# Patient Record
Sex: Female | Born: 1950 | Race: White | Hispanic: No | Smoking: Never smoker
Health system: Southern US, Community
[De-identification: ages and names within clinical notes are randomized; demographics above are authoritative.]

## PROBLEM LIST (undated history)

## (undated) DIAGNOSIS — I10 Essential (primary) hypertension: Secondary | ICD-10-CM

## (undated) DIAGNOSIS — E119 Type 2 diabetes mellitus without complications: Secondary | ICD-10-CM

## (undated) DIAGNOSIS — K222 Esophageal obstruction: Secondary | ICD-10-CM

## (undated) DIAGNOSIS — K219 Gastro-esophageal reflux disease without esophagitis: Secondary | ICD-10-CM

## (undated) DIAGNOSIS — F419 Anxiety disorder, unspecified: Secondary | ICD-10-CM

## (undated) DIAGNOSIS — L719 Rosacea, unspecified: Secondary | ICD-10-CM

## (undated) DIAGNOSIS — K635 Polyp of colon: Secondary | ICD-10-CM

## (undated) DIAGNOSIS — M1990 Unspecified osteoarthritis, unspecified site: Secondary | ICD-10-CM

## (undated) DIAGNOSIS — M810 Age-related osteoporosis without current pathological fracture: Secondary | ICD-10-CM

## (undated) DIAGNOSIS — E785 Hyperlipidemia, unspecified: Secondary | ICD-10-CM

## (undated) DIAGNOSIS — Z8542 Personal history of malignant neoplasm of other parts of uterus: Secondary | ICD-10-CM

## (undated) DIAGNOSIS — R001 Bradycardia, unspecified: Secondary | ICD-10-CM

## (undated) DIAGNOSIS — Z950 Presence of cardiac pacemaker: Secondary | ICD-10-CM

## (undated) HISTORY — DX: Esophageal obstruction: K22.2

## (undated) HISTORY — PX: APPENDECTOMY: SHX54

## (undated) HISTORY — DX: Hyperlipidemia, unspecified: E78.5

## (undated) HISTORY — PX: LAPAROSCOPIC TOTAL HYSTERECTOMY: SUR800

## (undated) HISTORY — DX: Polyp of colon: K63.5

## (undated) HISTORY — DX: Age-related osteoporosis without current pathological fracture: M81.0

## (undated) HISTORY — DX: Gastro-esophageal reflux disease without esophagitis: K21.9

## (undated) HISTORY — DX: Rosacea, unspecified: L71.9

## (undated) HISTORY — DX: Personal history of malignant neoplasm of other parts of uterus: Z85.42

## (undated) HISTORY — DX: Anxiety disorder, unspecified: F41.9

## (undated) HISTORY — DX: Presence of cardiac pacemaker: Z95.0

## (undated) HISTORY — DX: Essential (primary) hypertension: I10

## (undated) HISTORY — DX: Type 2 diabetes mellitus without complications: E11.9

## (undated) HISTORY — DX: Bradycardia, unspecified: R00.1

## (undated) HISTORY — DX: Unspecified osteoarthritis, unspecified site: M19.90

## (undated) HISTORY — PX: TUBAL LIGATION: SHX77

## (undated) HISTORY — PX: LYMPH NODE DISSECTION: SHX5087

## (undated) HISTORY — PX: OTHER SURGICAL HISTORY: SHX169

## (undated) HISTORY — PX: CHOLECYSTECTOMY: SHX55

---

## 2000-02-29 ENCOUNTER — Other Ambulatory Visit: Admission: RE | Admit: 2000-02-29 | Discharge: 2000-02-29 | Payer: Self-pay | Admitting: Family Medicine

## 2001-04-23 ENCOUNTER — Other Ambulatory Visit: Admission: RE | Admit: 2001-04-23 | Discharge: 2001-04-23 | Payer: Self-pay | Admitting: Family Medicine

## 2002-09-01 ENCOUNTER — Other Ambulatory Visit: Admission: RE | Admit: 2002-09-01 | Discharge: 2002-09-01 | Payer: Self-pay | Admitting: Family Medicine

## 2004-08-29 ENCOUNTER — Inpatient Hospital Stay (HOSPITAL_COMMUNITY): Admission: AD | Admit: 2004-08-29 | Discharge: 2004-08-31 | Payer: Self-pay | Admitting: Cardiology

## 2004-08-30 ENCOUNTER — Encounter: Payer: Self-pay | Admitting: Cardiovascular Disease

## 2004-08-30 HISTORY — PX: TRANSTHORACIC ECHOCARDIOGRAM: SHX275

## 2005-01-03 ENCOUNTER — Ambulatory Visit: Payer: Self-pay | Admitting: Internal Medicine

## 2005-11-02 ENCOUNTER — Ambulatory Visit: Payer: Self-pay | Admitting: Internal Medicine

## 2006-05-09 ENCOUNTER — Ambulatory Visit: Payer: Self-pay | Admitting: Internal Medicine

## 2006-07-31 ENCOUNTER — Ambulatory Visit: Payer: Self-pay | Admitting: Internal Medicine

## 2006-11-06 ENCOUNTER — Ambulatory Visit: Payer: Self-pay | Admitting: Internal Medicine

## 2007-04-21 ENCOUNTER — Ambulatory Visit: Payer: Self-pay | Admitting: Internal Medicine

## 2007-07-23 ENCOUNTER — Ambulatory Visit: Payer: Self-pay | Admitting: Internal Medicine

## 2007-09-22 ENCOUNTER — Other Ambulatory Visit: Admission: RE | Admit: 2007-09-22 | Discharge: 2007-09-22 | Payer: Self-pay | Admitting: Obstetrics & Gynecology

## 2007-10-15 ENCOUNTER — Ambulatory Visit: Admission: RE | Admit: 2007-10-15 | Discharge: 2007-10-15 | Payer: Self-pay | Admitting: Gynecologic Oncology

## 2007-10-21 ENCOUNTER — Ambulatory Visit: Payer: Self-pay | Admitting: Internal Medicine

## 2007-11-11 ENCOUNTER — Ambulatory Visit (HOSPITAL_COMMUNITY): Admission: RE | Admit: 2007-11-11 | Discharge: 2007-11-12 | Payer: Self-pay | Admitting: Obstetrics & Gynecology

## 2007-11-11 ENCOUNTER — Encounter: Payer: Self-pay | Admitting: Gynecologic Oncology

## 2007-12-02 ENCOUNTER — Ambulatory Visit: Admission: RE | Admit: 2007-12-02 | Discharge: 2007-12-02 | Payer: Self-pay | Admitting: Gynecology

## 2008-01-21 ENCOUNTER — Ambulatory Visit: Payer: Self-pay | Admitting: Internal Medicine

## 2008-02-17 ENCOUNTER — Ambulatory Visit: Admission: RE | Admit: 2008-02-17 | Discharge: 2008-02-17 | Payer: Self-pay | Admitting: Gynecologic Oncology

## 2008-02-17 ENCOUNTER — Encounter: Payer: Self-pay | Admitting: Gynecologic Oncology

## 2008-02-17 ENCOUNTER — Other Ambulatory Visit: Admission: RE | Admit: 2008-02-17 | Discharge: 2008-02-17 | Payer: Self-pay | Admitting: Gynecologic Oncology

## 2008-04-21 ENCOUNTER — Ambulatory Visit: Payer: Self-pay | Admitting: Internal Medicine

## 2008-06-23 ENCOUNTER — Other Ambulatory Visit: Admission: RE | Admit: 2008-06-23 | Discharge: 2008-06-23 | Payer: Self-pay | Admitting: Obstetrics & Gynecology

## 2008-08-18 ENCOUNTER — Ambulatory Visit: Payer: Self-pay | Admitting: Internal Medicine

## 2008-10-19 ENCOUNTER — Other Ambulatory Visit: Admission: RE | Admit: 2008-10-19 | Discharge: 2008-10-19 | Payer: Self-pay | Admitting: Gynecologic Oncology

## 2008-10-19 ENCOUNTER — Encounter: Payer: Self-pay | Admitting: Gynecologic Oncology

## 2008-10-19 ENCOUNTER — Ambulatory Visit: Admission: RE | Admit: 2008-10-19 | Discharge: 2008-10-19 | Payer: Self-pay | Admitting: Gynecologic Oncology

## 2008-11-02 ENCOUNTER — Ambulatory Visit: Payer: Self-pay | Admitting: Internal Medicine

## 2009-02-02 ENCOUNTER — Ambulatory Visit: Payer: Self-pay | Admitting: Internal Medicine

## 2009-03-08 ENCOUNTER — Ambulatory Visit (HOSPITAL_COMMUNITY): Admission: RE | Admit: 2009-03-08 | Discharge: 2009-03-08 | Payer: Self-pay | Admitting: Gastroenterology

## 2009-04-01 ENCOUNTER — Encounter (INDEPENDENT_AMBULATORY_CARE_PROVIDER_SITE_OTHER): Payer: Self-pay

## 2009-05-04 ENCOUNTER — Ambulatory Visit: Payer: Self-pay | Admitting: Internal Medicine

## 2009-05-09 ENCOUNTER — Encounter: Payer: Self-pay | Admitting: Internal Medicine

## 2009-05-12 ENCOUNTER — Other Ambulatory Visit: Admission: RE | Admit: 2009-05-12 | Discharge: 2009-05-12 | Payer: Self-pay | Admitting: Obstetrics & Gynecology

## 2009-08-03 ENCOUNTER — Ambulatory Visit: Payer: Self-pay | Admitting: Internal Medicine

## 2009-08-03 ENCOUNTER — Encounter: Payer: Self-pay | Admitting: Internal Medicine

## 2009-08-23 ENCOUNTER — Encounter: Payer: Self-pay | Admitting: Internal Medicine

## 2009-10-27 ENCOUNTER — Other Ambulatory Visit: Admission: RE | Admit: 2009-10-27 | Discharge: 2009-10-27 | Payer: Self-pay | Admitting: Gynecologic Oncology

## 2009-10-27 ENCOUNTER — Encounter: Payer: Self-pay | Admitting: Gynecologic Oncology

## 2009-10-27 ENCOUNTER — Encounter: Payer: Self-pay | Admitting: Internal Medicine

## 2009-10-27 ENCOUNTER — Ambulatory Visit: Admission: RE | Admit: 2009-10-27 | Discharge: 2009-10-27 | Payer: Self-pay | Admitting: Gynecologic Oncology

## 2009-11-04 ENCOUNTER — Ambulatory Visit: Payer: Self-pay | Admitting: Internal Medicine

## 2009-11-04 DIAGNOSIS — I10 Essential (primary) hypertension: Secondary | ICD-10-CM | POA: Insufficient documentation

## 2009-11-04 DIAGNOSIS — Z950 Presence of cardiac pacemaker: Secondary | ICD-10-CM

## 2009-12-15 ENCOUNTER — Ambulatory Visit (HOSPITAL_COMMUNITY): Admission: RE | Admit: 2009-12-15 | Discharge: 2009-12-15 | Payer: Self-pay | Admitting: Family Medicine

## 2010-02-01 ENCOUNTER — Ambulatory Visit: Payer: Self-pay | Admitting: Internal Medicine

## 2010-02-10 ENCOUNTER — Encounter: Payer: Self-pay | Admitting: Internal Medicine

## 2010-05-03 ENCOUNTER — Ambulatory Visit: Payer: Self-pay | Admitting: Internal Medicine

## 2010-05-16 ENCOUNTER — Other Ambulatory Visit: Admission: RE | Admit: 2010-05-16 | Discharge: 2010-05-16 | Payer: Self-pay | Admitting: Obstetrics & Gynecology

## 2010-05-22 ENCOUNTER — Encounter: Payer: Self-pay | Admitting: Internal Medicine

## 2010-08-03 ENCOUNTER — Ambulatory Visit: Payer: Self-pay | Admitting: Internal Medicine

## 2010-08-30 ENCOUNTER — Encounter: Payer: Self-pay | Admitting: Internal Medicine

## 2010-10-26 ENCOUNTER — Encounter: Payer: Self-pay | Admitting: Internal Medicine

## 2010-10-26 ENCOUNTER — Ambulatory Visit
Admission: RE | Admit: 2010-10-26 | Discharge: 2010-10-26 | Payer: Self-pay | Source: Home / Self Care | Admitting: Gynecologic Oncology

## 2010-11-28 ENCOUNTER — Ambulatory Visit: Payer: Self-pay | Admitting: Internal Medicine

## 2011-01-16 NOTE — Cardiovascular Report (Signed)
Summary: Office Visit Remote   Office Visit Remote   Imported By: Roderic Ovens 06/01/2010 16:14:54  _____________________________________________________________________  External Attachment:    Type:   Image     Comment:   External Document

## 2011-01-16 NOTE — Letter (Signed)
Summary: Remote Device Check  [employer], Main Office  [address]   [place], [place] [address]   Phone: [phone]  Fax: [phone]     August 30, 2010 MRN: [id number]   [facility] [address] [place], [place]  [address]   Dear Ms. Morency,   Your remote transmission was recieved and reviewed by your physician.  All diagnostics were within normal limits for you.   __X____Your next office visit is scheduled for:    November 2011 with Dr Ladona Ridgel. Please call our office to schedule an appointment.    Sincerely,  Vella Kohler

## 2011-01-16 NOTE — Letter (Signed)
Summary: Remote Device Check  [employer], Main Office  [address]   [place], [place] [address]   Phone: [phone]  Fax: [phone]     May 22, 2010 MRN: [id number]   [facility] [address] [address] [place], [place]  [address]   Dear Ms. Adler,   Your remote transmission was recieved and reviewed by your physician.  All diagnostics were within normal limits for you.  __X___Your next transmission is scheduled for:    08-03-2010 .  Please transmit at any time this day.  If you have a wireless device your transmission will be sent automatically.   Sincerely,  Vella Kohler

## 2011-01-16 NOTE — Cardiovascular Report (Signed)
Summary: Office Visit Remote   Office Visit Remote   Imported By: Roderic Ovens 09/05/2010 14:06:11  _____________________________________________________________________  External Attachment:    Type:   Image     Comment:   External Document

## 2011-01-16 NOTE — Letter (Signed)
Summary: Remote Device Check  [employer], Main Office  [address]   [place], [place] [address]   Phone: [phone]  Fax: [phone]     February 10, 2010 MRN: [id number]   [facility] [place] [place], [place]  [address]   Dear Ms. Baysinger,   Your remote transmission was recieved and reviewed by your physician.  All diagnostics were within normal limits for you.  __X___Your next transmission is scheduled for:    May 03, 2010.  Please transmit at any time this day.  If you have a wireless device your transmission will be sent automatically.     Sincerely,  Proofreader

## 2011-01-16 NOTE — Consult Note (Signed)
Summary: [place] Consultation Report  [place] Consultation Report   Imported By: Earl Many 11/07/2010 15:39:29  _____________________________________________________________________  External Attachment:    Type:   Image     Comment:   External Document

## 2011-01-16 NOTE — Cardiovascular Report (Signed)
Summary: Office Visit Remote   Office Visit Remote   Imported By: Roderic Ovens 02/13/2010 11:10:58  _____________________________________________________________________  External Attachment:    Type:   Image     Comment:   External Document

## 2011-01-18 NOTE — Assessment & Plan Note (Signed)
Summary: pc2 medtronic  Medications Added DEXILANT 60 MG CPDR (DEXLANSOPRAZOLE) once daily BYSTOLIC 5 MG TABS (NEBIVOLOL HCL) Take one tablet by mouth once daily. CRESTOR 10 MG TABS (ROSUVASTATIN CALCIUM) Take one tablet by mouth daily.        Visit Type:  Follow-up Primary Provider:  Rudi Heap, MD   History of Present Illness: [name] returns today for followup.  She is a 60 yo woman with a h/o recurrent syncope and HTN who underwent PPM several yrs. ago. She has had no recurrent syncopal episodes since her PPM was placed though she does occaisionally get dizzy and lightheaded.  No c/p or sob. She does note some indigestion when she bends over.  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   Review of Systems  The patient denies chest pain, syncope, dyspnea on exertion, and peripheral edema.    Vital Signs:  Patient profile:   60 year old female Height:      64 inches Weight:      158 pounds BMI:     27.22 Pulse rate:   68 / minute BP sitting:   112 / 60  (right arm)  Vitals Entered By: Laurance Flatten CMA (November 28, 2010 3:15 PM)  Physical Exam  General:  Well developed, well nourished, in no acute distress.  HEENT: normal Neck: supple. No JVD. Carotids 2+ bilaterally no bruits Cor: RRR no rubs, gallops or murmur Lungs: CTA. Well healed PPM incision. Ab: soft, nontender. nondistended. No HSM. Good bowel sounds Ext: warm. no cyanosis, clubbing or edema Neuro: alert and oriented. Grossly nonfocal. affect pleasant    PPM Specifications Following MD:  Lewayne Bunting, MD     PPM Vendor:  Medtronic     PPM Model Number:  [id number]      PPM Serial Number:  [id number] PPM DOI:  08/30/2004     PPM Implanting MD:  Lewayne Bunting, MD  Lead 1    Location: RA     DOI: 08/30/2004     Model #: [id number]     Serial #: [id number]     Status: active Lead 2    Location: RV     DOI: 08/30/2004     Model #: [id number]     Serial #: [id number]     Status: active   Indications:  Syncope; Bradycardia with pauses    PPM Follow Up Remote Check?  No Battery Voltage:  2.76 V     Battery Est. Longevity:  4 years     Pacer Dependent:  No       PPM Device Measurements Atrium  Amplitude: 2.8 mV, Impedance: 408 ohms, Threshold: 0.5 V at 0.4 msec Right Ventricle  Amplitude: 11.20 mV, Impedance: 486 ohms, Threshold: 0.75 V at 0.4 msec  Episodes MS Episodes:  0     Percent Mode Switch:  0     Coumadin:  No Ventricular High Rate:  3     Atrial Pacing:  0.2%     Ventricular Pacing:  0.3%  Parameters Mode:  DDD     Lower Rate Limit:  50     Upper Rate Limit:  1340 Paced AV Delay:  200     Sensed AV Delay:  200 Next Remote Date:  03/01/2011     Next Cardiology Appt Due:  11/17/2011 Tech Comments:  No parameter changes.  Device function normal.   3VHR episodes 2-3 seconds, SVT.  [facility] transmissions every 3 months.  ROV 1 year with Dr. Ladona Ridgel.   C hecked by industry. Altha Harm, LPN  November 28, 2010 3:22 PM  MD Comments:  Agree with above.   Impression & Recommendations:  Problem # 1:  CARDIAC PACEMAKER IN SITU (ICD-V45.01) Her device is working normally. Will recheck in several months.  Problem # 2:  ESSENTIAL HYPERTENSION, BENIGN (ICD-401.1) Her pressure is well controlled.  continue meds as below and maintain a low sodium diet. Her updated medication list for this problem includes:    Bystolic 5 Mg Tabs (Nebivolol hcl) .Marland Kitchen... Take one tablet by mouth once daily.  Patient Instructions: 1)  Your physician wants you to follow-up in: 12 months with DrTaylor  You will receive a reminder letter in the mail two months in advance. If you don't receive  a letter, please call our office to schedule the follow-up appointment. Prescriptions: BYSTOLIC 5 MG TABS (NEBIVOLOL HCL) Take one tablet by mouth once daily.  #90 x 3   Entered by:   Laurance Flatten CMA   Authorized by:   Laren Boom, MD, [facility]   Signed by:   Laurance Flatten CMA on 11/28/2010   Method used:   Faxed to ...       [facility] (mail-order)             , [place]         Ph: [phone]       Fax: [phone]   RxID:   [id number]

## 2011-01-18 NOTE — Cardiovascular Report (Signed)
Summary: Office Visit   Office Visit   Imported By: Roderic Ovens 12/06/2010 09:28:32  _____________________________________________________________________  External Attachment:    Type:   Image     Comment:   External Document

## 2011-03-01 ENCOUNTER — Encounter: Payer: Self-pay | Admitting: Internal Medicine

## 2011-03-01 ENCOUNTER — Encounter (INDEPENDENT_AMBULATORY_CARE_PROVIDER_SITE_OTHER): Payer: BC Managed Care – PPO

## 2011-03-01 DIAGNOSIS — I498 Other specified cardiac arrhythmias: Secondary | ICD-10-CM

## 2011-03-18 ENCOUNTER — Encounter: Payer: Self-pay | Admitting: *Deleted

## 2011-05-01 NOTE — Op Note (Signed)
[name], [name]                 ACCOUNT NO.:  [id number]   MEDICAL RECORD NO.:  [id number]          PATIENT TYPE:  AMB   LOCATION:  ENDO                         [facility]ACILITY[facility]  [facility]   PHYSICIAN:  Petra Kuba, M.D.    DATE OF BIRTH:  03-Mar-1951   DATE OF PROCEDURE:  03/08/2009  DATE OF DISCHARGE:                               OPERATIVE REPORT   PROCEDURE:  Colonoscopy.   INDICATIONS:  The patient with a history of medium sized polyp with high-  grade dysplasia.  I want to confirm completely removed and no other  lesions.  Consent was signed after the risks, benefits, methods and  options thoroughly discussed multiple times in the past.   MEDICATIONS USED:  Fentanyl 75 mcg, Versed 6 mg.   PROCEDURE:  Rectal inspection as pertinent for external hemorrhoids,  small.  Digital exam was negative.  The video pediatric colonoscope was  inserted and with some difficulty due to a tortuous sigmoid.  With  abdominal pressure it was able to be advanced to the cecum.  No  abnormalities were seen on insertion.  The prep was adequate.  There was  some liquid stool that required washing and suctioning.  The cecum was  identified by the appendiceal orifice and the ileocecal valve.  The  scope was slowly withdrawn.  The cecum and ascending were normal.  In  the transverse, two possible post polypectomy scars were seen without  residual polyp.  One seemed to be a little more proximal and the other  was distal and both appeared normal without signs of residual polyps.  The scope was further withdrawn.  No left-sided abnormalities were seen  as we slowly withdrew back to the rectum.  Once back in the rectum  anorectal posterior and retroflexion confirmed some small hemorrhoids.  The scope was straightened and readvanced a short way up the left side  of the colon.  Air was suctioned and the scope removed.  The patient  tolerated the procedure well.  There was no obvious immediate  complications.   ENDOSCOPIC DIAGNOSES:  1. Internal and external small hemorrhoids.  2. Tortuous sigmoid.  3. Two different areas of scars from probable previous polypectomies      in the transverse without residual polyp.  4. Otherwise within normal limits to the cecum.   PLAN:  I have asked to see him back p.r.n.  Otherwise, repeat colon  screening in 3 years.           ______________________________  Petra Kuba, M.D.     MEM/MEDQ  D:  03/08/2009  T:  03/08/2009  Job:  [id number]   cc:   [name], M.D.  Fax: [phone]

## 2011-05-01 NOTE — Letter (Signed)
October 21, 2007    Cleda Mccreedy, MD  [address]  [place], [place]  [address]   RE:  MAGUIRE, KILLMER  MRN:  [id number]  /  DOB:  18-Feb-1951   Dear Dr. Duard Brady,   Today I saw [name] in my EP clinic for additional followup.  She has a history of bradycardia and a pacemaker implantation and note  from your consultation report from October that she has subsequently  been diagnosed with endometrial cancer.  She is apparently scheduled to  undergo a hysterectomy.  I have known Ms. Strassner for several years having  undergone pacemaker implantation back in 2005.  She has been very stable  and she continues to work a very vigorous [employer] job and has no  chest pain or shortness of breath and tolerates her fairly difficult and  strenuous work without problem.   While there are certainly no risk operations, I think her risk for  cardiovascular complications from your pending hysterectomy are quite  low.  She does have a pacemaker even though she is not pacemaker  dependent.  From my perspective, little if anything need to be done to  her pacemaker prior to the surgery.  You could certainly have the  anesthesiologist place a magnet on this device at the time of surgery  which would minimize any undersensing of the device.  However, because she is not pacemaker dependent this would probably not  be necessary.  At the present time I do not feel that she would benefit  that she would benefit by undergoing additional stress testing and feel  like she can proceed with surgery.  Please do not hesitate to contact me  for any questions regarding Ms. Moritz.    Sincerely,      Doylene Canning. Ladona Ridgel, MD  Electronically Signed    GWT/MedQ  DD: 10/21/2007  DT: 10/22/2007  Job #: [id number]

## 2011-05-01 NOTE — Consult Note (Signed)
[name], [name]                 ACCOUNT NO.:  [id number]   MEDICAL RECORD NO.:  [id number]          PATIENT TYPE:  OUT   LOCATION:  GYN                          FACILITY[facility]  [facility]   PHYSICIAN:  Paola A. Duard Brady, MD    DATE OF BIRTH:  Mar 04, 1951   DATE OF CONSULTATION:  10/15/2007  DATE OF DISCHARGE:                                 CONSULTATION   HISTORY OF PRESENT ILLNESS:  [name] is a 60 year old gravida 1, para 1  whose been menopausal for about 3-4 years.  She had an episode of  vaginal bleeding about a month ago that lasted for about 10 days with  small cramps and small clots.  It felt as if she was having her period,  and that she had anterior thigh pain and low back pain.  She was seen by  Dr. Despina Hidden.  At that time, biopsy was performed that revealed a grade 1  endometrioid adenocarcinoma.  Apparently, at that visit, the patient  also had ultrasounds performed.  I do not have the luxury of those  records as they were not provided for [place] pre-consultation.  She states  that since she since her biopsy, she has continued to have a small  amount of spotting, but has not had as many small clots or the pain that  she had prebiopsy.  She does complain of constipation.  It has been a  longstanding issue.  She will have bowel movements from every day to  every 3 days.  She has never taken any stool softener.  She states it is  not quite that bad.  She denies any narrowing or change in the caliber  her stools or bleeding.  She has never had a colonoscopy.  She does not  know if she has done any stool cards.   PAST MEDICAL HISTORY:  1. She had a pacer put in secondary to 10 second heart positive.  2. She has gastroesophageal reflux disease.   MEDICATIONS:  Nexium, Toprol, Zoloft.   ALLERGIES:  None.   PAST SURGICAL HISTORY:  1. Appendectomy via laparotomy for a rupture.  2. Tonsillectomy.  3. Tubal ligation.  4. NSVD times one.  5. Laparoscopic cholecystectomy.   SOCIAL HISTORY:   She denies tobacco or alcohol.  She is single.  She  works in the [employer].   FAMILY HISTORY:  Her sister had breast cancer in her 57s.   HEALTH MAINTENANCE:  Her mammogram was last 2 years ago.  She had a  recent Pap smear.  She does not know the results.  She has never had a  colonoscopy.   PHYSICAL EXAMINATION:  VITAL SIGNS:  Weight 150 pounds, height 5 feet 4  inches, blood pressure 110/80, well-nourished, well-developed female in  no acute distress.  NECK:  Supple with no lymphadenopathy, no thyromegaly.  LUNGS:  Clear to auscultation bilaterally.  CARDIOVASCULAR:  Regular rate and rhythm.  ABDOMEN:  Shows a well-healed vertical infraumbilical midline incision.  She also has cholecystectomy incisions and an umbilical incision from a  tubal ligation.  Abdomen is soft, nontender,  nondistended.  No palpable  mass or positive megaly.  There is no evidence of any incisional  hernias.  Groins are negative for adenopathy.  EXTREMITIES:  No edema.  PELVIC:  External genitalia is within normal limits.  The vagina is  somewhat atrophic.  The cervix is visualized with a physiologic  discharge.  No visible lesions.  Bimanual examination:  Corpus is  retroflexed.  Cervix is palpably normal.  The corpus of normal size,  shape and consistency.  There are no adnexal masses.   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  Discussion was had with the patient today  regarding need for surgery which she accepts.  We did discuss  laparoscopic surgery and appropriate staging.  She states that an  ultrasound performed at Dr. Forestine Chute office revealed some abnormality, but  she does not recall what it is at this time.  Based on her clinical  examination, I do think that she would be a laparoscopic candidate.  I  discussed with her about that being at the clinic.  They will review our  surgical schedule in triage her surgery so that it can be  done  laparoscopically.  Risks and benefits of surgery including bleeding,  infection, injury to surrounding organs and thromboembolic disease were  discussed with the patient she wishes to proceed.  We will contact her  with dates.  Of note, we will have to insure that she has preoperative  clearance from a cardiac standpoint preoperatively.      Paola A. Duard Brady, MD  Electronically Signed     PAG/MEDQ  D:  10/15/2007  T:  10/15/2007  Job:  [id number]   cc:   Telford Nab, R.N.  [address]  [place], [place] [address]   Doylene Canning. Ladona Ridgel, MD  [address]  [address]  [place]  [place] [address]   Dr. Terrilee Croak, M.D.  Fax: [phone]

## 2011-05-01 NOTE — Assessment & Plan Note (Signed)
[facility]                         ELECTROPHYSIOLOGY OFFICE NOTE   Stephanie, PALMISANO                        MRN:          [id number]  DATE:10/21/2007                            DOB:          [birth year]    [name] returns today for followup.  She is a very pleasant, middle-  aged woman with a history of symptomatic bradycardia and hypertension,  who is status post pacemaker implantation several years ago.  She  returns today for followup.  She has been recently diagnosed with post  __________  vaginal bleeding, and endometrial cancer, and is scheduled  to undergo hysterectomy in the next few weeks.  The patient has done  well, other than her problems with vaginal bleeding.  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  She has had no  syncope.   MEDICATIONS INCLUDE:  1. Zoloft 25 a day.  2. Nexium 40 a day.  3. Toprol 12.5 mg daily.   PHYSICAL EXAM:  Notable for her being a pleasant, middle-aged woman, in  no acute distress.  Her blood pressure today was 140/80, pulse was 84 and regular, the  respirations were 18, the weight was 153 pounds.  HEENT EXAM:  Normocephalic and atraumatic.  Pupils equal and round.  The  oropharynx is moist.  The sclerae are anicteric.  NECK:  Revealed no jugular venous distention.  There is no thyromegaly.  The trachea is midline.  Carotids are 2+ and symmetric.  LUNGS:  Clear bilaterally to auscultation, no wheezes, rales or rhonchi  were present.  There is no increased work of breathing.  CARDIOVASCULAR EXAM:  Revealed a regular rate and rhythm with normal S1  and S2.  There are no murmurs, rubs or gallops present.  EXTREMITIES:  Demonstrated no cyanosis, clubbing or edema.  The pulses  were 2+ and symmetric.   Interrogation of her pacemaker demonstrated a Medtronic Kappa 900 with P  and R-waves of 3 and 8,  respectively.  The impedance is 420 in the  atrium, 490 in the ventricle.  The threshold is 0.5 at 0.4 in both the  atrium and the ventricle.  The battery voltage was 2.78 volts.  There  were no mode-switching episodes.  She had four rate-drop responses.   IMPRESSION:  1. Symptomatic bradycardia.  2. Borderline hypertension.  3. Status post pacemaker insertion.  4. Recently diagnosed endometrial cancer.   DISCUSSION:  Overall, Ms. Aumiller is stable.  I think she is at low  cardiovascular risk for proceeding with hysterectomy.  I will plan to  see her back in the office in a year, sooner should she have additional  problems.     Doylene Canning. Ladona Ridgel, MD  Electronically Signed    GWT/MedQ  DD: 10/21/2007  DT: 10/22/2007  Job #: [id number]   cc:   Rejeana Brock A. Duard Brady, MD  Paulita Cradle, NP

## 2011-05-01 NOTE — Consult Note (Signed)
[name], [name]                 ACCOUNT NO.:  [id number]   MEDICAL RECORD NO.:  [id number]          PATIENT TYPE:  OUT   LOCATION:  GYN                          FACILITY:  [facility]   PHYSICIAN:  De Blanch, M.D.DATE OF BIRTH:  Feb 19, 1951   DATE OF CONSULTATION:  12/02/2007  DATE OF DISCHARGE:                                 CONSULTATION   CHIEF COMPLAINT:  Postoperative follow-up.   INTERVAL HISTORY:  Since hospital discharge, the patient has done well.  She had a bout of flu recently with low grade fever and queasiness to  her stomach.   From a surgical point of view, she reports all of her scars are healing  well and are not very painful.  She had a minimal amount of pink vaginal  discharge last week.  She has no other GI or GU symptoms.   FINAL PATHOLOGY:  Showed that she had a stage IB endometrial cancer.  All lymph nodes and washings and adnexa were negative.   PHYSICAL EXAMINATION:  VITAL SIGNS:  Weight 148 pounds, blood pressure  122/86.  GENERAL:  The patient is healthy white female in no acute distress.  ABDOMEN:  Soft, nontender.  All laparoscopy incisions are healing very  nicely.  PELVIC:  EG/BUS, vagina, bladder, urethra are normal.  Vaginal cuff is  healing well.  On bimanual exam, there is some postoperative induration.  No tenderness and no masses.   IMPRESSION:  Stage IB adenocarcinoma of the endometrium status post  total laparoscopic hysterectomy, bilateral salpingo-oophorectomy and  pelvic lymphadenectomy.  Her prognostic features are good, and no  additional therapy would be recommended.  The patient has been given the  okay to return to work on January 12.  She will return to see [place] in 3  months, and then we will begin alternating visits with Dr. Donia Pounds, M.D.  Electronically Signed     DC/MEDQ  D:  12/02/2007  T:  12/03/2007  Job:  [id number]   cc:   Telford Nab, R.N.  [address]  [place], [place]  [address]   Doylene Canning. Ladona Ridgel, MD  [address]  [address]  [place]  [place] [address]   Birdena Jubilee   Lazaro Arms, M.D.  Fax: [phone]

## 2011-05-01 NOTE — Consult Note (Signed)
NAMEHENREITTA, SPITTLER                 ACCOUNT NO.:  [id number]   MEDICAL RECORD NO.:  [id number]          PATIENT TYPE:  OUT   LOCATION:  GYN                          FACILITY:  [facility]   PHYSICIAN:  Paola A. Duard Brady, MD    DATE OF BIRTH:  1951/03/17   DATE OF CONSULTATION:  10/19/2008  DATE OF DISCHARGE:                                 CONSULTATION   [name] is a very pleasant 60 year old who underwent laparoscopic BSO  and appropriate staging in November 2008 for a 1B grade 1 endometrioid  adenocarcinoma.  She had less than 10% myometrial invasion, no  lymphovascular space involvement and negative lymph nodes.  I last saw  her in March of 2009 at which time her Pap smear was negative.  There  was a lesion noted that was biopsied that revealed mildly inflamed  granulation tissue.  No malignancy identified.  She states she was seen  by Dr. Despina Hidden in July of 2009 with a negative exam and negative Pap smear  and comes in today for followup.  She is overall doing quite well.  She  really denies any complaints.  She did undergo a colonoscopy as we have  been suggesting since 2008.  She had several polyps.  One was  premalignant.  She has a scheduled followup colonoscopy in March of  2010.  She is very thankful that we were so persistent in asking her to  have her colonoscopy done.  She is otherwise doing quite well.  She  denies any change in her bowel or bladder habits, any nausea, vomiting,  fevers, chills, chest pain, shortness of breath.  She denies any change  in her stool caliber, any vaginal bleeding.   MEDICATIONS:  Are the same.  Lovaza is the only addition added secondary  to high triglycerides.   PHYSICAL EXAMINATION:  Weight 155-1/2 pounds.  Well-nourished, well-  developed female in no acute distress.  NECK:  Supple.  There is no lymphadenopathy, no thyromegaly.  LUNGS:  Clear to auscultation bilaterally.  CARDIOVASCULAR EXAM:  Regular rate and rhythm.  ABDOMEN:  Shows  well-healed surgical incisions.  There is no evidence of  any incisional hernias.  Abdomen is soft, nontender, nondistended.  No  palpable masses or hepatosplenomegaly.  Groins are negative for  adenopathy.  EXTREMITIES:  There is no edema.  PELVIC:  External genitalia is within normal limits.  The vagina is  atrophic.  The vaginal cuff is visualized.  There are no visible  lesions.  ThinPrep Pap was submitted without difficulty.  Bimanual  examination reveals no masses or nodularity.  Rectal confirms.   ASSESSMENT:  60 year old with stage I B grade 1 endometrial carcinoma  who with no evidence of recurrent disease.   PLAN:  To follow up results from her Pap smear from today.  She will see  Dr. Despina Hidden in 6 months and return to see [place] in 1 year.  She was given  reminder cards.      Paola A. Duard Brady, MD  Electronically Signed     PAG/MEDQ  D:  10/19/2008  T:  10/19/2008  Job:  [id number]   cc:   Telford Nab, R.N.  [address]  [place], [place] [address]   Doylene Canning. Ladona Ridgel, MD  [address]  [address]  [place]  [place] [address]   [name], M.D.  Fax: [phone]   Lazaro Arms, M.D.  Fax: [phone]   Paulita Cradle

## 2011-05-01 NOTE — Assessment & Plan Note (Signed)
[facility]                         ELECTROPHYSIOLOGY OFFICE NOTE   [name], [name]                        MRN:          [id number]  DATE:11/02/2008                            DOB:          October 01, 1951    [name] returns today for followup.  She is a very pleasant middle-aged  woman with symptomatic bradycardia, status post pacemaker insertion.  She has a history of pauses of greater than 10 seconds.  She returns  today for followup.  The patient has been stable today.  She denies  chest pain or shortness of breath.  She has had no recurrent syncopal  episodes.   MEDICINES:  1. Zoloft 25 mg daily.  2. Nexium 40 a day.  3. Toprol 12.5 daily.  4. Lovaza 2 tablets daily.  5. Citracal daily.   PHYSICAL EXAMINATION:  GENERAL:  She is a pleasant middle-aged woman in  no distress.  VITAL SIGNS:  Blood pressure was 139/80, the pulse 67 and regular,  respirations were 18, and the weight was 152 pounds.  NECK:  No jugular venous distention.  LUNGS:  Clear bilaterally to auscultation.  No wheezes, rales, or  rhonchi are present.  CARDIOVASCULAR:  Regular rate rhythm.  Normal S1 and S2.  ABDOMEN:  Soft and nontender.  EXTREMITIES:  Demonstrate no edema.   Interrogation of pacemaker demonstrates Medtronic kappa 900.  The P-  waves are 4.  The R-waves are 80.  The impedance was 480 in the atrium  and 486 in the ventricle, the threshold is 0.25 at 0.4 in the A and 0.75  at 0.4 in the V.  She was pacing in the A and in the V, 0.2% of the  time.  Today, we reprogrammed her device to maximize battery longevity  by decreasing her outputs to 1.75 at 0.27 in the A and 2 at 0.4 in the  V.   IMPRESSION:  1. Symptomatic bradycardia.  2. Status post pacemaker.   DISCUSSION:  Overall, [name] is stable.  Her pacemaker is working  normally.  She has had no recurrent syncope.  Her blood pressure is well-  controlled.  I will see her back in the office in 1  year.     [name]. Ladona Ridgel, MD  Electronically Signed    GWT/MedQ  DD: 11/02/2008  DT: 11/03/2008  Job #: [id number]

## 2011-05-01 NOTE — Consult Note (Signed)
[name], [name]                 ACCOUNT NO.:  [id number]   MEDICAL RECORD NO.:  [id number]          PATIENT TYPE:  OUT   LOCATION:  GYN                          FACILITY:  [facility]   PHYSICIAN:  Paola A. Duard Brady, MD    DATE OF BIRTH:  1951/07/27   DATE OF CONSULTATION:  02/17/2008  DATE OF DISCHARGE:                                 CONSULTATION   [name] is a very pleasant, 60 year old who underwent laparoscopic  hysterectomy, BSO and appropriate staging November 2008 for what ended  up being on final pathology a IB1 endometrioid adenocarcinoma with 0/9  negative lymph nodes.  Tumor had less than 10% myometrial invasion with  no lymphovascular space involvement noted.  She was seen by Dr. Kemper DurieSharol Given December 16 for a postoperative check at which time there was  some normal postoperative induration but no tenderness or masses.  She  comes in today for follow-up.  She is overall doing quite well and  denies any complaints.   REVIEW OF SYSTEMS:  She has any chest pain, shortness of breath, nausea,  vomiting, fevers, chills, headaches, visual changes.  She denies any  significant change in her bowel or bladder habits, any abdominal  bloating, pain, or vaginal bleeding.   Medication list is reviewed and includes Nexium, Toprol and Zoloft.   HEALTH MAINTENANCE:  She has not yet had a mammogram. Her last one was  almost 3 years ago. She states she will start doing them at work. She  has never had a colonoscopy.   PHYSICAL EXAMINATION:  Weight 150 pounds, blood pressure 134/80, height  5 foot 4, well-nourished, well-developed female in no acute distress.  NECK:  Supple. There is no lymphadenopathy, no thyromegaly.  LUNGS:  Clear to auscultation bilaterally.  CARDIOVASCULAR:  Regular rate and rhythm.  ABDOMEN:  Shows well-healed laparoscopy skin incisions.  Abdomen is  soft, nontender, nondistended. There are no palpable masses or  hepatosplenomegaly. Groins are negative for  adenopathy.  EXTREMITIES:  There is no edema.  PELVIC:  External genitalia is within normal limits.  The vagina is  slightly atrophic.  The vaginal cuff is visualized.  There is a small  area of granulation tissue on the left side of the vaginal cuff.  After  obtaining the patient's verbal consent, the area was biopsied away with  a tissue biopsy forceps.  Silver nitrate was applied.  Bimanual  examination reveals no masses or nodularity.  Rectal confirms.   ASSESSMENT:  A 56-year with a stage I B grade 1 endometrial carcinoma  who has no clinical evidence of recurrent disease.  It does appear to me  that she has granulation tissue at the top of the vagina.   PLAN:  1. Will followup on the results of her Pap smear from today. Will also      send the small area of granulation tissue that was biopsied away to      pathology. Will notify the patient's of the results. Provided this      is all normal, she      return to  see Dr. Despina Hidden in 4 months and return to see [place] in 8      months.  2. She will discuss routine colon cancer screening with Dr. Christell Constant.      She sees Paulita Cradle at that office.  Her questions were      elicited and answered to her satisfaction. She will return as      above.      Paola A. Duard Brady, MD  Electronically Signed     PAG/MEDQ  D:  02/17/2008  T:  02/18/2008  Job:  [id number]   cc:   Telford Nab, R.N.  [address]  [place], [place] [address]   Doylene Canning. Ladona Ridgel, MD  [address]  [address]  [place]  [place] [address]   [name], M.D.  Fax: [phone]   Lazaro Arms, M.D.  Fax: [phone]   Paulita Cradle, NP

## 2011-05-01 NOTE — Op Note (Signed)
[name], [name]                 ACCOUNT NO.:  [id number]   MEDICAL RECORD NO.:  [id number]          PATIENT TYPE:  OIB   LOCATION:  0098                         FACILITY:  [facility]   PHYSICIAN:  Paola A. Duard Brady, MD    DATE OF BIRTH:  [birth year]   DATE OF PROCEDURE:  11/11/2007  DATE OF DISCHARGE:                               OPERATIVE REPORT   PREOPERATIVE DIAGNOSIS:  Grade 1 endometrial carcinoma.   POSTOPERATIVE DIAGNOSIS:  Grade 1 endometrial carcinoma.   PROCEDURE:  Total laparoscopic hysterectomy, BSO, bilateral pelvic and  right-sided periaortic lymph node dissection.   SURGEON:  Paola A. Duard Brady, MD and Roseanna Rainbow, MD.   ASSISTANT:  None.   ANESTHESIA:  General.   ESTIMATED BLOOD LOSS:  75 mL.   URINE OUTPUT:  425 mL.   IV FLUIDS:  2500 ounces.   SPECIMENS:  Washings, cervix, uterus, bilateral tubes and ovaries,  bilateral pelvic and right-sided periaortic lymph nodes.   DISPOSITION OF PATHOLOGY SPECIMEN:  To pathology.   OPERATIVE FINDINGS:  Small bowel densely adherent to the anterior  abdominal wall.  The right ovary with a small 2 cm simple cyst densely  adherent to the ovarian fossae, a 2-cm anterior myoma.  Adhesive disease  of the bladder to the surface of the uterus and abdominal wall.  Otherwise normal anatomy and no evidence of any extrauterine spread.   The patient was taken to the operating room, placed in supine position.  The arms were tucked to her side with appropriate precautions and to the  patient's comfort.  General anesthesia was induced.  Intubation was very  difficult secondary to the patient having a short chin and small mouth.  Anesthesia will send the patient a note regarding this, as well as she  will be asked to wear a Med Alert bracelet. After general anesthesia was  induced and intubation was successfully performed, the patient was  placed in the dorsal lithotomy position with all appropriate  precautions.  The abdomen  was prepped in the usual sterile fashion, the  perineum and vagina were prepped in the usual sterile fashion.  A Foley  catheter was inserted into the bladder under sterile conditions.  A  sterile speculum was placed into the vagina.  The cervix was identified,  it was grasped with a single-tooth tenaculum. The cervix was dilated  without difficulty. The ZUMI with the small KOH-ring was placed in the  usual fashion.  The patient was then draped. Time-out was performed to  confirm surgeons procedure, allergies and antibiotic status.   Local 0.25% plain Marcaine was injected into her prior transverse  infraumbilical incision.  The skin incision made with a knife and  carried down to the underlying fascia using curved Mayo's.  The fascia  was identified, grasped with Kocher clamps, tented and entered.  The  fascial edges were secured with a #0 Vicryl on a UR-6. The Hasson was  then placed into the abdomen.  The abdomen was then insufflated with CO2  gas.  At this point and all points throughout case, the patient's peak  pressures increased over 15 mmHg.  Under direct visualization, a 5-mm  trocar was placed 2 cm above and medial to the ASIS.  A 10-mm trocar was  placed in the suprapubic region, again under direct visualization.  Using careful dissection with sharp scissors, the small bowel adhesive  to the anterior abdominal wall was taken down without difficulty.  A  small fascial defect was created in order to not injure the small bowel.  The small bowel was then allowed to fall  into its normal anatomic  position.  A second 5-mm trocar was placed in the right lower quadrant  again under direct visualization.  Washings were obtained.   The small bowel was folded upon its mesentery.  The root of the aorta  and the small bowel mesentery were identified.  A peritoneal incision  was made overlying the right common iliac artery ascending towards  duodenum.  The retroperitoneal spaces were  identified.  The  retroperitoneal resection was performed to the psoas muscles and the  psoas tendon was identified.  The ureter was identified and mobilized  out of the surgical field.  Using pinpoint cautery and sharp dissection,  the nodal bundle encompassing the right periaortic lymph nodes was  removed.  It was then removed from the 10/12 port site.  The area was  noted to be hemostatic.   Our attention was then drawn to the pelvis.  The round ligament on the  patient's left side was then cauterized using monopolar cautery.  The  anterior and posterior leaves of the broad ligament were opened.  The  ureter was identified, a window was made between the IP and the ureter.  The IP was coagulated and transected. The posterior peritoneum was then  taken down to level of the KOH-ring and the uterine artery was  skeletonized posteriorly.  We then proceeded anteriorly.  The adhesive  disease of the bladder to the surface of the fibroid and the surface of  the uterus as well as the adhesive disease of the bladder to the  anterior abdominal wall were taken down with meticulous sharp  dissection.  The bladder flap was then created to a level below the KOH-  ring. Again skeletonization of the uterine vessels anteriorly was  performed.  The uterine vessels on the patient's left side were then  coagulated using Kleppinger's.  Our attention was then drawn to the  patient's right side in a similar fashion, the round ligament was  cauterized and transected. The anterior and posterior leaves of the  broad ligament were then opened.  The ureter was identified and a window  was made between the IP and the ureter.  The IP was coagulated x2 using  bipolar cautery and transected. The ovary and fallopian tube were  densely adherent to the ovarian fossa.  Sharp dissection was taken to  take this off the peritoneum to the level of the KOH-ring.  The ureter  was noted to be well lateral and inferior to the  area of dissection.  We  then completed the bladder flap anteriorly using sharp dissection.  The  uterine vessels were then skeletonized.  They were then transected.  The  pneumo balloon was then insufflated with 120 mL of air.  The colpotomy  was performed in the usual fashion.  The uterus, cervix, tubes and  ovaries were then delivered through the vagina.   The vaginal cuff was closed using four figure-of-eight sutures of #0  Vicryl in an Endostitch. Our attention  was then drawn onto the right  pelvic sidewall.  The paravesical space was then opened in the usual  fashion as was the pararectal space.  Similarly on the left side, the  paravesical space was then opened.  The nodal bundle extending over the  external iliac artery and vein was taken down using sharp dissection  from the level of the circumflex iliac to the bifurcation.  The  obturator nerve was then identified and the nodal bundle was removed  from the obturator space. There was one slightly enlarged node measuring  approximately a centimeter and half.  The nodal basin was noted to be  hemostatic. Our attention was then drawn to the patient's right pelvic  sidewall.  In a similar fashion, the nodal extending over the external  iliac artery and vein was taken down.  Bilaterally the general femoral  nerves were noticed and were spared.  The obturator nerve was then  identified and the nodal bundle superior to the obturator nerve was  removed. The specimen was placed in an EndoCatch bag and delivered  through the 10/12 port.  Copious irrigation of the pelvis proceeded.  The vaginal cuff was hemostatic as were all vascular pedicles. There was  no bleeding noted in the nodal basins.  We then reinspected the  periaortic site and again there was hemostasis with no active bleeding.  The small bowel was inspected and there is no bleeding.  All pedicles  were then inspected under low flow and they were noted to be hemostatic.  The  ports were removed under direct visualization. All CO2 gas was  removed from the patient's abdomen. The fascia of the infraumbilical  port was then resecured with a running suture of #0 Vicryl.  A subcu  suture of #0 Vicryl was placed in the suprapubic port.  The skin was  closed using 4-0 Vicryl.  Steri-Strips and Band-Aids were placed.   The patient was taken to the recovery room in stable condition.  All  instrument, needle and Ray-Tec counts were correct x2.      Paola A. Duard Brady, MD  Electronically Signed     PAG/MEDQ  D:  11/11/2007  T:  11/11/2007  Job:  [id number]   cc:   Telford Nab, R.N.  [address]  [place], [place] [address]   Doylene Canning. Ladona Ridgel, MD  [address]  [address]  [place]  [place] [address]   Paulita Cradle, NP   Lazaro Arms, M.D.  Fax: [phone]   Roseanna Rainbow, M.D.  Fax: [phone]

## 2011-05-04 NOTE — Consult Note (Signed)
[name], [name]                           ACCOUNT NO.:  [id number]   MEDICAL RECORD NO.:  [id number]                   PATIENT TYPE:  INP   LOCATION:  2910                                 FACILITY:  [facility]   PHYSICIAN:  Doylene Canning. Ladona Ridgel, M.D.               DATE OF BIRTH:  10/11/1951   DATE OF CONSULTATION:  DATE OF DISCHARGE:                                   CONSULTATION   INDICATION FOR CONSULTATION:  Evaluation of recurrent syncope with pauses of  over 10 seconds.   CHIEF COMPLAINT:  I passed out.   HISTORY OF PRESENT ILLNESS:  The patient is a 60 year old woman who has a  history of hypertension and gastroesophageal reflux disease.  She gives a  remote history of syncope occurring several years ago.  This was preceded by  some nausea.  The patient relates over the last several days that she had  initial episode of syncope  approximately two days ago.  She had this  witnessed by her significant other and she was found to be initially  unresponsive with some seizure-like activity.  The patient awakened and felt  nauseated.  The paramedics were called and she declined transfer to the  hospital.  Several hours later she had a recurrent episode of syncope.  This  time, she stated that she was in her usual state of health while lying down  and had a bit of nausea and got up to get some water.  She felt warm all  over and developed a frank syncopal episode, hitting her head with a small  laceration.  She was taken to the emergency department where she was  admitted to the hospital.  Yesterday morning while she was eating breakfast  and lying in bed, she again became fairly nauseated and hot and had an  episode of bradycardia including a 10 second pause.  The nurses who  witnessed this recorded seizure-like activity.  On return of her heart rate  was initially in the 30s, and then gradually came back to normal.  She is  transferred for additional evaluation.  The patient denies any  family  history of sudden cardiac death.  She does not usually have dizziness or  lightheadedness.  She denies chest pain.   PAST MEDICAL HISTORY:  Her past medical history is notable for longstanding  hypertension.  She has a history of appendicitis with gangrene status post  surgical removal and debridement.  She has a history of cholecystectomy.  She has a history of abdominal pain in the past, not associated with  syncope.   FAMILY HISTORY:  Family history is notable for a mother and father both  developing coronary disease in their 62s.  There is no history of heart  rhythm disturbances.   SOCIAL HISTORY:  The patient is presently employed at [employer].  She denies  tobacco or ethanol use.   REVIEW OF  SYSTEMS:  As noted in the HPI.  She denies vision or hearing  problems.  She denies cough, hemoptysis or night sweats.  She denies nausea,  vomiting, diarrhea or constipation except as previously noted.  She denies  arthritic complaints.  She denies any skin changes or neurological problems  except as previously noted.   PHYSICAL EXAMINATION:  VITAL SIGNS:  On physical examination, her blood  pressure was 130/84, pulse was 72 and regular, respirations were 16.  HEENT:  Exam is normocephalic and atraumatic.  Pupils equal and round.  Oropharynx  was moist.  Sclerae anicteric.  NECK:  Reveals no jugular venous distention.  There is no thyromegaly,  trachea was midline.  CARDIOVASCULAR:  Exam revealed a regular rate and rhythm with normal S1 and  S2.  There were no murmurs, rubs or gallops.  LUNGS:  Clear bilaterally to auscultation.  There were no wheezes, rales or  rhonchi.  ABDOMEN:  Exam was soft, nontender, nondistended.  There was no  organomegaly.  EXTREMITIES:  Demonstrated no cyanosis, clubbing or edema.  The pulses were  2+ and symmetric.  NEUROLOGICAL:  Alert and oriented times three with cranial nerves 2-12  grossly intact.  Strength was 5/5 and symmetric.   EKG  demonstrates normal sinus rhythm with normal axis and intervals.   IMPRESSION:  1.  Recurrent syncope with documented profound pauses of up to 10 seconds.  2.  Sinus bradycardia.  3.  Hypertension.  4.  Gastroesophageal reflux disease.  5.  Depression, on Zoloft.   DISCUSSION:  I discussed treatment options with the patient and her family.  One option would be to start beta blocker therapy alone versus beta blocker  therapy in conjunction with a permanent pacemaker.  Because of her very  profound cardiac inhibition, pacemaker insertion would be a consideration in  this patient in conjunction with beta blocker therapy.  Because she has had  these episodes while she is lying down, not only when she is standing, I  think there is a very strong component of cardioinhibitor syncope.  I have  instructed her that permanent pacemaker may not cure her of the problem, but  will hopefully help prevent additional syncopal episodes or at least make  them less severe.  The risk, benefits, goals and expectations of pacemaker  insertion have been discussed with the patient and she wishes to proceed.                                               Doylene Canning. Ladona Ridgel, M.D.    GWT/MEDQ  D:  08/30/2004  T:  08/30/2004  Job:  [id number]   cc:   Carolynn Comment, M.D.   [name], M.D.  [address] [place]  [place] [address]  Fax: [phone]

## 2011-05-04 NOTE — Discharge Summary (Signed)
NAMETAMMATHA, COBB                           ACCOUNT NO.:  [id number]   MEDICAL RECORD NO.:  [id number]                   PATIENT TYPE:  INP   LOCATION:  2910                                 FACILITY:  [facility]   PHYSICIAN:  Jesse Sans. Wall, M.D.                DATE OF BIRTH:  Feb 08, 1951   DATE OF ADMISSION:  08/29/2004  DATE OF DISCHARGE:  08/31/2004                                 DISCHARGE SUMMARY   ADDENDUM   She has also been sent home on Toprol XL 25 mg daily.      Maple Mirza, P.A.                    Thomas C. Wall, M.D.    GM/MEDQ  D:  08/31/2004  T:  08/31/2004  Job:  [id number]   cc:   Doylene Canning. Ladona Ridgel, M.D.   Jonelle Sidle, M.D. [facility]   [name], M.D.  [address] [place]  [place] [address]  Fax: [phone]

## 2011-05-04 NOTE — Op Note (Signed)
NAMESANJUANITA, CONDREY                           ACCOUNT NO.:  [id number]   MEDICAL RECORD NO.:  [id number]                   PATIENT TYPE:  INP   LOCATION:  2910                                 FACILITY:  [facility]   PHYSICIAN:  Doylene Canning. Ladona Ridgel, M.D.               DATE OF BIRTH:  1951-04-05   DATE OF PROCEDURE:  08/30/2004  DATE OF DISCHARGE:                                 OPERATIVE REPORT   PROCEDURE PERFORMED:  Insertion of a dual chamber pacemaker.   INDICATIONS FOR PROCEDURE:  Symptomatic bradycardia with pauses of up to 10  seconds.   I:  INTRODUCTION:  The patient is a 60 year old woman with a history of  recurrent syncope who was admitted to the hospital after having two syncopal  episodes.  During the hospital stay, the patient was noted to have a pause  of 10 seconds associated with convulsive syncope.  She is now referred for  permanent pacemaker insertion.   II:  PROCEDURE:  After informed consent was obtained, the patient was taken  to the diagnostic EP lab in a fasting state.  After the usual preparation  and draping, intravenous Fentanyl and Midazolam were given for sedation.  30  mL of lidocaine was infiltrated into the left infraclavicular region.  A 5  cm incision was carried out over this region and electrocautery utilized to  dissect down to the fascial plane.  The left subclavian vein was then  punctured x 2 and the Medtronic model 5076, 52 cm, active fixation pacing  lead, serial number [id number], was advanced into the right ventricle.  Next, the Medtronic model 5076, 45 cm, active fixation pacing lead, serial  number [id number], was advanced into the right atrium.  Mapping was carried  out in the right ventricle and at the final site near the RV apex, the R  waves measured 9 millivolts and the pacing impedance was 0.6 volts at 0.5  milliseconds with a pacing impedance of 911 ohms.  10 volt pacing in the  right ventricle did not stimulate the diaphragm.  With the  ventricular lead  in satisfactory position, attention was turned to the placement of the  atrial lead.  Mapping was carried out in the right atrium where the final  site P waves measured 2 millivolts and the atrial threshold 0.6 volts at 0.5  milliseconds.  The pacing impedance was again 548 ohms.  Again, 10 volt  pacing did not stimulate the diaphragm.  With both atrial and ventricular  leads in satisfactory position, they were secured to the subpectoralis  fascia with figure-of-eight silk suture.  In addition, the sewing sleeve was  secured with silk suture.  Electrocautery was utilized to make a  subcutaneous pocket.  The Medtronic Kappa 900 model number [id number] was  connected to the atrial and ventricular pacing leads and placed in the  subcutaneous pocket.  The generator was secured with a  silk suture.  Additional Kanamycin irrigation was utilized to irrigate the incision and  the incision was then closed with a layer of 2-0 Vicryl followed by a layer  of 3-0 Vicryl followed by a layer of 4-0 Vicryl.  Benzoin was painted on the  skin, Steri-Strips were applied, and a pressure dressing placed.  The  patient was returned to his room in satisfactory condition.   III:  COMPLICATIONS:  There were no immediate procedure complications.   IV:  RESULTS:  Successful implantation of a Medtronic dual chamber pacemaker  in a patient with symptomatic bradycardia and syncope.                                               Doylene Canning. Ladona Ridgel, M.D.    GWT/MEDQ  D:  08/30/2004  T:  08/30/2004  Job:  [id number]   cc:   Jonelle Sidle, M.D. [facility]   Dr. Christell Constant

## 2011-05-04 NOTE — Discharge Summary (Signed)
NAMEGURTHA, PICKER                           ACCOUNT NO.:  [id number]   MEDICAL RECORD NO.:  [id number]                   PATIENT TYPE:  INP   LOCATION:  2910                                 FACILITY:  [facility]   PHYSICIAN:  Doylene Canning. Ladona Ridgel, M.D.               DATE OF BIRTH:  02-Dec-1951   DATE OF ADMISSION:  08/29/2004  DATE OF DISCHARGE:  08/31/2004                                 DISCHARGE SUMMARY   DISCHARGE DIAGNOSES:  1.  Syncope, most likely neurally mediated with predominance if      cardioinhibition with pauses up to 10 seconds in length.  2.  Recent history of chest pain and dyspnea.   SECONDARY DIAGNOSES:  1.  Hypertension.  2.  Gastroesophageal reflux disease.  3.  Depression.   PROCEDURE:  August 30, 2004, implantation of DDD permanent pacemaker,  Medtronic Kappa [id number], serial number [id number], Dr. Doylene Canning. Ladona Ridgel.   DISCHARGE DISPOSITION:  Ms. Averitt is suitable for discharge, post-procedure  day #1, after implantation of Medtronic dual-chamber pacemaker.  Post-  implantation interrogation shows that all values are within normal limits,  no changes made.  Her incision is healing nicely.  Chest x-ray shows that  leads are appropriate in placement, no pneumothorax and leads are intact.  The patient has had some sinus rhythm with PVCs in the last 16 hours, but no  pauses.  The patient has not had nausea or vomiting and slept well  overnight.  The patient also underwent 2-D echocardiogram, August 30, 2004.  Ejection fraction was 55% to 65%.  Left ventricular function was  normal.  Trivial mitral regurgitation.   DISCHARGE MEDICATIONS:  Patient discharges on the following medications:  1.  Zestril 5 mg daily.  2.  Zoloft 25 mg daily.  3.  Prempro 2.5 mg daily.  4.  Nexium 20 mg daily.  5.  Zyrtec 10 mg daily.  6.  For incisional pain, the patient will take Tylenol 325 mg 1-2 tabs every      4-6 hours as needed.   ACTIVITY:  She is to return to work in 3-4  weeks and she has been given a  permission slip for this.  She will have the following activity for the left  upper extremity and has been described in detail to the patient:  She is  basically to keep her left arm by her side quietly for the next 4 days.  She  is asked not to drive for the next 10 days and not to engage in heavy  lifting or exertion for the next 6-8 weeks.   WOUND CARE:  She is to keep the incision dry for the next week, sponge-bathe  until Wednesday, September 06, 2004, then she can re-commence showering.   FOLLOWUP:  She has followup as recorded below:  1.  [facility], [facility], [address] [place],  9:15 in the morning on Monday, September 18, 2004.  She will have an      exercise stress study at [facility] on Tuesday, September 19, 2004,      at 7:30, nothing to eat after midnight Monday night.  2.  She will have a followup visit with Dr. Jonelle Sidle, [facility] office      of [facility], Thursday, September 21, 2004, at 1:45 in the      afternoon and she will see Dr. Ladona Ridgel, January 03, 2005 at 9:30 in the      morning.   BRIEF HISTORY:  Ms. Ortloff is a 60 year old female.  She has a history of  syncope 3 times since the evening of August 28, 2004; this was over a 24-  hour period.  Each time, the patient felt warm with the warmth localized  to the head, also an overall funny feeling, a drifting, dreamy feeling, then  her eyes rolled back, per witnesses, and she lost consciousness; this  usually would last only about 30 seconds.  The first time was 10 p.m. in the  evening prior to this admission.  She had all the symptoms mentioned above  and followed by nausea and vomiting on recovery of consciousness.  EMS was  called and they told the patient that she probably had a viral illness.  The  patient went back to bed, but woke up at 2 o'clock in the morning on the  morning of this admission.  She got up, went into the kitchen,  again her  head felt warm.  She started to have that drifty feeling, then she fell,  hurting her left elbow.  Once again on recovery, she had nausea and  vomiting; EMS at this time took her to [place].  At 8:55 in the morning at  [facility], lying in bed, she felt a warm, drifty, funny feeling,  lost consciousness; this time, the monitor showed a pause of 10 seconds;  once again, nausea and vomiting following cardiac arrhythmias.  The patient  also gives a history of epigastric-located pain through to the back for the  last month; with this, she has no diaphoresis, nausea or radiation to the  extremities or the neck, no dyspnea.  The pain is not exertional, not  positional, it comes and goes intermittently.  The patient did have a stress  test several years ago which she says was normal.  She had this done in  the setting of chest pain and dyspnea, but at that time, the symptoms  overall spontaneously resolved.  The patient will be transferred to [facility] for evaluation by electrophysiology with diagnosis of syncope  with documented pauses.   HOSPITAL COURSE:  Patient arrived at [facility] on the evening of  August 29, 2004.  She was placed on a monitor.  She had no further  episodes of syncope.  She was seen in consultation by Dr. Ladona Ridgel, who said  that this was most likely a neurally mediated syncope with predominance of  cardioinhibition.  Options including beta blocker therapy or permanent  pacemaker placement were discussed with the patient; she elected to have  permanent pacemaker placement; this was done August 30, 2004.  She had no  complications post-procedurally, no recurrence of syncope.  She is ready for  discharge with the extensive followup as dictated.      Maple Mirza, P.A.  Doylene Canning. Ladona Ridgel, M.D.    GM/MEDQ  D:  08/31/2004  T:  08/31/2004  Job:  [id number]  cc:   Jonelle Sidle, M.D. [facility]   [name],  M.D.  [address] [place]  [place] [address]  Fax: [phone]

## 2011-05-31 ENCOUNTER — Encounter: Payer: Self-pay | Admitting: *Deleted

## 2011-06-04 ENCOUNTER — Encounter: Payer: Self-pay | Admitting: *Deleted

## 2011-06-10 ENCOUNTER — Other Ambulatory Visit: Payer: Self-pay | Admitting: Internal Medicine

## 2011-06-11 ENCOUNTER — Ambulatory Visit (INDEPENDENT_AMBULATORY_CARE_PROVIDER_SITE_OTHER): Payer: BC Managed Care – PPO | Admitting: *Deleted

## 2011-06-11 DIAGNOSIS — R001 Bradycardia, unspecified: Secondary | ICD-10-CM

## 2011-06-11 DIAGNOSIS — Z950 Presence of cardiac pacemaker: Secondary | ICD-10-CM

## 2011-06-11 DIAGNOSIS — I498 Other specified cardiac arrhythmias: Secondary | ICD-10-CM

## 2011-06-11 DIAGNOSIS — R55 Syncope and collapse: Secondary | ICD-10-CM

## 2011-06-19 ENCOUNTER — Encounter: Payer: Self-pay | Admitting: *Deleted

## 2011-06-19 NOTE — Progress Notes (Signed)
Pacer remote check  

## 2011-09-13 ENCOUNTER — Ambulatory Visit (INDEPENDENT_AMBULATORY_CARE_PROVIDER_SITE_OTHER): Payer: BC Managed Care – PPO | Admitting: *Deleted

## 2011-09-13 DIAGNOSIS — R001 Bradycardia, unspecified: Secondary | ICD-10-CM

## 2011-09-13 DIAGNOSIS — I498 Other specified cardiac arrhythmias: Secondary | ICD-10-CM

## 2011-09-13 DIAGNOSIS — Z950 Presence of cardiac pacemaker: Secondary | ICD-10-CM

## 2011-09-13 DIAGNOSIS — R55 Syncope and collapse: Secondary | ICD-10-CM

## 2011-09-14 ENCOUNTER — Other Ambulatory Visit: Payer: Self-pay | Admitting: Internal Medicine

## 2011-09-14 ENCOUNTER — Encounter: Payer: Self-pay | Admitting: Internal Medicine

## 2011-09-17 LAB — REMOTE PACEMAKER DEVICE
AL IMPEDENCE PM: 404 Ohm
ATRIAL PACING PM: 0
BATTERY VOLTAGE: 2.75 V
RV LEAD IMPEDENCE PM: 556 Ohm

## 2011-09-19 ENCOUNTER — Encounter: Payer: Self-pay | Admitting: *Deleted

## 2011-09-20 NOTE — Progress Notes (Signed)
Pacer remote check  

## 2011-09-25 LAB — COMPREHENSIVE METABOLIC PANEL
ALT: 18
AST: 19
Albumin: 3.8
Alkaline Phosphatase: 74
CO2: 30
Calcium: 9.3
Chloride: 106
Glucose, Bld: 104 — ABNORMAL HIGH
Potassium: 3.4 — ABNORMAL LOW
Sodium: 143

## 2011-09-25 LAB — CBC
HCT: 34.5 — ABNORMAL LOW
HCT: 38.6
MCHC: 35.1
MCHC: 35.4
MCV: 90.5
MCV: 90.5
Platelets: 231
RBC: 4.27
WBC: 12.6 — ABNORMAL HIGH

## 2011-09-25 LAB — BASIC METABOLIC PANEL
BUN: 5 — ABNORMAL LOW
Calcium: 8.7
Creatinine, Ser: 0.64
GFR calc non Af Amer: >60
Potassium: 4
Sodium: 141

## 2011-09-25 LAB — TYPE AND SCREEN
ABO/RH(D): O POS
Antibody Screen: NEGATIVE

## 2011-09-25 LAB — ABO/RH: ABO/RH(D): O POS

## 2011-09-25 LAB — DIFFERENTIAL
Basophils Absolute: 0
Basophils Relative: 0
Monocytes Relative: 4
Neutro Abs: 4.8

## 2011-11-21 ENCOUNTER — Encounter: Payer: Self-pay | Admitting: Internal Medicine

## 2011-11-23 ENCOUNTER — Ambulatory Visit (INDEPENDENT_AMBULATORY_CARE_PROVIDER_SITE_OTHER): Payer: BC Managed Care – PPO | Admitting: Internal Medicine

## 2011-11-23 ENCOUNTER — Encounter: Payer: Self-pay | Admitting: Internal Medicine

## 2011-11-23 ENCOUNTER — Encounter (HOSPITAL_COMMUNITY): Payer: BC Managed Care – PPO

## 2011-11-23 VITALS — BP 126/78 | HR 73 | Ht 64.5 in | Wt 160.4 lb

## 2011-11-23 DIAGNOSIS — Z950 Presence of cardiac pacemaker: Secondary | ICD-10-CM

## 2011-11-23 DIAGNOSIS — I10 Essential (primary) hypertension: Secondary | ICD-10-CM

## 2011-11-23 DIAGNOSIS — I459 Conduction disorder, unspecified: Secondary | ICD-10-CM

## 2011-11-23 LAB — PACEMAKER DEVICE OBSERVATION
AL AMPLITUDE: 4 mv
AL IMPEDENCE PM: 394 Ohm
AL THRESHOLD: 0.5 V
ATRIAL PACING PM: 0
BATTERY VOLTAGE: 2.75 V
RV LEAD AMPLITUDE: 15.68 mv
RV LEAD IMPEDENCE PM: 544 Ohm
RV LEAD THRESHOLD: 0.75 V
VENTRICULAR PACING PM: 0

## 2011-11-23 NOTE — Progress Notes (Signed)
HPI [name] returns today for followup. She is a very pleasant 60 year old woman with a history of symptomatic bradycardia, hypertension, and dyslipidemia. She is status post permanent pacemaker insertion. She denies chest pain, shortness of breath, peripheral edema, or syncope. She notes no tenderness or pain in her pacemaker insertion site. She continues to work full-time. No Known Allergies   Current Outpatient Prescriptions  Medication Sig Dispense Refill  . calcium citrate-vitamin D (CITRACAL+D) 315-200 MG-UNIT per tablet Take 1 tablet by mouth daily.        Marland Kitchen dexlansoprazole (DEXILANT) 60 MG capsule Take 60 mg by mouth daily.        . nebivolol (BYSTOLIC) 5 MG tablet Take 5 mg by mouth daily.        Marland Kitchen omega-3 acid ethyl esters (LOVAZA) 1 G capsule Take 2 g by mouth daily.        . rosuvastatin (CRESTOR) 10 MG tablet Take 10 mg by mouth daily.        . sertraline (ZOLOFT) 25 MG tablet Take 25 mg by mouth daily.           Past Medical History  Diagnosis Date  . Symptomatic bradycardia   . History of uterine cancer     ROS:   All systems reviewed and negative except as noted in the HPI.   Past Surgical History  Procedure Date  . Laparoscopic total hysterectomy   . Lymph node dissection     bilateral pelvic and right-sided periaortic  . Status post pacemaker     metronic [phone]  . Transthoracic echocardiogram 08/30/04     No family history on file.   History   Social History  . Marital Status: Single    Spouse Name: N/A    Number of Children: N/A  . Years of Education: N/A   Occupational History  . employed     Scientist, product/process development   Social History Main Topics  . Smoking status: Never Smoker   . Smokeless tobacco: Not on file  . Alcohol Use: No  . Drug Use: Not on file  . Sexually Active: Not on file   Other Topics Concern  . Not on file   Social History Narrative  . No narrative on file     BP 126/78  Pulse 73  Ht 5' 4.5" (1.638 m)  Wt 72.757  kg (160 lb 6.4 oz)  BMI 27.11 kg/m2  Physical Exam:  Well appearing NAD HEENT: Unremarkable Neck:  No JVD, no thyromegally Lungs:  Clear with no wheezes, rales, or rhonchi. Well-healed pacemaker incision. HEART:  Regular rate rhythm, no murmurs, no rubs, no clicks Abd:  soft, positive bowel sounds, no organomegally, no rebound, no guarding Ext:  2 plus pulses, no edema, no cyanosis, no clubbing Skin:  No rashes no nodules Neuro:  CN II through XII intact, motor grossly intact  DEVICE  Normal device function.  See PaceArt for details.   Assess/Plan:

## 2011-11-23 NOTE — Patient Instructions (Signed)
Your physician wants you to follow-up in:  12 months.  You will receive a reminder letter in the mail two months in advance. If you don't receive a letter, please call our office to schedule the follow-up appointment.   

## 2011-11-23 NOTE — Assessment & Plan Note (Signed)
Her device is working normally. We'll plan to recheck in several months. 

## 2011-11-23 NOTE — Assessment & Plan Note (Signed)
Her blood pressure is well controlled. She will continue her current medications and maintain a low-sodium diet. 

## 2011-12-16 ENCOUNTER — Other Ambulatory Visit: Payer: Self-pay | Admitting: Internal Medicine

## 2011-12-17 ENCOUNTER — Other Ambulatory Visit: Payer: Self-pay

## 2011-12-17 MED ORDER — NEBIVOLOL HCL 5 MG PO TABS: 5.0000 mg | ORAL_TABLET | Freq: Every day | ORAL | Status: DC

## 2012-02-15 ENCOUNTER — Other Ambulatory Visit: Payer: Self-pay | Admitting: Internal Medicine

## 2012-02-15 ENCOUNTER — Other Ambulatory Visit: Payer: Self-pay

## 2012-02-15 MED ORDER — NEBIVOLOL HCL 5 MG PO TABS: 5.0000 mg | ORAL_TABLET | Freq: Every day | ORAL | Status: DC

## 2012-02-15 NOTE — Telephone Encounter (Signed)
Pt wants 90 day supply its cheaper

## 2012-02-28 ENCOUNTER — Encounter: Payer: Self-pay | Admitting: Internal Medicine

## 2012-02-28 ENCOUNTER — Ambulatory Visit (INDEPENDENT_AMBULATORY_CARE_PROVIDER_SITE_OTHER): Payer: BC Managed Care – PPO | Admitting: *Deleted

## 2012-02-28 DIAGNOSIS — I495 Sick sinus syndrome: Secondary | ICD-10-CM

## 2012-02-28 DIAGNOSIS — Z950 Presence of cardiac pacemaker: Secondary | ICD-10-CM

## 2012-02-29 LAB — REMOTE PACEMAKER DEVICE
ATRIAL PACING PM: 0
RV LEAD IMPEDENCE PM: 526 Ohm

## 2012-03-07 NOTE — Progress Notes (Signed)
Remote pacer check  

## 2012-03-17 ENCOUNTER — Encounter: Payer: Self-pay | Admitting: *Deleted

## 2012-05-29 ENCOUNTER — Ambulatory Visit (INDEPENDENT_AMBULATORY_CARE_PROVIDER_SITE_OTHER): Payer: BC Managed Care – PPO | Admitting: *Deleted

## 2012-05-29 ENCOUNTER — Encounter: Payer: Self-pay | Admitting: Internal Medicine

## 2012-05-29 DIAGNOSIS — Z950 Presence of cardiac pacemaker: Secondary | ICD-10-CM

## 2012-06-06 LAB — REMOTE PACEMAKER DEVICE
AL AMPLITUDE: 2.8 mv
AL IMPEDENCE PM: 420 Ohm
BATTERY VOLTAGE: 2.74 V
BRDY-0003RV: 130 {beats}/min
RV LEAD AMPLITUDE: 16 mv
RV LEAD IMPEDENCE PM: 554 Ohm
VENTRICULAR PACING PM: 0

## 2012-06-16 ENCOUNTER — Encounter: Payer: Self-pay | Admitting: *Deleted

## 2012-09-08 ENCOUNTER — Ambulatory Visit (INDEPENDENT_AMBULATORY_CARE_PROVIDER_SITE_OTHER): Payer: BC Managed Care – PPO | Admitting: *Deleted

## 2012-09-08 DIAGNOSIS — I498 Other specified cardiac arrhythmias: Secondary | ICD-10-CM

## 2012-09-08 DIAGNOSIS — R001 Bradycardia, unspecified: Secondary | ICD-10-CM

## 2012-09-15 ENCOUNTER — Encounter: Payer: Self-pay | Admitting: Internal Medicine

## 2012-09-15 LAB — REMOTE PACEMAKER DEVICE
AL AMPLITUDE: 2.8 mv
AL IMPEDENCE PM: 408 Ohm
BRDY-0002RV: 50 {beats}/min
RV LEAD IMPEDENCE PM: 508 Ohm
RV LEAD THRESHOLD: 0.875 V

## 2012-09-16 NOTE — Progress Notes (Signed)
Remote pacer check  

## 2012-09-24 ENCOUNTER — Encounter: Payer: Self-pay | Admitting: *Deleted

## 2012-11-20 ENCOUNTER — Encounter: Payer: Self-pay | Admitting: *Deleted

## 2012-11-25 ENCOUNTER — Encounter: Payer: Self-pay | Admitting: Internal Medicine

## 2012-11-25 ENCOUNTER — Ambulatory Visit (INDEPENDENT_AMBULATORY_CARE_PROVIDER_SITE_OTHER): Payer: BC Managed Care – PPO | Admitting: Internal Medicine

## 2012-11-25 VITALS — BP 127/79 | HR 75 | Resp 18 | Ht 64.0 in | Wt 160.0 lb

## 2012-11-25 DIAGNOSIS — I10 Essential (primary) hypertension: Secondary | ICD-10-CM

## 2012-11-25 DIAGNOSIS — Z950 Presence of cardiac pacemaker: Secondary | ICD-10-CM

## 2012-11-25 NOTE — Progress Notes (Signed)
HPI [name] returns today for followup. She is a pleasant 61 yo woman with a h/o symptomatic bradycardia, s/p PPM. She also has a h/o dyslipidemia. In the interim she has done well.  No Known Allergies   Current Outpatient Prescriptions  Medication Sig Dispense Refill  . calcium citrate-vitamin D (CITRACAL+D) 315-200 MG-UNIT per tablet Take 1 tablet by mouth daily.        Marland Kitchen esomeprazole (NEXIUM) 40 MG capsule Take 40 mg by mouth daily before breakfast.      . nebivolol (BYSTOLIC) 5 MG tablet Take 1 tablet (5 mg total) by mouth daily.  90 tablet  6  . omega-3 acid ethyl esters (LOVAZA) 1 G capsule Take 2 g by mouth daily.        . rosuvastatin (CRESTOR) 10 MG tablet Take 10 mg by mouth daily.        . sertraline (ZOLOFT) 25 MG tablet Take 25 mg by mouth daily.        Marland Kitchen dexlansoprazole (DEXILANT) 60 MG capsule Take 60 mg by mouth daily.           Past Medical History  Diagnosis Date  . Symptomatic bradycardia   . History of uterine cancer     ROS:   All systems reviewed and negative except as noted in the HPI.   Past Surgical History  Procedure Date  . Laparoscopic total hysterectomy   . Lymph node dissection     bilateral pelvic and right-sided periaortic  . Status post pacemaker     metronic [phone]  . Transthoracic echocardiogram 08/30/04     No family history on file.   History   Social History  . Marital Status: Single    Spouse Name: N/A    Number of Children: N/A  . Years of Education: N/A   Occupational History  . employed     Scientist, product/process development   Social History Main Topics  . Smoking status: Never Smoker   . Smokeless tobacco: Not on file  . Alcohol Use: No  . Drug Use: Not on file  . Sexually Active: Not on file   Other Topics Concern  . Not on file   Social History Narrative  . No narrative on file     BP 127/79  Pulse 75  Resp 18  Ht 5\' 4"  (1.626 m)  Wt 160 lb (72.576 kg)  BMI 27.46 kg/m2  SpO2 97%  Physical Exam:  Well  appearing middle-aged woman, NAD HEENT: Unremarkable Neck:  No JVD, no thyromegally Lungs:  Clear with no wheezes, rales, or rhonchi. Well-healed pacemaker incision HEART:  Regular rate rhythm, no murmurs, no rubs, no clicks Abd:  soft, positive bowel sounds, no organomegally, no rebound, no guarding Ext:  2 plus pulses, no edema, no cyanosis, no clubbing Skin:  No rashes no nodules Neuro:  CN II through XII intact, motor grossly intact  EKG normal sinus rhythm with left ventricular hypertrophy  DEVICE  Normal device function.  See PaceArt for details.   Assess/Plan:

## 2012-11-25 NOTE — Assessment & Plan Note (Signed)
Her Medtronic dual-chamber pacemaker is working normally today. Pacing and sensing in the atrium and the ventricle are normal. She has approximately 2 years of battery longevity.

## 2012-11-25 NOTE — Assessment & Plan Note (Signed)
Her blood pressure is well controlled. She is instructed to continue her current medical therapy and maintain a low-sodium diet.

## 2012-11-25 NOTE — Patient Instructions (Signed)
Your physician wants you to follow-up in: 12 months with Dr Court Joy will receive a reminder letter in the mail two months in advance. If you don't receive a letter, please call our office to schedule the follow-up appointment.    Remote monitoring is used to monitor your Pacemaker of ICD from home. This monitoring reduces the number of office visits required to check your device to one time per year. It allows [place] to keep an eye on the functioning of your device to ensure it is working properly. You are scheduled for a device check from home on 03/02/13. You may send your transmission at any time that day. If you have a wireless device, the transmission will be sent automatically. After your physician reviews your transmission, you will receive a postcard with your next transmission date.

## 2012-11-26 LAB — PACEMAKER DEVICE OBSERVATION
AL THRESHOLD: 0.5 V
BATTERY VOLTAGE: 2.73 V
RV LEAD AMPLITUDE: 15 mv

## 2013-02-15 ENCOUNTER — Other Ambulatory Visit: Payer: Self-pay | Admitting: Internal Medicine

## 2013-03-02 ENCOUNTER — Encounter: Payer: BC Managed Care – PPO | Admitting: *Deleted

## 2013-03-02 ENCOUNTER — Other Ambulatory Visit: Payer: Self-pay | Admitting: Family Medicine

## 2013-03-03 LAB — COMPLETE METABOLIC PANEL WITH GFR
ALT: 13 U/L (ref 0–35)
AST: 13 U/L (ref 0–37)
Albumin: 4.2 g/dL (ref 3.5–5.2)
Alkaline Phosphatase: 76 U/L (ref 39–117)
BUN: 13 mg/dL (ref 6–23)
CO2: 30 mEq/L (ref 19–32)
Calcium: 9.5 mg/dL (ref 8.4–10.5)
Chloride: 103 mEq/L (ref 96–112)
Creat: 0.64 mg/dL (ref 0.50–1.10)
GFR, Est African American: >89 mL/min
GFR, Est Non African American: >89 mL/min
Glucose, Bld: 109 mg/dL — ABNORMAL HIGH (ref 70–99)
Potassium: 3.9 mEq/L (ref 3.5–5.3)
Sodium: 140 mEq/L (ref 135–145)
Total Bilirubin: 0.4 mg/dL (ref 0.3–1.2)
Total Protein: 6.9 g/dL (ref 6.0–8.3)

## 2013-03-05 LAB — NMR LIPOPROFILE WITH LIPIDS
Cholesterol, Total: 242 mg/dL — ABNORMAL HIGH (ref <200)
HDL Particle Number: 30.4 umol/L — ABNORMAL LOW (ref >=30.5)
HDL Size: 8.7 nm — ABNORMAL LOW (ref >=9.2)
HDL-C: 43 mg/dL (ref >=40)
LDL (calc): 130 mg/dL — ABNORMAL HIGH (ref <100)
LDL Particle Number: 2480 nmol/L — ABNORMAL HIGH (ref <1000)
LDL Size: 20.5 nm — ABNORMAL LOW (ref >20.5)
LP-IR Score: 94 — ABNORMAL HIGH (ref <=45)
Large HDL-P: 1.3 umol/L — ABNORMAL LOW (ref >=4.8)
Large VLDL-P: 22.9 nmol/L — ABNORMAL HIGH (ref <=2.7)
Small LDL Particle Number: 1565 nmol/L — ABNORMAL HIGH (ref <=527)
Triglycerides: 343 mg/dL — ABNORMAL HIGH (ref <150)
VLDL Size: 63.1 nm — ABNORMAL HIGH (ref >=46.6)

## 2013-03-10 ENCOUNTER — Other Ambulatory Visit: Payer: Self-pay | Admitting: Internal Medicine

## 2013-03-10 ENCOUNTER — Ambulatory Visit (INDEPENDENT_AMBULATORY_CARE_PROVIDER_SITE_OTHER): Payer: BC Managed Care – PPO | Admitting: *Deleted

## 2013-03-10 DIAGNOSIS — Z950 Presence of cardiac pacemaker: Secondary | ICD-10-CM

## 2013-03-10 DIAGNOSIS — I498 Other specified cardiac arrhythmias: Secondary | ICD-10-CM

## 2013-03-11 ENCOUNTER — Encounter: Payer: Self-pay | Admitting: *Deleted

## 2013-03-18 LAB — REMOTE PACEMAKER DEVICE
ATRIAL PACING PM: 0
BRDY-0003RV: 130 {beats}/min
RV LEAD THRESHOLD: 0.75 V

## 2013-03-27 ENCOUNTER — Telehealth: Payer: Self-pay | Admitting: Family Medicine

## 2013-03-27 NOTE — Telephone Encounter (Signed)
Copy mailed.

## 2013-04-13 ENCOUNTER — Encounter: Payer: BC Managed Care – PPO | Admitting: *Deleted

## 2013-04-14 ENCOUNTER — Encounter: Payer: Self-pay | Admitting: *Deleted

## 2013-04-15 ENCOUNTER — Encounter: Payer: Self-pay | Admitting: Internal Medicine

## 2013-04-27 ENCOUNTER — Encounter: Payer: Self-pay | Admitting: *Deleted

## 2013-05-21 ENCOUNTER — Other Ambulatory Visit: Payer: Self-pay | Admitting: Gastroenterology

## 2013-06-15 ENCOUNTER — Encounter: Payer: Self-pay | Admitting: Internal Medicine

## 2013-06-15 ENCOUNTER — Ambulatory Visit (INDEPENDENT_AMBULATORY_CARE_PROVIDER_SITE_OTHER): Payer: BC Managed Care – PPO | Admitting: *Deleted

## 2013-06-15 DIAGNOSIS — Z950 Presence of cardiac pacemaker: Secondary | ICD-10-CM

## 2013-06-15 DIAGNOSIS — I498 Other specified cardiac arrhythmias: Secondary | ICD-10-CM

## 2013-08-07 ENCOUNTER — Encounter: Payer: Self-pay | Admitting: Family Medicine

## 2013-08-07 ENCOUNTER — Encounter: Payer: Self-pay | Admitting: *Deleted

## 2013-08-07 ENCOUNTER — Ambulatory Visit (INDEPENDENT_AMBULATORY_CARE_PROVIDER_SITE_OTHER): Payer: BC Managed Care – PPO | Admitting: Family Medicine

## 2013-08-07 VITALS — BP 142/80 | HR 80 | Temp 98.3°F | Wt 165.4 lb

## 2013-08-07 DIAGNOSIS — E785 Hyperlipidemia, unspecified: Secondary | ICD-10-CM

## 2013-08-07 DIAGNOSIS — K222 Esophageal obstruction: Secondary | ICD-10-CM | POA: Insufficient documentation

## 2013-08-07 DIAGNOSIS — M129 Arthropathy, unspecified: Secondary | ICD-10-CM

## 2013-08-07 DIAGNOSIS — I152 Hypertension secondary to endocrine disorders: Secondary | ICD-10-CM | POA: Insufficient documentation

## 2013-08-07 DIAGNOSIS — F419 Anxiety disorder, unspecified: Secondary | ICD-10-CM

## 2013-08-07 DIAGNOSIS — M8580 Other specified disorders of bone density and structure, unspecified site: Secondary | ICD-10-CM | POA: Insufficient documentation

## 2013-08-07 DIAGNOSIS — R5381 Other malaise: Secondary | ICD-10-CM

## 2013-08-07 DIAGNOSIS — D126 Benign neoplasm of colon, unspecified: Secondary | ICD-10-CM

## 2013-08-07 DIAGNOSIS — K635 Polyp of colon: Secondary | ICD-10-CM

## 2013-08-07 DIAGNOSIS — I10 Essential (primary) hypertension: Secondary | ICD-10-CM

## 2013-08-07 DIAGNOSIS — E1159 Type 2 diabetes mellitus with other circulatory complications: Secondary | ICD-10-CM | POA: Insufficient documentation

## 2013-08-07 DIAGNOSIS — M810 Age-related osteoporosis without current pathological fracture: Secondary | ICD-10-CM

## 2013-08-07 DIAGNOSIS — M1990 Unspecified osteoarthritis, unspecified site: Secondary | ICD-10-CM | POA: Insufficient documentation

## 2013-08-07 DIAGNOSIS — F411 Generalized anxiety disorder: Secondary | ICD-10-CM

## 2013-08-07 DIAGNOSIS — E1169 Type 2 diabetes mellitus with other specified complication: Secondary | ICD-10-CM | POA: Insufficient documentation

## 2013-08-07 DIAGNOSIS — Z8542 Personal history of malignant neoplasm of other parts of uterus: Secondary | ICD-10-CM | POA: Insufficient documentation

## 2013-08-07 DIAGNOSIS — K219 Gastro-esophageal reflux disease without esophagitis: Secondary | ICD-10-CM

## 2013-08-07 DIAGNOSIS — Z950 Presence of cardiac pacemaker: Secondary | ICD-10-CM | POA: Insufficient documentation

## 2013-08-07 LAB — POCT CBC
Granulocyte percent: 64.8 %G (ref 37–80)
HCT, POC: 40.5 % (ref 37.7–47.9)
Hemoglobin: 13.2 g/dL (ref 12.2–16.2)
Lymph, poc: 2.6 (ref 0.6–3.4)
MCH, POC: 29.7 pg (ref 27–31.2)
MCHC: 32.7 g/dL (ref 31.8–35.4)
MCV: 90.9 fL (ref 80–97)
MPV: 7.8 fL (ref 0–99.8)
POC Granulocyte: 5.4 (ref 2–6.9)
POC LYMPH PERCENT: 30.5 %L (ref 10–50)
Platelet Count, POC: 223 10*3/uL (ref 142–424)
RBC: 4.5 M/uL (ref 4.04–5.48)
RDW, POC: 13.1 %
WBC: 8.4 10*3/uL (ref 4.6–10.2)

## 2013-08-07 NOTE — Progress Notes (Signed)
Patient ID: [name], female   DOB: 12/04/1951, 62 y.o.   MRN: [id number] SUBJECTIVE: CC: Chief Complaint  Patient presents with  . Follow-up    3 month     HPI: Patient is here for follow up of hyperlipidemia:/HTN/osteoporosis denies Headache;denies Chest Pain;denies weakness;denies Shortness of Breath and orthopnea;denies Visual changes;denies palpitations;denies cough;denies pedal edema;denies symptoms of TIA or stroke;deniesClaudication symptoms. admits to Compliance with medications; Problems with medications.fenofibrate caused fatigue  Past Medical History  Diagnosis Date  . Symptomatic bradycardia   . GERD (gastroesophageal reflux disease)     Hiatal Hernia  . Arthritis     of the neck  . History of uterine cancer   . Hyperlipidemia   . Hypertension   . Osteoporosis   . Anxiety   . Colon polyps   . Esophageal stricture   . Pacemaker    Past Surgical History  Procedure Laterality Date  . Laparoscopic total hysterectomy    . Lymph node dissection      bilateral pelvic and right-sided periaortic  . Status post pacemaker      metronic [phone]  . Transthoracic echocardiogram  08/30/04  . Cholecystectomy    . Appendectomy    . Tubal ligation     History   Social History  . Marital Status: Single    Spouse Name: N/A    Number of Children: N/A  . Years of Education: N/A   Occupational History  . employed     Scientist, product/process development   Social History Main Topics  . Smoking status: Never Smoker   . Smokeless tobacco: Not on file  . Alcohol Use: No  . Drug Use: Not on file  . Sexual Activity: Not on file   Other Topics Concern  . Not on file   Social History Narrative  . No narrative on file   No family history on file. Current Outpatient Prescriptions on File Prior to Visit  Medication Sig Dispense Refill  . BYSTOLIC 5 MG tablet TAKE 1 TABLET (5 MG TOTAL) DAILY  90 tablet  5  . calcium citrate-vitamin D (CITRACAL+D) 315-200 MG-UNIT per tablet Take 1  tablet by mouth daily.        Marland Kitchen esomeprazole (NEXIUM) 40 MG capsule Take 40 mg by mouth daily before breakfast.      . omega-3 acid ethyl esters (LOVAZA) 1 G capsule Take 2 g by mouth daily.        . sertraline (ZOLOFT) 25 MG tablet Take 25 mg by mouth daily.         No current facility-administered medications on file prior to visit.   No Known Allergies  There is no immunization history on file for this patient. Prior to Admission medications   Medication Sig Start Date End Date Taking? Authorizing Provider  BYSTOLIC 5 MG tablet TAKE 1 TABLET (5 MG TOTAL) DAILY 02/15/13  Yes Marinus Maw, MD  calcium citrate-vitamin D (CITRACAL+D) 315-200 MG-UNIT per tablet Take 1 tablet by mouth daily.     Yes Historical Provider, MD  esomeprazole (NEXIUM) 40 MG capsule Take 40 mg by mouth daily before breakfast.   Yes Historical Provider, MD  omega-3 acid ethyl esters (LOVAZA) 1 G capsule Take 2 g by mouth daily.     Yes Historical Provider, MD  sertraline (ZOLOFT) 25 MG tablet Take 25 mg by mouth daily.     Yes Historical Provider, MD     ROS: As above in the HPI. All other systems are stable  or negative.  OBJECTIVE: APPEARANCE:  Patient in no acute distress.The patient appeared well nourished and normally developed. Acyanotic. Waist: VITAL SIGNS:BP 142/80  Pulse 80  Temp(Src) 98.3 F (36.8 C) (Oral)  Wt 165 lb 6.4 oz (75.025 kg)  BMI 28.38 kg/m2 WF  SKIN: warm and  Dry without overt rashes, tattoos and scars  HEAD and Neck: without JVD, Head and scalp: normal Eyes:No scleral icterus. Fundi normal, eye movements normal. Ears: Auricle normal, canal normal, Tympanic membranes normal, insufflation normal. Nose: normal Throat: normal Neck & thyroid: normal  CHEST & LUNGS: Chest wall: normal Lungs: Clear  CVS: Reveals the PMI to be normally located. Regular rhythm, First and Second Heart sounds are normal,  absence of murmurs, rubs or gallops. Peripheral vasculature: Radial  pulses: normal Dorsal pedis pulses: normal Posterior pulses: normal  ABDOMEN:  Appearance: normal Benign, no organomegaly, no masses, no Abdominal Aortic enlargement. No Guarding , no rebound. No Bruits. Bowel sounds: normal  RECTAL: N/A GU: N/A  EXTREMETIES: nonedematous.  MUSCULOSKELETAL:  Spine: normal Joints: intact  NEUROLOGIC: oriented to time,place and person; nonfocal. Strength is normal Sensory is normal Reflexes are normal Cranial Nerves are normal.  Results for orders placed in visit on 08/07/13  POCT CBC      Result Value Range   WBC 8.4  4.6 - 10.2 K/uL   Lymph, poc 2.6  0.6 - 3.4   POC LYMPH PERCENT 30.5  10 - 50 %L   POC Granulocyte 5.4  2 - 6.9   Granulocyte percent 64.8  37 - 80 %G   RBC 4.5  4.04 - 5.48 M/uL   Hemoglobin 13.2  12.2 - 16.2 g/dL   HCT, POC 16.1  09.6 - 47.9 %   MCV 90.9  80 - 97 fL   MCH, POC 29.7  27 - 31.2 pg   MCHC 32.7  31.8 - 35.4 g/dL   RDW, POC 04.5     Platelet Count, POC 223.0  142 - 424 K/uL   MPV 7.8  0 - 99.8 fL    ASSESSMENT: Hyperlipidemia - Plan: CMP14+EGFR, NMR, lipoprofile  Arthritis  Colon polyps  Esophageal stricture  GERD (gastroesophageal reflux disease)  History of uterine cancer  Hypertension - Plan: CMP14+EGFR  Osteoporosis  PPM-Medtronic  Anxiety  Other malaise and fatigue - Plan: POCT CBC, TSH, Folate, Vitamin B12, Vit D  25 hydroxy (rtn osteoporosis monitoring)   PLAN: Orders Placed This Encounter  Procedures  . CMP14+EGFR  . NMR, lipoprofile  . TSH  . Folate  . Vitamin B12  . Vit D  25 hydroxy (rtn osteoporosis monitoring)  . POCT CBC        Dr Woodroe Mode Recommendations  For nutrition information, I recommend books:  1).Eat to Live by Dr Monico Hoar. 2).Prevent and Reverse Heart Disease by Dr Suzzette Righter. 3) Dr Katherina Right Book:  Program to Reverse Diabetes  Exercise recommendations are:  If unable to walk, then the patient can exercise in a chair 3  times a day. By flapping arms like a bird gently and raising legs outwards to the front.  If ambulatory, the patient can go for walks for 30 minutes 3 times a week. Then increase the intensity and duration as tolerated.  Goal is to try to attain exercise frequency to 5 times a week.  If applicable: Best to perform resistance exercises (machines or weights) 2 days a week and cardio type exercises 3 days per week.  Medications Discontinued During This Encounter  Medication Reason  . dexlansoprazole (DEXILANT) 60 MG capsule Change in therapy  . rosuvastatin (CRESTOR) 10 MG tablet Change in therapy    Return in about 4 months (around 12/07/2013) for Recheck medical problems.  Kearstyn Avitia P. Modesto Charon, M.D.

## 2013-08-07 NOTE — Patient Instructions (Addendum)
      Dr Daymeon Fischman's Recommendations  For nutrition information, I recommend books:  1).Eat to Live by Dr Joel Fuhrman. 2).Prevent and Reverse Heart Disease by Dr Caldwell Esselstyn. 3) Dr Neal Barnard's Book:  Program to Reverse Diabetes  Exercise recommendations are:  If unable to walk, then the patient can exercise in a chair 3 times a day. By flapping arms like a bird gently and raising legs outwards to the front.  If ambulatory, the patient can go for walks for 30 minutes 3 times a week. Then increase the intensity and duration as tolerated.  Goal is to try to attain exercise frequency to 5 times a week.  If applicable: Best to perform resistance exercises (machines or weights) 2 days a week and cardio type exercises 3 days per week.  

## 2013-08-09 LAB — FOLATE: Folate: 17.8 ng/mL (ref >3.0)

## 2013-08-09 LAB — NMR, LIPOPROFILE
Cholesterol: 231 mg/dL — ABNORMAL HIGH (ref <200)
HDL Cholesterol by NMR: 46 mg/dL (ref >=40)
HDL Particle Number: 34.3 umol/L (ref >=30.5)
LDL Particle Number: 2228 nmol/L — ABNORMAL HIGH (ref <1000)
LDL Size: 20.4 nm — ABNORMAL LOW (ref >20.5)
LDLC SERPL CALC-MCNC: 130 mg/dL — ABNORMAL HIGH (ref <100)
LP-IR Score: 74 — ABNORMAL HIGH (ref <=45)
Small LDL Particle Number: 1373 nmol/L — ABNORMAL HIGH (ref <=527)
Triglycerides by NMR: 273 mg/dL — ABNORMAL HIGH (ref <150)

## 2013-08-09 LAB — CMP14+EGFR
ALT: 19 IU/L (ref 0–32)
AST: 15 IU/L (ref 0–40)
Albumin/Globulin Ratio: 1.7 (ref 1.1–2.5)
Albumin: 4.2 g/dL (ref 3.6–4.8)
Alkaline Phosphatase: 80 IU/L (ref 39–117)
BUN/Creatinine Ratio: 22 (ref 11–26)
BUN: 13 mg/dL (ref 8–27)
CO2: 23 mmol/L (ref 18–29)
Calcium: 9.3 mg/dL (ref 8.6–10.2)
Chloride: 102 mmol/L (ref 97–108)
Creatinine, Ser: 0.59 mg/dL (ref 0.57–1.00)
GFR calc Af Amer: 114 mL/min/{1.73_m2} (ref >59)
GFR calc non Af Amer: 99 mL/min/{1.73_m2} (ref >59)
Globulin, Total: 2.5 g/dL (ref 1.5–4.5)
Glucose: 119 mg/dL — ABNORMAL HIGH (ref 65–99)
Potassium: 3.7 mmol/L (ref 3.5–5.2)
Sodium: 142 mmol/L (ref 134–144)
Total Bilirubin: 0.3 mg/dL (ref 0.0–1.2)
Total Protein: 6.7 g/dL (ref 6.0–8.5)

## 2013-08-09 LAB — VITAMIN D 25 HYDROXY (VIT D DEFICIENCY, FRACTURES): Vit D, 25-Hydroxy: 27.2 ng/mL — ABNORMAL LOW (ref 30.0–100.0)

## 2013-08-09 LAB — TSH: TSH: 1.03 u[IU]/mL (ref 0.450–4.500)

## 2013-08-09 LAB — VITAMIN B12: Vitamin B-12: 289 pg/mL (ref 211–946)

## 2013-08-13 ENCOUNTER — Ambulatory Visit: Payer: Self-pay | Admitting: Family Medicine

## 2013-08-13 NOTE — Telephone Encounter (Signed)
This encounter was created in error - please disregard.

## 2013-09-21 ENCOUNTER — Encounter: Payer: BC Managed Care – PPO | Admitting: *Deleted

## 2013-09-28 ENCOUNTER — Encounter: Payer: Self-pay | Admitting: *Deleted

## 2013-10-01 ENCOUNTER — Ambulatory Visit (INDEPENDENT_AMBULATORY_CARE_PROVIDER_SITE_OTHER): Payer: BC Managed Care – PPO | Admitting: *Deleted

## 2013-10-01 DIAGNOSIS — Z950 Presence of cardiac pacemaker: Secondary | ICD-10-CM

## 2013-10-01 DIAGNOSIS — I498 Other specified cardiac arrhythmias: Secondary | ICD-10-CM

## 2013-10-06 ENCOUNTER — Other Ambulatory Visit: Payer: Self-pay | Admitting: Nurse Practitioner

## 2013-10-12 LAB — REMOTE PACEMAKER DEVICE
AL IMPEDENCE PM: 415 Ohm
ATRIAL PACING PM: 0
BATTERY VOLTAGE: 2.71 V
RV LEAD IMPEDENCE PM: 524 Ohm
VENTRICULAR PACING PM: 0

## 2013-10-15 ENCOUNTER — Encounter: Payer: Self-pay | Admitting: *Deleted

## 2013-10-20 ENCOUNTER — Telehealth: Payer: Self-pay | Admitting: Family Medicine

## 2013-10-20 ENCOUNTER — Ambulatory Visit (INDEPENDENT_AMBULATORY_CARE_PROVIDER_SITE_OTHER): Payer: BC Managed Care – PPO | Admitting: Nurse Practitioner

## 2013-10-20 ENCOUNTER — Encounter: Payer: Self-pay | Admitting: Nurse Practitioner

## 2013-10-20 VITALS — BP 132/76 | HR 81 | Temp 98.9°F | Ht 64.0 in | Wt 163.0 lb

## 2013-10-20 DIAGNOSIS — J329 Chronic sinusitis, unspecified: Secondary | ICD-10-CM

## 2013-10-20 MED ORDER — AZITHROMYCIN 250 MG PO TABS: ORAL_TABLET | ORAL | Status: DC

## 2013-10-20 NOTE — Progress Notes (Signed)
  Subjective:    Patient ID: [name], female    DOB: 04/14/1951, 62 y.o.   MRN: [id number]  HPI Patient in c/o sinus congestion, scratchy throat and ear pain- started end of  Last week- no better by today.    Review of Systems  Constitutional: Positive for fever (low grade intermittent).  HENT: Positive for congestion, ear pain, postnasal drip, rhinorrhea, sinus pressure and sore throat. Negative for ear discharge.   Respiratory: Positive for cough.        Objective:   Physical Exam  Constitutional: She appears well-developed and well-nourished.  HENT:  Right Ear: Hearing, tympanic membrane, external ear and ear canal normal.  Left Ear: Hearing, tympanic membrane, external ear and ear canal normal.  Nose: Mucosal edema and rhinorrhea present. Right sinus exhibits maxillary sinus tenderness. Right sinus exhibits no frontal sinus tenderness. Left sinus exhibits maxillary sinus tenderness. Left sinus exhibits no frontal sinus tenderness.  Mouth/Throat: Posterior oropharyngeal erythema (mild) present. No posterior oropharyngeal edema.  Cardiovascular: Normal rate, regular rhythm, normal heart sounds and intact distal pulses.   Pulmonary/Chest: Effort normal and breath sounds normal.    BP 132/76  Pulse 81  Temp(Src) 98.9 F (37.2 C) (Oral)  Ht 5\' 4"  (1.626 m)  Wt 163 lb (73.936 kg)  BMI 27.97 kg/m2       Assessment & Plan:   1. Sinusitis, chronic    Meds ordered this encounter  Medications  . azithromycin (ZITHROMAX) 250 MG tablet    Sig: As directed    Dispense:  6 tablet    Refill:  0    Order Specific Question:  Supervising Provider    Answer:  [name] [[id number]]   1. Take meds as prescribed 2. Use a cool mist humidifier especially during the winter months and when heat has  been humid. 3. Use saline nose sprays frequently 4. Saline irrigations of the nose can be very helpful if done frequently.  * 4X daily for 1 week*  * Use of a nettie pot can be  helpful with this. Follow directions with this* 5. Drink plenty of fluids 6. Keep thermostat turn down low 7.For any cough or congestion  Use plain Mucinex- regular strength or max strength is fine   * Children- consult with Pharmacist for dosing 8. For fever or aces or pains- take tylenol or ibuprofen appropriate for age and weight.  * for fevers greater than 101 orally you may alternate ibuprofen and tylenol every  3 hours.   Mary-Margaret Daphine Deutscher, FNP

## 2013-10-20 NOTE — Patient Instructions (Signed)

## 2013-10-20 NOTE — Telephone Encounter (Signed)
appt given for today 

## 2013-10-27 ENCOUNTER — Encounter: Payer: Self-pay | Admitting: Internal Medicine

## 2013-12-03 ENCOUNTER — Ambulatory Visit (INDEPENDENT_AMBULATORY_CARE_PROVIDER_SITE_OTHER): Payer: BC Managed Care – PPO | Admitting: Internal Medicine

## 2013-12-03 ENCOUNTER — Encounter: Payer: Self-pay | Admitting: Internal Medicine

## 2013-12-03 VITALS — BP 132/80 | HR 80 | Ht 64.0 in | Wt 167.0 lb

## 2013-12-03 DIAGNOSIS — Z950 Presence of cardiac pacemaker: Secondary | ICD-10-CM

## 2013-12-03 DIAGNOSIS — I498 Other specified cardiac arrhythmias: Secondary | ICD-10-CM

## 2013-12-03 DIAGNOSIS — I10 Essential (primary) hypertension: Secondary | ICD-10-CM

## 2013-12-03 LAB — MDC_IDC_ENUM_SESS_TYPE_INCLINIC
Battery Impedance: 3612 Ohm
Battery Remaining Longevity: 13 mo
Battery Voltage: 2.68 V
Brady Statistic AP VP Percent: 0 %
Brady Statistic AP VS Percent: 0 %
Brady Statistic AS VS Percent: 100 %
Date Time Interrogation Session: 20141218121246
Lead Channel Impedance Value: 399 Ohm
Lead Channel Pacing Threshold Amplitude: 0.5 V
Lead Channel Pacing Threshold Amplitude: 0.5 V
Lead Channel Sensing Intrinsic Amplitude: 2 mV
Lead Channel Sensing Intrinsic Amplitude: 5.6 mV
Lead Channel Setting Pacing Amplitude: 2.5 V
Lead Channel Setting Pacing Pulse Width: 0.4 ms

## 2013-12-03 NOTE — Assessment & Plan Note (Signed)
Her medtronic dual-chamber pacemaker is working normally. We'll plan to recheck in several months.

## 2013-12-03 NOTE — Patient Instructions (Signed)
Your physician wants you to follow-up in: 12 months with Dr Court Joy will receive a reminder letter in the mail two months in advance. If you don't receive a letter, please call our office to schedule the follow-up appointment.   Remote monitoring is used to monitor your Pacemaker or ICD from home. This monitoring reduces the number of office visits required to check your device to one time per year. It allows [place] to keep an eye on the functioning of your device to ensure it is working properly. You are scheduled for a device check from home on 03/08/14. You may send your transmission at any time that day. If you have a wireless device, the transmission will be sent automatically. After your physician reviews your transmission, you will receive a postcard with your next transmission date.

## 2013-12-03 NOTE — Progress Notes (Signed)
HPI [name] returns today for followup. She is a pleasant 62 yo woman with a h/o symptomatic bradycardia, s/p PPM. She also has a h/o dyslipidemia. In the interim she has done well. 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 No chest pain or sob. No Known Allergies   Current Outpatient Prescriptions  Medication Sig Dispense Refill  . BYSTOLIC 5 MG tablet TAKE 1 TABLET (5 MG TOTAL) DAILY  90 tablet  5  . calcium citrate-vitamin D (CITRACAL+D) 315-200 MG-UNIT per tablet Take 1 tablet by mouth daily.        Marland Kitchen esomeprazole (NEXIUM) 40 MG capsule Take 40 mg by mouth daily before breakfast.      . LOVAZA 1 G capsule TAKE (2) CAPSULES TWICE DAILY.  120 capsule  3  . sertraline (ZOLOFT) 25 MG tablet Take 25 mg by mouth daily.         No current facility-administered medications for this visit.     Past Medical History  Diagnosis Date  . Symptomatic bradycardia   . GERD (gastroesophageal reflux disease)     Hiatal Hernia  . Arthritis     of the neck  . History of uterine cancer   . Hyperlipidemia   . Hypertension   . Osteoporosis   . Anxiety   . Colon polyps   . Esophageal stricture   . Pacemaker     ROS:   All systems reviewed and negative except as noted in the HPI.   Past Surgical History  Procedure Laterality Date  . Laparoscopic total hysterectomy    . Lymph node dissection      bilateral pelvic and right-sided periaortic  . Status post pacemaker      metronic [phone]  . Transthoracic echocardiogram  08/30/04  . Cholecystectomy    . Appendectomy    . Tubal ligation       No family history on file.   History   Social History  . Marital Status: Single    Spouse Name: N/A    Number of Children: N/A  . Years of Education: N/A   Occupational History  . employed     Scientist, product/process development   Social History Main Topics  . Smoking status: Never Smoker   . Smokeless tobacco: Not on file  . Alcohol Use: No  . Drug Use: Not on file  .  Sexual Activity: Not on file   Other Topics Concern  . Not on file   Social History Narrative  . No narrative on file     BP 132/80  Pulse 80  Ht 5\' 4"  (1.626 m)  Wt 167 lb (75.751 kg)  BMI 28.65 kg/m2  Physical Exam:  Well appearing middle-aged woman, NAD HEENT: Unremarkable Neck:  No JVD, no thyromegally Lungs:  Clear with no wheezes, rales, or rhonchi. Well-healed pacemaker incision HEART:  Regular rate rhythm, no murmurs, no rubs, no clicks Abd:  soft, positive bowel sounds, no organomegally, no rebound, no guarding Ext:  2 plus pulses, no edema, no cyanosis, no clubbing Skin:  No rashes no nodules Neuro:  CN II through XII intact, motor grossly intact  DEVICE  Normal device function.  See PaceArt for details.   Assess/Plan:

## 2013-12-03 NOTE — Assessment & Plan Note (Signed)
Her blood pressure is currently well controlled. She is encouraged to maintain a low-sodium diet, and increase her physical activity.

## 2013-12-08 ENCOUNTER — Ambulatory Visit: Payer: BC Managed Care – PPO | Admitting: Family Medicine

## 2013-12-11 ENCOUNTER — Encounter: Payer: Self-pay | Admitting: Internal Medicine

## 2013-12-21 ENCOUNTER — Encounter: Payer: Self-pay | Admitting: Family Medicine

## 2013-12-21 ENCOUNTER — Ambulatory Visit (INDEPENDENT_AMBULATORY_CARE_PROVIDER_SITE_OTHER): Payer: BC Managed Care – PPO | Admitting: Family Medicine

## 2013-12-21 VITALS — BP 146/85 | HR 74 | Temp 97.6°F | Ht 64.0 in | Wt 164.0 lb

## 2013-12-21 DIAGNOSIS — K219 Gastro-esophageal reflux disease without esophagitis: Secondary | ICD-10-CM

## 2013-12-21 DIAGNOSIS — K635 Polyp of colon: Secondary | ICD-10-CM

## 2013-12-21 DIAGNOSIS — M810 Age-related osteoporosis without current pathological fracture: Secondary | ICD-10-CM

## 2013-12-21 DIAGNOSIS — Z8542 Personal history of malignant neoplasm of other parts of uterus: Secondary | ICD-10-CM

## 2013-12-21 DIAGNOSIS — I10 Essential (primary) hypertension: Secondary | ICD-10-CM

## 2013-12-21 DIAGNOSIS — E785 Hyperlipidemia, unspecified: Secondary | ICD-10-CM

## 2013-12-21 DIAGNOSIS — Z950 Presence of cardiac pacemaker: Secondary | ICD-10-CM

## 2013-12-21 DIAGNOSIS — D126 Benign neoplasm of colon, unspecified: Secondary | ICD-10-CM

## 2013-12-21 MED ORDER — OMEGA-3-ACID ETHYL ESTERS 1 G PO CAPS: ORAL_CAPSULE | ORAL | Status: DC

## 2013-12-21 MED ORDER — PANTOPRAZOLE SODIUM 40 MG PO TBEC: 40.0000 mg | DELAYED_RELEASE_TABLET | Freq: Every day | ORAL | Status: DC

## 2013-12-21 NOTE — Patient Instructions (Signed)
      Dr Pearce Littlefield's Recommendations  For nutrition information, I recommend books:  1).Eat to Live by Dr Joel Fuhrman. 2).Prevent and Reverse Heart Disease by Dr Caldwell Esselstyn. 3) Dr Neal Barnard's Book:  Program to Reverse Diabetes  Exercise recommendations are:  If unable to walk, then the patient can exercise in a chair 3 times a day. By flapping arms like a bird gently and raising legs outwards to the front.  If ambulatory, the patient can go for walks for 30 minutes 3 times a week. Then increase the intensity and duration as tolerated.  Goal is to try to attain exercise frequency to 5 times a week.  If applicable: Best to perform resistance exercises (machines or weights) 2 days a week and cardio type exercises 3 days per week.  

## 2013-12-21 NOTE — Progress Notes (Signed)
Patient ID: [name], female   DOB: Dec 25, 1950, 63 y.o.   MRN: [id number] SUBJECTIVE: CC: Chief Complaint  Patient presents with  . Follow-up    4 month ck up refills except bp med in which Dr Cristopher Peru fills per pt. wants something other than nexium having loose stools from nexium    HPI: Patient is here for follow up of hyperlipidemia/HTN/GERD: denies Headache;denies Chest Pain;denies weakness;denies Shortness of Breath and orthopnea;denies Visual changes;denies palpitations;denies cough;denies pedal edema;denies symptoms of TIA or stroke;deniesClaudication symptoms. admits to Compliance with medications; Problems with medications.myalgias with statins Trying to eat better  Past Medical History  Diagnosis Date  . Symptomatic bradycardia   . GERD (gastroesophageal reflux disease)     Hiatal Hernia  . Arthritis     of the neck  . History of uterine cancer   . Hyperlipidemia   . Hypertension   . Osteoporosis   . Anxiety   . Colon polyps   . Esophageal stricture   . Pacemaker    Past Surgical History  Procedure Laterality Date  . Laparoscopic total hysterectomy    . Lymph node dissection      bilateral pelvic and right-sided periaortic  . Status post pacemaker      metronic [phone]  . Transthoracic echocardiogram  08/30/04  . Cholecystectomy    . Appendectomy    . Tubal ligation     History   Social History  . Marital Status: Single    Spouse Name: N/A    Number of Children: N/A  . Years of Education: N/A   Occupational History  . employed     Engineer, manufacturing systems   Social History Main Topics  . Smoking status: Never Smoker   . Smokeless tobacco: Not on file  . Alcohol Use: No  . Drug Use: Not on file  . Sexual Activity: Not on file   Other Topics Concern  . Not on file   Social History Narrative  . No narrative on file   No family history on file. Current Outpatient Prescriptions on File Prior to Visit  Medication Sig Dispense Refill  .  BYSTOLIC 5 MG tablet TAKE 1 TABLET (5 MG TOTAL) DAILY  90 tablet  5  . calcium citrate-vitamin D (CITRACAL+D) 315-200 MG-UNIT per tablet Take 1 tablet by mouth daily.        . sertraline (ZOLOFT) 25 MG tablet Take 25 mg by mouth daily.         No current facility-administered medications on file prior to visit.   No Known Allergies  There is no immunization history on file for this patient. Prior to Admission medications   Medication Sig Start Date End Date Taking? Authorizing Provider  BYSTOLIC 5 MG tablet TAKE 1 TABLET (5 MG TOTAL) DAILY 02/15/13   Evans Lance, MD  calcium citrate-vitamin D (CITRACAL+D) 315-200 MG-UNIT per tablet Take 1 tablet by mouth daily.      Historical Provider, MD  esomeprazole (NEXIUM) 40 MG capsule Take 40 mg by mouth daily before breakfast.    Historical Provider, MD  LOVAZA 1 G capsule TAKE (2) CAPSULES TWICE DAILY. 10/06/13   Mary-Margaret Hassell Done, FNP  sertraline (ZOLOFT) 25 MG tablet Take 25 mg by mouth daily.      Historical Provider, MD     ROS: As above in the HPI. All other systems are stable or negative.  OBJECTIVE: APPEARANCE:  Patient in no acute distress.The patient appeared well nourished and normally developed. Acyanotic. Waist: VITAL  SIGNS:BP 146/85  Pulse 74  Temp(Src) 97.6 F (36.4 C) (Oral)  Ht 5' 4" (1.626 m)  Wt 164 lb (74.39 kg)  BMI 28.14 kg/m2  WF  SKIN: warm and  Dry without overt rashes, tattoos and scars  HEAD and Neck: without JVD, Head and scalp: normal Eyes:No scleral icterus. Fundi normal, eye movements normal. Ears: Auricle normal, canal normal, Tympanic membranes normal, insufflation normal. Nose: normal Throat: normal Neck & thyroid: normal  CHEST & LUNGS: Chest wall: normal Lungs: Clear  CVS: Reveals the PMI to be normally located. Regular rhythm, First and Second Heart sounds are normal,  absence of murmurs, rubs or gallops. Peripheral vasculature: Radial pulses: normal Dorsal pedis pulses:  normal Posterior pulses: normal  ABDOMEN:  Appearance: normal Benign, no organomegaly, no masses, no Abdominal Aortic enlargement. No Guarding , no rebound. No Bruits. Bowel sounds: normal  RECTAL: N/A GU: N/A  EXTREMETIES: nonedematous.  MUSCULOSKELETAL:  Spine: normal Joints: intact  NEUROLOGIC: oriented to time,place and person; nonfocal. Strength is normal Sensory is normal Reflexes are normal Cranial Nerves are normal.   ASSESSMENT: Essential hypertension, benign - Plan: CMP14+EGFR  History of uterine cancer  Hyperlipidemia - Plan: CMP14+EGFR, NMR, lipoprofile, omega-3 acid ethyl esters (LOVAZA) 1 G capsule  Pacemaker  PPM-Medtronic  Colon polyps  GERD (gastroesophageal reflux disease) - Plan: pantoprazole (PROTONIX) 40 MG tablet  Osteoporosis Intolerant and unwilling to try another statin.   PLAN:      Dr Paula Libra Recommendations  For nutrition information, I recommend books:  1).Eat to Live by Dr Excell Seltzer. 2).Prevent and Reverse Heart Disease by Dr Karl Luke. 3) Dr Janene Harvey Book:  Program to Reverse Diabetes  Exercise recommendations are:  If unable to walk, then the patient can exercise in a chair 3 times a day. By flapping arms like a bird gently and raising legs outwards to the front.  If ambulatory, the patient can go for walks for 30 minutes 3 times a week. Then increase the intensity and duration as tolerated.  Goal is to try to attain exercise frequency to 5 times a week.  If applicable: Best to perform resistance exercises (machines or weights) 2 days a week and cardio type exercises 3 days per week.  Orders Placed This Encounter  Procedures  . CMP14+EGFR  . NMR, lipoprofile   Meds ordered this encounter  Medications  . pantoprazole (PROTONIX) 40 MG tablet    Sig: Take 1 tablet (40 mg total) by mouth daily.    Dispense:  90 tablet    Refill:  3  . omega-3 acid ethyl esters (LOVAZA) 1 G capsule    Sig:  TAKE (2) CAPSULES TWICE DAILY.    Dispense:  360 capsule    Refill:  3   Medications Discontinued During This Encounter  Medication Reason  . esomeprazole (NEXIUM) 40 MG capsule Side effect (s)  . LOVAZA 1 G capsule Reorder   Return in about 4 months (around 04/20/2014) for Recheck medical problems. Consider  Livalo pending labs.  Berneice Zettlemoyer P. Jacelyn Grip, M.D.

## 2013-12-22 LAB — NMR, LIPOPROFILE
Cholesterol: 217 mg/dL — ABNORMAL HIGH (ref <200)
HDL Cholesterol by NMR: 46 mg/dL (ref >=40)
HDL Particle Number: 37.2 umol/L (ref >=30.5)
LDL Particle Number: 2059 nmol/L — ABNORMAL HIGH (ref <1000)
LDL Size: 20.1 nm — ABNORMAL LOW (ref >20.5)
LDLC SERPL CALC-MCNC: 126 mg/dL — ABNORMAL HIGH (ref <100)
LP-IR Score: 73 — ABNORMAL HIGH (ref <=45)
Small LDL Particle Number: 1253 nmol/L — ABNORMAL HIGH (ref <=527)
Triglycerides by NMR: 227 mg/dL — ABNORMAL HIGH (ref <150)

## 2013-12-22 LAB — CMP14+EGFR
ALT: 14 IU/L (ref 0–32)
AST: 11 IU/L (ref 0–40)
Albumin/Globulin Ratio: 1.6 (ref 1.1–2.5)
Albumin: 4.1 g/dL (ref 3.6–4.8)
Alkaline Phosphatase: 83 IU/L (ref 39–117)
BUN/Creatinine Ratio: 29 — ABNORMAL HIGH (ref 11–26)
BUN: 17 mg/dL (ref 8–27)
CO2: 21 mmol/L (ref 18–29)
Calcium: 9.8 mg/dL (ref 8.6–10.2)
Chloride: 102 mmol/L (ref 97–108)
Creatinine, Ser: 0.58 mg/dL (ref 0.57–1.00)
GFR calc Af Amer: 114 mL/min/{1.73_m2} (ref >59)
GFR calc non Af Amer: 99 mL/min/{1.73_m2} (ref >59)
Globulin, Total: 2.5 g/dL (ref 1.5–4.5)
Glucose: 129 mg/dL — ABNORMAL HIGH (ref 65–99)
Potassium: 3.9 mmol/L (ref 3.5–5.2)
Sodium: 141 mmol/L (ref 134–144)
Total Bilirubin: <0.2 mg/dL (ref 0.0–1.2)
Total Protein: 6.6 g/dL (ref 6.0–8.5)

## 2013-12-23 ENCOUNTER — Other Ambulatory Visit: Payer: Self-pay | Admitting: Family Medicine

## 2013-12-23 DIAGNOSIS — E785 Hyperlipidemia, unspecified: Secondary | ICD-10-CM

## 2013-12-23 MED ORDER — EZETIMIBE 10 MG PO TABS: 10.0000 mg | ORAL_TABLET | Freq: Every day | ORAL | Status: DC

## 2014-03-01 ENCOUNTER — Other Ambulatory Visit: Payer: Self-pay | Admitting: Family Medicine

## 2014-03-03 NOTE — Telephone Encounter (Signed)
Call patient : Prescription refilled & sent to pharmacy in [place]. 

## 2014-03-03 NOTE — Telephone Encounter (Signed)
Last ov 1/15. 

## 2014-03-05 ENCOUNTER — Other Ambulatory Visit: Payer: Self-pay | Admitting: Family Medicine

## 2014-03-08 ENCOUNTER — Ambulatory Visit (INDEPENDENT_AMBULATORY_CARE_PROVIDER_SITE_OTHER): Payer: BC Managed Care – PPO | Admitting: *Deleted

## 2014-03-08 DIAGNOSIS — R001 Bradycardia, unspecified: Secondary | ICD-10-CM

## 2014-03-08 DIAGNOSIS — I498 Other specified cardiac arrhythmias: Secondary | ICD-10-CM

## 2014-03-10 ENCOUNTER — Encounter: Payer: Self-pay | Admitting: Internal Medicine

## 2014-03-10 LAB — MDC_IDC_ENUM_SESS_TYPE_REMOTE
Battery Remaining Longevity: 2 mo
Brady Statistic AP VP Percent: 0.1 % — CL
Brady Statistic AP VS Percent: 0.1 % — CL
Lead Channel Impedance Value: 422 Ohm
Lead Channel Impedance Value: 514 Ohm
Lead Channel Setting Pacing Amplitude: 2.5 V
Lead Channel Setting Pacing Pulse Width: 0.4 ms
MDC IDC MSMT BATTERY IMPEDANCE: 5678 Ohm
MDC IDC MSMT BATTERY VOLTAGE: 2.62 V
MDC IDC SET LEADCHNL RA PACING AMPLITUDE: 2 V
MDC IDC SET LEADCHNL RV SENSING SENSITIVITY: 2.8 mV
MDC IDC STAT BRADY AS VP PERCENT: 0.1 % — AB
MDC IDC STAT BRADY AS VS PERCENT: 99.9 %

## 2014-03-17 ENCOUNTER — Encounter: Payer: Self-pay | Admitting: *Deleted

## 2014-03-26 ENCOUNTER — Telehealth: Payer: Self-pay | Admitting: Internal Medicine

## 2014-03-26 NOTE — Telephone Encounter (Signed)
LMOM---[id number]/KWM

## 2014-03-26 NOTE — Telephone Encounter (Signed)
New message    Patient calling has questions on remote transiting date on 4/23.

## 2014-03-26 NOTE — Telephone Encounter (Signed)
Spoke w/pt and answered questions about having monthly remote checks due to battery.

## 2014-03-29 ENCOUNTER — Other Ambulatory Visit: Payer: Self-pay

## 2014-03-29 MED ORDER — NEBIVOLOL HCL 5 MG PO TABS: ORAL_TABLET | ORAL | Status: DC

## 2014-04-08 ENCOUNTER — Ambulatory Visit (INDEPENDENT_AMBULATORY_CARE_PROVIDER_SITE_OTHER): Payer: BC Managed Care – PPO | Admitting: *Deleted

## 2014-04-08 ENCOUNTER — Encounter: Payer: Self-pay | Admitting: Internal Medicine

## 2014-04-08 DIAGNOSIS — I495 Sick sinus syndrome: Secondary | ICD-10-CM

## 2014-04-15 ENCOUNTER — Ambulatory Visit (INDEPENDENT_AMBULATORY_CARE_PROVIDER_SITE_OTHER): Payer: BC Managed Care – PPO | Admitting: Family Medicine

## 2014-04-15 ENCOUNTER — Encounter: Payer: Self-pay | Admitting: Family Medicine

## 2014-04-15 VITALS — BP 134/80 | HR 74 | Temp 97.9°F | Ht 64.0 in | Wt 168.0 lb

## 2014-04-15 DIAGNOSIS — I10 Essential (primary) hypertension: Secondary | ICD-10-CM

## 2014-04-15 DIAGNOSIS — K635 Polyp of colon: Secondary | ICD-10-CM

## 2014-04-15 DIAGNOSIS — Z8542 Personal history of malignant neoplasm of other parts of uterus: Secondary | ICD-10-CM

## 2014-04-15 DIAGNOSIS — M1990 Unspecified osteoarthritis, unspecified site: Secondary | ICD-10-CM

## 2014-04-15 DIAGNOSIS — K219 Gastro-esophageal reflux disease without esophagitis: Secondary | ICD-10-CM

## 2014-04-15 DIAGNOSIS — M129 Arthropathy, unspecified: Secondary | ICD-10-CM

## 2014-04-15 DIAGNOSIS — R002 Palpitations: Secondary | ICD-10-CM | POA: Insufficient documentation

## 2014-04-15 DIAGNOSIS — E785 Hyperlipidemia, unspecified: Secondary | ICD-10-CM

## 2014-04-15 DIAGNOSIS — K222 Esophageal obstruction: Secondary | ICD-10-CM

## 2014-04-15 DIAGNOSIS — M810 Age-related osteoporosis without current pathological fracture: Secondary | ICD-10-CM

## 2014-04-15 DIAGNOSIS — D126 Benign neoplasm of colon, unspecified: Secondary | ICD-10-CM

## 2014-04-15 DIAGNOSIS — Z950 Presence of cardiac pacemaker: Secondary | ICD-10-CM

## 2014-04-15 LAB — MDC_IDC_ENUM_SESS_TYPE_REMOTE
Battery Impedance: 6605 Ohm
Battery Remaining Longevity: -1 mo
Battery Voltage: 2.59 V
Brady Statistic AS VP Percent: 0 %
Brady Statistic AS VS Percent: 100 %
Date Time Interrogation Session: 20150423172351
Lead Channel Impedance Value: 574 Ohm
Lead Channel Setting Pacing Pulse Width: 0.4 ms
MDC IDC MSMT LEADCHNL RA IMPEDANCE VALUE: 424 Ohm
MDC IDC SET LEADCHNL RA PACING AMPLITUDE: 2 V
MDC IDC SET LEADCHNL RV PACING AMPLITUDE: 2.5 V
MDC IDC SET LEADCHNL RV SENSING SENSITIVITY: 4 mV
MDC IDC STAT BRADY AP VP PERCENT: 0 %
MDC IDC STAT BRADY AP VS PERCENT: 0 %

## 2014-04-15 MED ORDER — PANTOPRAZOLE SODIUM 40 MG PO TBEC: 40.0000 mg | DELAYED_RELEASE_TABLET | Freq: Every day | ORAL | Status: DC

## 2014-04-15 MED ORDER — EZETIMIBE 10 MG PO TABS: 10.0000 mg | ORAL_TABLET | Freq: Every day | ORAL | Status: DC

## 2014-04-15 MED ORDER — OMEGA-3-ACID ETHYL ESTERS 1 G PO CAPS: ORAL_CAPSULE | ORAL | Status: DC

## 2014-04-15 MED ORDER — SERTRALINE HCL 50 MG PO TABS: ORAL_TABLET | ORAL | Status: DC

## 2014-04-15 NOTE — Patient Instructions (Signed)
Will put on a 48 hour holter monitor. Lab work. Make a followup appointment with your cardiologist in 2 weeks.  DASH Diet The DASH diet stands for "Dietary Approaches to Stop Hypertension." It is a healthy eating plan that has been shown to reduce high blood pressure (hypertension) in as little as 14 days, while also possibly providing other significant health benefits. These other health benefits include reducing the risk of breast cancer after menopause and reducing the risk of type 2 diabetes, heart disease, colon cancer, and stroke. Health benefits also include weight loss and slowing kidney failure in patients with chronic kidney disease.  DIET GUIDELINES  Limit salt (sodium). Your diet should contain less than 1500 mg of sodium daily.  Limit refined or processed carbohydrates. Your diet should include mostly whole grains. Desserts and added sugars should be used sparingly.  Include small amounts of heart-healthy fats. These types of fats include nuts, oils, and tub margarine. Limit saturated and trans fats. These fats have been shown to be harmful in the body. CHOOSING FOODS  The following food groups are based on a 2000 calorie diet. See your Registered Dietitian for individual calorie needs. Grains and Grain Products (6 to 8 servings daily)  Eat More Often: Whole-wheat bread, brown rice, whole-grain or wheat pasta, quinoa, popcorn without added fat or salt (air popped).  Eat Less Often: White bread, white pasta, white rice, cornbread. Vegetables (4 to 5 servings daily)  Eat More Often: Fresh, frozen, and canned vegetables. Vegetables may be raw, steamed, roasted, or grilled with a minimal amount of fat.  Eat Less Often/Avoid: Creamed or fried vegetables. Vegetables in a cheese sauce. Fruit (4 to 5 servings daily)  Eat More Often: All fresh, canned (in natural juice), or frozen fruits. Dried fruits without added sugar. One hundred percent fruit juice ( cup [237 mL] daily).  Eat  Less Often: Dried fruits with added sugar. Canned fruit in light or heavy syrup. [employer], Fish, and Poultry (2 servings or less daily. One serving is 3 to 4 oz [85-114 g]).  Eat More Often: Ninety percent or leaner ground beef, tenderloin, sirloin. Round cuts of beef, chicken breast, [place] breast. All fish. Grill, bake, or broil your meat. Nothing should be fried.  Eat Less Often/Avoid: Fatty cuts of meat, [place], or chicken leg, thigh, or wing. Fried cuts of meat or fish. Dairy (2 to 3 servings)  Eat More Often: Low-fat or fat-free milk, low-fat plain or light yogurt, reduced-fat or part-skim cheese.  Eat Less Often/Avoid: Milk (whole, 2%).Whole milk yogurt. Full-fat cheeses. Nuts, Seeds, and Legumes (4 to 5 servings per week)  Eat More Often: All without added salt.  Eat Less Often/Avoid: Salted nuts and seeds, canned beans with added salt. Fats and Sweets (limited)  Eat More Often: Vegetable oils, tub margarines without trans fats, sugar-free gelatin. Mayonnaise and salad dressings.  Eat Less Often/Avoid: Coconut oils, palm oils, butter, stick margarine, cream, half and half, cookies, candy, pie. FOR MORE INFORMATION The Dash Diet Eating Plan: www.dashdiet.org Document Released: 11/22/2011 Document Revised: 02/25/2012 Document Reviewed: 11/22/2011 [facility] Patient Information 2014 [place], [place].        Dr Paula Libra Recommendations  For nutrition information, I recommend books:  1).Eat to Live by Dr Excell Seltzer. 2).Prevent and Reverse Heart Disease by Dr Karl Luke. 3) Dr Janene Harvey Book:  Program to Reverse Diabetes  Exercise recommendations are:  If unable to walk, then the patient can exercise in a chair 3 times a day. By  flapping arms like a bird gently and raising legs outwards to the front.  If ambulatory, the patient can go for walks for 30 minutes 3 times a week. Then increase the intensity and duration as tolerated.  Goal is to try to attain  exercise frequency to 5 times a week.  If applicable: Best to perform resistance exercises (machines or weights) 2 days a week and cardio type exercises 3 days per week.  Palpitations  A palpitation is the feeling that your heartbeat is irregular or is faster than normal. It may feel like your heart is fluttering or skipping a beat. Palpitations are usually not a serious problem. However, in some cases, you may need further medical evaluation. CAUSES  Palpitations can be caused by:  Smoking.  Caffeine or other stimulants, such as diet pills or energy drinks.  Alcohol.  Stress and anxiety.  Strenuous physical activity.  Fatigue.  Certain medicines.  Heart disease, especially if you have a history of arrhythmias. This includes atrial fibrillation, atrial flutter, or supraventricular tachycardia.  An improperly working pacemaker or defibrillator. DIAGNOSIS  To find the cause of your palpitations, your caregiver will take your history and perform a physical exam. Tests may also be done, including:  Electrocardiography (ECG). This test records the heart's electrical activity.  Cardiac monitoring. This allows your caregiver to monitor your heart rate and rhythm in real time.  Holter monitor. This is a portable device that records your heartbeat and can help diagnose heart arrhythmias. It allows your caregiver to track your heart activity for several days, if needed.  Stress tests by exercise or by giving medicine that makes the heart beat faster. TREATMENT  Treatment of palpitations depends on the cause of your symptoms and can vary greatly. Most cases of palpitations do not require any treatment other than time, relaxation, and monitoring your symptoms. Other causes, such as atrial fibrillation, atrial flutter, or supraventricular tachycardia, usually require further treatment. HOME CARE INSTRUCTIONS   Avoid:  Caffeinated coffee, tea, soft drinks, diet pills, and energy  drinks.  Chocolate.  Alcohol.  Stop smoking if you smoke.  Reduce your stress and anxiety. Things that can help you relax include:  A method that measures bodily functions so you can learn to control them (biofeedback).  Yoga.  Meditation.  Physical activity such as swimming, jogging, or walking.  Get plenty of rest and sleep. SEEK MEDICAL CARE IF:   You continue to have a fast or irregular heartbeat beyond 24 hours.  Your palpitations occur more often. SEEK IMMEDIATE MEDICAL CARE IF:  You develop chest pain or shortness of breath.  You have a severe headache.  You feel dizzy, or you faint. MAKE SURE YOU:  Understand these instructions.  Will watch your condition.  Will get help right away if you are not doing well or get worse. Document Released: 11/30/2000 Document Revised: 03/30/2013 Document Reviewed: 02/01/2012 [facility] Patient Information 2014 [place].

## 2014-04-15 NOTE — Progress Notes (Signed)
Patient ID: [name], female   DOB: 12-14-1951, 63 y.o.   MRN: [id number] SUBJECTIVE: CC: Chief Complaint  Patient presents with  . Hypertension    3 month recheck  . Hyperlipidemia    HPI: Patient is here for follow up of hyperlipidemia/Htn/Pacer : denies Headache;denies Chest Pain;denies weakness;denies Shortness of Breath and orthopnea;denies Visual changes;  palpitations: every now and then feels some flips in the heart beat.did not contact the cardiologist.  ;denies cough;denies pedal edema;denies symptoms of TIA or stroke;deniesClaudication symptoms. admits to Compliance with medications; denies Problems with medications. Otherwise has been fine.   Past Medical History  Diagnosis Date  . Symptomatic bradycardia   . GERD (gastroesophageal reflux disease)     Hiatal Hernia  . Arthritis     of the neck  . History of uterine cancer   . Hyperlipidemia   . Hypertension   . Osteoporosis   . Anxiety   . Colon polyps   . Esophageal stricture   . Pacemaker    Past Surgical History  Procedure Laterality Date  . Laparoscopic total hysterectomy    . Lymph node dissection      bilateral pelvic and right-sided periaortic  . Status post pacemaker      metronic [phone]  . Transthoracic echocardiogram  08/30/04  . Cholecystectomy    . Appendectomy    . Tubal ligation     History   Social History  . Marital Status: Single    Spouse Name: N/A    Number of Children: N/A  . Years of Education: N/A   Occupational History  . employed     Engineer, manufacturing systems   Social History Main Topics  . Smoking status: Never Smoker   . Smokeless tobacco: Not on file  . Alcohol Use: No  . Drug Use: Not on file  . Sexual Activity: Not on file   Other Topics Concern  . Not on file   Social History Narrative  . No narrative on file   No family history on file. Current Outpatient Prescriptions on File Prior to Visit  Medication Sig Dispense Refill  . calcium citrate-vitamin  D (CITRACAL+D) 315-200 MG-UNIT per tablet Take 1 tablet by mouth daily.        . nebivolol (BYSTOLIC) 5 MG tablet TAKE 1 TABLET (5 MG TOTAL) DAILY  90 tablet  1   No current facility-administered medications on file prior to visit.   No Known Allergies  There is no immunization history on file for this patient. Prior to Admission medications   Medication Sig Start Date End Date Taking? Authorizing Provider  calcium citrate-vitamin D (CITRACAL+D) 315-200 MG-UNIT per tablet Take 1 tablet by mouth daily.     Yes Historical Provider, MD  ezetimibe (ZETIA) 10 MG tablet Take 1 tablet (10 mg total) by mouth daily. 12/23/13  Yes Vernie Shanks, MD  nebivolol (BYSTOLIC) 5 MG tablet TAKE 1 TABLET (5 MG TOTAL) DAILY 03/29/14  Yes Evans Lance, MD  omega-3 acid ethyl esters (LOVAZA) 1 G capsule TAKE (2) CAPSULES TWICE DAILY. 12/21/13  Yes Vernie Shanks, MD  pantoprazole (PROTONIX) 40 MG tablet Take 1 tablet (40 mg total) by mouth daily. 12/21/13  Yes Vernie Shanks, MD  sertraline (ZOLOFT) 50 MG tablet TAKE (1/2) TABLET DAILY. 03/05/14  Yes Vernie Shanks, MD     ROS: As above in the HPI. All other systems are stable or negative.  OBJECTIVE: APPEARANCE:  Patient in no acute distress.The patient appeared  well nourished and normally developed. Acyanotic. Waist: VITAL SIGNS:BP 134/80  Pulse 74  Temp(Src) 97.9 F (36.6 C) (Oral)  Ht $R'5\' 4"'hr$  (1.626 m)  Wt 168 lb (76.204 kg)  BMI 28.82 kg/m2 WF looks well.  SKIN: warm and  Dry without overt rashes, tattoos and scars  HEAD and Neck: without JVD, Head and scalp: normal Eyes:No scleral icterus. Fundi normal, eye movements normal. Ears: Auricle normal, canal normal, Tympanic membranes normal, insufflation normal. Nose: normal Throat: normal Neck & thyroid: normal  CHEST & LUNGS: Chest wall: normal Lungs: Clear  CVS: Reveals the PMI to be normally located. Pacer intact. Regular rhythm,no ectopy clinically detected First and Second Heart sounds  are normal,  absence of murmurs, rubs or gallops. Peripheral vasculature: Radial pulses: normal Dorsal pedis pulses: normal Posterior pulses: normal  ABDOMEN:  Appearance: normal Benign, no organomegaly, no masses, no Abdominal Aortic enlargement. No Guarding , no rebound. No Bruits. Bowel sounds: normal  RECTAL: N/A GU: N/A  EXTREMETIES: nonedematous.  MUSCULOSKELETAL:  Spine: normal Joints: intact  NEUROLOGIC: oriented to time,place and person; nonfocal. Strength is normal Sensory is normal Reflexes are normal Cranial Nerves are normal.  . ASSESSMENT:  Hypertension - Plan: CMP14+EGFR  Hyperlipidemia - Plan: CMP14+EGFR, NMR, lipoprofile, omega-3 acid ethyl esters (LOVAZA) 1 G capsule  PPM-Medtronic  Pacemaker  History of uterine cancer  Essential hypertension, benign  Osteoporosis  GERD (gastroesophageal reflux disease) - Plan: pantoprazole (PROTONIX) 40 MG tablet  Esophageal stricture  Colon polyps  Arthritis  Palpitations - Plan: TSH, Holter monitor - 48 hour  HLD (hyperlipidemia) - Plan: ezetimibe (ZETIA) 10 MG tablet  PLAN: Will put on a 48 hour holter monitor. Lab work. Make a followup appointment with your cardiologist in 2 weeks.  Dash diet in the AVS Palpitations handout       Dr [name] Recommendations  For nutrition information, I recommend books:  1).Eat to Live by Dr [name]. 2).Prevent and Reverse Heart Disease by Dr [name]. 3) Dr [name] Book:  Program to Reverse Diabetes  Exercise recommendations are:  If unable to walk, then the patient can exercise in a chair 3 times a day. By flapping arms like a bird gently and raising legs outwards to the front.  If ambulatory, the patient can go for walks for 30 minutes 3 times a week. Then increase the intensity and duration as tolerated.  Goal is to try to attain exercise frequency to 5 times a week.  If applicable: Best to perform resistance  exercises (machines or weights) 2 days a week and cardio type exercises 3 days per week.  Orders Placed This Encounter  Procedures  . CMP14+EGFR  . TSH  . NMR, lipoprofile  . Holter monitor - 48 hour    Standing Status: Future     Number of Occurrences:      Standing Expiration Date: 04/16/2015   Meds ordered this encounter  Medications  . sertraline (ZOLOFT) 50 MG tablet    Sig: TAKE (1/2) TABLET DAILY.    Dispense:  90 tablet    Refill:  0  . ezetimibe (ZETIA) 10 MG tablet    Sig: Take 1 tablet (10 mg total) by mouth daily.    Dispense:  30 tablet    Refill:  5  . pantoprazole (PROTONIX) 40 MG tablet    Sig: Take 1 tablet (40 mg total) by mouth daily.    Dispense:  90 tablet    Refill:  3  . omega-3 acid  ethyl esters (LOVAZA) 1 G capsule    Sig: TAKE (2) CAPSULES TWICE DAILY.    Dispense:  360 capsule    Refill:  3   Medications Discontinued During This Encounter  Medication Reason  . sertraline (ZOLOFT) 25 MG tablet Duplicate  . sertraline (ZOLOFT) 50 MG tablet Reorder  . ezetimibe (ZETIA) 10 MG tablet Reorder  . pantoprazole (PROTONIX) 40 MG tablet Reorder  . omega-3 acid ethyl esters (LOVAZA) 1 G capsule Reorder   Return in about 3 months (around 07/15/2014) for Recheck medical problems.  Brody Bonneau P. Jacelyn Grip, M.D.

## 2014-04-16 LAB — CMP14+EGFR
ALT: 18 IU/L (ref 0–32)
AST: 15 IU/L (ref 0–40)
Albumin/Globulin Ratio: 1.7 (ref 1.1–2.5)
Albumin: 4.2 g/dL (ref 3.6–4.8)
Alkaline Phosphatase: 75 IU/L (ref 39–117)
BUN/Creatinine Ratio: 18 (ref 11–26)
BUN: 11 mg/dL (ref 8–27)
CO2: 25 mmol/L (ref 18–29)
Calcium: 9 mg/dL (ref 8.7–10.3)
Chloride: 100 mmol/L (ref 97–108)
Creatinine, Ser: 0.62 mg/dL (ref 0.57–1.00)
GFR calc Af Amer: 112 mL/min/{1.73_m2} (ref >59)
GFR calc non Af Amer: 97 mL/min/{1.73_m2} (ref >59)
Globulin, Total: 2.5 g/dL (ref 1.5–4.5)
Glucose: 128 mg/dL — ABNORMAL HIGH (ref 65–99)
Potassium: 4 mmol/L (ref 3.5–5.2)
Sodium: 142 mmol/L (ref 134–144)
Total Bilirubin: 0.4 mg/dL (ref 0.0–1.2)
Total Protein: 6.7 g/dL (ref 6.0–8.5)

## 2014-04-16 LAB — NMR, LIPOPROFILE
Cholesterol: 199 mg/dL (ref <200)
HDL Cholesterol by NMR: 45 mg/dL (ref >=40)
HDL Particle Number: 35.1 umol/L (ref >=30.5)
LDL Particle Number: 1713 nmol/L — ABNORMAL HIGH (ref <1000)
LDL Size: 20.5 nm (ref >20.5)
LDLC SERPL CALC-MCNC: 108 mg/dL — ABNORMAL HIGH (ref <100)
LP-IR Score: 83 — ABNORMAL HIGH (ref <=45)
Small LDL Particle Number: 1126 nmol/L — ABNORMAL HIGH (ref <=527)
Triglycerides by NMR: 229 mg/dL — ABNORMAL HIGH (ref <150)

## 2014-04-16 LAB — TSH: TSH: 1.03 u[IU]/mL (ref 0.450–4.500)

## 2014-04-19 ENCOUNTER — Ambulatory Visit (INDEPENDENT_AMBULATORY_CARE_PROVIDER_SITE_OTHER): Payer: BC Managed Care – PPO | Admitting: *Deleted

## 2014-04-19 DIAGNOSIS — R002 Palpitations: Secondary | ICD-10-CM

## 2014-04-19 NOTE — Addendum Note (Signed)
Addended by: Carlus Pavlov on: 04/19/2014 09:31 PM   Modules accepted: Level of Service

## 2014-04-19 NOTE — Progress Notes (Signed)
Patient came in today for 48 hour holter monitor. Patient aware we only have 24 hour and she will take off return holter monitor tomorrow and we will place another one on her. Patient aware and understands

## 2014-04-19 NOTE — Patient Instructions (Signed)
Holter Monitoring °A Holter monitor is a small device with electrodes (small sticky patches) that attach to your chest. It records the electrical activity of your heart and is worn continuously for 24-48 hours.  °A HOLTER MONITOR IS USED TO °· Detect heart problems such as: °· Heart arrhythmia. Is an abnormal or irregular heartbeat. With some heart arrhythmias, you may not feel or know that you have an irregular heart rhythm. °· Palpitations, such as feeling your heart racing or fluttering. It is possible to have heart palpitations and not have a heart arrhythmia. °· A heart rhythm that is too slow or too fast. °· If you have problems fainting, near fainting or feeling light-headed, a Holter monitor may be worn to see if your heart is the cause. °HOLTER MONITOR PREPARATION  °· Electrodes will be attached to the skin on your chest. °· If you have hair on your chest, small areas may have to be shaved. This is done to help the patches stick better and make the recording more accurate. °· The electrodes are attached by wires to the Holter monitor. The Holter monitor clips to your clothing. You will wear the monitor at all times, even while exercising and sleeping. °HOME CARE INSTRUCTIONS  °· Wear your monitor at all times. °· The wires and the monitor must stay dry. Do not get the monitor wet. °· Do not bathe, swim or use a hot tub with it on. °· You may do a "sponge" bath while you have the monitor on. °· Keep your skin clean, do not put body lotion or moisturizer on your chest. °· It's possible that your skin under the electrodes could become irritated. To keep this from happening, you may put the electrodes in slightly different places on your chest. °· Your caregiver will also ask you to keep a diary of your activities, such as walking or doing chores. Be sure to note what you are doing if you experience heart symptoms such as palpitations. This will help your caregiver determine what might be contributing to your  symptoms. The information stored in your monitor will be reviewed by your caregiver alongside your diary entries. °· Make sure the monitor is safely clipped to your clothing or in a location close to your body that your caregiver recommends. °· The monitor and electrodes are removed when the test is over. Return the monitor as directed. °· Be sure to follow up with your caregiver and discuss your Holter monitor results. °SEEK IMMEDIATE MEDICAL CARE IF: °· You faint or feel lightheaded. °· You have trouble breathing. °· You get pain in your chest, upper arm or jaw. °· You feel sick to your stomach and your skin is pale, cool, or damp. °· You think something is wrong with the way your heart is beating. °MAKE SURE YOU:  °· Understand these instructions. °· Will watch your condition. °· Will get help right away if you are not doing well or get worse. °Document Released: 08/31/2004 Document Revised: 02/25/2012 Document Reviewed: 01/13/2009 °[employer]® Patient Information ©2014 [employer]. ° °

## 2014-04-20 ENCOUNTER — Encounter: Payer: Self-pay | Admitting: Cardiology

## 2014-04-26 NOTE — Progress Notes (Signed)
48 hr holter monitor scanned in and sent to Dr. Crissie Sickles. Pt has an appointment on 05/04/2014 @ 4:15. Results discussed with patient.

## 2014-05-04 ENCOUNTER — Ambulatory Visit (INDEPENDENT_AMBULATORY_CARE_PROVIDER_SITE_OTHER): Payer: BC Managed Care – PPO | Admitting: Internal Medicine

## 2014-05-04 ENCOUNTER — Encounter: Payer: Self-pay | Admitting: Internal Medicine

## 2014-05-04 VITALS — BP 126/73 | HR 71 | Ht 64.0 in | Wt 166.0 lb

## 2014-05-04 DIAGNOSIS — I498 Other specified cardiac arrhythmias: Secondary | ICD-10-CM

## 2014-05-04 DIAGNOSIS — Z950 Presence of cardiac pacemaker: Secondary | ICD-10-CM

## 2014-05-04 DIAGNOSIS — R001 Bradycardia, unspecified: Secondary | ICD-10-CM

## 2014-05-04 DIAGNOSIS — I10 Essential (primary) hypertension: Secondary | ICD-10-CM

## 2014-05-04 NOTE — Assessment & Plan Note (Signed)
Her device is at [facility]. We will schedule generator replacement.

## 2014-05-04 NOTE — Assessment & Plan Note (Signed)
Her blood pressure is well controlled. She will continue her current meds.  

## 2014-05-04 NOTE — Progress Notes (Signed)
HPI [name] returns today for followup. She is a pleasant 62 yo woman with a h/o symptomatic bradycardia, s/p PPM. She also has a h/o dyslipidemia. In the interim she has done well. She has reached ERI and at times notes that her heart beats abnormally. No chest pain or sob. No Known Allergies   Current Outpatient Prescriptions  Medication Sig Dispense Refill  . calcium citrate-vitamin D (CITRACAL+D) 315-200 MG-UNIT per tablet Take 1 tablet by mouth daily.        . ezetimibe (ZETIA) 10 MG tablet Take 1 tablet (10 mg total) by mouth daily.  30 tablet  5  . nebivolol (BYSTOLIC) 5 MG tablet TAKE 1 TABLET (5 MG TOTAL) DAILY  90 tablet  1  . omega-3 acid ethyl esters (LOVAZA) 1 G capsule TAKE (2) CAPSULES TWICE DAILY.  360 capsule  3  . pantoprazole (PROTONIX) 40 MG tablet Take 1 tablet (40 mg total) by mouth daily.  90 tablet  3  . sertraline (ZOLOFT) 50 MG tablet TAKE (1/2) TABLET DAILY.  90 tablet  0   No current facility-administered medications for this visit.     Past Medical History  Diagnosis Date  . Symptomatic bradycardia   . GERD (gastroesophageal reflux disease)     Hiatal Hernia  . Arthritis     of the neck  . History of uterine cancer   . Hyperlipidemia   . Hypertension   . Osteoporosis   . Anxiety   . Colon polyps   . Esophageal stricture   . Pacemaker     ROS:   All systems reviewed and negative except as noted in the HPI.   Past Surgical History  Procedure Laterality Date  . Laparoscopic total hysterectomy    . Lymph node dissection      bilateral pelvic and right-sided periaortic  . Status post pacemaker      metronic [id number]  . Transthoracic echocardiogram  08/30/04  . Cholecystectomy    . Appendectomy    . Tubal ligation       No family history on file.   History   Social History  . Marital Status: Single    Spouse Name: N/A    Number of Children: N/A  . Years of Education: N/A   Occupational History  . employed     textile  worker   Social History Main Topics  . Smoking status: Never Smoker   . Smokeless tobacco: Not on file  . Alcohol Use: No  . Drug Use: Not on file  . Sexual Activity: Not on file   Other Topics Concern  . Not on file   Social History Narrative  . No narrative on file     BP 126/73  Pulse 71  Ht 5' 4" (1.626 m)  Wt 166 lb (75.297 kg)  BMI 28.48 kg/m2  Physical Exam:  Well appearing middle-aged woman, NAD HEENT: Unremarkable Neck:  No JVD, no thyromegally Lungs:  Clear with no wheezes, rales, or rhonchi. Well-healed pacemaker incision HEART:  Regular rate rhythm, no murmurs, no rubs, no clicks Abd:  soft, positive bowel sounds, no organomegally, no rebound, no guarding Ext:  2 plus pulses, no edema, no cyanosis, no clubbing Skin:  No rashes no nodules Neuro:  CN II through XII intact, motor grossly intact  DEVICE    See PaceArt for details. Device is at ERI and reverted to VVI mode at 65/min.  Assess/Plan:  

## 2014-05-04 NOTE — Patient Instructions (Signed)
Will call back to schedule battery replacement of pacemaker

## 2014-05-05 ENCOUNTER — Telehealth: Payer: Self-pay | Admitting: Internal Medicine

## 2014-05-05 DIAGNOSIS — Z45018 Encounter for adjustment and management of other part of cardiac pacemaker: Secondary | ICD-10-CM

## 2014-05-05 DIAGNOSIS — R001 Bradycardia, unspecified: Secondary | ICD-10-CM

## 2014-05-05 LAB — MDC_IDC_ENUM_SESS_TYPE_INCLINIC
Battery Impedance: 7484 Ohm
Battery Remaining Longevity: 1 mo
Lead Channel Impedance Value: 67 Ohm
Lead Channel Pacing Threshold Amplitude: 0.5 V
Lead Channel Sensing Intrinsic Amplitude: 8 mV
MDC IDC MSMT BATTERY VOLTAGE: 2.58 V
MDC IDC MSMT LEADCHNL RV IMPEDANCE VALUE: 565 Ohm
MDC IDC MSMT LEADCHNL RV PACING THRESHOLD PULSEWIDTH: 0.4 ms
MDC IDC SESS DTM: 20150519202648
MDC IDC SET LEADCHNL RV PACING AMPLITUDE: 2.5 V
MDC IDC SET LEADCHNL RV PACING PULSEWIDTH: 0.4 ms
MDC IDC SET LEADCHNL RV SENSING SENSITIVITY: 2.8 mV
MDC IDC STAT BRADY RV PERCENT PACED: 0 %

## 2014-05-05 NOTE — Telephone Encounter (Signed)
New problem   Pt stated she was told to call you back today. Please call pt.

## 2014-05-07 ENCOUNTER — Encounter (HOSPITAL_COMMUNITY): Payer: Self-pay | Admitting: Pharmacy Technician

## 2014-05-11 ENCOUNTER — Encounter: Payer: Self-pay | Admitting: *Deleted

## 2014-05-11 ENCOUNTER — Other Ambulatory Visit: Payer: Self-pay | Admitting: *Deleted

## 2014-05-11 DIAGNOSIS — Z45018 Encounter for adjustment and management of other part of cardiac pacemaker: Secondary | ICD-10-CM

## 2014-05-11 DIAGNOSIS — R001 Bradycardia, unspecified: Secondary | ICD-10-CM

## 2014-05-14 ENCOUNTER — Other Ambulatory Visit (INDEPENDENT_AMBULATORY_CARE_PROVIDER_SITE_OTHER): Payer: BC Managed Care – PPO

## 2014-05-14 DIAGNOSIS — I498 Other specified cardiac arrhythmias: Secondary | ICD-10-CM

## 2014-05-14 DIAGNOSIS — Z45018 Encounter for adjustment and management of other part of cardiac pacemaker: Secondary | ICD-10-CM

## 2014-05-14 DIAGNOSIS — R001 Bradycardia, unspecified: Secondary | ICD-10-CM

## 2014-05-14 LAB — BASIC METABOLIC PANEL
BUN: 9 mg/dL (ref 6–23)
CO2: 28 mEq/L (ref 19–32)
Calcium: 9.2 mg/dL (ref 8.4–10.5)
Chloride: 101 mEq/L (ref 96–112)
Creatinine, Ser: 0.6 mg/dL (ref 0.4–1.2)
GFR: 99.74 mL/min (ref >60.00)
Glucose, Bld: 157 mg/dL — ABNORMAL HIGH (ref 70–99)
POTASSIUM: 3.5 meq/L (ref 3.5–5.1)
SODIUM: 139 meq/L (ref 135–145)

## 2014-05-14 LAB — CBC WITH DIFFERENTIAL/PLATELET
Basophils Absolute: 0 10*3/uL (ref 0.0–0.1)
Basophils Relative: 0.3 % (ref 0.0–3.0)
Eosinophils Absolute: 0.2 10*3/uL (ref 0.0–0.7)
Eosinophils Relative: 2 % (ref 0.0–5.0)
HEMATOCRIT: 39 % (ref 36.0–46.0)
HEMOGLOBIN: 13.1 g/dL (ref 12.0–15.0)
LYMPHS ABS: 2.2 10*3/uL (ref 0.7–4.0)
Lymphocytes Relative: 28.6 % (ref 12.0–46.0)
MCHC: 33.7 g/dL (ref 30.0–36.0)
MCV: 91.2 fl (ref 78.0–100.0)
MONOS PCT: 4.5 % (ref 3.0–12.0)
Monocytes Absolute: 0.3 10*3/uL (ref 0.1–1.0)
NEUTROS ABS: 4.9 10*3/uL (ref 1.4–7.7)
Neutrophils Relative %: 64.6 % (ref 43.0–77.0)
Platelets: 204 10*3/uL (ref 150.0–400.0)
RBC: 4.27 Mil/uL (ref 3.87–5.11)
RDW: 13.4 % (ref 11.5–15.5)
WBC: 7.6 10*3/uL (ref 4.0–10.5)

## 2014-05-21 ENCOUNTER — Ambulatory Visit (HOSPITAL_COMMUNITY)
Admission: RE | Admit: 2014-05-21 | Discharge: 2014-05-21 | Disposition: A | Discharge status: Home / Self Care | Payer: BC Managed Care – PPO | Source: Ambulatory Visit | Attending: Internal Medicine | Admitting: Internal Medicine

## 2014-05-21 ENCOUNTER — Encounter (HOSPITAL_COMMUNITY)
Admission: RE | Disposition: A | Discharge status: Home / Self Care | Payer: Self-pay | Source: Ambulatory Visit | Attending: Internal Medicine

## 2014-05-21 DIAGNOSIS — I442 Atrioventricular block, complete: Secondary | ICD-10-CM

## 2014-05-21 DIAGNOSIS — M810 Age-related osteoporosis without current pathological fracture: Secondary | ICD-10-CM | POA: Insufficient documentation

## 2014-05-21 DIAGNOSIS — K449 Diaphragmatic hernia without obstruction or gangrene: Secondary | ICD-10-CM | POA: Insufficient documentation

## 2014-05-21 DIAGNOSIS — I10 Essential (primary) hypertension: Secondary | ICD-10-CM | POA: Insufficient documentation

## 2014-05-21 DIAGNOSIS — Z45018 Encounter for adjustment and management of other part of cardiac pacemaker: Secondary | ICD-10-CM | POA: Insufficient documentation

## 2014-05-21 DIAGNOSIS — K219 Gastro-esophageal reflux disease without esophagitis: Secondary | ICD-10-CM | POA: Insufficient documentation

## 2014-05-21 DIAGNOSIS — Z8542 Personal history of malignant neoplasm of other parts of uterus: Secondary | ICD-10-CM | POA: Insufficient documentation

## 2014-05-21 DIAGNOSIS — E785 Hyperlipidemia, unspecified: Secondary | ICD-10-CM | POA: Insufficient documentation

## 2014-05-21 DIAGNOSIS — F411 Generalized anxiety disorder: Secondary | ICD-10-CM | POA: Insufficient documentation

## 2014-05-21 DIAGNOSIS — I495 Sick sinus syndrome: Secondary | ICD-10-CM | POA: Insufficient documentation

## 2014-05-21 HISTORY — PX: PACEMAKER GENERATOR CHANGE: SHX5481

## 2014-05-21 LAB — SURGICAL PCR SCREEN
MRSA, PCR: NEGATIVE
Staphylococcus aureus: NEGATIVE

## 2014-05-21 SURGERY — PACEMAKER GENERATOR CHANGE
Anesthesia: LOCAL

## 2014-05-21 MED ORDER — CHLORHEXIDINE GLUCONATE 4 % EX LIQD
60.0000 mL | Freq: Once | CUTANEOUS | Status: DC
  Filled 2014-05-21: qty 60

## 2014-05-21 MED ORDER — SODIUM CHLORIDE 0.9 % IV SOLN
INTRAVENOUS | Status: DC
  Administered 2014-05-21: 08:00:00 via INTRAVENOUS

## 2014-05-21 MED ORDER — LIDOCAINE HCL (PF) 1 % IJ SOLN
INTRAMUSCULAR | Status: AC
  Filled 2014-05-21: qty 30

## 2014-05-21 MED ORDER — CEFAZOLIN SODIUM-DEXTROSE 2-3 GM-% IV SOLR
2.0000 g | INTRAVENOUS | Status: DC
  Filled 2014-05-21: qty 50

## 2014-05-21 MED ORDER — MIDAZOLAM HCL 5 MG/5ML IJ SOLN
INTRAMUSCULAR | Status: AC
  Filled 2014-05-21: qty 5

## 2014-05-21 MED ORDER — SODIUM CHLORIDE 0.9 % IR SOLN
80.0000 mg | Status: DC
  Filled 2014-05-21 (×2): qty 2

## 2014-05-21 MED ORDER — MUPIROCIN 2 % EX OINT
TOPICAL_OINTMENT | Freq: Two times a day (BID) | CUTANEOUS | Status: DC
  Administered 2014-05-21: 1 via NASAL
  Filled 2014-05-21: qty 22

## 2014-05-21 MED ORDER — MUPIROCIN 2 % EX OINT
TOPICAL_OINTMENT | CUTANEOUS | Status: AC
  Filled 2014-05-21: qty 22

## 2014-05-21 MED ORDER — FENTANYL CITRATE 0.05 MG/ML IJ SOLN
INTRAMUSCULAR | Status: AC
  Filled 2014-05-21: qty 2

## 2014-05-21 NOTE — Interval H&P Note (Signed)
History and Physical Interval Note:  05/21/2014 10:03 AM  [name]  has presented today for surgery, with the diagnosis of ERI, bradycardia  The various methods of treatment have been discussed with the patient and family. After consideration of risks, benefits and other options for treatment, the patient has consented to  Procedure(s): PACEMAKER GENERATOR CHANGE (N/A) as a surgical intervention .  The patient's history has been reviewed, patient examined, no change in status, stable for surgery.  I have reviewed the patient's chart and labs.  Questions were answered to the patient's satisfaction.     [name]

## 2014-05-21 NOTE — CV Procedure (Signed)
Electrophysiology procedure note  Procedure: Removal of a previous implanted dual-chamber pacemaker and insertion of a new dual-chamber pacemaker  Indication: Status post permanent pacemaker implantation in the past secondary to symptomatic sinus node dysfunction and intermittent complete heart block, with a current device at elective replacement  Description of the procedure: After informed consent was obtained, the patient was taken to the diagnostic electrophysiology laboratory in the fasting state. After the usual preparation and draping, intravenous Versed and fentanyl were used for sedation. 30 cc of lidocaine was infiltrated into the left infraclavicular region. A 5 cm incision was carried out, and electrocautery was utilized to dissect down to the fascial plane. The pacemaker pocket was entered with electrocautery, and the Medtronic dual-chamber pacemaker was removed with gentle traction. The atrial and ventricular leads were evaluated, and found to be working satisfactorily. The new Medtronic dual chamber pacemaker, serial number [id number] was connected to the old atrial and ventricular leads in place back in the subcutaneous pocket. Of note the pocket was revised to accommodate the larger dual-chamber pacemaker. The pocket was irrigated with antibiotic irrigation. Electrocautery was utilized to assure hemostasis. The incision was closed with 2 layers of Vicryl suture.  Complications: No immediate procedure consultations  Conclusion: Successful removal of a previous implanted Medtronic dual-chamber pacemaker which had reached elective replacement, and successful insertion of a new Medtronic dual chamber pacemaker in a patient with a history of symptomatic sinus node dysfunction, and intermittent complete heart block.  Cristopher Peru, M.D.

## 2014-05-21 NOTE — Discharge Instructions (Signed)
Pacemaker Battery Change A pacemaker battery usually lasts 4 to 12 years. Once or twice per year, you will be asked to visit your health care provider to have a full evaluation of your pacemaker. When a battery needs to be replaced, the entire pacemaker is replaced so that you can benefit from new circuitry and any new features that have been added to pacemakers. Most often, this procedure is very simple because the leads are already in place.  There are many things that affect how long a pacemaker battery will last, including:   The age of the pacemaker.   The number of leads (1, 2, or 3).   The pacemaker work load. If the pacemaker is helping the heart more often, the battery will not last as long as it would if the pacemaker did not need to help the heart.   Power (voltage) settings. LET [facility] CARE PROVIDER KNOW ABOUT:   Any allergies you have.   All medicines you are taking, including vitamins, herbs, eye drops, creams, and over-the-counter medicines.   Previous problems you or members of your family have had with the use of anesthetics.   Any blood disorders you have.   Previous surgeries you have had, especially since your last pacemaker placement.   Medical conditions you have.   Possible pregnancy, if applicable.  Symptoms of chest pain, trouble breathing, palpitations, lightheadedness, or feelings of an abnormal or irregular heartbeat. RISKS AND COMPLICATIONS  Generally, this is a safe procedure. However, as with any procedure, complications can occur. Possible complications include:   Bleeding.   Bruising of the skin around where the incision was made.   Pain at the incision site.   Pulling apart of the skin at the incision site.   Infection.   Allergic reaction to anesthetics or other medicines used during the procedure.  Diabetics may have a temporary increase in their blood sugar after any surgical procedure.  BEFORE THE PROCEDURE   Wash  all of the skin around the area of the chest where the pacemaker is located.   Ask your health care provider for help with any medicine adjustments before the pacemaker is replaced.   Unless advised otherwise, do not eat or drink after midnight on the night before the procedure. You may drink water to take your medicine as you normally would or as directed. PROCEDURE   After giving medicine to numb the skin, your health care provider will make a cut to reopen the pocket holding the pacemaker.   The old pacemaker will be disconnected from its leads.   The leads will be tested.   If needed, the leads will be replaced. If the leads are functioning properly, the new pacemaker may be connected to the existing leads.  A heart monitor and the pacemaker programmer will be used to make sure that the new pacemaker is working properly.  The incision site is then closed. A dressing is placed over the pacemaker site. The dressing is removed 24 to 48 hours afterwards. AFTER THE PROCEDURE   You will be taken to a recovery area after the new pacemaker implant is completed. Your vital signs such as blood pressure, heart rate, breathing, and oxygen levels will be monitored.  Your health care provider will tell you when you will need to next test your pacemaker or when to return to the office for follow-up for removal of stitches. Document Released: 03/13/2007 Document Revised: 08/05/2013 Document Reviewed: 06/17/2013 [facility] Patient Information 2014 [place].

## 2014-05-21 NOTE — H&P (View-Only) (Signed)
HPI [name] returns today for followup. She is a pleasant 63 yo woman with a h/o symptomatic bradycardia, s/p PPM. She also has a h/o dyslipidemia. In the interim she has done well. She has reached ERI and at times notes that her heart beats abnormally. No chest pain or sob. No Known Allergies   Current Outpatient Prescriptions  Medication Sig Dispense Refill  . calcium citrate-vitamin D (CITRACAL+D) 315-200 MG-UNIT per tablet Take 1 tablet by mouth daily.        Marland Kitchen ezetimibe (ZETIA) 10 MG tablet Take 1 tablet (10 mg total) by mouth daily.  30 tablet  5  . nebivolol (BYSTOLIC) 5 MG tablet TAKE 1 TABLET (5 MG TOTAL) DAILY  90 tablet  1  . omega-3 acid ethyl esters (LOVAZA) 1 G capsule TAKE (2) CAPSULES TWICE DAILY.  360 capsule  3  . pantoprazole (PROTONIX) 40 MG tablet Take 1 tablet (40 mg total) by mouth daily.  90 tablet  3  . sertraline (ZOLOFT) 50 MG tablet TAKE (1/2) TABLET DAILY.  90 tablet  0   No current facility-administered medications for this visit.     Past Medical History  Diagnosis Date  . Symptomatic bradycardia   . GERD (gastroesophageal reflux disease)     Hiatal Hernia  . Arthritis     of the neck  . History of uterine cancer   . Hyperlipidemia   . Hypertension   . Osteoporosis   . Anxiety   . Colon polyps   . Esophageal stricture   . Pacemaker     ROS:   All systems reviewed and negative except as noted in the HPI.   Past Surgical History  Procedure Laterality Date  . Laparoscopic total hysterectomy    . Lymph node dissection      bilateral pelvic and right-sided periaortic  . Status post pacemaker      metronic [phone]  . Transthoracic echocardiogram  08/30/04  . Cholecystectomy    . Appendectomy    . Tubal ligation       No family history on file.   History   Social History  . Marital Status: Single    Spouse Name: N/A    Number of Children: N/A  . Years of Education: N/A   Occupational History  . employed     Facilities manager   Social History Main Topics  . Smoking status: Never Smoker   . Smokeless tobacco: Not on file  . Alcohol Use: No  . Drug Use: Not on file  . Sexual Activity: Not on file   Other Topics Concern  . Not on file   Social History Narrative  . No narrative on file     BP 126/73  Pulse 71  Ht 5\' 4"  (1.626 m)  Wt 166 lb (75.297 kg)  BMI 28.48 kg/m2  Physical Exam:  Well appearing middle-aged woman, NAD HEENT: Unremarkable Neck:  No JVD, no thyromegally Lungs:  Clear with no wheezes, rales, or rhonchi. Well-healed pacemaker incision HEART:  Regular rate rhythm, no murmurs, no rubs, no clicks Abd:  soft, positive bowel sounds, no organomegally, no rebound, no guarding Ext:  2 plus pulses, no edema, no cyanosis, no clubbing Skin:  No rashes no nodules Neuro:  CN II through XII intact, motor grossly intact  DEVICE    See PaceArt for details. Device is at [facility] and reverted to VVI mode at 65/min.  Assess/Plan:

## 2014-05-31 ENCOUNTER — Ambulatory Visit (INDEPENDENT_AMBULATORY_CARE_PROVIDER_SITE_OTHER): Payer: BC Managed Care – PPO | Admitting: *Deleted

## 2014-05-31 ENCOUNTER — Encounter: Payer: Self-pay | Admitting: Internal Medicine

## 2014-05-31 DIAGNOSIS — I498 Other specified cardiac arrhythmias: Secondary | ICD-10-CM

## 2014-05-31 DIAGNOSIS — R001 Bradycardia, unspecified: Secondary | ICD-10-CM

## 2014-05-31 LAB — MDC_IDC_ENUM_SESS_TYPE_INCLINIC
Battery Voltage: 2.79 V
Brady Statistic AP VP Percent: 0 %
Brady Statistic AP VS Percent: 0 %
Brady Statistic AS VP Percent: 0 %
Brady Statistic AS VS Percent: 100 %
Date Time Interrogation Session: 20150615145856
Lead Channel Impedance Value: 442 Ohm
Lead Channel Pacing Threshold Amplitude: 0.5 V
Lead Channel Pacing Threshold Amplitude: 0.75 V
Lead Channel Pacing Threshold Pulse Width: 0.4 ms
Lead Channel Sensing Intrinsic Amplitude: 1.4 mV
Lead Channel Setting Pacing Amplitude: 2 V
Lead Channel Setting Pacing Amplitude: 2.5 V
MDC IDC MSMT BATTERY IMPEDANCE: 100 Ohm
MDC IDC MSMT BATTERY REMAINING LONGEVITY: 180 mo
MDC IDC MSMT LEADCHNL RV IMPEDANCE VALUE: 590 Ohm
MDC IDC MSMT LEADCHNL RV PACING THRESHOLD PULSEWIDTH: 0.4 ms
MDC IDC MSMT LEADCHNL RV SENSING INTR AMPL: 8 mV
MDC IDC SET LEADCHNL RV PACING PULSEWIDTH: 0.4 ms
MDC IDC SET LEADCHNL RV SENSING SENSITIVITY: 2.8 mV

## 2014-05-31 NOTE — Progress Notes (Signed)
Wound check appointment. Steri-strips removed. Wound without redness or edema. Incision edges approximated, wound well healed. Normal device function. Thresholds, sensing, and impedances consistent with implant measurements. Device programmed at appropriate safety margins. Histogram distribution appropriate for patient and level of activity. No mode switches episodes recorded. 1 high ventricular rate noted x 4 sec @ 70/163---AT. Patient educated about wound care, arm mobility, lifting restrictions. ROV in 3 months with GT.

## 2014-08-24 ENCOUNTER — Encounter: Payer: Self-pay | Admitting: Internal Medicine

## 2014-08-24 ENCOUNTER — Ambulatory Visit (INDEPENDENT_AMBULATORY_CARE_PROVIDER_SITE_OTHER): Payer: BC Managed Care – PPO | Admitting: Internal Medicine

## 2014-08-24 VITALS — BP 120/82 | HR 70 | Ht 64.0 in | Wt 167.4 lb

## 2014-08-24 DIAGNOSIS — I10 Essential (primary) hypertension: Secondary | ICD-10-CM

## 2014-08-24 DIAGNOSIS — Z950 Presence of cardiac pacemaker: Secondary | ICD-10-CM

## 2014-08-24 NOTE — Patient Instructions (Addendum)
Your physician wants you to follow-up in: 9 months with Dr Knox Saliva will receive a reminder letter in the mail two months in advance. If you don't receive a letter, please call our office to schedule the follow-up appointment.  Remote monitoring is used to monitor your Pacemaker or ICD from home. This monitoring reduces the number of office visits required to check your device to one time per year. It allows [place] to keep an eye on the functioning of your device to ensure it is working properly. You are scheduled for a device check from home on 11/25/14. You may send your transmission at any time that day. If you have a wireless device, the transmission will be sent automatically. After your physician reviews your transmission, you will receive a postcard with your next transmission date.

## 2014-08-24 NOTE — Assessment & Plan Note (Signed)
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 Will recheck in several months. 

## 2014-08-24 NOTE — Assessment & Plan Note (Signed)
Her blood pressure is well controlled. No change in meds. 

## 2014-08-24 NOTE — Progress Notes (Signed)
HPI [name] returns today for followup. She is a pleasant 63 yo woman with a h/o symptomatic bradycardia, s/p PPM. She also has a h/o dyslipidemia. In the interim she has undergone PM generator change out. She denies any trouble healing.  No chest pain or sob. No peripheral edema. She is retired and recently painted her kitchen. No Known Allergies   Current Outpatient Prescriptions  Medication Sig Dispense Refill  . calcium citrate-vitamin D (CITRACAL+D) 315-200 MG-UNIT per tablet Take 1 tablet by mouth daily.       Marland Kitchen ezetimibe (ZETIA) 10 MG tablet Take 10 mg by mouth daily.      . nebivolol (BYSTOLIC) 5 MG tablet Take 5 mg by mouth daily.       Marland Kitchen omega-3 acid ethyl esters (LOVAZA) 1 G capsule Take 2 g by mouth daily.       . pantoprazole (PROTONIX) 40 MG tablet Take 40 mg by mouth daily.      . sertraline (ZOLOFT) 50 MG tablet Take 25 mg by mouth daily.        No current facility-administered medications for this visit.     Past Medical History  Diagnosis Date  . Symptomatic bradycardia   . GERD (gastroesophageal reflux disease)     Hiatal Hernia  . Arthritis     of the neck  . History of uterine cancer   . Hyperlipidemia   . Hypertension   . Osteoporosis   . Anxiety   . Colon polyps   . Esophageal stricture   . Pacemaker     ROS:   All systems reviewed and negative except as noted in the HPI.   Past Surgical History  Procedure Laterality Date  . Laparoscopic total hysterectomy    . Lymph node dissection      bilateral pelvic and right-sided periaortic  . Status post pacemaker      metronic [phone]  . Transthoracic echocardiogram  08/30/04  . Cholecystectomy    . Appendectomy    . Tubal ligation       No family history on file.   History   Social History  . Marital Status: Single    Spouse Name: N/A    Number of Children: N/A  . Years of Education: N/A   Occupational History  . employed     Engineer, manufacturing systems   Social History Main Topics  .  Smoking status: Never Smoker   . Smokeless tobacco: Not on file  . Alcohol Use: No  . Drug Use: Not on file  . Sexual Activity: Not on file   Other Topics Concern  . Not on file   Social History Narrative  . No narrative on file     BP 120/82  Pulse 70  Ht 5\' 4"  (1.626 m)  Wt 167 lb 6.4 oz (75.932 kg)  BMI 28.72 kg/m2  Physical Exam:  Well appearing middle-aged woman, NAD HEENT: Unremarkable Neck:  No JVD, no thyromegally Lungs:  Clear with no wheezes, rales, or rhonchi. Well-healed pacemaker incision HEART:  Regular rate rhythm, no murmurs, no rubs, no clicks Abd:  soft, positive bowel sounds, no organomegally, no rebound, no guarding Ext:  2 plus pulses, no edema, no cyanosis, no clubbing Skin:  No rashes no nodules Neuro:  CN II through XII intact, motor grossly intact  DEVICE    See PaceArt for details. Normal DDD PM function.  Assess/Plan:

## 2014-08-25 LAB — MDC_IDC_ENUM_SESS_TYPE_INCLINIC
Battery Impedance: 100 Ohm
Battery Remaining Longevity: 164 mo
Battery Voltage: 2.79 V
Brady Statistic AP VP Percent: 0 %
Brady Statistic AP VS Percent: 1 %
Brady Statistic AS VP Percent: 0 %
Brady Statistic AS VS Percent: 99 %
Date Time Interrogation Session: 20150908161408
Lead Channel Impedance Value: 442 Ohm
Lead Channel Impedance Value: 519 Ohm
Lead Channel Pacing Threshold Amplitude: 0.5 V
Lead Channel Pacing Threshold Amplitude: 0.5 V
Lead Channel Pacing Threshold Pulse Width: 0.4 ms
Lead Channel Pacing Threshold Pulse Width: 0.4 ms
Lead Channel Sensing Intrinsic Amplitude: 11.2 mV
Lead Channel Sensing Intrinsic Amplitude: 2.8 mV
Lead Channel Setting Pacing Amplitude: 2 V
Lead Channel Setting Pacing Amplitude: 2.5 V
Lead Channel Setting Pacing Pulse Width: 0.4 ms
Lead Channel Setting Sensing Sensitivity: 4 mV

## 2014-09-27 ENCOUNTER — Other Ambulatory Visit: Payer: Self-pay

## 2014-09-27 MED ORDER — NEBIVOLOL HCL 5 MG PO TABS: 5.0000 mg | ORAL_TABLET | Freq: Every day | ORAL | Status: DC

## 2014-11-15 ENCOUNTER — Other Ambulatory Visit: Payer: Self-pay | Admitting: Family

## 2014-11-15 MED ORDER — EZETIMIBE 10 MG PO TABS: 10.0000 mg | ORAL_TABLET | Freq: Every day | ORAL | Status: DC

## 2014-11-15 NOTE — Telephone Encounter (Signed)
Lm, script sent in. NTBS

## 2014-11-18 ENCOUNTER — Telehealth: Payer: Self-pay | Admitting: Family Medicine

## 2014-11-18 MED ORDER — EZETIMIBE 10 MG PO TABS: 10.0000 mg | ORAL_TABLET | Freq: Every day | ORAL | Status: DC

## 2014-11-18 NOTE — Telephone Encounter (Signed)
Appointment given for 1/29 with Evelina Dun, FNP

## 2014-11-25 ENCOUNTER — Encounter (HOSPITAL_COMMUNITY): Payer: Self-pay | Admitting: Internal Medicine

## 2014-11-25 ENCOUNTER — Ambulatory Visit (INDEPENDENT_AMBULATORY_CARE_PROVIDER_SITE_OTHER): Payer: BC Managed Care – PPO | Admitting: *Deleted

## 2014-11-25 ENCOUNTER — Encounter: Payer: Self-pay | Admitting: Internal Medicine

## 2014-11-25 DIAGNOSIS — R001 Bradycardia, unspecified: Secondary | ICD-10-CM

## 2014-11-25 NOTE — Progress Notes (Signed)
Remote pacemaker transmission.   

## 2014-12-01 LAB — MDC_IDC_ENUM_SESS_TYPE_REMOTE
Battery Remaining Longevity: 164 mo
Brady Statistic AP VP Percent: 0 %
Brady Statistic AP VS Percent: 0 %
Brady Statistic AS VP Percent: 0 %
Date Time Interrogation Session: 20151210173502
Lead Channel Impedance Value: 425 Ohm
Lead Channel Pacing Threshold Amplitude: 0.375 V
Lead Channel Pacing Threshold Amplitude: 0.75 V
Lead Channel Pacing Threshold Pulse Width: 0.4 ms
Lead Channel Pacing Threshold Pulse Width: 0.4 ms
Lead Channel Sensing Intrinsic Amplitude: 2.8 mV
Lead Channel Sensing Intrinsic Amplitude: 22.4 mV
Lead Channel Setting Pacing Pulse Width: 0.4 ms
MDC IDC MSMT BATTERY IMPEDANCE: 100 Ohm
MDC IDC MSMT BATTERY VOLTAGE: 2.79 V
MDC IDC MSMT LEADCHNL RV IMPEDANCE VALUE: 573 Ohm
MDC IDC SET LEADCHNL RA PACING AMPLITUDE: 2 V
MDC IDC SET LEADCHNL RV PACING AMPLITUDE: 2.5 V
MDC IDC SET LEADCHNL RV SENSING SENSITIVITY: 4 mV
MDC IDC STAT BRADY AS VS PERCENT: 100 %

## 2014-12-03 ENCOUNTER — Encounter: Payer: Self-pay | Admitting: Cardiology

## 2014-12-25 ENCOUNTER — Ambulatory Visit (INDEPENDENT_AMBULATORY_CARE_PROVIDER_SITE_OTHER): Payer: BLUE CROSS/BLUE SHIELD | Admitting: General Practice

## 2014-12-25 VITALS — BP 137/89 | HR 89 | Temp 98.5°F | Ht 64.0 in | Wt 166.0 lb

## 2014-12-25 DIAGNOSIS — J0100 Acute maxillary sinusitis, unspecified: Secondary | ICD-10-CM

## 2014-12-25 MED ORDER — AZITHROMYCIN 250 MG PO TABS: ORAL_TABLET | ORAL | Status: DC

## 2014-12-25 NOTE — Progress Notes (Signed)
   Subjective:    Patient ID: [name], female    DOB: 20-Mar-1951, 64 y.o.   MRN: [id number]  Sinusitis This is a new problem. The current episode started in the past 7 days. The problem has been gradually worsening since onset. There has been no fever. Her pain is at a severity of 3/10. Associated symptoms include coughing, headaches and sinus pressure. Pertinent negatives include no chills, shortness of breath or sore throat. Treatments tried: advil  The treatment provided no relief.  Cough Associated symptoms include headaches. Pertinent negatives include no chest pain, chills, fever, sore throat or shortness of breath.  Headache  Associated symptoms include coughing and sinus pressure. Pertinent negatives include no abdominal pain, dizziness, fever, sore throat or weakness.       Review of Systems  Constitutional: Negative for fever and chills.  HENT: Positive for sinus pressure. Negative for sore throat.   Respiratory: Positive for cough. Negative for chest tightness and shortness of breath.   Cardiovascular: Negative for chest pain and palpitations.  Gastrointestinal: Negative for abdominal pain.  Neurological: Positive for headaches. Negative for dizziness and weakness.       Objective:   Physical Exam  Constitutional: She is oriented to person, place, and time. She appears well-developed and well-nourished.  HENT:  Head: Normocephalic and atraumatic.  Right Ear: External ear normal.  Left Ear: External ear normal.  Nose: Right sinus exhibits maxillary sinus tenderness. Left sinus exhibits maxillary sinus tenderness.  Cardiovascular: Normal rate, regular rhythm and normal heart sounds.   Pulmonary/Chest: Effort normal and breath sounds normal. No respiratory distress. She exhibits no tenderness.  Neurological: She is alert and oriented to person, place, and time.  Skin: Skin is warm and dry.  Psychiatric: She has a normal mood and affect.          Assessment &  Plan:  1. Acute maxillary sinusitis, recurrence not specified  -azithromycin 250mg , take as directed --push po fluids -Continue antibiotics even if feeling better -Proper handwashing -RTO if symptoms worsen or unresolved or seek emergency medical treatment -Patient verbalized understanding Erby Pian, FNP-C

## 2014-12-25 NOTE — Patient Instructions (Signed)
Sinusitis Sinusitis is redness, soreness, and inflammation of the paranasal sinuses. Paranasal sinuses are air pockets within the bones of your face (beneath the eyes, the middle of the forehead, or above the eyes). In healthy paranasal sinuses, mucus is able to drain out, and air is able to circulate through them by way of your nose. However, when your paranasal sinuses are inflamed, mucus and air can become trapped. This can allow bacteria and other germs to grow and cause infection. Sinusitis can develop quickly and last only a short time (acute) or continue over a long period (chronic). Sinusitis that lasts for more than 12 weeks is considered chronic.  CAUSES  Causes of sinusitis include:  Allergies.  Structural abnormalities, such as displacement of the cartilage that separates your nostrils (deviated septum), which can decrease the air flow through your nose and sinuses and affect sinus drainage.  Functional abnormalities, such as when the small hairs (cilia) that line your sinuses and help remove mucus do not work properly or are not present. SIGNS AND SYMPTOMS  Symptoms of acute and chronic sinusitis are the same. The primary symptoms are pain and pressure around the affected sinuses. Other symptoms include:  Upper toothache.  Earache.  Headache.  Bad breath.  Decreased sense of smell and taste.  A cough, which worsens when you are lying flat.  Fatigue.  Fever.  Thick drainage from your nose, which often is green and may contain pus (purulent).  Swelling and warmth over the affected sinuses. DIAGNOSIS  Your health care provider will perform a physical exam. During the exam, your health care provider may:  Look in your nose for signs of abnormal growths in your nostrils (nasal polyps).  Tap over the affected sinus to check for signs of infection.  View the inside of your sinuses (endoscopy) using an imaging device that has a light attached (endoscope). If your health  care provider suspects that you have chronic sinusitis, one or more of the following tests may be recommended:  Allergy tests.  Nasal culture. A sample of mucus is taken from your nose, sent to a lab, and screened for bacteria.  Nasal cytology. A sample of mucus is taken from your nose and examined by your health care provider to determine if your sinusitis is related to an allergy. TREATMENT  Most cases of acute sinusitis are related to a viral infection and will resolve on their own within 10 days. Sometimes medicines are prescribed to help relieve symptoms (pain medicine, decongestants, nasal steroid sprays, or saline sprays).  However, for sinusitis related to a bacterial infection, your health care provider will prescribe antibiotic medicines. These are medicines that will help kill the bacteria causing the infection.  Rarely, sinusitis is caused by a fungal infection. In theses cases, your health care provider will prescribe antifungal medicine. For some cases of chronic sinusitis, surgery is needed. Generally, these are cases in which sinusitis recurs more than 3 times per year, despite other treatments. HOME CARE INSTRUCTIONS   Drink plenty of water. Water helps thin the mucus so your sinuses can drain more easily.  Use a humidifier.  Inhale steam 3 to 4 times a day (for example, sit in the bathroom with the shower running).  Apply a warm, moist washcloth to your face 3 to 4 times a day, or as directed by your health care provider.  Use saline nasal sprays to help moisten and clean your sinuses.  Take medicines only as directed by your health care provider.    If you were prescribed either an antibiotic or antifungal medicine, finish it all even if you start to feel better. SEEK IMMEDIATE MEDICAL CARE IF:  You have increasing pain or severe headaches.  You have nausea, vomiting, or drowsiness.  You have swelling around your face.  You have vision problems.  You have a stiff  neck.  You have difficulty breathing. MAKE SURE YOU:   Understand these instructions.  Will watch your condition.  Will get help right away if you are not doing well or get worse. Document Released: 12/03/2005 Document Revised: 04/19/2014 Document Reviewed: 12/18/2011 [facility] Patient Information 2015 [place], [place]. This information is not intended to replace advice given to you by your health care provider. Make sure you discuss any questions you have with your health care provider.  Upper Respiratory Infection, Adult An upper respiratory infection (URI) is also sometimes known as the common cold. The upper respiratory tract includes the nose, sinuses, throat, trachea, and bronchi. Bronchi are the airways leading to the lungs. Most people improve within 1 week, but symptoms can last up to 2 weeks. A residual cough may last even longer.  CAUSES Many different viruses can infect the tissues lining the upper respiratory tract. The tissues become irritated and inflamed and often become very moist. Mucus production is also common. A cold is contagious. You can easily spread the virus to others by oral contact. This includes kissing, sharing a glass, coughing, or sneezing. Touching your mouth or nose and then touching a surface, which is then touched by another person, can also spread the virus. SYMPTOMS  Symptoms typically develop 1 to 3 days after you come in contact with a cold virus. Symptoms vary from person to person. They may include:  Runny nose.  Sneezing.  Nasal congestion.  Sinus irritation.  Sore throat.  Loss of voice (laryngitis).  Cough.  Fatigue.  Muscle aches.  Loss of appetite.  Headache.  Low-grade fever. DIAGNOSIS  You might diagnose your own cold based on familiar symptoms, since most people get a cold 2 to 3 times a year. Your caregiver can confirm this based on your exam. Most importantly, your caregiver can check that your symptoms are not due to  another disease such as strep throat, sinusitis, pneumonia, asthma, or epiglottitis. Blood tests, throat tests, and X-rays are not necessary to diagnose a common cold, but they may sometimes be helpful in excluding other more serious diseases. Your caregiver will decide if any further tests are required. RISKS AND COMPLICATIONS  You may be at risk for a more severe case of the common cold if you smoke cigarettes, have chronic heart disease (such as heart failure) or lung disease (such as asthma), or if you have a weakened immune system. The very young and very old are also at risk for more serious infections. Bacterial sinusitis, middle ear infections, and bacterial pneumonia can complicate the common cold. The common cold can worsen asthma and chronic obstructive pulmonary disease (COPD). Sometimes, these complications can require emergency medical care and may be life-threatening. PREVENTION  The best way to protect against getting a cold is to practice good hygiene. Avoid oral or hand contact with people with cold symptoms. Wash your hands often if contact occurs. There is no clear evidence that vitamin C, vitamin E, echinacea, or exercise reduces the chance of developing a cold. However, it is always recommended to get plenty of rest and practice good nutrition. TREATMENT  Treatment is directed at relieving symptoms. There is no  cure. Antibiotics are not effective, because the infection is caused by a virus, not by bacteria. Treatment may include:  Increased fluid intake. Sports drinks offer valuable electrolytes, sugars, and fluids.  Breathing heated mist or steam (vaporizer or shower).  Eating chicken soup or other clear broths, and maintaining good nutrition.  Getting plenty of rest.  Using gargles or lozenges for comfort.  Controlling fevers with ibuprofen or acetaminophen as directed by your caregiver.  Increasing usage of your inhaler if you have asthma. Zinc gel and zinc lozenges,  taken in the first 24 hours of the common cold, can shorten the duration and lessen the severity of symptoms. Pain medicines may help with fever, muscle aches, and throat pain. A variety of non-prescription medicines are available to treat congestion and runny nose. Your caregiver can make recommendations and may suggest nasal or lung inhalers for other symptoms.  HOME CARE INSTRUCTIONS   Only take over-the-counter or prescription medicines for pain, discomfort, or fever as directed by your caregiver.  Use a warm mist humidifier or inhale steam from a shower to increase air moisture. This may keep secretions moist and make it easier to breathe.  Drink enough water and fluids to keep your urine clear or pale yellow.  Rest as needed.  Return to work when your temperature has returned to normal or as your caregiver advises. You may need to stay home longer to avoid infecting others. You can also use a face mask and careful hand washing to prevent spread of the virus. SEEK MEDICAL CARE IF:   After the first few days, you feel you are getting worse rather than better.  You need your caregiver's advice about medicines to control symptoms.  You develop chills, worsening shortness of breath, or brown or red sputum. These may be signs of pneumonia.  You develop yellow or brown nasal discharge or pain in the face, especially when you bend forward. These may be signs of sinusitis.  You develop a fever, swollen neck glands, pain with swallowing, or white areas in the back of your throat. These may be signs of strep throat. SEEK IMMEDIATE MEDICAL CARE IF:   You have a fever.  You develop severe or persistent headache, ear pain, sinus pain, or chest pain.  You develop wheezing, a prolonged cough, cough up blood, or have a change in your usual mucus (if you have chronic lung disease).  You develop sore muscles or a stiff neck. Document Released: 05/29/2001 Document Revised: 02/25/2012 Document  Reviewed: 03/10/2014 [facility] Patient Information 2015 [place], [place]. This information is not intended to replace advice given to you by your health care provider. Make sure you discuss any questions you have with your health care provider.

## 2014-12-28 ENCOUNTER — Telehealth: Payer: Self-pay | Admitting: *Deleted

## 2014-12-28 NOTE — Telephone Encounter (Signed)
PA for Bystolic sent via CoverMyMeds

## 2015-01-03 ENCOUNTER — Telehealth: Payer: Self-pay | Admitting: Internal Medicine

## 2015-01-03 NOTE — Telephone Encounter (Signed)
New Msg        Pt is calling to get a fax from Dr. Lovena Le or Nurse in order to get BP meds filled by [facility]. Pt only has one pill left.     Please call pt.

## 2015-01-03 NOTE — Telephone Encounter (Signed)
I spoke with pt & she only had 1 of her Nebivolol 5mg  tablets left.  I offered her samples & have placed them at the desk today.  I will forward this to refill re:? precert for medication And to [facility].  Horton Chin RN

## 2015-01-05 NOTE — Telephone Encounter (Signed)
They will send a fax to [phone] with a form to fill out and be sent with clinicals

## 2015-01-13 NOTE — Telephone Encounter (Signed)
Sent with last office note

## 2015-01-14 ENCOUNTER — Ambulatory Visit (INDEPENDENT_AMBULATORY_CARE_PROVIDER_SITE_OTHER): Payer: BLUE CROSS/BLUE SHIELD | Admitting: Family

## 2015-01-14 ENCOUNTER — Encounter: Payer: Self-pay | Admitting: Family

## 2015-01-14 VITALS — BP 135/84 | HR 78 | Temp 97.1°F | Ht 64.0 in | Wt 165.0 lb

## 2015-01-14 DIAGNOSIS — K219 Gastro-esophageal reflux disease without esophagitis: Secondary | ICD-10-CM

## 2015-01-14 DIAGNOSIS — Z1321 Encounter for screening for nutritional disorder: Secondary | ICD-10-CM

## 2015-01-14 DIAGNOSIS — M1990 Unspecified osteoarthritis, unspecified site: Secondary | ICD-10-CM

## 2015-01-14 DIAGNOSIS — I10 Essential (primary) hypertension: Secondary | ICD-10-CM

## 2015-01-14 DIAGNOSIS — F419 Anxiety disorder, unspecified: Secondary | ICD-10-CM

## 2015-01-14 DIAGNOSIS — E785 Hyperlipidemia, unspecified: Secondary | ICD-10-CM

## 2015-01-14 DIAGNOSIS — M810 Age-related osteoporosis without current pathological fracture: Secondary | ICD-10-CM

## 2015-01-14 MED ORDER — PANTOPRAZOLE SODIUM 40 MG PO TBEC: 40.0000 mg | DELAYED_RELEASE_TABLET | Freq: Every day | ORAL | Status: DC

## 2015-01-14 MED ORDER — EZETIMIBE 10 MG PO TABS: 10.0000 mg | ORAL_TABLET | Freq: Every day | ORAL | Status: DC

## 2015-01-14 MED ORDER — OMEGA-3-ACID ETHYL ESTERS 1 G PO CAPS
2.0000 g | ORAL_CAPSULE | Freq: Every day | ORAL | Status: DC
Start: 2015-01-14 — End: 2016-08-23

## 2015-01-14 MED ORDER — SERTRALINE HCL 50 MG PO TABS: 25.0000 mg | ORAL_TABLET | Freq: Every day | ORAL | Status: DC

## 2015-01-14 NOTE — Patient Instructions (Addendum)
Check with insurance company about coverage of shingles vaccine (Zostavax) Health Maintenance Adopting a healthy lifestyle and getting preventive care can go a long way to promote health and wellness. Talk with your health care provider about what schedule of regular examinations is right for you. This is a good chance for you to check in with your provider about disease prevention and staying healthy. In between checkups, there are plenty of things you can do on your own. Experts have done a lot of research about which lifestyle changes and preventive measures are most likely to keep you healthy. Ask your health care provider for more information. WEIGHT AND DIET  Eat a healthy diet  Be sure to include plenty of vegetables, fruits, low-fat dairy products, and lean protein.  Do not eat a lot of foods high in solid fats, added sugars, or salt.  Get regular exercise. This is one of the most important things you can do for your health.  Most adults should exercise for at least 150 minutes each week. The exercise should increase your heart rate and make you sweat (moderate-intensity exercise).  Most adults should also do strengthening exercises at least twice a week. This is in addition to the moderate-intensity exercise.  Maintain a healthy weight  Body mass index (BMI) is a measurement that can be used to identify possible weight problems. It estimates body fat based on height and weight. Your health care provider can help determine your BMI and help you achieve or maintain a healthy weight.  For females 8 years of age and older:   A BMI below 18.5 is considered underweight.  A BMI of 18.5 to 24.9 is normal.  A BMI of 25 to 29.9 is considered overweight.  A BMI of 30 and above is considered obese.  Watch levels of cholesterol and blood lipids  You should start having your blood tested for lipids and cholesterol at 64 years of age, then have this test every 5 years.  You may need to  have your cholesterol levels checked more often if:  Your lipid or cholesterol levels are high.  You are older than 64 years of age.  You are at high risk for heart disease.  CANCER SCREENING   Lung Cancer  Lung cancer screening is recommended for adults 51-5 years old who are at high risk for lung cancer because of a history of smoking.  A yearly low-dose CT scan of the lungs is recommended for people who:  Currently smoke.  Have quit within the past 15 years.  Have at least a 30-pack-year history of smoking. A pack year is smoking an average of one pack of cigarettes a day for 1 year.  Yearly screening should continue until it has been 15 years since you quit.  Yearly screening should stop if you develop a health problem that would prevent you from having lung cancer treatment.  Breast Cancer  Practice breast self-awareness. This means understanding how your breasts normally appear and feel.  It also means doing regular breast self-exams. Let your health care provider know about any changes, no matter how small.  If you are in your 20s or 30s, you should have a clinical breast exam (CBE) by a health care provider every 1-3 years as part of a regular health exam.  If you are 29 or older, have a CBE every year. Also consider having a breast X-ray (mammogram) every year.  If you have a family history of breast cancer, talk to your  health care provider about genetic screening.  If you are at high risk for breast cancer, talk to your health care provider about having an MRI and a mammogram every year.  Breast cancer gene (BRCA) assessment is recommended for women who have family members with BRCA-related cancers. BRCA-related cancers include:  Breast.  Ovarian.  Tubal.  Peritoneal cancers.  Results of the assessment will determine the need for genetic counseling and BRCA1 and BRCA2 testing. Cervical Cancer Routine pelvic examinations to screen for cervical cancer  are no longer recommended for nonpregnant women who are considered low risk for cancer of the pelvic organs (ovaries, uterus, and vagina) and who do not have symptoms. A pelvic examination may be necessary if you have symptoms including those associated with pelvic infections. Ask your health care provider if a screening pelvic exam is right for you.   The Pap test is the screening test for cervical cancer for women who are considered at risk.  If you had a hysterectomy for a problem that was not cancer or a condition that could lead to cancer, then you no longer need Pap tests.  If you are older than 65 years, and you have had normal Pap tests for the past 10 years, you no longer need to have Pap tests.  If you have had past treatment for cervical cancer or a condition that could lead to cancer, you need Pap tests and screening for cancer for at least 20 years after your treatment.  If you no longer get a Pap test, assess your risk factors if they change (such as having a new sexual partner). This can affect whether you should start being screened again.  Some women have medical problems that increase their chance of getting cervical cancer. If this is the case for you, your health care provider may recommend more frequent screening and Pap tests.  The human papillomavirus (HPV) test is another test that may be used for cervical cancer screening. The HPV test looks for the virus that can cause cell changes in the cervix. The cells collected during the Pap test can be tested for HPV.  The HPV test can be used to screen women 30 years of age and older. Getting tested for HPV can extend the interval between normal Pap tests from three to five years.  An HPV test also should be used to screen women of any age who have unclear Pap test results.  After 64 years of age, women should have HPV testing as often as Pap tests.  Colorectal Cancer  This type of cancer can be detected and often  prevented.  Routine colorectal cancer screening usually begins at 64 years of age and continues through 64 years of age.  Your health care provider may recommend screening at an earlier age if you have risk factors for colon cancer.  Your health care provider may also recommend using home test kits to check for hidden blood in the stool.  A small camera at the end of a tube can be used to examine your colon directly (sigmoidoscopy or colonoscopy). This is done to check for the earliest forms of colorectal cancer.  Routine screening usually begins at age 78.  Direct examination of the colon should be repeated every 5-10 years through 64 years of age. However, you may need to be screened more often if early forms of precancerous polyps or small growths are found. Skin Cancer  Check your skin from head to toe regularly.  Tell your  health care provider about any new moles or changes in moles, especially if there is a change in a mole's shape or color.  Also tell your health care provider if you have a mole that is larger than the size of a pencil eraser.  Always use sunscreen. Apply sunscreen liberally and repeatedly throughout the day.  Protect yourself by wearing long sleeves, pants, a wide-brimmed hat, and sunglasses whenever you are outside. HEART DISEASE, DIABETES, AND HIGH BLOOD PRESSURE   Have your blood pressure checked at least every 1-2 years. High blood pressure causes heart disease and increases the risk of stroke.  If you are between 73 years and 92 years old, ask your health care provider if you should take aspirin to prevent strokes.  Have regular diabetes screenings. This involves taking a blood sample to check your fasting blood sugar level.  If you are at a normal weight and have a low risk for diabetes, have this test once every three years after 63 years of age.  If you are overweight and have a high risk for diabetes, consider being tested at a younger age or more  often. PREVENTING INFECTION  Hepatitis B  If you have a higher risk for hepatitis B, you should be screened for this virus. You are considered at high risk for hepatitis B if:  You were born in a country where hepatitis B is common. Ask your health care provider which countries are considered high risk.  Your parents were born in a high-risk country, and you have not been immunized against hepatitis B (hepatitis B vaccine).  You have HIV or AIDS.  You use needles to inject street drugs.  You live with someone who has hepatitis B.  You have had sex with someone who has hepatitis B.  You get hemodialysis treatment.  You take certain medicines for conditions, including cancer, organ transplantation, and autoimmune conditions. Hepatitis C  Blood testing is recommended for:  Everyone born from [birth year] through 1965.  Anyone with known risk factors for hepatitis C. Sexually transmitted infections (STIs)  You should be screened for sexually transmitted infections (STIs) including gonorrhea and chlamydia if:  You are sexually active and are younger than 64 years of age.  You are older than 64 years of age and your health care provider tells you that you are at risk for this type of infection.  Your sexual activity has changed since you were last screened and you are at an increased risk for chlamydia or gonorrhea. Ask your health care provider if you are at risk.  If you do not have HIV, but are at risk, it may be recommended that you take a prescription medicine daily to prevent HIV infection. This is called pre-exposure prophylaxis (PrEP). You are considered at risk if:  You are sexually active and do not regularly use condoms or know the HIV status of your partner(s).  You take drugs by injection.  You are sexually active with a partner who has HIV. Talk with your health care provider about whether you are at high risk of being infected with HIV. If you choose to begin PrEP, you  should first be tested for HIV. You should then be tested every 3 months for as long as you are taking PrEP.  PREGNANCY   If you are premenopausal and you may become pregnant, ask your health care provider about preconception counseling.  If you may become pregnant, take 400 to 800 micrograms (mcg) of folic acid every day.  If you want to prevent pregnancy, talk to your health care provider about birth control (contraception). OSTEOPOROSIS AND MENOPAUSE   Osteoporosis is a disease in which the bones lose minerals and strength with aging. This can result in serious bone fractures. Your risk for osteoporosis can be identified using a bone density scan.  If you are 7 years of age or older, or if you are at risk for osteoporosis and fractures, ask your health care provider if you should be screened.  Ask your health care provider whether you should take a calcium or vitamin D supplement to lower your risk for osteoporosis.  Menopause may have certain physical symptoms and risks.  Hormone replacement therapy may reduce some of these symptoms and risks. Talk to your health care provider about whether hormone replacement therapy is right for you.  HOME CARE INSTRUCTIONS   Schedule regular health, dental, and eye exams.  Stay current with your immunizations.   Do not use any tobacco products including cigarettes, chewing tobacco, or electronic cigarettes.  If you are pregnant, do not drink alcohol.  If you are breastfeeding, limit how much and how often you drink alcohol.  Limit alcohol intake to no more than 1 drink per day for nonpregnant women. One drink equals 12 ounces of beer, 5 ounces of wine, or 1 ounces of hard liquor.  Do not use street drugs.  Do not share needles.  Ask your health care provider for help if you need support or information about quitting drugs.  Tell your health care provider if you often feel depressed.  Tell your health care provider if you have ever  been abused or do not feel safe at home. Document Released: 06/18/2011 Document Revised: 04/19/2014 Document Reviewed: 11/04/2013 [facility] Patient Information 2015 [place], [place]. This information is not intended to replace advice given to you by your health care provider. Make sure you discuss any questions you have with your health care provider.

## 2015-01-14 NOTE — Telephone Encounter (Signed)
Spoke with pharmacy and let them know I have faxed the clinicals as asked and am still waiting for a response.  I have also given to Dr Lovena Le to address if the Bystolic does not get approved

## 2015-01-14 NOTE — Telephone Encounter (Signed)
Follow up     Calling to check on the status on a prior authorization for bystolic. Patient sees Dr Lovena Le.  Please call and give an update

## 2015-01-14 NOTE — Progress Notes (Signed)
Subjective:    Patient ID: [name], female    DOB: 1951-01-02, 64 y.o.   MRN: [id number]  Hypertension This is a chronic problem. The current episode started more than 1 year ago. The problem has been resolved since onset. The problem is controlled. Associated symptoms include anxiety and palpitations ("some times"). Pertinent negatives include no blurred vision, chest pain, headaches, peripheral edema or shortness of breath. Risk factors for coronary artery disease include dyslipidemia, post-menopausal state and family history. Past treatments include beta blockers. The current treatment provides significant improvement. 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 There is no history of kidney disease, CVA, heart failure or a thyroid problem. There is no history of sleep apnea.  Hyperlipidemia This is a chronic problem. The current episode started more than 1 year ago. The problem is controlled. Recent lipid tests were reviewed and are high. She has no history of diabetes or hypothyroidism. Pertinent negatives include no chest pain, leg pain, myalgias or shortness of breath. Current antihyperlipidemic treatment includes ezetimibe. The current treatment provides significant improvement of lipids. Risk factors for coronary artery disease include dyslipidemia, family history, hypertension, obesity and a sedentary lifestyle.  Anxiety Presents for follow-up visit. Symptoms include depressed mood, palpitations ("some times") and suicidal ideas. Patient reports no chest pain, dizziness, excessive worry, hyperventilation, nervous/anxious behavior, panic or shortness of breath. Symptoms occur rarely.   Her past medical history is significant for depression. Past treatments include SSRIs. Compliance with prior treatments has been good.  Gastrophageal Reflux She reports no chest pain, no coughing, no heartburn, no sore throat, no water brash or no wheezing. This is a chronic  problem. The current episode started more than 1 year ago. The problem occurs occasionally. The problem has been resolved. The symptoms are aggravated by certain foods. She has tried a PPI for the symptoms. The treatment provided moderate relief.      Review of Systems  Constitutional: Negative.   HENT: Negative.  Negative for sore throat.   Eyes: Negative.  Negative for blurred vision.  Respiratory: Negative.  Negative for cough, shortness of breath and wheezing.   Cardiovascular: Positive for palpitations ("some times"). Negative for chest pain.  Gastrointestinal: Negative.  Negative for heartburn.  Endocrine: Negative.   Genitourinary: Negative.   Musculoskeletal: Negative.  Negative for myalgias.  Neurological: Negative.  Negative for dizziness and headaches.  Hematological: Negative.   Psychiatric/Behavioral: Positive for suicidal ideas. The patient is not nervous/anxious.   All other systems reviewed and are negative.      Objective:   Physical Exam  Constitutional: She is oriented to person, place, and time. She appears well-developed and well-nourished. No distress.  HENT:  Head: Normocephalic and atraumatic.  Right Ear: External ear normal.  Left Ear: External ear normal.  Nose: Nose normal.  Mouth/Throat: Oropharynx is clear and moist.  Eyes: Pupils are equal, round, and reactive to light.  Neck: Normal range of motion. Neck supple. No thyromegaly present.  Cardiovascular: Normal rate, regular rhythm, normal heart sounds and intact distal pulses.   No murmur heard. Pulmonary/Chest: Effort normal and breath sounds normal. No respiratory distress. She has no wheezes.  Abdominal: Soft. Bowel sounds are normal. She exhibits no distension. There is no tenderness.  Musculoskeletal: Normal range of motion. She exhibits no edema or tenderness.  Neurological: She is alert and oriented to person, place, and time. She has normal reflexes. No cranial nerve deficit.  Skin: Skin is  warm and dry.  Psychiatric: She  has a normal mood and affect. Her behavior is normal. Judgment and thought content normal.  Vitals reviewed.   BP 135/84 mmHg  Pulse 78  Temp(Src) 97.1 F (36.2 C) (Oral)  Ht '5\' 4"'  (1.626 m)  Wt 165 lb (74.844 kg)  BMI 28.31 kg/m2       Assessment & Plan:  1. Essential hypertension - CMP14+EGFR; Future  2. Gastroesophageal reflux disease, esophagitis presence not specified - CMP14+EGFR; Future - pantoprazole (PROTONIX) 40 MG tablet; Take 1 tablet (40 mg total) by mouth daily.  Dispense: 90 tablet; Refill: 4  3. Osteoporosis - CMP14+EGFR; Future - Vit D  25 hydroxy (rtn osteoporosis monitoring); Future  4. Arthritis - CMP14+EGFR; Future  5. Hyperlipidemia - CMP14+EGFR; Future - Lipid panel; Future - ezetimibe (ZETIA) 10 MG tablet; Take 1 tablet (10 mg total) by mouth daily.  Dispense: 90 tablet; Refill: 3 - omega-3 acid ethyl esters (LOVAZA) 1 G capsule; Take 2 capsules (2 g total) by mouth daily.  Dispense: 180 capsule; Refill: 4  6. Anxiety - CMP14+EGFR; Future - sertraline (ZOLOFT) 50 MG tablet; Take 0.5 tablets (25 mg total) by mouth daily.  Dispense: 90 tablet; Refill: 4  7. Encounter for vitamin deficiency screening - Vit D  25 hydroxy (rtn osteoporosis monitoring); Future   Continue all meds Labs pending Health Maintenance reviewed Diet and exercise encouraged RTO 6 months   Evelina Dun, FNP

## 2015-01-19 NOTE — Telephone Encounter (Signed)
Dr Lovena Le says that if not approved may try Carvedilol 3.125mg  twice daily

## 2015-01-20 ENCOUNTER — Other Ambulatory Visit (INDEPENDENT_AMBULATORY_CARE_PROVIDER_SITE_OTHER): Payer: BLUE CROSS/BLUE SHIELD

## 2015-01-20 DIAGNOSIS — M1990 Unspecified osteoarthritis, unspecified site: Secondary | ICD-10-CM

## 2015-01-20 DIAGNOSIS — E785 Hyperlipidemia, unspecified: Secondary | ICD-10-CM

## 2015-01-20 DIAGNOSIS — K219 Gastro-esophageal reflux disease without esophagitis: Secondary | ICD-10-CM

## 2015-01-20 DIAGNOSIS — F419 Anxiety disorder, unspecified: Secondary | ICD-10-CM

## 2015-01-20 DIAGNOSIS — I10 Essential (primary) hypertension: Secondary | ICD-10-CM

## 2015-01-20 DIAGNOSIS — M810 Age-related osteoporosis without current pathological fracture: Secondary | ICD-10-CM

## 2015-01-20 DIAGNOSIS — Z1321 Encounter for screening for nutritional disorder: Secondary | ICD-10-CM

## 2015-01-20 NOTE — Progress Notes (Signed)
Lab only 

## 2015-01-21 ENCOUNTER — Other Ambulatory Visit: Payer: Self-pay | Admitting: Family

## 2015-01-21 DIAGNOSIS — E559 Vitamin D deficiency, unspecified: Secondary | ICD-10-CM

## 2015-01-21 LAB — CMP14+EGFR
ALT: 14 IU/L (ref 0–32)
AST: 13 IU/L (ref 0–40)
Albumin/Globulin Ratio: 1.6 (ref 1.1–2.5)
Albumin: 4.3 g/dL (ref 3.6–4.8)
Alkaline Phosphatase: 76 IU/L (ref 39–117)
BUN/Creatinine Ratio: 16 (ref 11–26)
BUN: 11 mg/dL (ref 8–27)
CALCIUM: 9.4 mg/dL (ref 8.7–10.3)
CO2: 25 mmol/L (ref 18–29)
Chloride: 101 mmol/L (ref 97–108)
Creatinine, Ser: 0.67 mg/dL (ref 0.57–1.00)
GFR calc Af Amer: 108 mL/min/{1.73_m2} (ref >59)
GFR, EST NON AFRICAN AMERICAN: 94 mL/min/{1.73_m2} (ref >59)
GLUCOSE: 115 mg/dL — AB (ref 65–99)
Globulin, Total: 2.7 g/dL (ref 1.5–4.5)
Potassium: 4.2 mmol/L (ref 3.5–5.2)
Sodium: 143 mmol/L (ref 134–144)
Total Bilirubin: 0.4 mg/dL (ref 0.0–1.2)
Total Protein: 7 g/dL (ref 6.0–8.5)

## 2015-01-21 LAB — LIPID PANEL
Chol/HDL Ratio: 4.3 ratio units (ref 0.0–4.4)
Cholesterol, Total: 209 mg/dL — ABNORMAL HIGH (ref 100–199)
HDL: 49 mg/dL (ref >39)
LDL Calculated: 118 mg/dL — ABNORMAL HIGH (ref 0–99)
Triglycerides: 211 mg/dL — ABNORMAL HIGH (ref 0–149)
VLDL CHOLESTEROL CAL: 42 mg/dL — AB (ref 5–40)

## 2015-01-21 LAB — VITAMIN D 25 HYDROXY (VIT D DEFICIENCY, FRACTURES): Vit D, 25-Hydroxy: 27.7 ng/mL — ABNORMAL LOW (ref 30.0–100.0)

## 2015-01-21 MED ORDER — VITAMIN D (ERGOCALCIFEROL) 1.25 MG (50000 UNIT) PO CAPS: 50000.0000 [IU] | ORAL_CAPSULE | ORAL | Status: DC

## 2015-01-31 ENCOUNTER — Telehealth: Payer: Self-pay

## 2015-01-31 ENCOUNTER — Other Ambulatory Visit: Payer: Self-pay

## 2015-01-31 MED ORDER — CARVEDILOL 3.125 MG PO TABS: 3.1250 mg | ORAL_TABLET | Freq: Two times a day (BID) | ORAL | Status: DC

## 2015-01-31 NOTE — Telephone Encounter (Signed)
Pt only has one Bystolic 5 mg tablet left from samples.  Pt made aware that Rx has been called in to [facility] for Coreg 3.125 mg and she is to take it twice a day.  Pt advised to start taking daily blood pressure since making a medication change.  Pt advised to call our office if blood pressure increases and she starts become symptomatic, headaches, light headed, dizziness.  Pt agrees with plan and no questions at this time.

## 2015-02-14 ENCOUNTER — Telehealth: Payer: Self-pay | Admitting: Internal Medicine

## 2015-02-14 NOTE — Telephone Encounter (Signed)
Left message for patient to call me back. 

## 2015-02-14 NOTE — Telephone Encounter (Signed)
New message     Pt c/o medication issue:  1. Name of Medication: carvedilol 3.125 mg  2. How are you currently taking this medication (dosage and times per day)? Patient saying twice a day   3. Are you having a reaction (difficulty breathing--STAT)? Headache, chest pain, body ache. Wake up during the night depression.    4. What is your medication issue? Changed back to bystolic .

## 2015-02-15 NOTE — Telephone Encounter (Signed)
F/U       Pt is returning call from yesterday.     Please call back.

## 2015-02-21 MED ORDER — NEBIVOLOL HCL 5 MG PO TABS: 5.0000 mg | ORAL_TABLET | Freq: Every day | ORAL | Status: DC

## 2015-02-21 NOTE — Telephone Encounter (Signed)
Spoke with patient and she is still not feeling well on the Carvedilol and wishes to go back to Bystolic 5mg    I have sent in the Rx for her and she will call if insurance needs PA

## 2015-02-28 ENCOUNTER — Ambulatory Visit (INDEPENDENT_AMBULATORY_CARE_PROVIDER_SITE_OTHER): Payer: BLUE CROSS/BLUE SHIELD | Admitting: *Deleted

## 2015-02-28 ENCOUNTER — Telehealth: Payer: Self-pay | Admitting: Internal Medicine

## 2015-02-28 ENCOUNTER — Telehealth: Payer: Self-pay | Admitting: Cardiology

## 2015-02-28 DIAGNOSIS — R001 Bradycardia, unspecified: Secondary | ICD-10-CM

## 2015-02-28 NOTE — Telephone Encounter (Signed)
Called to do the PA and was put on hold for 21:20 sec and did not talk to anyone  Will try again tomorrow

## 2015-02-28 NOTE — Telephone Encounter (Signed)
New message       Pt c/o medication issue:  1. Name of Medication: bystolic 2. How are you currently taking this medication (dosage and times per day)? 5mg  3. Are you having a reaction (difficulty breathing--STAT)? no  4. What is your medication issue?  Please call and do a prior approval on rx

## 2015-02-28 NOTE — Telephone Encounter (Signed)
LMOVM reminding pt to send remote transmission.   

## 2015-03-02 LAB — MDC_IDC_ENUM_SESS_TYPE_REMOTE
Battery Voltage: 2.79 V
Brady Statistic AP VS Percent: 0 %
Brady Statistic AS VS Percent: 100 %
Date Time Interrogation Session: 20160314203706
Lead Channel Impedance Value: 559 Ohm
Lead Channel Pacing Threshold Amplitude: 0.875 V
Lead Channel Pacing Threshold Pulse Width: 0.4 ms
Lead Channel Pacing Threshold Pulse Width: 0.4 ms
Lead Channel Sensing Intrinsic Amplitude: 8 mV
Lead Channel Setting Pacing Amplitude: 2.5 V
Lead Channel Setting Pacing Pulse Width: 0.4 ms
Lead Channel Setting Sensing Sensitivity: 5.6 mV
MDC IDC MSMT BATTERY IMPEDANCE: 100 Ohm
MDC IDC MSMT BATTERY REMAINING LONGEVITY: 163 mo
MDC IDC MSMT LEADCHNL RA IMPEDANCE VALUE: 436 Ohm
MDC IDC MSMT LEADCHNL RA PACING THRESHOLD AMPLITUDE: 0.375 V
MDC IDC MSMT LEADCHNL RA SENSING INTR AMPL: 1.4 mV
MDC IDC SET LEADCHNL RA PACING AMPLITUDE: 2 V
MDC IDC STAT BRADY AP VP PERCENT: 0 %
MDC IDC STAT BRADY AS VP PERCENT: 0 %

## 2015-03-02 NOTE — Progress Notes (Signed)
Remote pacemaker transmission.   

## 2015-03-07 ENCOUNTER — Telehealth: Payer: Self-pay | Admitting: *Deleted

## 2015-03-07 NOTE — Telephone Encounter (Signed)
Submitted ePA for Bystolic to [place]. ([id number])  - Pending decision at this time.

## 2015-03-10 ENCOUNTER — Encounter: Payer: Self-pay | Admitting: Cardiology

## 2015-03-18 ENCOUNTER — Encounter: Payer: Self-pay | Admitting: Internal Medicine

## 2015-03-28 NOTE — Telephone Encounter (Signed)
New message   Pharmacy calling    Prior authorization on Bystolic  5 mg .

## 2015-03-31 ENCOUNTER — Encounter: Payer: Self-pay | Admitting: Family

## 2015-03-31 ENCOUNTER — Ambulatory Visit (INDEPENDENT_AMBULATORY_CARE_PROVIDER_SITE_OTHER): Payer: BLUE CROSS/BLUE SHIELD | Admitting: Family

## 2015-03-31 VITALS — BP 148/85 | HR 81 | Temp 96.9°F | Ht 64.0 in | Wt 165.0 lb

## 2015-03-31 DIAGNOSIS — I10 Essential (primary) hypertension: Secondary | ICD-10-CM | POA: Diagnosis not present

## 2015-03-31 MED ORDER — METOPROLOL SUCCINATE ER 25 MG PO TB24: 25.0000 mg | ORAL_TABLET | Freq: Every day | ORAL | Status: DC

## 2015-03-31 NOTE — Progress Notes (Signed)
Subjective:    Patient ID: [name], female    DOB: 08/12/1951, 64 y.o.   MRN: [id number]  Hypertension This is a chronic problem. The current episode started more than 1 year ago. The problem has been waxing and waning since onset. The problem is uncontrolled. Associated symptoms include headaches. Pertinent negatives include no blurred vision, palpitations, peripheral edema or shortness of breath. Risk factors for coronary artery disease include dyslipidemia, post-menopausal state, family history and sedentary lifestyle. Past treatments include beta blockers. The current treatment provides mild improvement. There is no history of kidney disease, CAD/MI, CVA, heart failure or a thyroid problem. There is no history of sleep apnea.   Pt's was on  Nebibolol, but in January pt's insurance denied the RX. Pt states it was working well, but since then she has been placed on Coreg BID. Pt states she can only tolerate taking it once a day because it causes her weakness, fatigue, and drowsiness. Pt has a pacemaker.    Review of Systems  Constitutional: Negative.   Eyes: Negative.  Negative for blurred vision.  Respiratory: Negative.  Negative for shortness of breath.   Cardiovascular: Negative.  Negative for palpitations.  Gastrointestinal: Negative.   Endocrine: Negative.   Genitourinary: Negative.   Musculoskeletal: Negative.   Neurological: Positive for headaches.  Hematological: Negative.   Psychiatric/Behavioral: Negative.   All other systems reviewed and are negative.      Objective:   Physical Exam  Constitutional: She is oriented to person, place, and time. She appears well-developed and well-nourished. No distress.  HENT:  Head: Normocephalic and atraumatic.  Right Ear: External ear normal.  Left Ear: External ear normal.  Nose: Nose normal.  Mouth/Throat: Oropharynx is clear and moist.  Eyes: Pupils are equal, round, and reactive to light.  Neck: Normal range of motion.  Neck supple. No thyromegaly present.  Cardiovascular: Normal rate, regular rhythm, normal heart sounds and intact distal pulses.   No murmur heard. Pulmonary/Chest: Effort normal and breath sounds normal. No respiratory distress. She has no wheezes.  Abdominal: Soft. Bowel sounds are normal. She exhibits no distension. There is no tenderness.  Musculoskeletal: Normal range of motion. She exhibits no edema or tenderness.  Neurological: She is alert and oriented to person, place, and time. She has normal reflexes. No cranial nerve deficit.  Skin: Skin is warm and dry.  Psychiatric: She has a normal mood and affect. Her behavior is normal. Judgment and thought content normal.  Vitals reviewed.     BP 148/85 mmHg  Pulse 81  Temp(Src) 96.9 F (36.1 C) (Oral)  Ht 5' 4" (1.626 m)  Wt 165 lb (74.844 kg)  BMI 28.31 kg/m2     Assessment & Plan:  1. Essential hypertension --Pt's Coreg d/c today r/t side effects -Pt started on Metoprolo XL 25 mg today -Dash diet information given -Exercise encouraged - Stress Management  -Continue current meds -RTO in 2 weeks - metoprolol succinate (TOPROL-XL) 25 MG 24 hr tablet; Take 1 tablet (25 mg total) by mouth daily.  Dispense: 90 tablet; Refill: 3 - BMP8+EGFR  Evelina Dun, FNP

## 2015-03-31 NOTE — Addendum Note (Signed)
Addended by: Earlene Plater on: 03/31/2015 04:38 PM   Modules accepted: Miquel Dunn

## 2015-03-31 NOTE — Patient Instructions (Signed)
DASH Eating Plan DASH stands for "Dietary Approaches to Stop Hypertension." The DASH eating plan is a healthy eating plan that has been shown to reduce high blood pressure (hypertension). Additional health benefits may include reducing the risk of type 2 diabetes mellitus, heart disease, and stroke. The DASH eating plan may also help with weight loss. WHAT DO I NEED TO KNOW ABOUT THE DASH EATING PLAN? For the DASH eating plan, you will follow these general guidelines:  Choose foods with a percent daily value for sodium of less than 5% (as listed on the food label).  Use salt-free seasonings or herbs instead of table salt or sea salt.  Check with your health care provider or pharmacist before using salt substitutes.  Eat lower-sodium products, often labeled as "lower sodium" or "no salt added."  Eat fresh foods.  Eat more vegetables, fruits, and low-fat dairy products.  Choose whole grains. Look for the word "whole" as the first word in the ingredient list.  Choose fish and skinless chicken or turkey more often than red meat. Limit fish, poultry, and meat to 6 oz (170 g) each day.  Limit sweets, desserts, sugars, and sugary drinks.  Choose heart-healthy fats.  Limit cheese to 1 oz (28 g) per day.  Eat more home-cooked food and less restaurant, buffet, and fast food.  Limit fried foods.  Cook foods using methods other than frying.  Limit canned vegetables. If you do use them, rinse them well to decrease the sodium.  When eating at a restaurant, ask that your food be prepared with less salt, or no salt if possible. WHAT FOODS CAN I EAT? Seek help from a dietitian for individual calorie needs. Grains Whole grain or whole wheat bread. Brown rice. Whole grain or whole wheat pasta. Quinoa, bulgur, and whole grain cereals. Low-sodium cereals. Corn or whole wheat flour tortillas. Whole grain cornbread. Whole grain crackers. Low-sodium crackers. Vegetables Fresh or frozen vegetables  (raw, steamed, roasted, or grilled). Low-sodium or reduced-sodium tomato and vegetable juices. Low-sodium or reduced-sodium tomato sauce and paste. Low-sodium or reduced-sodium canned vegetables.  Fruits All fresh, canned (in natural juice), or frozen fruits. Meat and Other Protein Products Ground beef (85% or leaner), grass-fed beef, or beef trimmed of fat. Skinless chicken or turkey. Ground chicken or turkey. Pork trimmed of fat. All fish and seafood. Eggs. Dried beans, peas, or lentils. Unsalted nuts and seeds. Unsalted canned beans. Dairy Low-fat dairy products, such as skim or 1% milk, 2% or reduced-fat cheeses, low-fat ricotta or cottage cheese, or plain low-fat yogurt. Low-sodium or reduced-sodium cheeses. Fats and Oils Tub margarines without trans fats. Light or reduced-fat mayonnaise and salad dressings (reduced sodium). Avocado. Safflower, olive, or canola oils. Natural peanut or almond butter. Other Unsalted popcorn and pretzels. The items listed above may not be a complete list of recommended foods or beverages. Contact your dietitian for more options. WHAT FOODS ARE NOT RECOMMENDED? Grains White bread. White pasta. White rice. Refined cornbread. Bagels and croissants. Crackers that contain trans fat. Vegetables Creamed or fried vegetables. Vegetables in a cheese sauce. Regular canned vegetables. Regular canned tomato sauce and paste. Regular tomato and vegetable juices. Fruits Dried fruits. Canned fruit in light or heavy syrup. Fruit juice. Meat and Other Protein Products Fatty cuts of meat. Ribs, chicken wings, bacon, sausage, bologna, salami, chitterlings, fatback, hot dogs, bratwurst, and packaged luncheon meats. Salted nuts and seeds. Canned beans with salt. Dairy Whole or 2% milk, cream, half-and-half, and cream cheese. Whole-fat or sweetened yogurt. Full-fat   cheeses or blue cheese. Nondairy creamers and whipped toppings. Processed cheese, cheese spreads, or cheese  curds. Condiments Onion and garlic salt, seasoned salt, table salt, and sea salt. Canned and packaged gravies. Worcestershire sauce. Tartar sauce. Barbecue sauce. Teriyaki sauce. Soy sauce, including reduced sodium. Steak sauce. Fish sauce. Oyster sauce. Cocktail sauce. Horseradish. Ketchup and mustard. Meat flavorings and tenderizers. Bouillon cubes. Hot sauce. Tabasco sauce. Marinades. Taco seasonings. Relishes. Fats and Oils Butter, stick margarine, lard, shortening, ghee, and bacon fat. Coconut, palm kernel, or palm oils. Regular salad dressings. Other Pickles and olives. Salted popcorn and pretzels. The items listed above may not be a complete list of foods and beverages to avoid. Contact your dietitian for more information. WHERE CAN I FIND MORE INFORMATION? National Heart, Lung, and Blood Institute: www.nhlbi.nih.gov/health/health-topics/topics/dash/ Document Released: 11/22/2011 Document Revised: 04/19/2014 Document Reviewed: 10/07/2013 Exit[employer] Patient Information 2015 [employer]. This information is not intended to replace advice given to you by your health care provider. Make sure you discuss any questions you have with your health care provider. Hypertension Hypertension, commonly called high blood pressure, is when the force of blood pumping through your arteries is too strong. Your arteries are the blood vessels that carry blood from your heart throughout your body. A blood pressure reading consists of a higher number over a lower number, such as 110/72. The higher number (systolic) is the pressure inside your arteries when your heart pumps. The lower number (diastolic) is the pressure inside your arteries when your heart relaxes. Ideally you want your blood pressure below 120/80. Hypertension forces your heart to work harder to pump blood. Your arteries may become narrow or stiff. Having hypertension puts you at risk for heart disease, stroke, and other problems.  RISK  FACTORS Some risk factors for high blood pressure are controllable. Others are not.  Risk factors you cannot control include:   Race. You may be at higher risk if you are African American.  Age. Risk increases with age.  Gender. Men are at higher risk than women before age 45 years. After age 65, women are at higher risk than men. Risk factors you can control include:  Not getting enough exercise or physical activity.  Being overweight.  Getting too much fat, sugar, calories, or salt in your diet.  Drinking too much alcohol. SIGNS AND SYMPTOMS Hypertension does not usually cause signs or symptoms. Extremely high blood pressure (hypertensive crisis) may cause headache, anxiety, shortness of breath, and nosebleed. DIAGNOSIS  To check if you have hypertension, your health care provider will measure your blood pressure while you are seated, with your arm held at the level of your heart. It should be measured at least twice using the same arm. Certain conditions can cause a difference in blood pressure between your right and left arms. A blood pressure reading that is higher than normal on one occasion does not mean that you need treatment. If one blood pressure reading is high, ask your health care provider about having it checked again. TREATMENT  Treating high blood pressure includes making lifestyle changes and possibly taking medicine. Living a healthy lifestyle can help lower high blood pressure. You may need to change some of your habits. Lifestyle changes may include:  Following the DASH diet. This diet is high in fruits, vegetables, and whole grains. It is low in salt, red meat, and added sugars.  Getting at least 2 hours of brisk physical activity every week.  Losing weight if necessary.  Not smoking.  Limiting   alcoholic beverages.  Learning ways to reduce stress. If lifestyle changes are not enough to get your blood pressure under control, your health care provider may  prescribe medicine. You may need to take more than one. Work closely with your health care provider to understand the risks and benefits. HOME CARE INSTRUCTIONS  Have your blood pressure rechecked as directed by your health care provider.   Take medicines only as directed by your health care provider. Follow the directions carefully. Blood pressure medicines must be taken as prescribed. The medicine does not work as well when you skip doses. Skipping doses also puts you at risk for problems.   Do not smoke.   Monitor your blood pressure at home as directed by your health care provider. SEEK MEDICAL CARE IF:   You think you are having a reaction to medicines taken.  You have recurrent headaches or feel dizzy.  You have swelling in your ankles.  You have trouble with your vision. SEEK IMMEDIATE MEDICAL CARE IF:  You develop a severe headache or confusion.  You have unusual weakness, numbness, or feel faint.  You have severe chest or abdominal pain.  You vomit repeatedly.  You have trouble breathing. MAKE SURE YOU:   Understand these instructions.  Will watch your condition.  Will get help right away if you are not doing well or get worse. Document Released: 12/03/2005 Document Revised: 04/19/2014 Document Reviewed: 09/25/2013 ExitCare Patient Information 2015 [employer]. This information is not intended to replace advice given to you by your health care provider. Make sure you discuss any questions you have with your health care provider.  

## 2015-04-01 LAB — BMP8+EGFR
BUN / CREAT RATIO: 18 (ref 11–26)
BUN: 11 mg/dL (ref 8–27)
CO2: 27 mmol/L (ref 18–29)
Calcium: 9.4 mg/dL (ref 8.7–10.3)
Chloride: 100 mmol/L (ref 97–108)
Creatinine, Ser: 0.6 mg/dL (ref 0.57–1.00)
GFR, EST AFRICAN AMERICAN: 112 mL/min/{1.73_m2} (ref >59)
GFR, EST NON AFRICAN AMERICAN: 97 mL/min/{1.73_m2} (ref >59)
GLUCOSE: 153 mg/dL — AB (ref 65–99)
Potassium: 4 mmol/L (ref 3.5–5.2)
SODIUM: 142 mmol/L (ref 134–144)

## 2015-04-07 NOTE — Telephone Encounter (Signed)
See 03/07/15 phone note

## 2015-04-18 ENCOUNTER — Encounter: Payer: Self-pay | Admitting: Family

## 2015-04-18 ENCOUNTER — Ambulatory Visit (INDEPENDENT_AMBULATORY_CARE_PROVIDER_SITE_OTHER): Payer: BLUE CROSS/BLUE SHIELD | Admitting: Family

## 2015-04-18 VITALS — BP 140/85 | HR 87 | Temp 97.8°F | Ht 64.0 in | Wt 165.0 lb

## 2015-04-18 DIAGNOSIS — I10 Essential (primary) hypertension: Secondary | ICD-10-CM | POA: Diagnosis not present

## 2015-04-18 MED ORDER — AMLODIPINE BESYLATE 5 MG PO TABS: 5.0000 mg | ORAL_TABLET | Freq: Every day | ORAL | Status: DC

## 2015-04-18 NOTE — Progress Notes (Signed)
   Subjective:    Patient ID: [name], female    DOB: Nov 16, 1951, 64 y.o.   MRN: [id number]  Pt presents to the office today to recheck BP. Pt's BP is not at goal today. Pt states she has been checking her BP at home and they have been 139/93 and 140/91 recently. Pt states she feels like the metoprolol is making her feel "achy and moody".  Hypertension This is a chronic problem. The current episode started more than 1 year ago. The problem has been waxing and waning since onset. The problem is uncontrolled. Pertinent negatives include no anxiety, headaches, palpitations, peripheral edema or shortness of breath. Risk factors for coronary artery disease include dyslipidemia, post-menopausal state and family history. Past treatments include beta blockers. The current treatment provides mild improvement. There is no history of kidney disease, CAD/MI, CVA, heart failure or a thyroid problem. There is no history of sleep apnea.      Review of Systems  Constitutional: Negative.   HENT: Negative.   Eyes: Negative.   Respiratory: Negative.  Negative for shortness of breath.   Cardiovascular: Negative.  Negative for palpitations.  Gastrointestinal: Negative.   Endocrine: Negative.   Genitourinary: Negative.   Musculoskeletal: Negative.   Neurological: Negative.  Negative for headaches.  Hematological: Negative.   Psychiatric/Behavioral: Negative.   All other systems reviewed and are negative.      Objective:   Physical Exam  Constitutional: She is oriented to person, place, and time. She appears well-developed and well-nourished. No distress.  HENT:  Head: Normocephalic and atraumatic.  Eyes: Pupils are equal, round, and reactive to light.  Neck: Normal range of motion. Neck supple. No thyromegaly present.  Cardiovascular: Normal rate, regular rhythm, normal heart sounds and intact distal pulses.   No murmur heard. Pulmonary/Chest: Effort normal and breath sounds normal. No  respiratory distress. She has no wheezes.  Abdominal: Soft. Bowel sounds are normal. She exhibits no distension. There is no tenderness.  Musculoskeletal: Normal range of motion. She exhibits no edema or tenderness.  Neurological: She is alert and oriented to person, place, and time. She has normal reflexes. No cranial nerve deficit.  Skin: Skin is warm and dry.  Psychiatric: She has a normal mood and affect. Her behavior is normal. Judgment and thought content normal.  Vitals reviewed.     BP 140/85 mmHg  Pulse 87  Temp(Src) 97.8 F (36.6 C) (Oral)  Ht $R'5\' 4"'Gq$  (1.626 m)  Wt 165 lb (74.844 kg)  BMI 28.31 kg/m2     Assessment & Plan:  1. Essential hypertension --Pt's metoprolol d/c today -Pt started on Norvasc 5 mg today Daily blood pressure log given with instructions on how to fill out and told to bring to next visit -Dash diet information given -Exercise encouraged - Stress Management  -Continue current meds -RTO in 2 weeks - BMP8+EGFR - amLODipine (NORVASC) 5 MG tablet; Take 1 tablet (5 mg total) by mouth daily.  Dispense: 90 tablet; Refill: [place], FNP

## 2015-04-18 NOTE — Patient Instructions (Signed)
DASH Eating Plan DASH stands for "Dietary Approaches to Stop Hypertension." The DASH eating plan is a healthy eating plan that has been shown to reduce high blood pressure (hypertension). Additional health benefits may include reducing the risk of type 2 diabetes mellitus, heart disease, and stroke. The DASH eating plan may also help with weight loss. WHAT DO I NEED TO KNOW ABOUT THE DASH EATING PLAN? For the DASH eating plan, you will follow these general guidelines:  Choose foods with a percent daily value for sodium of less than 5% (as listed on the food label).  Use salt-free seasonings or herbs instead of table salt or sea salt.  Check with your health care provider or pharmacist before using salt substitutes.  Eat lower-sodium products, often labeled as "lower sodium" or "no salt added."  Eat fresh foods.  Eat more vegetables, fruits, and low-fat dairy products.  Choose whole grains. Look for the word "whole" as the first word in the ingredient list.  Choose fish and skinless chicken or turkey more often than red meat. Limit fish, poultry, and meat to 6 oz (170 g) each day.  Limit sweets, desserts, sugars, and sugary drinks.  Choose heart-healthy fats.  Limit cheese to 1 oz (28 g) per day.  Eat more home-cooked food and less restaurant, buffet, and fast food.  Limit fried foods.  Cook foods using methods other than frying.  Limit canned vegetables. If you do use them, rinse them well to decrease the sodium.  When eating at a restaurant, ask that your food be prepared with less salt, or no salt if possible. WHAT FOODS CAN I EAT? Seek help from a dietitian for individual calorie needs. Grains Whole grain or whole wheat bread. Brown rice. Whole grain or whole wheat pasta. Quinoa, bulgur, and whole grain cereals. Low-sodium cereals. Corn or whole wheat flour tortillas. Whole grain cornbread. Whole grain crackers. Low-sodium crackers. Vegetables Fresh or frozen vegetables  (raw, steamed, roasted, or grilled). Low-sodium or reduced-sodium tomato and vegetable juices. Low-sodium or reduced-sodium tomato sauce and paste. Low-sodium or reduced-sodium canned vegetables.  Fruits All fresh, canned (in natural juice), or frozen fruits. Meat and Other Protein Products Ground beef (85% or leaner), grass-fed beef, or beef trimmed of fat. Skinless chicken or turkey. Ground chicken or turkey. Pork trimmed of fat. All fish and seafood. Eggs. Dried beans, peas, or lentils. Unsalted nuts and seeds. Unsalted canned beans. Dairy Low-fat dairy products, such as skim or 1% milk, 2% or reduced-fat cheeses, low-fat ricotta or cottage cheese, or plain low-fat yogurt. Low-sodium or reduced-sodium cheeses. Fats and Oils Tub margarines without trans fats. Light or reduced-fat mayonnaise and salad dressings (reduced sodium). Avocado. Safflower, olive, or canola oils. Natural peanut or almond butter. Other Unsalted popcorn and pretzels. The items listed above may not be a complete list of recommended foods or beverages. Contact your dietitian for more options. WHAT FOODS ARE NOT RECOMMENDED? Grains White bread. White pasta. White rice. Refined cornbread. Bagels and croissants. Crackers that contain trans fat. Vegetables Creamed or fried vegetables. Vegetables in a cheese sauce. Regular canned vegetables. Regular canned tomato sauce and paste. Regular tomato and vegetable juices. Fruits Dried fruits. Canned fruit in light or heavy syrup. Fruit juice. Meat and Other Protein Products Fatty cuts of meat. Ribs, chicken wings, bacon, sausage, bologna, salami, chitterlings, fatback, hot dogs, bratwurst, and packaged luncheon meats. Salted nuts and seeds. Canned beans with salt. Dairy Whole or 2% milk, cream, half-and-half, and cream cheese. Whole-fat or sweetened yogurt. Full-fat   cheeses or blue cheese. Nondairy creamers and whipped toppings. Processed cheese, cheese spreads, or cheese  curds. Condiments Onion and garlic salt, seasoned salt, table salt, and sea salt. Canned and packaged gravies. Worcestershire sauce. Tartar sauce. Barbecue sauce. Teriyaki sauce. Soy sauce, including reduced sodium. Steak sauce. Fish sauce. Oyster sauce. Cocktail sauce. Horseradish. Ketchup and mustard. Meat flavorings and tenderizers. Bouillon cubes. Hot sauce. Tabasco sauce. Marinades. Taco seasonings. Relishes. Fats and Oils Butter, stick margarine, lard, shortening, ghee, and bacon fat. Coconut, palm kernel, or palm oils. Regular salad dressings. Other Pickles and olives. Salted popcorn and pretzels. The items listed above may not be a complete list of foods and beverages to avoid. Contact your dietitian for more information. WHERE CAN I FIND MORE INFORMATION? National Heart, Lung, and Blood Institute: www.nhlbi.nih.gov/health/health-topics/topics/dash/ Document Released: 11/22/2011 Document Revised: 04/19/2014 Document Reviewed: 10/07/2013 Exit[employer] Patient Information 2015 [employer]. This information is not intended to replace advice given to you by your health care provider. Make sure you discuss any questions you have with your health care provider. Hypertension Hypertension, commonly called high blood pressure, is when the force of blood pumping through your arteries is too strong. Your arteries are the blood vessels that carry blood from your heart throughout your body. A blood pressure reading consists of a higher number over a lower number, such as 110/72. The higher number (systolic) is the pressure inside your arteries when your heart pumps. The lower number (diastolic) is the pressure inside your arteries when your heart relaxes. Ideally you want your blood pressure below 120/80. Hypertension forces your heart to work harder to pump blood. Your arteries may become narrow or stiff. Having hypertension puts you at risk for heart disease, stroke, and other problems.  RISK  FACTORS Some risk factors for high blood pressure are controllable. Others are not.  Risk factors you cannot control include:   Race. You may be at higher risk if you are African American.  Age. Risk increases with age.  Gender. Men are at higher risk than women before age 45 years. After age 65, women are at higher risk than men. Risk factors you can control include:  Not getting enough exercise or physical activity.  Being overweight.  Getting too much fat, sugar, calories, or salt in your diet.  Drinking too much alcohol. SIGNS AND SYMPTOMS Hypertension does not usually cause signs or symptoms. Extremely high blood pressure (hypertensive crisis) may cause headache, anxiety, shortness of breath, and nosebleed. DIAGNOSIS  To check if you have hypertension, your health care provider will measure your blood pressure while you are seated, with your arm held at the level of your heart. It should be measured at least twice using the same arm. Certain conditions can cause a difference in blood pressure between your right and left arms. A blood pressure reading that is higher than normal on one occasion does not mean that you need treatment. If one blood pressure reading is high, ask your health care provider about having it checked again. TREATMENT  Treating high blood pressure includes making lifestyle changes and possibly taking medicine. Living a healthy lifestyle can help lower high blood pressure. You may need to change some of your habits. Lifestyle changes may include:  Following the DASH diet. This diet is high in fruits, vegetables, and whole grains. It is low in salt, red meat, and added sugars.  Getting at least 2 hours of brisk physical activity every week.  Losing weight if necessary.  Not smoking.  Limiting   alcoholic beverages.  Learning ways to reduce stress. If lifestyle changes are not enough to get your blood pressure under control, your health care provider may  prescribe medicine. You may need to take more than one. Work closely with your health care provider to understand the risks and benefits. HOME CARE INSTRUCTIONS  Have your blood pressure rechecked as directed by your health care provider.   Take medicines only as directed by your health care provider. Follow the directions carefully. Blood pressure medicines must be taken as prescribed. The medicine does not work as well when you skip doses. Skipping doses also puts you at risk for problems.   Do not smoke.   Monitor your blood pressure at home as directed by your health care provider. SEEK MEDICAL CARE IF:   You think you are having a reaction to medicines taken.  You have recurrent headaches or feel dizzy.  You have swelling in your ankles.  You have trouble with your vision. SEEK IMMEDIATE MEDICAL CARE IF:  You develop a severe headache or confusion.  You have unusual weakness, numbness, or feel faint.  You have severe chest or abdominal pain.  You vomit repeatedly.  You have trouble breathing. MAKE SURE YOU:   Understand these instructions.  Will watch your condition.  Will get help right away if you are not doing well or get worse. Document Released: 12/03/2005 Document Revised: 04/19/2014 Document Reviewed: 09/25/2013 ExitCare Patient Information 2015 [employer]. This information is not intended to replace advice given to you by your health care provider. Make sure you discuss any questions you have with your health care provider.  

## 2015-04-19 LAB — BMP8+EGFR
BUN/Creatinine Ratio: 17 (ref 11–26)
BUN: 11 mg/dL (ref 8–27)
CO2: 23 mmol/L (ref 18–29)
CREATININE: 0.66 mg/dL (ref 0.57–1.00)
Calcium: 9.3 mg/dL (ref 8.7–10.3)
Chloride: 100 mmol/L (ref 97–108)
GFR calc Af Amer: 109 mL/min/{1.73_m2} (ref >59)
GFR, EST NON AFRICAN AMERICAN: 94 mL/min/{1.73_m2} (ref >59)
GLUCOSE: 117 mg/dL — AB (ref 65–99)
POTASSIUM: 4 mmol/L (ref 3.5–5.2)
SODIUM: 142 mmol/L (ref 134–144)

## 2015-05-02 ENCOUNTER — Encounter (INDEPENDENT_AMBULATORY_CARE_PROVIDER_SITE_OTHER): Payer: Self-pay

## 2015-05-02 ENCOUNTER — Encounter: Payer: Self-pay | Admitting: Family

## 2015-05-02 ENCOUNTER — Ambulatory Visit (INDEPENDENT_AMBULATORY_CARE_PROVIDER_SITE_OTHER): Payer: BLUE CROSS/BLUE SHIELD | Admitting: Family

## 2015-05-02 ENCOUNTER — Ambulatory Visit (INDEPENDENT_AMBULATORY_CARE_PROVIDER_SITE_OTHER): Payer: BLUE CROSS/BLUE SHIELD

## 2015-05-02 VITALS — BP 132/83 | HR 81 | Temp 97.4°F | Ht 64.0 in | Wt 164.2 lb

## 2015-05-02 DIAGNOSIS — Z780 Asymptomatic menopausal state: Secondary | ICD-10-CM

## 2015-05-02 DIAGNOSIS — I10 Essential (primary) hypertension: Secondary | ICD-10-CM | POA: Diagnosis not present

## 2015-05-02 NOTE — Addendum Note (Signed)
Addended by: Earlene Plater on: 05/02/2015 04:25 PM   Modules accepted: Miquel Dunn

## 2015-05-02 NOTE — Patient Instructions (Signed)
Hypertension Hypertension, commonly called high blood pressure, is when the force of blood pumping through your arteries is too strong. Your arteries are the blood vessels that carry blood from your heart throughout your body. A blood pressure reading consists of a higher number over a lower number, such as 110/72. The higher number (systolic) is the pressure inside your arteries when your heart pumps. The lower number (diastolic) is the pressure inside your arteries when your heart relaxes. Ideally you want your blood pressure below 120/80. Hypertension forces your heart to work harder to pump blood. Your arteries may become narrow or stiff. Having hypertension puts you at risk for heart disease, stroke, and other problems.  RISK FACTORS Some risk factors for high blood pressure are controllable. Others are not.  Risk factors you cannot control include:   Race. You may be at higher risk if you are African American.  Age. Risk increases with age.  Gender. Men are at higher risk than women before age 45 years. After age 65, women are at higher risk than men. Risk factors you can control include:  Not getting enough exercise or physical activity.  Being overweight.  Getting too much fat, sugar, calories, or salt in your diet.  Drinking too much alcohol. SIGNS AND SYMPTOMS Hypertension does not usually cause signs or symptoms. Extremely high blood pressure (hypertensive crisis) may cause headache, anxiety, shortness of breath, and nosebleed. DIAGNOSIS  To check if you have hypertension, your health care provider will measure your blood pressure while you are seated, with your arm held at the level of your heart. It should be measured at least twice using the same arm. Certain conditions can cause a difference in blood pressure between your right and left arms. A blood pressure reading that is higher than normal on one occasion does not mean that you need treatment. If one blood pressure reading  is high, ask your health care provider about having it checked again. TREATMENT  Treating high blood pressure includes making lifestyle changes and possibly taking medicine. Living a healthy lifestyle can help lower high blood pressure. You may need to change some of your habits. Lifestyle changes may include:  Following the DASH diet. This diet is high in fruits, vegetables, and whole grains. It is low in salt, red meat, and added sugars.  Getting at least 2 hours of brisk physical activity every week.  Losing weight if necessary.  Not smoking.  Limiting alcoholic beverages.  Learning ways to reduce stress. If lifestyle changes are not enough to get your blood pressure under control, your health care provider may prescribe medicine. You may need to take more than one. Work closely with your health care provider to understand the risks and benefits. HOME CARE INSTRUCTIONS  Have your blood pressure rechecked as directed by your health care provider.   Take medicines only as directed by your health care provider. Follow the directions carefully. Blood pressure medicines must be taken as prescribed. The medicine does not work as well when you skip doses. Skipping doses also puts you at risk for problems.   Do not smoke.   Monitor your blood pressure at home as directed by your health care provider. SEEK MEDICAL CARE IF:   You think you are having a reaction to medicines taken.  You have recurrent headaches or feel dizzy.  You have swelling in your ankles.  You have trouble with your vision. SEEK IMMEDIATE MEDICAL CARE IF:  You develop a severe headache or confusion.    You have unusual weakness, numbness, or feel faint.  You have severe chest or abdominal pain.  You vomit repeatedly.  You have trouble breathing. MAKE SURE YOU:   Understand these instructions.  Will watch your condition.  Will get help right away if you are not doing well or get worse. Document  Released: 12/03/2005 Document Revised: 04/19/2014 Document Reviewed: 09/25/2013 Exit[employer] Patient Information 2015 [employer]. This information is not intended to replace advice given to you by your health care provider. Make sure you discuss any questions you have with your health care provider. DASH Eating Plan DASH stands for "Dietary Approaches to Stop Hypertension." The DASH eating plan is a healthy eating plan that has been shown to reduce high blood pressure (hypertension). Additional health benefits may include reducing the risk of type 2 diabetes mellitus, heart disease, and stroke. The DASH eating plan may also help with weight loss. WHAT DO I NEED TO KNOW ABOUT THE DASH EATING PLAN? For the DASH eating plan, you will follow these general guidelines:  Choose foods with a percent daily value for sodium of less than 5% (as listed on the food label).  Use salt-free seasonings or herbs instead of table salt or sea salt.  Check with your health care provider or pharmacist before using salt substitutes.  Eat lower-sodium products, often labeled as "lower sodium" or "no salt added."  Eat fresh foods.  Eat more vegetables, fruits, and low-fat dairy products.  Choose whole grains. Look for the word "whole" as the first word in the ingredient list.  Choose fish and skinless chicken or turkey more often than red meat. Limit fish, poultry, and meat to 6 oz (170 g) each day.  Limit sweets, desserts, sugars, and sugary drinks.  Choose heart-healthy fats.  Limit cheese to 1 oz (28 g) per day.  Eat more home-cooked food and less restaurant, buffet, and fast food.  Limit fried foods.  Cook foods using methods other than frying.  Limit canned vegetables. If you do use them, rinse them well to decrease the sodium.  When eating at a restaurant, ask that your food be prepared with less salt, or no salt if possible. WHAT FOODS CAN I EAT? Seek help from a dietitian for individual  calorie needs. Grains Whole grain or whole wheat bread. Brown rice. Whole grain or whole wheat pasta. Quinoa, bulgur, and whole grain cereals. Low-sodium cereals. Corn or whole wheat flour tortillas. Whole grain cornbread. Whole grain crackers. Low-sodium crackers. Vegetables Fresh or frozen vegetables (raw, steamed, roasted, or grilled). Low-sodium or reduced-sodium tomato and vegetable juices. Low-sodium or reduced-sodium tomato sauce and paste. Low-sodium or reduced-sodium canned vegetables.  Fruits All fresh, canned (in natural juice), or frozen fruits. Meat and Other Protein Products Ground beef (85% or leaner), grass-fed beef, or beef trimmed of fat. Skinless chicken or turkey. Ground chicken or turkey. Pork trimmed of fat. All fish and seafood. Eggs. Dried beans, peas, or lentils. Unsalted nuts and seeds. Unsalted canned beans. Dairy Low-fat dairy products, such as skim or 1% milk, 2% or reduced-fat cheeses, low-fat ricotta or cottage cheese, or plain low-fat yogurt. Low-sodium or reduced-sodium cheeses. Fats and Oils Tub margarines without trans fats. Light or reduced-fat mayonnaise and salad dressings (reduced sodium). Avocado. Safflower, olive, or canola oils. Natural peanut or almond butter. Other Unsalted popcorn and pretzels. The items listed above may not be a complete list of recommended foods or beverages. Contact your dietitian for more options. WHAT FOODS ARE NOT RECOMMENDED? Grains White bread.   White pasta. White rice. Refined cornbread. Bagels and croissants. Crackers that contain trans fat. Vegetables Creamed or fried vegetables. Vegetables in a cheese sauce. Regular canned vegetables. Regular canned tomato sauce and paste. Regular tomato and vegetable juices. Fruits Dried fruits. Canned fruit in light or heavy syrup. Fruit juice. Meat and Other Protein Products Fatty cuts of meat. Ribs, chicken wings, bacon, sausage, bologna, salami, chitterlings, fatback, hot dogs,  bratwurst, and packaged luncheon meats. Salted nuts and seeds. Canned beans with salt. Dairy Whole or 2% milk, cream, half-and-half, and cream cheese. Whole-fat or sweetened yogurt. Full-fat cheeses or blue cheese. Nondairy creamers and whipped toppings. Processed cheese, cheese spreads, or cheese curds. Condiments Onion and garlic salt, seasoned salt, table salt, and sea salt. Canned and packaged gravies. Worcestershire sauce. Tartar sauce. Barbecue sauce. Teriyaki sauce. Soy sauce, including reduced sodium. Steak sauce. Fish sauce. Oyster sauce. Cocktail sauce. Horseradish. Ketchup and mustard. Meat flavorings and tenderizers. Bouillon cubes. Hot sauce. Tabasco sauce. Marinades. Taco seasonings. Relishes. Fats and Oils Butter, stick margarine, lard, shortening, ghee, and bacon fat. Coconut, palm kernel, or palm oils. Regular salad dressings. Other Pickles and olives. Salted popcorn and pretzels. The items listed above may not be a complete list of foods and beverages to avoid. Contact your dietitian for more information. WHERE CAN I FIND MORE INFORMATION? National Heart, Lung, and Blood Institute: www.nhlbi.nih.gov/health/health-topics/topics/dash/ Document Released: 11/22/2011 Document Revised: 04/19/2014 Document Reviewed: 10/07/2013 Exit[employer] Patient Information 2015 [employer]. This information is not intended to replace advice given to you by your health care provider. Make sure you discuss any questions you have with your health care provider.  

## 2015-05-02 NOTE — Progress Notes (Signed)
   Subjective:    Patient ID: [name], female    DOB: 1951/08/19, 64 y.o.   MRN: [id number]  Pt presents to the office today to recheck HTN. Pt's BP is at goal today.  Hypertension This is a chronic problem. The current episode started more than 1 year ago. The problem has been resolved since onset. The problem is controlled. Pertinent negatives include no anxiety, headaches, palpitations, peripheral edema or shortness of breath. Risk factors for coronary artery disease include dyslipidemia, family history and sedentary lifestyle. Past treatments include calcium channel blockers. The current treatment provides moderate improvement. Hypertensive end-organ damage includes heart failure. There is no history of kidney disease, CAD/MI, CVA or a thyroid problem. There is no history of sleep apnea.      Review of Systems  Constitutional: Negative.   HENT: Negative.   Eyes: Negative.   Respiratory: Negative.  Negative for shortness of breath.   Cardiovascular: Negative.  Negative for palpitations.  Gastrointestinal: Negative.   Endocrine: Negative.   Genitourinary: Negative.   Musculoskeletal: Negative.   Neurological: Negative.  Negative for headaches.  Hematological: Negative.   Psychiatric/Behavioral: Negative.   All other systems reviewed and are negative.      Objective:   Physical Exam  Constitutional: She is oriented to person, place, and time. She appears well-developed and well-nourished. No distress.  HENT:  Head: Normocephalic and atraumatic.  Eyes: Pupils are equal, round, and reactive to light.  Neck: Normal range of motion. Neck supple. No thyromegaly present.  Cardiovascular: Normal rate, regular rhythm, normal heart sounds and intact distal pulses.   No murmur heard. Pulmonary/Chest: Effort normal and breath sounds normal. No respiratory distress. She has no wheezes.  Abdominal: Soft. Bowel sounds are normal. She exhibits no distension. There is no tenderness.    Musculoskeletal: Normal range of motion. She exhibits no edema or tenderness.  Neurological: She is alert and oriented to person, place, and time. She has normal reflexes. No cranial nerve deficit.  Skin: Skin is warm and dry.  Psychiatric: She has a normal mood and affect. Her behavior is normal. Judgment and thought content normal.  Vitals reviewed.    BP 132/83 mmHg  Pulse 81  Temp(Src) 97.4 F (36.3 C) (Oral)  Ht $R'5\' 4"'Rd$  (1.626 m)  Wt 164 lb 3.2 oz (74.481 kg)  BMI 28.17 kg/m2      Assessment & Plan:  1. Essential hypertension -Daily blood pressure log given with instructions on how to fill out and told to bring to next visit -Dash diet information given -Exercise encouraged - Stress Management  -Continue current meds -RTO in 6 months - BMP8+EGFR  2. Post-menopausal - DG Bone Density; Future  Evelina Dun, FNP

## 2015-05-03 LAB — BMP8+EGFR
BUN / CREAT RATIO: 27 — AB (ref 11–26)
BUN: 16 mg/dL (ref 8–27)
CO2: 24 mmol/L (ref 18–29)
CREATININE: 0.6 mg/dL (ref 0.57–1.00)
Calcium: 9.3 mg/dL (ref 8.7–10.3)
Chloride: 101 mmol/L (ref 97–108)
GFR calc Af Amer: 112 mL/min/{1.73_m2} (ref >59)
GFR calc non Af Amer: 97 mL/min/{1.73_m2} (ref >59)
Glucose: 133 mg/dL — ABNORMAL HIGH (ref 65–99)
Potassium: 3.9 mmol/L (ref 3.5–5.2)
Sodium: 143 mmol/L (ref 134–144)

## 2015-05-31 ENCOUNTER — Ambulatory Visit (INDEPENDENT_AMBULATORY_CARE_PROVIDER_SITE_OTHER): Payer: BLUE CROSS/BLUE SHIELD | Admitting: *Deleted

## 2015-05-31 ENCOUNTER — Telehealth: Payer: Self-pay | Admitting: Cardiology

## 2015-05-31 DIAGNOSIS — R001 Bradycardia, unspecified: Secondary | ICD-10-CM | POA: Diagnosis not present

## 2015-05-31 NOTE — Telephone Encounter (Signed)
LMOVM reminding pt to send remote transmission.   

## 2015-06-01 NOTE — Progress Notes (Signed)
Remote pacemaker transmission.   

## 2015-06-03 LAB — CUP PACEART REMOTE DEVICE CHECK
Battery Impedance: 109 Ohm
Battery Remaining Longevity: 159 mo
Battery Voltage: 2.79 V
Brady Statistic AP VP Percent: 0 %
Brady Statistic AP VS Percent: 0 %
Brady Statistic AS VP Percent: 0 %
Brady Statistic AS VS Percent: 100 %
Date Time Interrogation Session: 20160614210013
Lead Channel Impedance Value: 455 Ohm
Lead Channel Impedance Value: 536 Ohm
Lead Channel Pacing Threshold Amplitude: 0.375 V
Lead Channel Pacing Threshold Amplitude: 0.875 V
Lead Channel Pacing Threshold Pulse Width: 0.4 ms
Lead Channel Pacing Threshold Pulse Width: 0.4 ms
Lead Channel Sensing Intrinsic Amplitude: 1.4 mV
Lead Channel Sensing Intrinsic Amplitude: 8 mV
Lead Channel Setting Pacing Amplitude: 2 V
Lead Channel Setting Pacing Amplitude: 2.5 V
Lead Channel Setting Pacing Pulse Width: 0.4 ms
Lead Channel Setting Sensing Sensitivity: 5.6 mV

## 2015-06-08 ENCOUNTER — Encounter: Payer: Self-pay | Admitting: Cardiology

## 2015-06-13 ENCOUNTER — Encounter: Payer: Self-pay | Admitting: Internal Medicine

## 2015-06-22 ENCOUNTER — Encounter: Payer: Self-pay | Admitting: Cardiology

## 2015-07-15 ENCOUNTER — Ambulatory Visit: Payer: BLUE CROSS/BLUE SHIELD | Admitting: Family

## 2015-07-18 ENCOUNTER — Encounter: Payer: Self-pay | Admitting: *Deleted

## 2015-08-25 ENCOUNTER — Ambulatory Visit (INDEPENDENT_AMBULATORY_CARE_PROVIDER_SITE_OTHER): Payer: BLUE CROSS/BLUE SHIELD | Admitting: Family

## 2015-08-25 ENCOUNTER — Encounter: Payer: Self-pay | Admitting: Family

## 2015-08-25 VITALS — BP 132/83 | HR 101 | Temp 98.7°F | Ht 64.0 in | Wt 165.0 lb

## 2015-08-25 DIAGNOSIS — R300 Dysuria: Secondary | ICD-10-CM

## 2015-08-25 DIAGNOSIS — N3000 Acute cystitis without hematuria: Secondary | ICD-10-CM | POA: Diagnosis not present

## 2015-08-25 LAB — POCT URINALYSIS DIPSTICK
Bilirubin, UA: NEGATIVE
GLUCOSE UA: NEGATIVE
Ketones, UA: NEGATIVE
NITRITE UA: NEGATIVE
Spec Grav, UA: >=1.03
UROBILINOGEN UA: NEGATIVE
pH, UA: 6

## 2015-08-25 LAB — POCT UA - MICROSCOPIC ONLY
Casts, Ur, LPF, POC: NEGATIVE
Crystals, Ur, HPF, POC: NEGATIVE
MUCUS UA: NEGATIVE
Yeast, UA: NEGATIVE

## 2015-08-25 MED ORDER — SULFAMETHOXAZOLE-TRIMETHOPRIM 800-160 MG PO TABS: 1.0000 | ORAL_TABLET | Freq: Two times a day (BID) | ORAL | Status: DC

## 2015-08-25 NOTE — Progress Notes (Signed)
   Subjective:    Patient ID: TAMESHIA BONNEVILLE, female    DOB: [birth year], 64 y.o.   MRN: [id number]  Dysuria  This is a new problem. The current episode started in the past 7 days. The problem occurs every urination. The problem has been waxing and waning. The quality of the pain is described as aching. The pain is at a severity of 7/10. The pain is moderate. Associated symptoms include flank pain, frequency, hesitancy and urgency. Pertinent negatives include no chills, discharge, hematuria, nausea or vomiting. She has tried increased fluids for the symptoms. The treatment provided mild relief.      Review of Systems  Constitutional: Negative.  Negative for chills.  HENT: Negative.   Eyes: Negative.   Respiratory: Negative.  Negative for shortness of breath.   Cardiovascular: Negative.  Negative for palpitations.  Gastrointestinal: Negative.  Negative for nausea and vomiting.  Endocrine: Negative.   Genitourinary: Positive for dysuria, hesitancy, urgency, frequency and flank pain. Negative for hematuria.  Musculoskeletal: Negative.   Neurological: Negative.  Negative for headaches.  Hematological: Negative.   Psychiatric/Behavioral: Negative.   All other systems reviewed and are negative.      Objective:   Physical Exam  Constitutional: She is oriented to person, place, and time. She appears well-developed and well-nourished. No distress.  HENT:  Head: Normocephalic and atraumatic.  Right Ear: External ear normal.  Left Ear: External ear normal.  Nose: Nose normal.  Mouth/Throat: Oropharynx is clear and moist.  Eyes: Pupils are equal, round, and reactive to light.  Neck: Normal range of motion. Neck supple. No thyromegaly present.  Cardiovascular: Normal rate, regular rhythm, normal heart sounds and intact distal pulses.   No murmur heard. Pulmonary/Chest: Effort normal and breath sounds normal. No respiratory distress. She has no wheezes.  Abdominal: Soft. Bowel sounds are  normal. She exhibits no distension. There is no tenderness.  Musculoskeletal: Normal range of motion. She exhibits no edema or tenderness.  Negative for CVA tenderness   Neurological: She is alert and oriented to person, place, and time. She has normal reflexes. No cranial nerve deficit.  Skin: Skin is warm and dry.  Psychiatric: She has a normal mood and affect. Her behavior is normal. Judgment and thought content normal.  Vitals reviewed.   BP 132/83 mmHg  Pulse 101  Temp(Src) 98.7 F (37.1 C) (Oral)  Ht 5\' 4"  (1.626 m)  Wt 165 lb (74.844 kg)  BMI 28.31 kg/m2       Assessment & Plan:  1. Dysuria - POCT urinalysis dipstick - POCT UA - Microscopic Only - Urine culture  2. Acute cystitis without hematuria -Force fluids AZO over the counter X2 days RTO prn Culture pending - sulfamethoxazole-trimethoprim (BACTRIM DS,SEPTRA DS) 800-160 MG per tablet; Take 1 tablet by mouth 2 (two) times daily.  Dispense: 10 tablet; Refill: 0  Evelina Dun, FNP

## 2015-08-25 NOTE — Patient Instructions (Signed)

## 2015-08-27 LAB — URINE CULTURE

## 2015-09-06 ENCOUNTER — Telehealth: Payer: Self-pay

## 2015-09-06 ENCOUNTER — Ambulatory Visit (INDEPENDENT_AMBULATORY_CARE_PROVIDER_SITE_OTHER): Payer: BLUE CROSS/BLUE SHIELD | Admitting: Internal Medicine

## 2015-09-06 ENCOUNTER — Encounter: Payer: Self-pay | Admitting: Internal Medicine

## 2015-09-06 VITALS — BP 148/84 | HR 76 | Ht 64.0 in | Wt 166.4 lb

## 2015-09-06 DIAGNOSIS — Z950 Presence of cardiac pacemaker: Secondary | ICD-10-CM | POA: Diagnosis not present

## 2015-09-06 LAB — CUP PACEART INCLINIC DEVICE CHECK
Battery Remaining Longevity: 159 mo
Brady Statistic AS VP Percent: 0 %
Brady Statistic AS VS Percent: 100 %
Date Time Interrogation Session: 20160920122642
Lead Channel Pacing Threshold Amplitude: 0.75 V
Lead Channel Pacing Threshold Pulse Width: 0.4 ms
Lead Channel Pacing Threshold Pulse Width: 0.4 ms
Lead Channel Sensing Intrinsic Amplitude: 11.2 mV
Lead Channel Setting Pacing Amplitude: 2.5 V
Lead Channel Setting Pacing Pulse Width: 0.4 ms
MDC IDC MSMT BATTERY IMPEDANCE: 109 Ohm
MDC IDC MSMT BATTERY VOLTAGE: 2.79 V
MDC IDC MSMT LEADCHNL RA IMPEDANCE VALUE: 449 Ohm
MDC IDC MSMT LEADCHNL RA PACING THRESHOLD AMPLITUDE: 0.5 V
MDC IDC MSMT LEADCHNL RA SENSING INTR AMPL: 2.8 mV
MDC IDC MSMT LEADCHNL RV IMPEDANCE VALUE: 548 Ohm
MDC IDC SET LEADCHNL RA PACING AMPLITUDE: 2 V
MDC IDC SET LEADCHNL RV SENSING SENSITIVITY: 4 mV
MDC IDC STAT BRADY AP VP PERCENT: 0 %
MDC IDC STAT BRADY AP VS PERCENT: 0 %

## 2015-09-06 MED ORDER — NEBIVOLOL HCL 5 MG PO TABS: 5.0000 mg | ORAL_TABLET | Freq: Two times a day (BID) | ORAL | Status: DC

## 2015-09-06 NOTE — Progress Notes (Signed)
HPI [name] returns today for followup. She is a pleasant 64 yo woman with a h/o symptomatic bradycardia, s/p PPM. She also has a h/o dyslipidemia. In the interim she has undergone PM generator change out. She denies any trouble healing.  No chest pain or sob. No peripheral edema. She is retired and doing well.  No Known Allergies   Current Outpatient Prescriptions  Medication Sig Dispense Refill  . amLODipine (NORVASC) 5 MG tablet Take 1 tablet (5 mg total) by mouth daily. 90 tablet 3  . calcium citrate-vitamin D (CITRACAL+D) 315-200 MG-UNIT per tablet Take 1 tablet by mouth daily.     Marland Kitchen ezetimibe (ZETIA) 10 MG tablet Take 1 tablet (10 mg total) by mouth daily. 90 tablet 3  . omega-3 acid ethyl esters (LOVAZA) 1 G capsule Take 2 capsules (2 g total) by mouth daily. 180 capsule 4  . pantoprazole (PROTONIX) 40 MG tablet Take 1 tablet (40 mg total) by mouth daily. 90 tablet 4  . sertraline (ZOLOFT) 50 MG tablet Take 0.5 tablets (25 mg total) by mouth daily. 90 tablet 4  . Vitamin D, Ergocalciferol, (DRISDOL) 50000 UNITS CAPS capsule Take 1 capsule by mouth once a week.  2  . [DISCONTINUED] metoprolol succinate (TOPROL-XL) 25 MG 24 hr tablet Take 1 tablet (25 mg total) by mouth daily. 90 tablet 3   No current facility-administered medications for this visit.     Past Medical History  Diagnosis Date  . Symptomatic bradycardia   . GERD (gastroesophageal reflux disease)     Hiatal Hernia  . Arthritis     of the neck  . History of uterine cancer   . Hyperlipidemia   . Hypertension   . Osteoporosis   . Anxiety   . Colon polyps   . Esophageal stricture   . Pacemaker     ROS:   All systems reviewed and negative except as noted in the HPI.   Past Surgical History  Procedure Laterality Date  . Laparoscopic total hysterectomy    . Lymph node dissection      bilateral pelvic and right-sided periaortic  . Status post pacemaker      metronic [phone]  . Transthoracic  echocardiogram  08/30/04  . Cholecystectomy    . Appendectomy    . Tubal ligation    . Pacemaker generator change N/A 05/21/2014    Procedure: PACEMAKER GENERATOR CHANGE;  Surgeon: Evans Lance, MD;  Location: [facility] CATH LAB;  Service: Cardiovascular;  Laterality: N/A;     Family History  Problem Relation Age of Onset  . Heart disease Mother   . Heart attack Mother   . Heart disease Father   . Heart attack Father   . Cancer Sister     breast  . Alcohol abuse Brother      Social History   Social History  . Marital Status: Single    Spouse Name: N/A  . Number of Children: N/A  . Years of Education: N/A   Occupational History  . employed     Engineer, manufacturing systems   Social History Main Topics  . Smoking status: Never Smoker   . Smokeless tobacco: Never Used  . Alcohol Use: No  . Drug Use: No  . Sexual Activity: Not on file   Other Topics Concern  . Not on file   Social History Narrative     BP 148/84 mmHg  Pulse 76  Ht 5\' 4"  (1.626 m)  Wt 166 lb 6.4 oz (75.479 kg)  BMI 28.55 kg/m2  Physical Exam:  Well appearing middle-aged woman, NAD HEENT: Unremarkable Neck:  No JVD, no thyromegally Lungs:  Clear with no wheezes, rales, or rhonchi. Well-healed pacemaker incision HEART:  Regular rate rhythm, no murmurs, no rubs, no clicks Abd:  soft, positive bowel sounds, no organomegally, no rebound, no guarding Ext:  2 plus pulses, no edema, no cyanosis, no clubbing Skin:  No rashes no nodules Neuro:  CN II through XII intact, motor grossly intact  DEVICE    See PaceArt for details. Normal DDD PM function.  Assess/Plan:

## 2015-09-06 NOTE — Telephone Encounter (Signed)
Prior auth for Bystolic 5 mg bid obtained from [employer].

## 2015-09-06 NOTE — Patient Instructions (Signed)
  Medication Instructions:  Your physician has recommended you make the following change in your medication:  1) Start Bystolic 5 mg twice daily    Labwork: None ordered  Testing/Procedures: None ordered  Follow-Up: Your physician wants you to follow-up in:  12 months with Dillon Bjork, PA You will receive a reminder letter in the mail two months in advance. If you don't receive a letter, please call our office to schedule the follow-up appointment.  Remote monitoring is used to monitor your Pacemaker  from home. This monitoring reduces the number of office visits required to check your device to one time per year. It allows [place] to keep an eye on the functioning of your device to ensure it is working properly. You are scheduled for a device check from home on 12/06/15. You may send your transmission at any time that day. If you have a wireless device, the transmission will be sent automatically. After your physician reviews your transmission, you will receive a postcard with your next transmission date.    Any Other Special Instructions Will Be Listed Below (If Applicable).

## 2015-09-06 NOTE — Assessment & Plan Note (Signed)
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 Will recheck in several months. 

## 2015-09-06 NOTE — Assessment & Plan Note (Signed)
Her blood pressure remains elevated. She has been intolerant of metoprolol, toprol, coreg and norvasc does not work. She would like to switch back to [employer]. We will prescribe.

## 2015-09-09 NOTE — Telephone Encounter (Signed)
Opened in error

## 2015-11-16 ENCOUNTER — Ambulatory Visit: Payer: BLUE CROSS/BLUE SHIELD | Admitting: Family

## 2015-11-17 ENCOUNTER — Ambulatory Visit (INDEPENDENT_AMBULATORY_CARE_PROVIDER_SITE_OTHER): Payer: BLUE CROSS/BLUE SHIELD | Admitting: Family

## 2015-11-17 ENCOUNTER — Encounter: Payer: Self-pay | Admitting: Family

## 2015-11-17 VITALS — BP 127/81 | HR 75 | Temp 97.1°F | Ht 64.0 in | Wt 167.0 lb

## 2015-11-17 DIAGNOSIS — K219 Gastro-esophageal reflux disease without esophagitis: Secondary | ICD-10-CM

## 2015-11-17 DIAGNOSIS — Z1159 Encounter for screening for other viral diseases: Secondary | ICD-10-CM | POA: Diagnosis not present

## 2015-11-17 DIAGNOSIS — E785 Hyperlipidemia, unspecified: Secondary | ICD-10-CM

## 2015-11-17 DIAGNOSIS — F419 Anxiety disorder, unspecified: Secondary | ICD-10-CM

## 2015-11-17 DIAGNOSIS — M1990 Unspecified osteoarthritis, unspecified site: Secondary | ICD-10-CM | POA: Diagnosis not present

## 2015-11-17 DIAGNOSIS — E559 Vitamin D deficiency, unspecified: Secondary | ICD-10-CM

## 2015-11-17 DIAGNOSIS — M810 Age-related osteoporosis without current pathological fracture: Secondary | ICD-10-CM

## 2015-11-17 DIAGNOSIS — I10 Essential (primary) hypertension: Secondary | ICD-10-CM

## 2015-11-17 MED ORDER — EZETIMIBE 10 MG PO TABS: 10.0000 mg | ORAL_TABLET | Freq: Every day | ORAL | Status: DC

## 2015-11-17 MED ORDER — PANTOPRAZOLE SODIUM 40 MG PO TBEC: 40.0000 mg | DELAYED_RELEASE_TABLET | Freq: Every day | ORAL | Status: DC

## 2015-11-17 MED ORDER — SERTRALINE HCL 50 MG PO TABS: 25.0000 mg | ORAL_TABLET | Freq: Every day | ORAL | Status: DC

## 2015-11-17 NOTE — Patient Instructions (Signed)
Health Maintenance, Female Adopting a healthy lifestyle and getting preventive care can go a long way to promote health and wellness. Talk with your health care provider about what schedule of regular examinations is right for you. This is a good chance for you to check in with your provider about disease prevention and staying healthy. In between checkups, there are plenty of things you can do on your own. Experts have done a lot of research about which lifestyle changes and preventive measures are most likely to keep you healthy. Ask your health care provider for more information. WEIGHT AND DIET  Eat a healthy diet  Be sure to include plenty of vegetables, fruits, low-fat dairy products, and lean protein.  Do not eat a lot of foods high in solid fats, added sugars, or salt.  Get regular exercise. This is one of the most important things you can do for your health.  Most adults should exercise for at least 150 minutes each week. The exercise should increase your heart rate and make you sweat (moderate-intensity exercise).  Most adults should also do strengthening exercises at least twice a week. This is in addition to the moderate-intensity exercise.  Maintain a healthy weight  Body mass index (BMI) is a measurement that can be used to identify possible weight problems. It estimates body fat based on height and weight. Your health care provider can help determine your BMI and help you achieve or maintain a healthy weight.  For females 20 years of age and older:   A BMI below 18.5 is considered underweight.  A BMI of 18.5 to 24.9 is normal.  A BMI of 25 to 29.9 is considered overweight.  A BMI of 30 and above is considered obese.  Watch levels of cholesterol and blood lipids  You should start having your blood tested for lipids and cholesterol at 64 years of age, then have this test every 5 years.  You may need to have your cholesterol levels checked more often if:  Your lipid  or cholesterol levels are high.  You are older than 64 years of age.  You are at high risk for heart disease.  CANCER SCREENING   Lung Cancer  Lung cancer screening is recommended for adults 55-80 years old who are at high risk for lung cancer because of a history of smoking.  A yearly low-dose CT scan of the lungs is recommended for people who:  Currently smoke.  Have quit within the past 15 years.  Have at least a 30-pack-year history of smoking. A pack year is smoking an average of one pack of cigarettes a day for 1 year.  Yearly screening should continue until it has been 15 years since you quit.  Yearly screening should stop if you develop a health problem that would prevent you from having lung cancer treatment.  Breast Cancer  Practice breast self-awareness. This means understanding how your breasts normally appear and feel.  It also means doing regular breast self-exams. Let your health care provider know about any changes, no matter how small.  If you are in your 20s or 30s, you should have a clinical breast exam (CBE) by a health care provider every 1-3 years as part of a regular health exam.  If you are 40 or older, have a CBE every year. Also consider having a breast X-ray (mammogram) every year.  If you have a family history of breast cancer, talk to your health care provider about genetic screening.  If you   are at high risk for breast cancer, talk to your health care provider about having an MRI and a mammogram every year.  Breast cancer gene (BRCA) assessment is recommended for women who have family members with BRCA-related cancers. BRCA-related cancers include:  Breast.  Ovarian.  Tubal.  Peritoneal cancers.  Results of the assessment will determine the need for genetic counseling and BRCA1 and BRCA2 testing. Cervical Cancer Your health care provider may recommend that you be screened regularly for cancer of the pelvic organs (ovaries, uterus, and  vagina). This screening involves a pelvic examination, including checking for microscopic changes to the surface of your cervix (Pap test). You may be encouraged to have this screening done every 3 years, beginning at age 21.  For women ages 30-65, health care providers may recommend pelvic exams and Pap testing every 3 years, or they may recommend the Pap and pelvic exam, combined with testing for human papilloma virus (HPV), every 5 years. Some types of HPV increase your risk of cervical cancer. Testing for HPV may also be done on women of any age with unclear Pap test results.  Other health care providers may not recommend any screening for nonpregnant women who are considered low risk for pelvic cancer and who do not have symptoms. Ask your health care provider if a screening pelvic exam is right for you.  If you have had past treatment for cervical cancer or a condition that could lead to cancer, you need Pap tests and screening for cancer for at least 20 years after your treatment. If Pap tests have been discontinued, your risk factors (such as having a new sexual partner) need to be reassessed to determine if screening should resume. Some women have medical problems that increase the chance of getting cervical cancer. In these cases, your health care provider may recommend more frequent screening and Pap tests. Colorectal Cancer  This type of cancer can be detected and often prevented.  Routine colorectal cancer screening usually begins at 64 years of age and continues through 64 years of age.  Your health care provider may recommend screening at an earlier age if you have risk factors for colon cancer.  Your health care provider may also recommend using home test kits to check for hidden blood in the stool.  A small camera at the end of a tube can be used to examine your colon directly (sigmoidoscopy or colonoscopy). This is done to check for the earliest forms of colorectal  cancer.  Routine screening usually begins at age 50.  Direct examination of the colon should be repeated every 5-10 years through 64 years of age. However, you may need to be screened more often if early forms of precancerous polyps or small growths are found. Skin Cancer  Check your skin from head to toe regularly.  Tell your health care provider about any new moles or changes in moles, especially if there is a change in a mole's shape or color.  Also tell your health care provider if you have a mole that is larger than the size of a pencil eraser.  Always use sunscreen. Apply sunscreen liberally and repeatedly throughout the day.  Protect yourself by wearing long sleeves, pants, a wide-brimmed hat, and sunglasses whenever you are outside. HEART DISEASE, DIABETES, AND HIGH BLOOD PRESSURE   High blood pressure causes heart disease and increases the risk of stroke. High blood pressure is more likely to develop in:  People who have blood pressure in the high end   of the normal range (130-139/85-89 mm Hg).  People who are overweight or obese.  People who are African American.  If you are 38-23 years of age, have your blood pressure checked every 3-5 years. If you are 61 years of age or older, have your blood pressure checked every year. You should have your blood pressure measured twice--once when you are at a hospital or clinic, and once when you are not at a hospital or clinic. Record the average of the two measurements. To check your blood pressure when you are not at a hospital or clinic, you can use:  An automated blood pressure machine at a pharmacy.  A home blood pressure monitor.  If you are between 45 years and 39 years old, ask your health care provider if you should take aspirin to prevent strokes.  Have regular diabetes screenings. This involves taking a blood sample to check your fasting blood sugar level.  If you are at a normal weight and have a low risk for diabetes,  have this test once every three years after 64 years of age.  If you are overweight and have a high risk for diabetes, consider being tested at a younger age or more often. PREVENTING INFECTION  Hepatitis B  If you have a higher risk for hepatitis B, you should be screened for this virus. You are considered at high risk for hepatitis B if:  You were born in a country where hepatitis B is common. Ask your health care provider which countries are considered high risk.  Your parents were born in a high-risk country, and you have not been immunized against hepatitis B (hepatitis B vaccine).  You have HIV or AIDS.  You use needles to inject street drugs.  You live with someone who has hepatitis B.  You have had sex with someone who has hepatitis B.  You get hemodialysis treatment.  You take certain medicines for conditions, including cancer, organ transplantation, and autoimmune conditions. Hepatitis C  Blood testing is recommended for:  Everyone born from [birth year] through 1965.  Anyone with known risk factors for hepatitis C. Sexually transmitted infections (STIs)  You should be screened for sexually transmitted infections (STIs) including gonorrhea and chlamydia if:  You are sexually active and are younger than 64 years of age.  You are older than 64 years of age and your health care provider tells you that you are at risk for this type of infection.  Your sexual activity has changed since you were last screened and you are at an increased risk for chlamydia or gonorrhea. Ask your health care provider if you are at risk.  If you do not have HIV, but are at risk, it may be recommended that you take a prescription medicine daily to prevent HIV infection. This is called pre-exposure prophylaxis (PrEP). You are considered at risk if:  You are sexually active and do not regularly use condoms or know the HIV status of your partner(s).  You take drugs by injection.  You are sexually  active with a partner who has HIV. Talk with your health care provider about whether you are at high risk of being infected with HIV. If you choose to begin PrEP, you should first be tested for HIV. You should then be tested every 3 months for as long as you are taking PrEP.  PREGNANCY   If you are premenopausal and you may become pregnant, ask your health care provider about preconception counseling.  If you may  become pregnant, take 400 to 800 micrograms (mcg) of folic acid every day.  If you want to prevent pregnancy, talk to your health care provider about birth control (contraception). OSTEOPOROSIS AND MENOPAUSE   Osteoporosis is a disease in which the bones lose minerals and strength with aging. This can result in serious bone fractures. Your risk for osteoporosis can be identified using a bone density scan.  If you are 61 years of age or older, or if you are at risk for osteoporosis and fractures, ask your health care provider if you should be screened.  Ask your health care provider whether you should take a calcium or vitamin D supplement to lower your risk for osteoporosis.  Menopause may have certain physical symptoms and risks.  Hormone replacement therapy may reduce some of these symptoms and risks. Talk to your health care provider about whether hormone replacement therapy is right for you.  HOME CARE INSTRUCTIONS   Schedule regular health, dental, and eye exams.  Stay current with your immunizations.   Do not use any tobacco products including cigarettes, chewing tobacco, or electronic cigarettes.  If you are pregnant, do not drink alcohol.  If you are breastfeeding, limit how much and how often you drink alcohol.  Limit alcohol intake to no more than 1 drink per day for nonpregnant women. One drink equals 12 ounces of beer, 5 ounces of wine, or 1 ounces of hard liquor.  Do not use street drugs.  Do not share needles.  Ask your health care provider for help if  you need support or information about quitting drugs.  Tell your health care provider if you often feel depressed.  Tell your health care provider if you have ever been abused or do not feel safe at home.   This information is not intended to replace advice given to you by your health care provider. Make sure you discuss any questions you have with your health care provider.   Document Released: 06/18/2011 Document Revised: 12/24/2014 Document Reviewed: 11/04/2013 Elsevier Interactive Patient Education [employer].

## 2015-11-17 NOTE — Progress Notes (Signed)
Subjective:    Patient ID: [name], female    DOB: 1951/04/17, 64 y.o.   MRN: [id number]  Pt presents to the office today for chronic follow up.  Hypertension This is a chronic problem. The current episode started more than 1 year ago. The problem has been resolved since onset. The problem is controlled. Associated symptoms include anxiety. Pertinent negatives include no headaches, palpitations, peripheral edema or shortness of breath. Risk factors for coronary artery disease include dyslipidemia, post-menopausal state and family history. Past treatments include beta blockers. The current treatment provides significant improvement. 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 There is no history of kidney disease, CVA, heart failure or a thyroid problem. There is no history of sleep apnea.  Anxiety Presents for follow-up visit. Onset was more than 5 years ago. The problem has been waxing and waning. Symptoms include depressed mood and suicidal ideas. Patient reports no dizziness, excessive worry, hyperventilation, nervous/anxious behavior, palpitations, panic or shortness of breath. Symptoms occur rarely.   Her past medical history is significant for depression. Past treatments include SSRIs. Compliance with prior treatments has been good.  Hyperlipidemia This is a chronic problem. The current episode started more than 1 year ago. The problem is controlled. Recent lipid tests were reviewed and are high. She has no history of diabetes or hypothyroidism. Pertinent negatives include no leg pain, myalgias or shortness of breath. Current antihyperlipidemic treatment includes ezetimibe. The current treatment provides significant improvement of lipids. Risk factors for coronary artery disease include dyslipidemia, family history, hypertension, obesity and a sedentary lifestyle.  Gastroesophageal Reflux She reports no coughing, no heartburn, no sore throat, no water brash or no  wheezing. This is a chronic problem. The current episode started more than 1 year ago. The problem occurs occasionally. The problem has been resolved. The symptoms are aggravated by certain foods. She has tried a PPI for the symptoms. The treatment provided moderate relief.      Review of Systems  Constitutional: Negative.   HENT: Negative.  Negative for sore throat.   Eyes: Negative.   Respiratory: Negative.  Negative for cough, shortness of breath and wheezing.   Cardiovascular: Negative.  Negative for palpitations.  Gastrointestinal: Negative.  Negative for heartburn.  Endocrine: Negative.   Genitourinary: Negative.   Musculoskeletal: Negative.  Negative for myalgias.  Neurological: Negative.  Negative for dizziness and headaches.  Hematological: Negative.   Psychiatric/Behavioral: Positive for suicidal ideas. The patient is not nervous/anxious.   All other systems reviewed and are negative.      Objective:   Physical Exam  Constitutional: She is oriented to person, place, and time. She appears well-developed and well-nourished. No distress.  HENT:  Head: Normocephalic and atraumatic.  Right Ear: External ear normal.  Left Ear: External ear normal.  Nose: Nose normal.  Mouth/Throat: Oropharynx is clear and moist.  Eyes: Pupils are equal, round, and reactive to light.  Neck: Normal range of motion. Neck supple. No thyromegaly present.  Cardiovascular: Normal rate, regular rhythm, normal heart sounds and intact distal pulses.   No murmur heard. Pulmonary/Chest: Effort normal and breath sounds normal. No respiratory distress. She has no wheezes.  Abdominal: Soft. Bowel sounds are normal. She exhibits no distension. There is no tenderness.  Musculoskeletal: Normal range of motion. She exhibits no edema or tenderness.  Neurological: She is alert and oriented to person, place, and time. She has normal reflexes. No cranial nerve deficit.  Skin: Skin is warm and dry.  Psychiatric:  She has a normal mood and affect. Her behavior is normal. Judgment and thought content normal.  Vitals reviewed.     BP 127/81 mmHg  Pulse 75  Temp(Src) 97.1 F (36.2 C) (Oral)  Ht _0  (1.626 m)  Wt 167 lb (75.751 kg)  BMI 28.65 kg/m2     Assessment & Plan:  1. Essential hypertension - CMP14+EGFR  2. Gastroesophageal reflux disease, esophagitis presence not specified - CMP14+EGFR  3. Arthritis - CMP14+EGFR  4. Osteoporosis - CMP14+EGFR  5. Anxiety - CMP14+EGFR  6. Hyperlipidemia - CMP14+EGFR - Lipid panel  7. Vitamin D deficiency - CMP14+EGFR - VITAMIN D 25 Hydroxy (Vit-D Deficiency, Fractures)  8. Need for hepatitis C screening test - CMP14+EGFR - Hepatitis C antibody   Continue all meds Labs pending Health Maintenance reviewed- Mammogram scheduled for 12/20/15 Diet and exercise encouraged RTO 6 months  Evelina Dun, FNP

## 2015-11-18 LAB — LIPID PANEL
CHOL/HDL RATIO: 4.4 ratio (ref 0.0–4.4)
Cholesterol, Total: 205 mg/dL — ABNORMAL HIGH (ref 100–199)
HDL: 47 mg/dL (ref >39)
LDL CALC: 103 mg/dL — AB (ref 0–99)
Triglycerides: 276 mg/dL — ABNORMAL HIGH (ref 0–149)
VLDL CHOLESTEROL CAL: 55 mg/dL — AB (ref 5–40)

## 2015-11-18 LAB — CMP14+EGFR
A/G RATIO: 1.5 (ref 1.1–2.5)
ALT: 21 IU/L (ref 0–32)
AST: 19 IU/L (ref 0–40)
Albumin: 4.1 g/dL (ref 3.6–4.8)
Alkaline Phosphatase: 77 IU/L (ref 39–117)
BUN/Creatinine Ratio: 22 (ref 11–26)
BUN: 14 mg/dL (ref 8–27)
Bilirubin Total: 0.3 mg/dL (ref 0.0–1.2)
CALCIUM: 9.2 mg/dL (ref 8.7–10.3)
CO2: 25 mmol/L (ref 18–29)
Chloride: 100 mmol/L (ref 97–106)
Creatinine, Ser: 0.65 mg/dL (ref 0.57–1.00)
GFR, EST AFRICAN AMERICAN: 109 mL/min/{1.73_m2} (ref >59)
GFR, EST NON AFRICAN AMERICAN: 94 mL/min/{1.73_m2} (ref >59)
Globulin, Total: 2.7 g/dL (ref 1.5–4.5)
Glucose: 157 mg/dL — ABNORMAL HIGH (ref 65–99)
POTASSIUM: 4.2 mmol/L (ref 3.5–5.2)
Sodium: 141 mmol/L (ref 136–144)
TOTAL PROTEIN: 6.8 g/dL (ref 6.0–8.5)

## 2015-11-18 LAB — VITAMIN D 25 HYDROXY (VIT D DEFICIENCY, FRACTURES): Vit D, 25-Hydroxy: 39.8 ng/mL (ref 30.0–100.0)

## 2015-11-18 LAB — HEPATITIS C ANTIBODY

## 2015-12-06 ENCOUNTER — Telehealth: Payer: Self-pay | Admitting: Cardiology

## 2015-12-06 ENCOUNTER — Ambulatory Visit (INDEPENDENT_AMBULATORY_CARE_PROVIDER_SITE_OTHER): Payer: BLUE CROSS/BLUE SHIELD | Admitting: *Deleted

## 2015-12-06 DIAGNOSIS — Z950 Presence of cardiac pacemaker: Secondary | ICD-10-CM | POA: Diagnosis not present

## 2015-12-06 DIAGNOSIS — R001 Bradycardia, unspecified: Secondary | ICD-10-CM

## 2015-12-06 NOTE — Telephone Encounter (Signed)
LMOVM reminding pt to send remote transmission.   

## 2015-12-08 ENCOUNTER — Encounter: Payer: Self-pay | Admitting: Cardiology

## 2015-12-09 ENCOUNTER — Encounter: Payer: Self-pay | Admitting: Cardiology

## 2015-12-09 LAB — CUP PACEART REMOTE DEVICE CHECK
Battery Remaining Longevity: 161 mo
Brady Statistic AS VS Percent: 100 %
Date Time Interrogation Session: 20161222005804
Implantable Lead Implant Date: 20050914
Implantable Lead Model: 5076
Lead Channel Pacing Threshold Amplitude: 0.875 V
Lead Channel Pacing Threshold Pulse Width: 0.4 ms
Lead Channel Setting Pacing Amplitude: 2 V
Lead Channel Setting Pacing Amplitude: 2.5 V
Lead Channel Setting Pacing Pulse Width: 0.4 ms
Lead Channel Setting Sensing Sensitivity: 4 mV
MDC IDC LEAD IMPLANT DT: 20050914
MDC IDC LEAD LOCATION: 753859
MDC IDC LEAD LOCATION: 753860
MDC IDC MSMT BATTERY IMPEDANCE: 109 Ohm
MDC IDC MSMT BATTERY VOLTAGE: 2.79 V
MDC IDC MSMT LEADCHNL RA IMPEDANCE VALUE: 488 Ohm
MDC IDC MSMT LEADCHNL RA PACING THRESHOLD AMPLITUDE: 0.5 V
MDC IDC MSMT LEADCHNL RA PACING THRESHOLD PULSEWIDTH: 0.4 ms
MDC IDC MSMT LEADCHNL RA SENSING INTR AMPL: 1.4 mV
MDC IDC MSMT LEADCHNL RV IMPEDANCE VALUE: 571 Ohm
MDC IDC MSMT LEADCHNL RV SENSING INTR AMPL: 8 mV
MDC IDC STAT BRADY AP VP PERCENT: 0 %
MDC IDC STAT BRADY AP VS PERCENT: 0 %
MDC IDC STAT BRADY AS VP PERCENT: 0 %

## 2015-12-09 NOTE — Progress Notes (Signed)
Remote pacemaker transmission.   

## 2015-12-23 ENCOUNTER — Encounter: Payer: Self-pay | Admitting: Cardiology

## 2015-12-29 ENCOUNTER — Telehealth: Payer: Self-pay | Admitting: Internal Medicine

## 2015-12-29 ENCOUNTER — Ambulatory Visit (INDEPENDENT_AMBULATORY_CARE_PROVIDER_SITE_OTHER): Payer: BLUE CROSS/BLUE SHIELD | Admitting: Pediatrics

## 2015-12-29 VITALS — BP 128/78 | HR 80 | Temp 97.4°F | Ht 64.0 in | Wt 168.0 lb

## 2015-12-29 DIAGNOSIS — R55 Syncope and collapse: Secondary | ICD-10-CM

## 2015-12-29 LAB — POCT GLYCOSYLATED HEMOGLOBIN (HGB A1C): Hemoglobin A1C: 6.9

## 2015-12-29 NOTE — Telephone Encounter (Signed)
Follow Up  Pt called states that 3 fingers on her left hand went numb for about 5 minutes. She is concerned and req a call back to discuss if she should see PCP or heartcare. Please calll

## 2015-12-29 NOTE — Progress Notes (Signed)
Subjective:    Patient ID: [name], female    DOB: 1951-07-10, 65 y.o.   MRN: [id number]  CC: Dizziness; Fatigue; and Shortness of Breath   HPI: [name] is a 65 y.o. 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 She has thought that she has been dehydrated recently, not drinking much water but started drinking more fluids yesterday. No dysuria, no nausea. Has had some headaches.   Depression screen [facility] 2/9 11/17/2015 08/25/2015 05/02/2015 12/25/2014  Decreased Interest 0 0 0 0  Down, Depressed, Hopeless 0 0 0 0  PHQ - 2 Score 0 0 0 0     Relevant past medical, surgical, family and social history reviewed and updated as indicated. Interim medical history since our last visit reviewed. Allergies and medications reviewed and updated.    ROS: Per HPI unless specifically indicated above  History  Smoking status  . Never Smoker   Smokeless tobacco  . Never Used    Past Medical History Patient Active Problem List   Diagnosis Date Noted  . Vitamin D deficiency 01/21/2015  . Palpitations 04/15/2014  . GERD (gastroesophageal reflux disease)   . Arthritis   . History of uterine cancer   . Hyperlipidemia   . Hypertension   . Osteoporosis   . Anxiety   . Colon polyps   . Esophageal stricture   . Pacemaker   . 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 calcium citrate-vitamin D (CITRACAL+D) 315-200 MG-UNIT per tablet Take 1 tablet by mouth daily.     Marland Kitchen ezetimibe (ZETIA) 10 MG tablet Take 1 tablet (10 mg total) by mouth daily. 90 tablet 1  . nebivolol (BYSTOLIC) 5 MG tablet Take 1 tablet (5 mg total) by mouth 2 (two) times daily. 60 tablet 11  . omega-3 acid ethyl esters (LOVAZA) 1 G capsule Take 2 capsules (2 g total) by mouth daily. 180 capsule 4  . pantoprazole (PROTONIX) 40 MG tablet Take 1 tablet (40 mg total) by mouth daily. 90 tablet 4  . sertraline (ZOLOFT) 50 MG tablet Take 0.5 tablets (25 mg total) by mouth daily. 90 tablet 4  . Vitamin D, Ergocalciferol, (DRISDOL) 50000 UNITS CAPS capsule Take 1 capsule by mouth once a week.  2  . [DISCONTINUED] metoprolol succinate (TOPROL-XL) 25 MG 24 hr tablet Take 1 tablet (25 mg total) by mouth daily. 90 tablet 3   No current facility-administered medications for this visit.       Objective:    BP 128/78 mmHg  Pulse 80  Temp(Src) 97.4 F (36.3 C) (Oral)  Ht '5\' 4"'  (1.626 m)  Wt 168 lb (76.204 kg)  BMI 28.82 kg/m2  Wt Readings from Last 3 Encounters:  12/29/15 168 lb (76.204 kg)  11/17/15 167 lb (75.751 kg)  09/06/15 166 lb 6.4 oz (75.479  kg)     Gen: NAD, alert, cooperative with exam, NCAT EYES: EOMI, no scleral injection or icterus ENT:  TMs not able to visualize due to cerumen, OP without erythema LYMPH: no cervical LAD CV: NRRR, normal S1/S2, no murmur, distal pulses 2+ b/l Resp: CTABL, no wheezes, normal WOB Abd: +BS, soft, NTND. no guarding or organomegaly Ext: No edema, warm Neuro: Alert and oriented, strength equal b/l UE and LE, sensation intact throughout, CN III-XII intact coordination grossly normal MSK: normal muscle bulk     Assessment & Plan:    [name] was seen today for episode of pre-syncope yesterday. Pt with pacemaker that has been functioning normally q62mowith intermittent checks per pt, last  check in Nov 2016. PM placed for pauses. Pt on bystolic, HR 80 today. Vasovagal vs cardiac vs other cause of pre-syncope. No obvious infections today. Had labs drawn 6 weeks ago, glucose up to 157, no CBC at that time. Will repeat BMP, get CBC and HgA1c. RTC in 2 weeks, sooner if needed.  Diagnoses and all orders for this visit:  Vasovagal near syncope -     CBC -     BMP8+EGFR -     POCT glycosylated hemoglobin (Hb A1C)  Follow up plan: Return in about 2 weeks (around 01/12/2016).  CAssunta Found MD [facility] 12/29/2015, 5:37 PM

## 2015-12-29 NOTE — Telephone Encounter (Signed)
I spoke with patient and let her know to call her PCP in regards to her complaint.

## 2015-12-31 LAB — CBC
Hematocrit: 42.8 % (ref 34.0–46.6)
Hemoglobin: 14.2 g/dL (ref 11.1–15.9)
MCH: 30.3 pg (ref 26.6–33.0)
MCHC: 33.2 g/dL (ref 31.5–35.7)
MCV: 92 fL (ref 79–97)
PLATELETS: 232 10*3/uL (ref 150–379)
RBC: 4.68 x10E6/uL (ref 3.77–5.28)
RDW: 13.3 % (ref 12.3–15.4)
WBC: 7.8 10*3/uL (ref 3.4–10.8)

## 2015-12-31 LAB — BMP8+EGFR
BUN / CREAT RATIO: 21 (ref 11–26)
BUN: 14 mg/dL (ref 8–27)
CHLORIDE: 100 mmol/L (ref 96–106)
CO2: 22 mmol/L (ref 18–29)
Calcium: 10.3 mg/dL (ref 8.7–10.3)
Creatinine, Ser: 0.68 mg/dL (ref 0.57–1.00)
GFR calc Af Amer: 107 mL/min/{1.73_m2} (ref >59)
GFR calc non Af Amer: 93 mL/min/{1.73_m2} (ref >59)
GLUCOSE: 178 mg/dL — AB (ref 65–99)
Potassium: 4.1 mmol/L (ref 3.5–5.2)
SODIUM: 142 mmol/L (ref 134–144)

## 2016-01-05 ENCOUNTER — Other Ambulatory Visit: Payer: Self-pay | Admitting: *Deleted

## 2016-01-05 MED ORDER — METFORMIN HCL 500 MG PO TABS: 500.0000 mg | ORAL_TABLET | Freq: Two times a day (BID) | ORAL | Status: DC

## 2016-01-12 ENCOUNTER — Ambulatory Visit (INDEPENDENT_AMBULATORY_CARE_PROVIDER_SITE_OTHER): Payer: BLUE CROSS/BLUE SHIELD | Admitting: Pediatrics

## 2016-01-12 ENCOUNTER — Encounter: Payer: Self-pay | Admitting: Pediatrics

## 2016-01-12 VITALS — BP 125/79 | HR 74 | Temp 97.1°F | Ht 64.0 in | Wt 169.8 lb

## 2016-01-12 DIAGNOSIS — E785 Hyperlipidemia, unspecified: Secondary | ICD-10-CM | POA: Diagnosis not present

## 2016-01-12 DIAGNOSIS — E119 Type 2 diabetes mellitus without complications: Secondary | ICD-10-CM

## 2016-01-12 MED ORDER — PRAVASTATIN SODIUM 20 MG PO TABS: 20.0000 mg | ORAL_TABLET | Freq: Every day | ORAL | Status: DC

## 2016-01-12 NOTE — Progress Notes (Signed)
Subjective:    Patient ID: [name], female    DOB: 1951-09-18, 65 y.o.   MRN: [id number]  CC: Diabetes   HPI: [name] is a 65 y.o. female presenting for Diabetes  Taking metformin. No side effects.  HLD: crestor caused muscle cramps in the past. Isnt sure if has been on other cholesterol meds in the past  DM2: [name] at recent clinic visit. Back to discuss new diagnosis and treatment. Drinks 3-4 regular pepsis a day.   Depression screen [facility] 2/9 01/12/2016 11/17/2015 08/25/2015 05/02/2015 12/25/2014  Decreased Interest 0 0 0 0 0  Down, Depressed, Hopeless 0 0 0 0 0  PHQ - 2 Score 0 0 0 0 0     Relevant past medical, surgical, family and social history reviewed and updated as indicated. Interim medical history since our last visit reviewed. Allergies and medications reviewed and updated.    ROS: Per HPI unless specifically indicated above  History  Smoking status  . Never Smoker   Smokeless tobacco  . Never Used    Past Medical History Patient Active Problem List   Diagnosis Date Noted  . Type 2 diabetes mellitus without complication, without long-term current use of insulin ([place]) 01/12/2016  . Vitamin D deficiency 01/21/2015  . Palpitations 04/15/2014  . GERD (gastroesophageal reflux disease)   . Arthritis   . History of uterine cancer   . Hyperlipidemia   . Hypertension   . Osteoporosis   . Anxiety   . Colon polyps   . Esophageal stricture   . Pacemaker   . 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 calcium citrate-vitamin D (CITRACAL+D) 315-200 MG-UNIT per tablet Take 1 tablet by mouth daily.     Marland Kitchen ezetimibe (ZETIA) 10 MG tablet Take 1 tablet (10 mg total) by mouth daily. 90 tablet 1  . metFORMIN (GLUCOPHAGE) 500 MG tablet Take 1 tablet (500 mg total) by mouth 2 (two) times daily with a meal. (Patient taking differently: Take 250 mg by mouth 2 (two) times daily with a meal. ) 60 tablet 3  .  nebivolol (BYSTOLIC) 5 MG tablet Take 1 tablet (5 mg total) by mouth 2 (two) times daily. 60 tablet 11  . omega-3 acid ethyl esters (LOVAZA) 1 G capsule Take 2 capsules (2 g total) by mouth daily. 180 capsule 4  . pantoprazole (PROTONIX) 40 MG tablet Take 1 tablet (40 mg total) by mouth daily. 90 tablet 4  . sertraline (ZOLOFT) 50 MG tablet Take 0.5 tablets (25 mg total) by mouth daily. 90 tablet 4  . pravastatin (PRAVACHOL) 20 MG tablet Take 1 tablet (20 mg total) by mouth daily. 90 tablet 3  . [DISCONTINUED] metoprolol succinate (TOPROL-XL) 25 MG 24 hr tablet Take 1 tablet (25 mg total) by mouth daily. 90 tablet 3   No current facility-administered medications for this visit.       Objective:    BP 125/79 mmHg  Pulse 74  Temp(Src) 97.1 F (36.2 C) (Oral)  Ht 5\' 4"  (1.626 m)  Wt 169 lb 12.8 oz (77.021 kg)  BMI 29.13 kg/m2  Wt Readings from Last 3 Encounters:  01/12/16 169 lb 12.8 oz (77.021 kg)  12/29/15 168 lb (76.204 kg)  11/17/15 167 lb (75.751 kg)     Gen: NAD, alert, cooperative with exam, NCAT EYES: EOMI, no scleral injection or icterus ENT:  OP without erythema LYMPH: no cervical LAD CV: NRRR, normal S1/S2, no  murmur, distal pulses 2+ b/l Resp: CTABL, no wheezes, normal WOB Ext: No edema, warm Neuro: Alert and oriented, strength equal b/l UE and LE, coordination grossly normal MSK: normal muscle bulk Skin: normal skin b/l feet     Assessment & Plan:    [name] was seen today for new diagnosis of diabetes.  Diagnoses and all orders for this visit:  Type 2 diabetes mellitus without complication, without long-term current use of insulin ([place]) Discussed pathophysiology of disease, maintenance including eye exams, foot exams, regular appts to follow HgA1c, diet and exercise changes. Pt quesitons answered. HgA1c 6.9. -     Microalbumin/Creatinine Ratio, Urine  Hyperlipidemia Will start pravastatin low dose to see if tolerates, has had leg cramps in past with some  statins. -     pravastatin (PRAVACHOL) 20 MG tablet; Take 1 tablet (20 mg total) by mouth daily.   Follow up plan: Return for 3 months to see [name] for DM f/u, 6 mo to see [name].  Stephanie Found, MD [facility] 01/12/2016, 2:49 PM

## 2016-01-12 NOTE — Patient Instructions (Signed)
Goals: Decrease pepsi intake to zero over next couple of weeks Walk for 20-30 minutes 3-4 times a week Increase fruit and vegetable intake

## 2016-01-13 LAB — MICROALBUMIN / CREATININE URINE RATIO
CREATININE, UR: 94.2 mg/dL
MICROALB/CREAT RATIO: 33.1 mg/g creat — ABNORMAL HIGH (ref 0.0–30.0)
MICROALBUM., U, RANDOM: 31.2 ug/mL

## 2016-02-14 ENCOUNTER — Encounter: Payer: BLUE CROSS/BLUE SHIELD | Admitting: *Deleted

## 2016-02-14 LAB — HM MAMMOGRAPHY

## 2016-02-17 ENCOUNTER — Encounter: Payer: Self-pay | Admitting: *Deleted

## 2016-03-08 ENCOUNTER — Ambulatory Visit (INDEPENDENT_AMBULATORY_CARE_PROVIDER_SITE_OTHER): Payer: BLUE CROSS/BLUE SHIELD | Admitting: *Deleted

## 2016-03-08 ENCOUNTER — Telehealth: Payer: Self-pay | Admitting: Cardiology

## 2016-03-08 DIAGNOSIS — Z950 Presence of cardiac pacemaker: Secondary | ICD-10-CM

## 2016-03-08 DIAGNOSIS — R001 Bradycardia, unspecified: Secondary | ICD-10-CM | POA: Diagnosis not present

## 2016-03-08 NOTE — Progress Notes (Signed)
Remote pacemaker transmission.   

## 2016-03-08 NOTE — Telephone Encounter (Signed)
Spoke with pt and reminded pt of remote transmission that is due today. Pt verbalized understanding.   

## 2016-04-05 LAB — CUP PACEART REMOTE DEVICE CHECK
Battery Impedance: 109 Ohm
Battery Remaining Longevity: 160 mo
Battery Voltage: 2.79 V
Brady Statistic AS VS Percent: 100 %
Date Time Interrogation Session: 20170323132059
Implantable Lead Location: 753860
Implantable Lead Model: 5076
Lead Channel Impedance Value: 481 Ohm
Lead Channel Sensing Intrinsic Amplitude: 1.4 mV
Lead Channel Setting Pacing Amplitude: 2.5 V
Lead Channel Setting Pacing Pulse Width: 0.4 ms
MDC IDC LEAD IMPLANT DT: 20050914
MDC IDC LEAD IMPLANT DT: 20050914
MDC IDC LEAD LOCATION: 753859
MDC IDC MSMT LEADCHNL RA PACING THRESHOLD AMPLITUDE: 0.375 V
MDC IDC MSMT LEADCHNL RA PACING THRESHOLD PULSEWIDTH: 0.4 ms
MDC IDC MSMT LEADCHNL RV IMPEDANCE VALUE: 569 Ohm
MDC IDC MSMT LEADCHNL RV PACING THRESHOLD AMPLITUDE: 0.75 V
MDC IDC MSMT LEADCHNL RV PACING THRESHOLD PULSEWIDTH: 0.4 ms
MDC IDC MSMT LEADCHNL RV SENSING INTR AMPL: 8 mV
MDC IDC SET LEADCHNL RA PACING AMPLITUDE: 2 V
MDC IDC SET LEADCHNL RV SENSING SENSITIVITY: 5.6 mV
MDC IDC STAT BRADY AP VP PERCENT: 0 %
MDC IDC STAT BRADY AP VS PERCENT: 0 %
MDC IDC STAT BRADY AS VP PERCENT: 0 %

## 2016-04-06 ENCOUNTER — Encounter: Payer: Self-pay | Admitting: Cardiology

## 2016-04-11 ENCOUNTER — Encounter: Payer: Self-pay | Admitting: Pharmacist

## 2016-04-11 ENCOUNTER — Ambulatory Visit (INDEPENDENT_AMBULATORY_CARE_PROVIDER_SITE_OTHER): Payer: BLUE CROSS/BLUE SHIELD | Admitting: Pharmacist

## 2016-04-11 VITALS — BP 120/70 | HR 77 | Ht 64.0 in | Wt 165.0 lb

## 2016-04-11 DIAGNOSIS — E119 Type 2 diabetes mellitus without complications: Secondary | ICD-10-CM

## 2016-04-11 DIAGNOSIS — E785 Hyperlipidemia, unspecified: Secondary | ICD-10-CM

## 2016-04-11 LAB — BAYER DCA HB A1C WAIVED: HB A1C: 7.4 % — AB (ref <7.0)

## 2016-04-11 MED ORDER — EZETIMIBE 10 MG PO TABS: 10.0000 mg | ORAL_TABLET | Freq: Every day | ORAL | Status: DC

## 2016-04-11 MED ORDER — ONETOUCH DELICA LANCETS FINE MISC: Status: DC

## 2016-04-11 MED ORDER — PANTOPRAZOLE SODIUM 20 MG PO TBEC: 20.0000 mg | DELAYED_RELEASE_TABLET | Freq: Every day | ORAL | Status: DC

## 2016-04-11 MED ORDER — GLUCOSE BLOOD VI STRP: ORAL_STRIP | Status: DC

## 2016-04-11 NOTE — Progress Notes (Signed)
Subjective:    [name] is a 65 y.o. female who presents for an initial evaluation of Type 2 diabetes mellitus.  Current symptoms/problems include none   The patient was initially diagnosed with Type 2 diabetes mellitus based on the following criteria:  A1c of 6.9% (12/29/2015).  She was started on metformin 500mg  bid but felt that it was making her muscles hurt so she is not taking metformin 500mg  1/2 tablet qd.  Patient is suppose to be taking pravastatin but she stopped because she felt it was causing diarrhea.  Patient also mentions that she is concerned with possible effect of PPI on her memory and would like to stop taking it.   Known diabetic complications: none Cardiovascular risk factors: diabetes mellitus, dyslipidemia and sedentary lifestyle  Eye exam current (within one year): no Weight trend: stable Prior visit with dietician: no Current diet: she has not been limiting high sugar or CHO foods until January 2017. She was drinking several regular sodas per day but now only has about 1 per day. Current exercise: none  Current monitoring regimen: none Any episodes of hypoglycemia? no  Is She on ACE inhibitor or angiotensin II receptor blocker?  No   The following portions of the patient's history were reviewed and updated as appropriate: allergies, current medications, past family history, past medical history, past social history, past surgical history and problem list.      Objective:    BP 120/70 mmHg  Pulse 77  Ht 5\' 4"  (1.626 m)  Wt 165 lb (74.844 kg)  BMI 28.31 kg/m2   A1c = 7.5% today in office    Lab Review GLUCOSE (mg/dL)  Date Value  12/29/2015 178*  11/17/2015 157*  05/02/2015 133*   GLUCOSE, BLD  Date Value  05/14/2014 157 mg/dL*  03/02/2013 109 mg/dL*  11/12/2007 140*   CO2 (mmol/L)  Date Value  12/29/2015 22  11/17/2015 25  05/02/2015 24   BUN  Date Value  12/29/2015 14 mg/dL  11/17/2015 14 mg/dL  05/02/2015 16 mg/dL   05/14/2014 9 mg/dL  03/02/2013 13 mg/dL  11/12/2007 5*   CREAT (mg/dL)  Date Value  03/02/2013 0.64   CREATININE, SER (mg/dL)  Date Value  12/29/2015 0.68  11/17/2015 0.65  05/02/2015 0.60    Assessment:    Diabetes Mellitus type II, under inadequate control.    Plan:    1.  Rx changes: increase metformin 500mg  qd  Decreased protonix to 20mg  daily for 1 month, then qod for 1 month then stop.  Patient can use Pepcid Complete as needed for GERD  2.  Education: Reviewed 'ABCs' of diabetes management (respective goals in  3.  Discussed CHO limiting diet, serving size recommendations and the plate method of balancing meals.  She was taught how to read nutrition labels 4.  Discussed BG targets for HBG monitoring. Patient given glucometer in office and taught to use.  Rx for testing supplies sent to pharmacy.  5.  Encouraged her to increase physical activity as this is key to good BG control.  Cherre Robins, PharmD, CPP

## 2016-04-11 NOTE — Patient Instructions (Addendum)
Pantoprazole / Protonix Taper  Month 1:  Decrease to pantoprazole 74m once daily   Month 2: Change pantoprazole 257mto every other day.  May use Pepcid Complete on in between days  Month 3: discontinue pantoprazole. Can continue Pepcid Complete   Diabetes and Standards of Medical Care   Diabetes is complicated. You may find that your diabetes team includes a dietitian, nurse, diabetes educator, eye doctor, and more. To help everyone know what is going on and to help you get the care you deserve, the following schedule of care was developed to help keep you on track. Below are the tests, exams, vaccines, medicines, education, and plans you will need.  Blood Glucose Goals  Prior to meals = 80 - 130 Within 2 hours of the start of a meal = less than 180  HbA1c test (goal is less than 7.0% - your last value was 6.9 %) This test shows how well you have controlled your glucose over the past 2 to 3 months. It is used to see if your diabetes management plan needs to be adjusted.   It is performed at least 2 times a year if you are meeting treatment goals.  It is performed 4 times a year if therapy has changed or if you are not meeting treatment goals.  Blood pressure test  This test is performed at every routine medical visit. The goal is less than 140/90 mmHg for most people, but 130/80 mmHg in some cases. Ask your health care provider about your goal.  Dental exam  Follow up with the dentist regularly.  Eye exam  If you are diagnosed with type 1 diabetes as a child, get an exam upon reaching the age of 1082ears or older and have had diabetes for 3 to 5 years. Yearly eye exams are recommended after that initial eye exam.  If you are diagnosed with type 1 diabetes as an adult, get an exam within 5 years of diagnosis and then yearly.  If you are diagnosed with type 2 diabetes, get an exam as soon as possible after the diagnosis and then yearly.  Foot care exam  Visual foot exams are  performed at every routine medical visit. The exams check for cuts, injuries, or other problems with the feet.  A comprehensive foot exam should be done yearly. This includes visual inspection as well as assessing foot pulses and testing for loss of sensation.  Check your feet nightly for cuts, injuries, or other problems with your feet. Tell your health care provider if anything is not healing.  Kidney function test (urine microalbumin)  This test is performed once a year.  Type 1 diabetes: The first test is performed 5 years after diagnosis.  Type 2 diabetes: The first test is performed at the time of diagnosis.  A serum creatinine and estimated glomerular filtration rate (eGFR) test is done once a year to assess the level of chronic kidney disease (CKD), if present.  Lipid profile (cholesterol, HDL, LDL, triglycerides)  Performed every 5 years for most people.  The goal for LDL is less than 100 mg/dL. If you are at high risk, the goal is less than 70 mg/dL.  The goal for HDL is 40 mg/dL to 50 mg/dL for men and 50 mg/dL to 60 mg/dL for women. An HDL cholesterol of 60 mg/dL or higher gives some protection against heart disease.  The goal for triglycerides is less than 150 mg/dL.  Influenza vaccine, pneumococcal vaccine, and hepatitis B vaccine  The influenza vaccine is recommended yearly.  The pneumococcal vaccine is generally given once in a lifetime. However, there are some instances when another vaccination is recommended. Check with your health care provider.  The hepatitis B vaccine is also recommended for adults with diabetes.  Diabetes self-management education  Education is recommended at diagnosis and ongoing as needed.  Treatment plan  Your treatment plan is reviewed at every medical visit.  Document Released: 09/30/2009 Document Revised: 08/05/2013 Document Reviewed: 05/05/2013 [facility] Patient Information 2014 [place].

## 2016-04-12 LAB — CMP14+EGFR
A/G RATIO: 1.6 (ref 1.2–2.2)
ALBUMIN: 4.2 g/dL (ref 3.6–4.8)
ALK PHOS: 77 IU/L (ref 39–117)
ALT: 17 IU/L (ref 0–32)
AST: 15 IU/L (ref 0–40)
BILIRUBIN TOTAL: 0.4 mg/dL (ref 0.0–1.2)
BUN / CREAT RATIO: 22 (ref 12–28)
BUN: 14 mg/dL (ref 8–27)
CHLORIDE: 101 mmol/L (ref 96–106)
CO2: 24 mmol/L (ref 18–29)
Calcium: 9.5 mg/dL (ref 8.7–10.3)
Creatinine, Ser: 0.64 mg/dL (ref 0.57–1.00)
GFR calc non Af Amer: 95 mL/min/{1.73_m2} (ref >59)
GFR, EST AFRICAN AMERICAN: 109 mL/min/{1.73_m2} (ref >59)
Globulin, Total: 2.7 g/dL (ref 1.5–4.5)
Glucose: 162 mg/dL — ABNORMAL HIGH (ref 65–99)
POTASSIUM: 4.3 mmol/L (ref 3.5–5.2)
Sodium: 140 mmol/L (ref 134–144)
TOTAL PROTEIN: 6.9 g/dL (ref 6.0–8.5)

## 2016-04-12 LAB — LIPID PANEL
Chol/HDL Ratio: 3.9 ratio units (ref 0.0–4.4)
Cholesterol, Total: 191 mg/dL (ref 100–199)
HDL: 49 mg/dL (ref >39)
LDL Calculated: 88 mg/dL (ref 0–99)
Triglycerides: 271 mg/dL — ABNORMAL HIGH (ref 0–149)
VLDL Cholesterol Cal: 54 mg/dL — ABNORMAL HIGH (ref 5–40)

## 2016-04-12 LAB — LDL CHOLESTEROL, DIRECT: LDL DIRECT: 111 mg/dL — AB (ref 0–99)

## 2016-04-20 ENCOUNTER — Encounter: Payer: Self-pay | Admitting: Cardiology

## 2016-04-23 ENCOUNTER — Encounter: Payer: Self-pay | Admitting: *Deleted

## 2016-04-23 LAB — HM DIABETES EYE EXAM

## 2016-06-07 ENCOUNTER — Ambulatory Visit (INDEPENDENT_AMBULATORY_CARE_PROVIDER_SITE_OTHER): Payer: BLUE CROSS/BLUE SHIELD | Admitting: *Deleted

## 2016-06-07 ENCOUNTER — Telehealth: Payer: Self-pay | Admitting: Cardiology

## 2016-06-07 DIAGNOSIS — Z950 Presence of cardiac pacemaker: Secondary | ICD-10-CM | POA: Diagnosis not present

## 2016-06-07 DIAGNOSIS — R001 Bradycardia, unspecified: Secondary | ICD-10-CM

## 2016-06-07 NOTE — Progress Notes (Signed)
Remote pacemaker transmission.   

## 2016-06-07 NOTE — Telephone Encounter (Signed)
Spoke with pt and reminded pt of remote transmission that is due today. Pt verbalized understanding.   

## 2016-06-09 LAB — CUP PACEART REMOTE DEVICE CHECK
Battery Impedance: 132 Ohm
Battery Remaining Longevity: 154 mo
Brady Statistic AS VS Percent: 100 %
Date Time Interrogation Session: 20170622155038
Implantable Lead Implant Date: 20050914
Implantable Lead Location: 753859
Implantable Lead Location: 753860
Implantable Lead Model: 5076
Lead Channel Sensing Intrinsic Amplitude: 1.4 mV
Lead Channel Sensing Intrinsic Amplitude: 8 mV
Lead Channel Setting Pacing Amplitude: 2.5 V
Lead Channel Setting Pacing Pulse Width: 0.4 ms
MDC IDC LEAD IMPLANT DT: 20050914
MDC IDC MSMT BATTERY VOLTAGE: 2.78 V
MDC IDC MSMT LEADCHNL RA IMPEDANCE VALUE: 488 Ohm
MDC IDC MSMT LEADCHNL RA PACING THRESHOLD AMPLITUDE: 0.375 V
MDC IDC MSMT LEADCHNL RA PACING THRESHOLD PULSEWIDTH: 0.4 ms
MDC IDC MSMT LEADCHNL RV IMPEDANCE VALUE: 582 Ohm
MDC IDC MSMT LEADCHNL RV PACING THRESHOLD AMPLITUDE: 0.75 V
MDC IDC MSMT LEADCHNL RV PACING THRESHOLD PULSEWIDTH: 0.4 ms
MDC IDC SET LEADCHNL RA PACING AMPLITUDE: 2 V
MDC IDC SET LEADCHNL RV SENSING SENSITIVITY: 4 mV
MDC IDC STAT BRADY AP VP PERCENT: 0 %
MDC IDC STAT BRADY AP VS PERCENT: 0 %
MDC IDC STAT BRADY AS VP PERCENT: 0 %

## 2016-07-11 ENCOUNTER — Ambulatory Visit: Payer: BLUE CROSS/BLUE SHIELD | Admitting: Pediatrics

## 2016-07-18 ENCOUNTER — Ambulatory Visit (INDEPENDENT_AMBULATORY_CARE_PROVIDER_SITE_OTHER): Payer: BLUE CROSS/BLUE SHIELD | Admitting: Pediatrics

## 2016-07-18 ENCOUNTER — Encounter: Payer: Self-pay | Admitting: Pediatrics

## 2016-07-18 VITALS — BP 143/82 | HR 76 | Temp 97.4°F | Ht 64.0 in | Wt 171.6 lb

## 2016-07-18 DIAGNOSIS — R809 Proteinuria, unspecified: Secondary | ICD-10-CM

## 2016-07-18 DIAGNOSIS — E785 Hyperlipidemia, unspecified: Secondary | ICD-10-CM | POA: Diagnosis not present

## 2016-07-18 DIAGNOSIS — I10 Essential (primary) hypertension: Secondary | ICD-10-CM

## 2016-07-18 DIAGNOSIS — E119 Type 2 diabetes mellitus without complications: Secondary | ICD-10-CM

## 2016-07-18 LAB — BAYER DCA HB A1C WAIVED: HB A1C: 7.1 % — AB (ref <7.0)

## 2016-07-18 MED ORDER — LISINOPRIL 5 MG PO TABS: 5.0000 mg | ORAL_TABLET | Freq: Every day | ORAL | 3 refills | Status: DC

## 2016-07-18 MED ORDER — METFORMIN HCL ER 500 MG PO TB24: 1000.0000 mg | ORAL_TABLET | Freq: Every day | ORAL | 3 refills | Status: DC

## 2016-07-18 MED ORDER — ROSUVASTATIN CALCIUM 5 MG PO TABS: 5.0000 mg | ORAL_TABLET | Freq: Every day | ORAL | 3 refills | Status: DC

## 2016-07-18 NOTE — Patient Instructions (Addendum)
Start increasing metformin at night Start lisinopril 5mg  once a day in the morning for the protein in your urine and blood pressure Start rosuvastatin 5mg  for your cholesterol, take once a day at night  Let me know if you have any problems Check your blood pressures at home, let me know if regularly elevated >140 on top or >90 on bottom  Can take ibuprofen twice a week for headaches

## 2016-07-18 NOTE — Progress Notes (Signed)
    Subjective:    Patient ID: [name], female    DOB: 1951/04/03, 65 y.o.   MRN: [id number]  CC: Follow-up (6 month diabetes)   HPI: [name] is a 65 y.o. female presenting for Follow-up (6 month diabetes)  Has PM for pauses in heart beat, follows with cardiology  Eye exam: completed at [place], next to [facility]  DM2: taking 500mg  metformin qhs Avoiding sugar  HTN: occasional headaches, across forehead at times other places No chest pain  Otherwise feeling well, no fevers  Depression screen [facility] 2/9 07/18/2016 01/12/2016 11/17/2015 08/25/2015 05/02/2015  Decreased Interest 0 0 0 0 0  Down, Depressed, Hopeless 0 0 0 0 0  PHQ - 2 Score 0 0 0 0 0     Relevant past medical, surgical, family and social history reviewed and updated. Interim medical history since our last visit reviewed. Allergies and medications reviewed and updated.  History  Smoking Status  . Never Smoker  Smokeless Tobacco  . Never Used    ROS: Per HPI      Objective:    BP (!) 143/82 (BP Location: Left Arm, Patient Position: Sitting, Cuff Size: Normal)   Pulse 76   Temp 97.4 F (36.3 C) (Oral)   Ht 5\' 4"  (1.626 m)   Wt 171 lb 9.6 oz (77.8 kg)   BMI 29.46 kg/m   Wt Readings from Last 3 Encounters:  07/18/16 171 lb 9.6 oz (77.8 kg)  04/11/16 165 lb (74.8 kg)  01/12/16 169 lb 12.8 oz (77 kg)     Gen: NAD, alert, cooperative with exam, NCAT EYES: EOMI, no scleral injection or icterus ENT:   OP without erythema LYMPH: no cervical LAD CV: NRRR, normal S1/S2, no murmur, distal pulses 2+ b/l Resp: CTABL, no wheezes, normal WOB Ext: No edema, warm Neuro: Alert and oriented, strength equal b/l UE and LE, coordination grossly normal MSK: normal muscle bulk     Assessment & Plan:    [name] was seen today for follow-up multiple med problems  Diagnoses and all orders for this visit:  Type 2 diabetes mellitus without complication, without long-term current use of insulin  (HCC) HgA1c 7.1 Increase metformin if able Eye exam UTD, completed at [facility] in [place] DCA Hb A1c Waived -     metFORMIN (GLUCOPHAGE XR) 500 MG 24 hr tablet; Take 2 tablets (1,000 mg total) by mouth daily with breakfast. -     rosuvastatin (CRESTOR) 5 MG tablet; Take 1 tablet (5 mg total) by mouth daily. -     lisinopril (PRINIVIL,ZESTRIL) 5 MG tablet; Take 1 tablet (5 mg total) by mouth daily.  Essential hypertension Elevated May need increase in lisinopril -     lisinopril (PRINIVIL,ZESTRIL) 5 MG tablet; Take 1 tablet (5 mg total) by mouth daily.  Microalbuminuria -     lisinopril (PRINIVIL,ZESTRIL) 5 MG tablet; Take 1 tablet (5 mg total) by mouth daily.  Hyperlipidemia Says she tolerated crestor better than pravastatin, wants to try again -     rosuvastatin (CRESTOR) 5 MG tablet; Take 1 tablet (5 mg total) by mouth daily.   Follow up plan: Return in about 3 months (around 10/18/2016) for DM2 follow up.  Assunta Found, MD [facility] 07/18/2016, 2:41 PM

## 2016-08-23 ENCOUNTER — Other Ambulatory Visit: Payer: Self-pay | Admitting: Family

## 2016-08-23 DIAGNOSIS — E785 Hyperlipidemia, unspecified: Secondary | ICD-10-CM

## 2016-10-09 ENCOUNTER — Encounter: Payer: Self-pay | Admitting: Internal Medicine

## 2016-10-19 ENCOUNTER — Ambulatory Visit (INDEPENDENT_AMBULATORY_CARE_PROVIDER_SITE_OTHER): Payer: PPO | Admitting: Internal Medicine

## 2016-10-19 ENCOUNTER — Encounter: Payer: Self-pay | Admitting: Internal Medicine

## 2016-10-19 ENCOUNTER — Ambulatory Visit: Payer: BLUE CROSS/BLUE SHIELD | Admitting: Pediatrics

## 2016-10-19 VITALS — BP 154/88 | HR 72 | Ht 64.0 in | Wt 170.0 lb

## 2016-10-19 DIAGNOSIS — Z950 Presence of cardiac pacemaker: Secondary | ICD-10-CM

## 2016-10-19 NOTE — Patient Instructions (Addendum)
Medication Instructions:  Your physician recommends that you continue on your current medications as directed. Please refer to the Current Medication list given to you today.   Labwork: None Ordered   Testing/Procedures: None Ordered   Follow-Up: Your physician wants you to follow-up in: 1 year with Dr. Taylor You will receive a reminder letter in the mail two months in advance. If you don't receive a letter, please call our office to schedule the follow-up appointment.  Remote monitoring is used to monitor your Pacemaker from home. This monitoring reduces the number of office visits required to check your device to one time per year. It allows us to keep an eye on the functioning of your device to ensure it is working properly. You are scheduled for a device check from home on 01/21/17. You may send your transmission at any time that day. If you have a wireless device, the transmission will be sent automatically. After your physician reviews your transmission, you will receive a postcard with your next transmission date.    Any Other Special Instructions Will Be Listed Below (If Applicable).     If you need a refill on your cardiac medications before your next appointment, please call your pharmacy.   

## 2016-10-19 NOTE — Progress Notes (Signed)
HPI [name] returns today for followup. She is a pleasant 65 yo woman with a h/o symptomatic bradycardia, s/p PPM. She also has a h/o dyslipidemia. In the interim she has done well. She denies chest pain or sob. No peripheral edema. She is retired and doing well.  Allergies  Allergen Reactions  . Crestor [Rosuvastatin] Other (See Comments)    Myalgias   . Pravachol [Pravastatin Sodium] Other (See Comments)    Stomach pain and constipation      Current Outpatient Prescriptions  Medication Sig Dispense Refill  . calcium citrate-vitamin D (CITRACAL+D) 315-200 MG-UNIT per tablet Take 1 tablet by mouth daily.     Marland Kitchen glucose blood test strip Use to check BG up to qd 100 each 2  . metFORMIN (GLUCOPHAGE XR) 500 MG 24 hr tablet Take 2 tablets (1,000 mg total) by mouth daily with breakfast. 60 tablet 3  . nebivolol (BYSTOLIC) 5 MG tablet Take 1 tablet (5 mg total) by mouth 2 (two) times daily. 60 tablet 11  . omega-3 acid ethyl esters (LOVAZA) 1 g capsule TAKE (2) CAPSULES DAILY. 180 capsule 0  . ONETOUCH DELICA LANCETS FINE MISC Use to check BG up to qd 100 each 2  . pantoprazole (PROTONIX) 20 MG tablet Take 1 tablet (20 mg total) by mouth daily. 30 tablet 0  . sertraline (ZOLOFT) 50 MG tablet Take 0.5 tablets (25 mg total) by mouth daily. 90 tablet 4   No current facility-administered medications for this visit.      Past Medical History:  Diagnosis Date  . Anxiety   . Arthritis    of the neck  . Colon polyps   . Esophageal stricture   . GERD (gastroesophageal reflux disease)    Hiatal Hernia  . History of uterine cancer   . Hyperlipidemia   . Hypertension   . Osteoporosis   . Pacemaker   . Symptomatic bradycardia     ROS:   All systems reviewed and negative except as noted in the HPI.   Past Surgical History:  Procedure Laterality Date  . APPENDECTOMY    . CHOLECYSTECTOMY    . LAPAROSCOPIC TOTAL HYSTERECTOMY    . LYMPH NODE DISSECTION     bilateral pelvic and  right-sided periaortic  . PACEMAKER GENERATOR CHANGE N/A 05/21/2014   Procedure: PACEMAKER GENERATOR CHANGE;  Surgeon: Evans Lance, MD;  Location[facility] CATH LAB;  Service: Cardiovascular;  Laterality: N/A;  . status post pacemaker     metronic [id number]  . TRANSTHORACIC ECHOCARDIOGRAM  08/30/04  . TUBAL LIGATION       Family History  Problem Relation Age of Onset  . Heart disease Mother   . Heart attack Mother   . Heart disease Father   . Heart attack Father   . Cancer Sister     breast  . Alcohol abuse Brother      Social History   Social History  . Marital status: Divorced    Spouse name: N/A  . Number of children: N/A  . Years of education: N/A   Occupational History  . employed     Engineer, manufacturing systems   Social History Main Topics  . Smoking status: Never Smoker  . Smokeless tobacco: Never Used  . Alcohol use No  . Drug use: No  . Sexual activity: Not on file   Other Topics Concern  . Not on file   Social History Narrative  . No narrative on file     BP (!) 154/88  Pulse 72   Ht 5\' 4"  (1.626 m)   Wt 170 lb (77.1 kg)   BMI 29.18 kg/m   Physical Exam:  Well appearing middle-aged woman, NAD HEENT: Unremarkable Neck:  No JVD, no thyromegally Lungs:  Clear with no wheezes, rales, or rhonchi. Well-healed pacemaker incision HEART:  Regular rate rhythm, no murmurs, no rubs, no clicks Abd:  soft, positive bowel sounds, no organomegally, no rebound, no guarding Ext:  2 plus pulses, no edema, no cyanosis, no clubbing Skin:  No rashes no nodules Neuro:  CN II through XII intact, motor grossly intact  ECG - NSR with LAE  DEVICE    See PaceArt for details. Normal DDD PM function.  Assess/Plan:  1. HTN - her blood pressure is up a bit. I have asked her to increase her physical activity and reduce her salt intake. She states that her pressures are usually better. 2. Sinus node dysfunction - she is asymptomatic, s/p PPM insertion 3. 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 Will recheck in several months.  Cristopher Peru, M.D.

## 2016-10-22 NOTE — Addendum Note (Signed)
Addended by: Zebedee Iba on: 10/22/2016 02:11 PM   Modules accepted: Orders

## 2016-10-29 ENCOUNTER — Other Ambulatory Visit: Payer: Self-pay | Admitting: Internal Medicine

## 2016-10-29 ENCOUNTER — Ambulatory Visit (INDEPENDENT_AMBULATORY_CARE_PROVIDER_SITE_OTHER): Payer: PPO | Admitting: Pediatrics

## 2016-10-29 ENCOUNTER — Encounter: Payer: Self-pay | Admitting: Pediatrics

## 2016-10-29 VITALS — BP 141/90 | HR 68 | Temp 97.0°F | Ht 64.0 in | Wt 170.6 lb

## 2016-10-29 DIAGNOSIS — Z23 Encounter for immunization: Secondary | ICD-10-CM

## 2016-10-29 DIAGNOSIS — Z6829 Body mass index (BMI) 29.0-29.9, adult: Secondary | ICD-10-CM | POA: Diagnosis not present

## 2016-10-29 DIAGNOSIS — E119 Type 2 diabetes mellitus without complications: Secondary | ICD-10-CM | POA: Diagnosis not present

## 2016-10-29 DIAGNOSIS — N898 Other specified noninflammatory disorders of vagina: Secondary | ICD-10-CM

## 2016-10-29 DIAGNOSIS — I10 Essential (primary) hypertension: Secondary | ICD-10-CM | POA: Diagnosis not present

## 2016-10-29 DIAGNOSIS — E785 Hyperlipidemia, unspecified: Secondary | ICD-10-CM

## 2016-10-29 LAB — BAYER DCA HB A1C WAIVED: HB A1C: 6.8 % (ref <7.0)

## 2016-10-29 MED ORDER — LOSARTAN POTASSIUM 25 MG PO TABS: 25.0000 mg | ORAL_TABLET | Freq: Every day | ORAL | 3 refills | Status: DC

## 2016-10-29 MED ORDER — NEBIVOLOL HCL 5 MG PO TABS: 5.0000 mg | ORAL_TABLET | Freq: Two times a day (BID) | ORAL | 11 refills | Status: DC

## 2016-10-29 NOTE — Patient Instructions (Signed)
Vagisil or replens feminine moisturising products

## 2016-10-29 NOTE — Progress Notes (Signed)
  Subjective:   Patient ID: [name], female    DOB: 1951/03/17, 65 y.o.   MRN: [id number] CC: Follow-up (3 month) multiple med problems HPI: [name] is a 65 y.o. female presenting for Follow-up (3 month)  GER: taking prontonix daily Helps with symptoms Tried decreasing to half tab, after a month symptoms returned to went back to taking   DM2: BGLs in 100s, highest 187, none over 200  HLD: intolerant of statins Due for repeat lipid leel  H/o Bradycardia: has a Medical sales representative regularly with cardiology  Elevated BMI: Tried to cut back on pepsi, not working very well  Vaginal itching, tried OTC monostat  Was tried on lisinopril, got reflux   Relevant past medical, surgical, family and social history reviewed. Allergies and medications reviewed and updated. History  Smoking Status  . Never Smoker  Smokeless Tobacco  . Never Used   ROS: Per HPI   Objective:    BP (!) 141/90   Pulse 68   Temp 97 F (36.1 C) (Oral)   Ht 5\' 4"  (1.626 m)   Wt 170 lb 9.6 oz (77.4 kg)   BMI 29.28 kg/m   Wt Readings from Last 3 Encounters:  10/29/16 170 lb 9.6 oz (77.4 kg)  10/19/16 170 lb (77.1 kg)  07/18/16 171 lb 9.6 oz (77.8 kg)    Gen: NAD, alert, cooperative with exam, NCAT EYES: EOMI, no conjunctival injection, or no icterus ENT:  TMs pearly gray b/l, OP without erythema LYMPH: no cervical LAD CV: NRRR, normal S1/S2, no murmur, distal pulses 2+ b/l Resp: CTABL, no wheezes, normal WOB Abd: +BS, soft, NTND. no guarding or organomegaly Ext: No edema, warm Neuro: Alert and oriented, strength equal b/l UE and LE, coordination grossly normal MSK: normal muscle bulk  Assessment & Plan:  [place] was seen today for follow-up multiple med problems.  Diagnoses and all orders for this visit:  Essential hypertension Elevated today Lisinopril made reflux worse so stopped Pt does have microalbuminuria Trial losartan -     nebivolol (BYSTOLIC) 5 MG tablet; Take 1 tablet  (5 mg total) by mouth 2 (two) times daily. -     losartan (COZAAR) 25 MG tablet; Take 1 tablet (25 mg total) by mouth daily.  Type 2 diabetes mellitus without complication, without long-term current use of insulin (HCC) Repeat A1c -     Lipid panel -     Bayer DCA Hb A1c Waived  Hyperlipidemia, unspecified hyperlipidemia type Repeat lipid panel  BMI 29.0-29.9,adult Cut out pepsi Discussed lifestyle changes  Follow up plan: Return in about 3 months (around 01/29/2017) for DM2 follow up. Assunta Found, MD [place]

## 2016-10-30 LAB — CUP PACEART INCLINIC DEVICE CHECK
Battery Voltage: 2.78 V
Brady Statistic RA Percent Paced: 0.1 % — CL
Brady Statistic RV Percent Paced: 0.1 % — CL
Date Time Interrogation Session: 20171114080054
Implantable Lead Location: 753859
Implantable Lead Location: 753860
Implantable Lead Model: 5076
Implantable Pulse Generator Implant Date: 20150605
Lead Channel Impedance Value: 449 Ohm
Lead Channel Pacing Threshold Amplitude: 0.5 V
Lead Channel Sensing Intrinsic Amplitude: 4 mV
Lead Channel Setting Pacing Pulse Width: 0.4 ms
Lead Channel Setting Sensing Sensitivity: 4 mV
MDC IDC LEAD IMPLANT DT: 20050914
MDC IDC LEAD IMPLANT DT: 20050914
MDC IDC MSMT LEADCHNL RA PACING THRESHOLD PULSEWIDTH: 0.4 ms
MDC IDC MSMT LEADCHNL RV IMPEDANCE VALUE: 571 Ohm
MDC IDC MSMT LEADCHNL RV PACING THRESHOLD AMPLITUDE: 0.75 V
MDC IDC MSMT LEADCHNL RV PACING THRESHOLD PULSEWIDTH: 0.4 ms
MDC IDC MSMT LEADCHNL RV SENSING INTR AMPL: 11.2 mV
MDC IDC SET LEADCHNL RA PACING AMPLITUDE: 2 V
MDC IDC SET LEADCHNL RV PACING AMPLITUDE: 2.5 V

## 2016-10-30 LAB — LIPID PANEL
CHOL/HDL RATIO: 5 ratio — AB (ref 0.0–4.4)
Cholesterol, Total: 221 mg/dL — ABNORMAL HIGH (ref 100–199)
HDL: 44 mg/dL (ref >39)
LDL CALC: 109 mg/dL — AB (ref 0–99)
TRIGLYCERIDES: 339 mg/dL — AB (ref 0–149)
VLDL Cholesterol Cal: 68 mg/dL — ABNORMAL HIGH (ref 5–40)

## 2016-12-13 ENCOUNTER — Other Ambulatory Visit: Payer: Self-pay | Admitting: Family

## 2016-12-13 DIAGNOSIS — K219 Gastro-esophageal reflux disease without esophagitis: Secondary | ICD-10-CM

## 2017-01-21 ENCOUNTER — Telehealth: Payer: Self-pay | Admitting: Cardiology

## 2017-01-21 ENCOUNTER — Ambulatory Visit (INDEPENDENT_AMBULATORY_CARE_PROVIDER_SITE_OTHER): Payer: PPO | Admitting: *Deleted

## 2017-01-21 DIAGNOSIS — R001 Bradycardia, unspecified: Secondary | ICD-10-CM | POA: Diagnosis not present

## 2017-01-21 NOTE — Telephone Encounter (Signed)
Spoke with pt and reminded pt of remote transmission that is due today. Pt verbalized understanding.   

## 2017-01-22 LAB — CUP PACEART REMOTE DEVICE CHECK
Battery Impedance: 156 Ohm
Brady Statistic AP VP Percent: 0 %
Brady Statistic AP VS Percent: 0 %
Brady Statistic AS VP Percent: 0 %
Brady Statistic AS VS Percent: 100 %
Implantable Lead Implant Date: 20050914
Implantable Lead Model: 5076
Implantable Lead Model: 5076
Lead Channel Impedance Value: 555 Ohm
Lead Channel Pacing Threshold Pulse Width: 0.4 ms
Lead Channel Setting Pacing Amplitude: 2.5 V
Lead Channel Setting Sensing Sensitivity: 4 mV
MDC IDC LEAD IMPLANT DT: 20050914
MDC IDC LEAD LOCATION: 753859
MDC IDC LEAD LOCATION: 753860
MDC IDC MSMT BATTERY REMAINING LONGEVITY: 147 mo
MDC IDC MSMT BATTERY VOLTAGE: 2.78 V
MDC IDC MSMT LEADCHNL RA IMPEDANCE VALUE: 468 Ohm
MDC IDC MSMT LEADCHNL RA PACING THRESHOLD AMPLITUDE: 0.5 V
MDC IDC MSMT LEADCHNL RV PACING THRESHOLD AMPLITUDE: 0.75 V
MDC IDC MSMT LEADCHNL RV PACING THRESHOLD PULSEWIDTH: 0.4 ms
MDC IDC PG IMPLANT DT: 20150605
MDC IDC SESS DTM: 20180205190348
MDC IDC SET LEADCHNL RA PACING AMPLITUDE: 2 V
MDC IDC SET LEADCHNL RV PACING PULSEWIDTH: 0.4 ms

## 2017-01-22 NOTE — Progress Notes (Signed)
Remote pacemaker transmission.   

## 2017-01-23 ENCOUNTER — Encounter: Payer: Self-pay | Admitting: Cardiology

## 2017-01-30 ENCOUNTER — Encounter: Payer: Self-pay | Admitting: Pediatrics

## 2017-01-30 ENCOUNTER — Ambulatory Visit (INDEPENDENT_AMBULATORY_CARE_PROVIDER_SITE_OTHER): Payer: PPO | Admitting: Pediatrics

## 2017-01-30 VITALS — BP 120/76 | HR 70 | Temp 97.2°F | Ht 64.0 in | Wt 168.6 lb

## 2017-01-30 DIAGNOSIS — E785 Hyperlipidemia, unspecified: Secondary | ICD-10-CM | POA: Diagnosis not present

## 2017-01-30 DIAGNOSIS — I10 Essential (primary) hypertension: Secondary | ICD-10-CM

## 2017-01-30 DIAGNOSIS — E119 Type 2 diabetes mellitus without complications: Secondary | ICD-10-CM

## 2017-01-30 DIAGNOSIS — K219 Gastro-esophageal reflux disease without esophagitis: Secondary | ICD-10-CM | POA: Diagnosis not present

## 2017-01-30 LAB — BAYER DCA HB A1C WAIVED: HB A1C: 7.2 % — AB (ref <7.0)

## 2017-01-30 MED ORDER — LISINOPRIL 2.5 MG PO TABS: 2.5000 mg | ORAL_TABLET | Freq: Every day | ORAL | 3 refills | Status: DC

## 2017-01-30 MED ORDER — METFORMIN HCL ER 500 MG PO TB24: 1000.0000 mg | ORAL_TABLET | Freq: Every day | ORAL | 3 refills | Status: DC

## 2017-01-30 MED ORDER — PITAVASTATIN CALCIUM 2 MG PO TABS: 2.0000 mg | ORAL_TABLET | Freq: Every day | ORAL | 2 refills | Status: DC

## 2017-01-30 NOTE — Progress Notes (Signed)
  Subjective:   Patient ID: [name], female    DOB: [birth year], 66 y.o.   MRN: [id number] CC: Follow-up (3 month) DM2 HPI: [name] is a 66 y.o. female presenting for Follow-up (3 month)  DM2: on metformin BGL fasting are 140-150  Reflux: taking meds daily, no symptoms while on meds  Was tried on losartan for microalbuminuria, caused migraine HA, does not want to be restarted Has been on zestril in the past, no problems with that medication (brand name lisinopril)  HLD: Statin medications caused muscle aches  No focal weakness, does not regularly have headaches Continues to drink 2 or more pepsis a day, on good days has just one  Relevant past medical, surgical, family and social history reviewed. Allergies and medications reviewed and updated. History  Smoking Status  . Never Smoker  Smokeless Tobacco  . Never Used   ROS: Per HPI   Objective:    BP 120/76   Pulse 70   Temp 97.2 F (36.2 C) (Oral)   Ht 5' 4" (1.626 m)   Wt 168 lb 9.6 oz (76.5 kg)   BMI 28.94 kg/m   Wt Readings from Last 3 Encounters:  01/30/17 168 lb 9.6 oz (76.5 kg)  10/29/16 170 lb 9.6 oz (77.4 kg)  10/19/16 170 lb (77.1 kg)    Gen: NAD, alert, cooperative with exam, NCAT EYES: EOMI, no conjunctival injection, or no icterus ENT:  TMs pearly gray b/l, OP without erythema CV: NRRR, normal S1/S2, no murmur Resp: CTABL, no wheezes, normal WOB Ext: No edema, warm Neuro: Alert and oriented, face symmetric, coordination grossly normal MSK: normal muscle bulk  Assessment & Plan:  Yeily was seen today for follow-up multiple med problems.  Diagnoses and all orders for this visit:  Gastroesophageal reflux disease, esophagitis presence not specified Has symptoms without ppi, cont for now -     BMP8+EGFR  Hyperlipidemia, unspecified hyperlipidemia type Trial of livalo -     Pitavastatin Calcium (LIVALO) 2 MG TABS; Take 1 tablet (2 mg total) by mouth at bedtime.  Essential  hypertension Adequate contorl, cont med -     BMP8+EGFR -     lisinopril (ZESTRIL) 2.5 MG tablet; Take 1 tablet (2.5 mg total) by mouth daily.  Type 2 diabetes mellitus without complication, without long-term current use of insulin (HCC) A1c 7.2, drinking several reg pepsis a day rec stopping all sodas Foot exam nl -     Bayer DCA Hb A1c Waived -     Microalbumin / creatinine urine ratio -     BMP8+EGFR -     metFORMIN (GLUCOPHAGE XR) 500 MG 24 hr tablet; Take 2 tablets (1,000 mg total) by mouth daily with breakfast.   Follow up plan: Return in about 3 months (around 04/29/2017). Assunta Found, MD [place]

## 2017-01-31 LAB — BMP8+EGFR
BUN / CREAT RATIO: 22 (ref 12–28)
BUN: 14 mg/dL (ref 8–27)
CALCIUM: 9.3 mg/dL (ref 8.7–10.3)
CO2: 25 mmol/L (ref 18–29)
CREATININE: 0.64 mg/dL (ref 0.57–1.00)
Chloride: 98 mmol/L (ref 96–106)
GFR calc Af Amer: 108 mL/min/{1.73_m2} (ref >59)
GFR calc non Af Amer: 94 mL/min/{1.73_m2} (ref >59)
GLUCOSE: 154 mg/dL — AB (ref 65–99)
Potassium: 4.1 mmol/L (ref 3.5–5.2)
Sodium: 142 mmol/L (ref 134–144)

## 2017-01-31 LAB — MICROALBUMIN / CREATININE URINE RATIO
Creatinine, Urine: 241.6 mg/dL
Microalb/Creat Ratio: 36 mg/g creat — ABNORMAL HIGH (ref 0.0–30.0)
Microalbumin, Urine: 87 ug/mL

## 2017-02-06 ENCOUNTER — Encounter: Payer: Self-pay | Admitting: Cardiology

## 2017-02-18 ENCOUNTER — Other Ambulatory Visit: Payer: Self-pay | Admitting: Family

## 2017-02-18 DIAGNOSIS — F419 Anxiety disorder, unspecified: Secondary | ICD-10-CM

## 2017-03-09 ENCOUNTER — Other Ambulatory Visit: Payer: Self-pay | Admitting: Pediatrics

## 2017-03-09 DIAGNOSIS — K219 Gastro-esophageal reflux disease without esophagitis: Secondary | ICD-10-CM

## 2017-04-02 ENCOUNTER — Telehealth: Payer: Self-pay

## 2017-04-02 NOTE — Telephone Encounter (Signed)
Patient is requesting that their Metformin ER be changed back to regular Metformin.  They are reporting to the pharmacist that the ER makes them sleepy.

## 2017-04-04 ENCOUNTER — Telehealth: Payer: Self-pay | Admitting: *Deleted

## 2017-04-05 MED ORDER — METFORMIN HCL 500 MG PO TABS: 500.0000 mg | ORAL_TABLET | Freq: Two times a day (BID) | ORAL | 2 refills | Status: DC

## 2017-04-05 NOTE — Telephone Encounter (Signed)
Formin changed from ER to regular BID dosing- rx sent to pharmacy

## 2017-04-05 NOTE — Telephone Encounter (Signed)
Patient aware of change

## 2017-04-15 ENCOUNTER — Other Ambulatory Visit: Payer: Self-pay | Admitting: Pediatrics

## 2017-04-16 ENCOUNTER — Ambulatory Visit (INDEPENDENT_AMBULATORY_CARE_PROVIDER_SITE_OTHER): Payer: PPO | Admitting: Family

## 2017-04-16 ENCOUNTER — Encounter: Payer: Self-pay | Admitting: Family

## 2017-04-16 VITALS — BP 136/86 | HR 80 | Temp 97.2°F | Ht 64.0 in | Wt 167.6 lb

## 2017-04-16 DIAGNOSIS — J301 Allergic rhinitis due to pollen: Secondary | ICD-10-CM | POA: Diagnosis not present

## 2017-04-16 MED ORDER — FLUTICASONE PROPIONATE 50 MCG/ACT NA SUSP: 2.0000 | Freq: Every day | NASAL | 6 refills | Status: DC

## 2017-04-16 MED ORDER — CETIRIZINE HCL 10 MG PO TABS: 10.0000 mg | ORAL_TABLET | Freq: Every day | ORAL | 11 refills | Status: DC

## 2017-04-16 NOTE — Patient Instructions (Signed)
Allergic Rhinitis Allergic rhinitis is when the mucous membranes in the nose respond to allergens. Allergens are particles in the air that cause your body to have an allergic reaction. This causes you to release allergic antibodies. Through a chain of events, these eventually cause you to release histamine into the blood stream. Although meant to protect the body, it is this release of histamine that causes your discomfort, such as frequent sneezing, congestion, and an itchy, runny nose. What are the causes? Seasonal allergic rhinitis (hay fever) is caused by pollen allergens that may come from grasses, trees, and weeds. Year-round allergic rhinitis (perennial allergic rhinitis) is caused by allergens such as house dust mites, pet dander, and mold spores. What are the signs or symptoms?  Nasal stuffiness (congestion).  Itchy, runny nose with sneezing and tearing of the eyes. How is this diagnosed? Your health care provider can help you determine the allergen or allergens that trigger your symptoms. If you and your health care provider are unable to determine the allergen, skin or blood testing may be used. Your health care provider will diagnose your condition after taking your health history and performing a physical exam. Your health care provider may assess you for other related conditions, such as asthma, pink eye, or an ear infection. How is this treated? Allergic rhinitis does not have a cure, but it can be controlled by:  Medicines that block allergy symptoms. These may include allergy shots, nasal sprays, and oral antihistamines.  Avoiding the allergen. Hay fever may often be treated with antihistamines in pill or nasal spray forms. Antihistamines block the effects of histamine. There are over-the-counter medicines that may help with nasal congestion and swelling around the eyes. Check with your health care provider before taking or giving this medicine. If avoiding the allergen or the  medicine prescribed do not work, there are many new medicines your health care provider can prescribe. Stronger medicine may be used if initial measures are ineffective. Desensitizing injections can be used if medicine and avoidance does not work. Desensitization is when a patient is given ongoing shots until the body becomes less sensitive to the allergen. Make sure you follow up with your health care provider if problems continue. Follow these instructions at home: It is not possible to completely avoid allergens, but you can reduce your symptoms by taking steps to limit your exposure to them. It helps to know exactly what you are allergic to so that you can avoid your specific triggers. Contact a health care provider if:  You have a fever.  You develop a cough that does not stop easily (persistent).  You have shortness of breath.  You start wheezing.  Symptoms interfere with normal daily activities. This information is not intended to replace advice given to you by your health care provider. Make sure you discuss any questions you have with your health care provider. Document Released: 08/28/2001 Document Revised: 08/03/2016 Document Reviewed: 08/10/2013 Elsevier Interactive Patient Education  2017 Elsevier Inc.  

## 2017-04-16 NOTE — Progress Notes (Signed)
Subjective:    Patient ID: [name], female    DOB: Feb 15, 1951, 66 y.o.   MRN: [id number]  Headache   Associated symptoms include coughing, sinus pressure and a sore throat. Pertinent negatives include no ear pain.  Cough  Associated symptoms include headaches and a sore throat. Pertinent negatives include no chills or ear pain.  Sore Throat   Associated symptoms include congestion, coughing, headaches and a hoarse voice. Pertinent negatives include no ear pain.  Sinusitis  The current episode started 1 to 4 weeks ago. The problem has been gradually worsening since onset. The fever has been present for 1 to 2 days. Her pain is at a severity of 7/10. The pain is moderate. Associated symptoms include congestion, coughing, headaches, a hoarse voice, sinus pressure, sneezing and a sore throat. Pertinent negatives include no chills or ear pain. Past treatments include lying down and acetaminophen. The treatment provided mild relief.      Review of Systems  Constitutional: Negative for chills.  HENT: Positive for congestion, hoarse voice, sinus pressure, sneezing and sore throat. Negative for ear pain.   Respiratory: Positive for cough.   Neurological: Positive for headaches.  All other systems reviewed and are negative.      Objective:   Physical Exam  Constitutional: She is oriented to person, place, and time. She appears well-developed and well-nourished. No distress.  HENT:  Head: Normocephalic and atraumatic.  Right Ear: External ear normal.  Left Ear: External ear normal.  Nose: Mucosal edema and rhinorrhea present.  Mouth/Throat: Posterior oropharyngeal erythema present.  Eyes: Pupils are equal, round, and reactive to light.  Neck: Normal range of motion. Neck supple. No thyromegaly present.  Cardiovascular: Normal rate, regular rhythm, normal heart sounds and intact distal pulses.   No murmur heard. Pulmonary/Chest: Effort normal and breath sounds normal. No  respiratory distress. She has no wheezes.  Abdominal: Soft. Bowel sounds are normal. She exhibits no distension. There is no tenderness.  Musculoskeletal: Normal range of motion. She exhibits no edema or tenderness.  Neurological: She is alert and oriented to person, place, and time. She has normal reflexes. No cranial nerve deficit.  Skin: Skin is warm and dry.  Psychiatric: She has a normal mood and affect. Her behavior is normal. Judgment and thought content normal.  Vitals reviewed.     BP 136/86   Pulse 80   Temp 97.2 F (36.2 C) (Oral)   Ht 5\' 4"  (1.626 m)   Wt 167 lb 9.6 oz (76 kg)   BMI 28.77 kg/m      Assessment & Plan:  1. Seasonal allergic rhinitis due to pollen -- Take meds as prescribed - Use a cool mist humidifier  -Use saline nose sprays frequently -Saline irrigations of the nose can be very helpful if done frequently.  * 4X daily for 1 week*  * Use of a nettie pot can be helpful with this. Follow directions with this* -Force fluids -For any cough or congestion  Use plain Mucinex- regular strength or max strength is fine   * Children- consult with Pharmacist for dosing -For fever or aces or pains- take tylenol or ibuprofen appropriate for age and weight.  * for fevers greater than 101 orally you may alternate ibuprofen and tylenol every  3 hours. -Throat lozenges if help - fluticasone (FLONASE) 50 MCG/ACT nasal spray; Place 2 sprays into both nostrils daily.  Dispense: 16 g; Refill: 6 - cetirizine (ZYRTEC) 10 MG tablet; Take 1 tablet (10 mg  total) by mouth daily.  Dispense: 30 tablet; Refill: [place], FNP

## 2017-04-22 ENCOUNTER — Ambulatory Visit (INDEPENDENT_AMBULATORY_CARE_PROVIDER_SITE_OTHER): Payer: PPO | Admitting: *Deleted

## 2017-04-22 DIAGNOSIS — R001 Bradycardia, unspecified: Secondary | ICD-10-CM

## 2017-04-23 LAB — CUP PACEART REMOTE DEVICE CHECK
Battery Impedance: 156 Ohm
Battery Remaining Longevity: 147 mo
Battery Voltage: 2.78 V
Brady Statistic AP VS Percent: 0 %
Brady Statistic AS VP Percent: 0 %
Brady Statistic AS VS Percent: 100 %
Date Time Interrogation Session: 20180507120323
Implantable Lead Implant Date: 20050914
Implantable Lead Location: 753859
Implantable Lead Location: 753860
Implantable Lead Model: 5076
Lead Channel Impedance Value: 455 Ohm
Lead Channel Impedance Value: 544 Ohm
Lead Channel Pacing Threshold Pulse Width: 0.4 ms
Lead Channel Pacing Threshold Pulse Width: 0.4 ms
Lead Channel Sensing Intrinsic Amplitude: 1.4 mV
Lead Channel Sensing Intrinsic Amplitude: 8 mV
Lead Channel Setting Pacing Amplitude: 2 V
MDC IDC LEAD IMPLANT DT: 20050914
MDC IDC MSMT LEADCHNL RA PACING THRESHOLD AMPLITUDE: 0.375 V
MDC IDC MSMT LEADCHNL RV PACING THRESHOLD AMPLITUDE: 0.75 V
MDC IDC PG IMPLANT DT: 20150605
MDC IDC SET LEADCHNL RV PACING AMPLITUDE: 2.5 V
MDC IDC SET LEADCHNL RV PACING PULSEWIDTH: 0.4 ms
MDC IDC SET LEADCHNL RV SENSING SENSITIVITY: 4 mV
MDC IDC STAT BRADY AP VP PERCENT: 0 %

## 2017-04-23 NOTE — Progress Notes (Signed)
Remote pacemaker transmission.   

## 2017-04-24 DIAGNOSIS — H40033 Anatomical narrow angle, bilateral: Secondary | ICD-10-CM | POA: Diagnosis not present

## 2017-04-24 DIAGNOSIS — E119 Type 2 diabetes mellitus without complications: Secondary | ICD-10-CM | POA: Diagnosis not present

## 2017-04-24 LAB — HM DIABETES EYE EXAM

## 2017-04-26 ENCOUNTER — Encounter: Payer: Self-pay | Admitting: Cardiology

## 2017-05-02 ENCOUNTER — Encounter: Payer: Self-pay | Admitting: Pediatrics

## 2017-05-02 ENCOUNTER — Ambulatory Visit (INDEPENDENT_AMBULATORY_CARE_PROVIDER_SITE_OTHER): Payer: PPO | Admitting: Pediatrics

## 2017-05-02 VITALS — BP 131/74 | HR 70 | Temp 97.6°F | Ht 64.0 in | Wt 168.8 lb

## 2017-05-02 DIAGNOSIS — E119 Type 2 diabetes mellitus without complications: Secondary | ICD-10-CM

## 2017-05-02 LAB — BAYER DCA HB A1C WAIVED: HB A1C (BAYER DCA - WAIVED): 7.1 % — ABNORMAL HIGH (ref <7.0)

## 2017-05-02 MED ORDER — METFORMIN HCL 500 MG PO TABS: 1000.0000 mg | ORAL_TABLET | Freq: Two times a day (BID) | ORAL | 2 refills | Status: DC

## 2017-05-02 NOTE — Progress Notes (Signed)
  Subjective:   Patient ID: [name], female    DOB: 05-29-1951, 66 y.o.   MRN: [id number] CC: Follow-up (3 month)  HPI: [name] is a 66 y.o. female presenting for Follow-up (3 month)  Sinus pressure improved from last visit Taking antihistamine alone  Dm2:  Last checked was 191 at home Decreased to 1 soda a day Continues to follow sugars at home On metformin a lone  HTN: no CP, no HA, no lightheadedness  Relevant past medical, surgical, family and social history reviewed. Allergies and medications reviewed and updated. History  Smoking Status  . Never Smoker  Smokeless Tobacco  . Never Used   ROS: Per HPI   Objective:    BP 131/74   Pulse 70   Temp 97.6 F (36.4 C) (Oral)   Ht 5\' 4"  (1.626 m)   Wt 168 lb 12.8 oz (76.6 kg)   BMI 28.97 kg/m   Wt Readings from Last 3 Encounters:  05/02/17 168 lb 12.8 oz (76.6 kg)  04/16/17 167 lb 9.6 oz (76 kg)  01/30/17 168 lb 9.6 oz (76.5 kg)    Gen: NAD, alert, cooperative with exam, NCAT EYES: EOMI, no conjunctival injection, or no icterus ENT:  TMs pearly gray b/l, OP without erythema LYMPH: no cervical LAD CV: NRRR, normal S1/S2, no murmur, distal pulses 2+ b/l Resp: CTABL, no wheezes, normal WOB Abd: +BS, soft, NTND. Ext: No edema, warm  Assessment & Plan:  [name] was seen today for follow-up Dm2.  Diagnoses and all orders for this visit:  Type 2 diabetes mellitus without complication, without long-term current use of insulin (HCC) A1c 7.1, will increase metformin to 1000mg  BID Cont to work on decreasing reg soda intake -     Bayer DCA Hb A1c Waived -     metFORMIN (GLUCOPHAGE) 500 MG tablet; Take 2 tablets (1,000 mg total) by mouth 2 (two) times daily with a meal.   Follow up plan: Return in about 4 months (around 09/02/2017). Assunta Found, MD [place]

## 2017-05-10 ENCOUNTER — Encounter: Payer: Self-pay | Admitting: Cardiology

## 2017-05-11 ENCOUNTER — Other Ambulatory Visit: Payer: Self-pay | Admitting: Family

## 2017-05-11 DIAGNOSIS — F419 Anxiety disorder, unspecified: Secondary | ICD-10-CM

## 2017-07-22 ENCOUNTER — Ambulatory Visit (INDEPENDENT_AMBULATORY_CARE_PROVIDER_SITE_OTHER): Payer: PPO | Admitting: *Deleted

## 2017-07-22 DIAGNOSIS — R001 Bradycardia, unspecified: Secondary | ICD-10-CM | POA: Diagnosis not present

## 2017-07-23 NOTE — Progress Notes (Signed)
Remote pacemaker transmission.   

## 2017-07-24 ENCOUNTER — Encounter: Payer: Self-pay | Admitting: Cardiology

## 2017-08-02 ENCOUNTER — Ambulatory Visit (INDEPENDENT_AMBULATORY_CARE_PROVIDER_SITE_OTHER): Payer: PPO | Admitting: *Deleted

## 2017-08-02 ENCOUNTER — Ambulatory Visit (INDEPENDENT_AMBULATORY_CARE_PROVIDER_SITE_OTHER): Payer: PPO

## 2017-08-02 VITALS — BP 124/76 | HR 77 | Ht 63.5 in | Wt 167.0 lb

## 2017-08-02 DIAGNOSIS — Z780 Asymptomatic menopausal state: Secondary | ICD-10-CM

## 2017-08-02 DIAGNOSIS — Z23 Encounter for immunization: Secondary | ICD-10-CM | POA: Diagnosis not present

## 2017-08-02 DIAGNOSIS — Z0000 Encounter for general adult medical examination without abnormal findings: Secondary | ICD-10-CM

## 2017-08-02 NOTE — Progress Notes (Signed)
Subjective:   [name] is a 66 y.o. female who presents for an Initial Medicare Annual Wellness Visit. [name] lives at home alone. She has one adult daughter and 2 grandchildren. She does not have any pets. She attends church services on Sundays but isn't active in any particular church or community groups. She enjoys reading and watching television. She used to enjoy coloring but that eventually caused neck pain. She does do her own yardwork and housework but does not participate in any other physical activity or exercise.   Review of Systems    Reports that her health is about the same as last year.   Cardiac Risk Factors include: advanced age (>63men, >9 women);dyslipidemia;hypertension;diabetes mellitus;sedentary lifestyle    other systems negative today.  Objective:    Today's Vitals   08/02/17 1345  BP: 124/76  Pulse: 77  Weight: 167 lb (75.8 kg)  Height: 5' 3.5" (1.613 m)   Body mass index is 29.12 kg/m.   Current Medications (verified) Outpatient Encounter Prescriptions as of 08/02/2017  Medication Sig  . calcium citrate-vitamin D (CITRACAL+D) 315-200 MG-UNIT per tablet Take 1 tablet by mouth daily.   . fluticasone (FLONASE) 50 MCG/ACT nasal spray Place 2 sprays into both nostrils daily.  Marland Kitchen glucose blood test strip Use to check BG up to qd  . metFORMIN (GLUCOPHAGE) 500 MG tablet Take 2 tablets (1,000 mg total) by mouth 2 (two) times daily with a meal.  . nebivolol (BYSTOLIC) 5 MG tablet Take 1 tablet (5 mg total) by mouth 2 (two) times daily.  Glory Rosebush DELICA LANCETS FINE MISC Use to check BG up to qd  . pantoprazole (PROTONIX) 40 MG tablet Take 1 tablet (40 mg total) by mouth daily.  . sertraline (ZOLOFT) 50 MG tablet Take 1/2 tablet (25 mg total) by mouth daily.  . [DISCONTINUED] cetirizine (ZYRTEC) 10 MG tablet Take 1 tablet (10 mg total) by mouth daily.   No facility-administered encounter medications on file as of 08/02/2017.     Allergies  (verified) Crestor [rosuvastatin] and Pravachol [pravastatin sodium]   History: Past Medical History:  Diagnosis Date  . Anxiety   . Arthritis    of the neck  . Colon polyps   . Esophageal stricture   . GERD (gastroesophageal reflux disease)    Hiatal Hernia  . History of uterine cancer   . Hyperlipidemia   . Hypertension   . Osteoporosis   . Pacemaker   . Symptomatic bradycardia    Past Surgical History:  Procedure Laterality Date  . APPENDECTOMY    . CHOLECYSTECTOMY    . LAPAROSCOPIC TOTAL HYSTERECTOMY    . LYMPH NODE DISSECTION     bilateral pelvic and right-sided periaortic  . PACEMAKER GENERATOR CHANGE N/A 05/21/2014   Procedure: PACEMAKER GENERATOR CHANGE;  Surgeon: Evans Lance, MD;  Location: [facility] CATH LAB;  Service: Cardiovascular;  Laterality: N/A;  . status post pacemaker     metronic [id number]  . TRANSTHORACIC ECHOCARDIOGRAM  08/30/04  . TUBAL LIGATION     Family History  Problem Relation Age of Onset  . Heart disease Mother   . Heart attack Mother   . Heart disease Father   . Heart attack Father   . Cancer Sister        breast  . Alcohol abuse Brother   . COPD Sister   . Healthy Daughter    Social History   Occupational History  . employed     Engineer, manufacturing systems  Social History Main Topics  . Smoking status: Never Smoker  . Smokeless tobacco: Never Used  . Alcohol use No  . Drug use: No  . Sexual activity: No    Tobacco Counseling No tobacco use  Activities of Daily Living In your present state of health, do you have any difficulty performing the following activities: 08/02/2017  Hearing? Y  Comment Family members have mentioned that she may want to ge a hearing aid.  She doesn't think she is at that point yet.  Vision? N  Difficulty concentrating or making decisions? Y  Comment "somewhat forgetful at times"  Walking or climbing stairs? Y  Comment Gets a little short of breath  Dressing or bathing? N  Doing errands, shopping? N   Preparing Food and eating ? N  Using the Toilet? N  In the past six months, have you accidently leaked urine? Y  Comment urge and stress incontinence  Do you have problems with loss of bowel control? N  Managing your Medications? N  Comment Uses a pill box  Managing your Finances? N  Housekeeping or managing your Housekeeping? N  Some recent data might be hidden    Immunizations and Health Maintenance Immunization History  Administered Date(s) Administered  . Influenza,inj,Quad PF,36+ Mos 10/29/2016  . Pneumococcal Conjugate-13 08/02/2017   Health Maintenance Due  Topic Date Due  . DEXA SCAN  10/07/2016  . PNA vac Low Risk Adult (1 of 2 - PCV13) 10/07/2016  . MAMMOGRAM  02/13/2017  . INFLUENZA VACCINE  07/17/2017    Patient Care Team: Eustaquio Maize, MD as PCP - General (Pediatrics) Evans Lance, MD as Consulting Physician (Cardiology) Harlen Labs, MD as Referring Physician (Optometry)  No hospitalizations, ER visit, or surgeries this past year.    Assessment:   This is a routine wellness examination for [name].   Hearing/Vision screen No hearing or vision deficits noted during visit. Last eye exam was a few months ago.   Dietary issues and exercise activities discussed: Current Exercise Habits: The patient does not participate in regular exercise at present   Diet:  3 meals a day. Eats some home prepared meals and some purchased. Supper is typically between 5:00 and 7:00 pm. Does report getting hungry later at night. May have a snack then. Usually eats a meat and 2 vegetables for supper.   Goals    . Exercise 150 minutes per week (moderate activity)      Depression Screen PHQ 2/9 Scores 08/02/2017 05/02/2017 04/16/2017 01/30/2017 10/29/2016 07/18/2016 01/12/2016  PHQ - 2 Score 0 0 0 0 0 0 0    Fall Risk Fall Risk  08/02/2017 05/02/2017 04/16/2017 01/30/2017 10/29/2016  Falls in the past year? No No No No No    Cognitive Function: MMSE - Mini Mental State  Exam 08/02/2017  Orientation to time 5  Orientation to Place 5  Registration 3  Attention/ Calculation 5  Recall 3  Language- name 2 objects 2  Language- repeat 1  Language- follow 3 step command 3  Language- read & follow direction 1  Write a sentence 1  Copy design 1  Total score 30    normal exam    Screening Tests Health Maintenance  Topic Date Due  . DEXA SCAN  10/07/2016  . PNA vac Low Risk Adult (1 of 2 - PCV13) 10/07/2016  . MAMMOGRAM  02/13/2017  . INFLUENZA VACCINE  07/17/2017  . PAP SMEAR  07/30/2019 (Originally 11/21/2015)  . HEMOGLOBIN A1C  11/02/2017  . FOOT EXAM  01/30/2018  . URINE MICROALBUMIN  01/30/2018  . OPHTHALMOLOGY EXAM  04/24/2018  . COLONOSCOPY  05/21/2018  . TETANUS/TDAP  10/29/2021  . Hepatitis C Screening  Completed  . HIV Screening  Completed      Plan:   prevnar given today Dexa scan done today Mammo scheduled for later this month Discuss incontinence with Dr Evette Doffing at next visit. Urology referral may be necessary. Void every 2 hours to decrease chance for incontinence Discussed Shingrix. Check with insurance about the cost of this vaccine. Colonoscopy due next  Advance Directives given. Review with family and bring a signed/notarized copy back to our office  I have personally reviewed and noted the following in the patient's chart:   . Medical and social history . Use of alcohol, tobacco or illicit drugs  . Current medications and supplements . Functional ability and status . Nutritional status . Physical activity . Advanced directives . List of other physicians . Hospitalizations, surgeries, and ER visits in previous 12 months . Vitals . Screenings to include cognitive, depression, and falls . Referrals and appointments  In addition, I have reviewed and discussed with patient certain preventive protocols, quality metrics, and best practice recommendations. A written personalized care plan for preventive services as well as  general preventive health recommendations were provided to patient.     [name], RN   08/02/2017    I have reviewed and agree with the above AWV documentation.   Assunta Found, MD [facility] 08/02/2017, 2:22 PM

## 2017-08-02 NOTE — Patient Instructions (Signed)
[name] , Thank you for taking time to come for your Medicare Wellness Visit. I appreciate your ongoing commitment to your health goals. Please review the following plan we discussed and let me know if I can assist you in the future.   These are the goals we discussed: Goals    . Exercise 150 minutes per week (moderate activity)       This is a list of the screening recommended for you and due dates:  Health Maintenance  Topic Date Due  . DEXA scan (bone density measurement)  10/07/2016  . Pneumonia vaccines (1 of 2 - PCV13) 10/07/2016  . Mammogram  02/13/2017  . Flu Shot  07/17/2017  . Pap Smear  07/30/2019*  . Hemoglobin A1C  11/02/2017  . Complete foot exam   01/30/2018  . Urine Protein Check  01/30/2018  . Eye exam for diabetics  04/24/2018  . Colon Cancer Screening  05/21/2018  . Tetanus Vaccine  10/29/2021  .  Hepatitis C: One time screening is recommended by Center for Disease Control  (CDC) for  adults born from [birth year] through 1965.   Completed  . HIV Screening  Completed  *Topic was postponed. The date shown is not the original due date.   Pneumococcal Conjugate Vaccine suspension for injection What is this medicine? PNEUMOCOCCAL VACCINE (NEU mo KOK al vak SEEN) is a vaccine used to prevent pneumococcus bacterial infections. These bacteria can cause serious infections like pneumonia, meningitis, and blood infections. This vaccine will lower your chance of getting pneumonia. If you do get pneumonia, it can make your symptoms milder and your illness shorter. This vaccine will not treat an infection and will not cause infection. This vaccine is recommended for infants and young children, adults with certain medical conditions, and adults 76 years or older. This medicine may be used for other purposes; ask your health care provider or pharmacist if you have questions. COMMON BRAND NAME(S): Prevnar, Prevnar 13 What should I tell my health care provider before I take this  medicine? They need to know if you have any of these conditions: -bleeding problems -fever -immune system problems -an unusual or allergic reaction to pneumococcal vaccine, diphtheria toxoid, other vaccines, latex, other medicines, foods, dyes, or preservatives -pregnant or trying to get pregnant -breast-feeding How should I use this medicine? This vaccine is for injection into a muscle. It is given by a health care professional. A copy of Vaccine Information Statements will be given before each vaccination. Read this sheet carefully each time. The sheet may change frequently. Talk to your pediatrician regarding the use of this medicine in children. While this drug may be prescribed for children as young as 62 weeks old for selected conditions, precautions do apply. Overdosage: If you think you have taken too much of this medicine contact a poison control center or emergency room at once. NOTE: This medicine is only for you. Do not share this medicine with others. What if I miss a dose? It is important not to miss your dose. Call your doctor or health care professional if you are unable to keep an appointment. What may interact with this medicine? -medicines for cancer chemotherapy -medicines that suppress your immune function -steroid medicines like prednisone or cortisone This list may not describe all possible interactions. Give your health care provider a list of all the medicines, herbs, non-prescription drugs, or dietary supplements you use. Also tell them if you smoke, drink alcohol, or use illegal drugs. Some items may interact  with your medicine. What should I watch for while using this medicine? Mild fever and pain should go away in 3 days or less. Report any unusual symptoms to your doctor or health care professional. What side effects may I notice from receiving this medicine? Side effects that you should report to your doctor or health care professional as soon as  possible: -allergic reactions like skin rash, itching or hives, swelling of the face, lips, or tongue -breathing problems -confused -fast or irregular heartbeat -fever over 102 degrees F -seizures -unusual bleeding or bruising -unusual muscle weakness Side effects that usually do not require medical attention (report to your doctor or health care professional if they continue or are bothersome): -aches and pains -diarrhea -fever of 102 degrees F or less -headache -irritable -loss of appetite -pain, tender at site where injected -trouble sleeping This list may not describe all possible side effects. Call your doctor for medical advice about side effects. You may report side effects to FDA at 1-800-FDA-1088. Where should I keep my medicine? This does not apply. This vaccine is given in a clinic, pharmacy, doctor's office, or other health care setting and will not be stored at home. NOTE: This sheet is a summary. It may not cover all possible information. If you have questions about this medicine, talk to your doctor, pharmacist, or health care provider.  2018 Elsevier/Gold Standard (2014-09-09 10:27:27)

## 2017-08-09 LAB — CUP PACEART REMOTE DEVICE CHECK
Brady Statistic AP VS Percent: 0 %
Brady Statistic AS VS Percent: 100 %
Implantable Lead Location: 753859
Implantable Lead Model: 5076
Implantable Lead Model: 5076
Lead Channel Impedance Value: 511 Ohm
Lead Channel Pacing Threshold Pulse Width: 0.4 ms
Lead Channel Pacing Threshold Pulse Width: 0.4 ms
Lead Channel Setting Pacing Amplitude: 2.5 V
Lead Channel Setting Pacing Pulse Width: 0.4 ms
Lead Channel Setting Sensing Sensitivity: 4 mV
MDC IDC LEAD IMPLANT DT: 20050914
MDC IDC LEAD IMPLANT DT: 20050914
MDC IDC LEAD LOCATION: 753860
MDC IDC MSMT BATTERY IMPEDANCE: 179 Ohm
MDC IDC MSMT BATTERY REMAINING LONGEVITY: 141 mo
MDC IDC MSMT BATTERY VOLTAGE: 2.78 V
MDC IDC MSMT LEADCHNL RA IMPEDANCE VALUE: 454 Ohm
MDC IDC MSMT LEADCHNL RA PACING THRESHOLD AMPLITUDE: 0.375 V
MDC IDC MSMT LEADCHNL RV PACING THRESHOLD AMPLITUDE: 0.875 V
MDC IDC PG IMPLANT DT: 20150605
MDC IDC SESS DTM: 20180806121109
MDC IDC SET LEADCHNL RA PACING AMPLITUDE: 2 V
MDC IDC STAT BRADY AP VP PERCENT: 0 %
MDC IDC STAT BRADY AS VP PERCENT: 0 %

## 2017-08-13 ENCOUNTER — Other Ambulatory Visit: Payer: Self-pay | Admitting: Pediatrics

## 2017-08-13 DIAGNOSIS — F419 Anxiety disorder, unspecified: Secondary | ICD-10-CM

## 2017-08-14 DIAGNOSIS — Z1231 Encounter for screening mammogram for malignant neoplasm of breast: Secondary | ICD-10-CM | POA: Diagnosis not present

## 2017-08-14 NOTE — Telephone Encounter (Signed)
Next OV 09/02/17

## 2017-09-02 ENCOUNTER — Ambulatory Visit (INDEPENDENT_AMBULATORY_CARE_PROVIDER_SITE_OTHER): Payer: PPO | Admitting: Pediatrics

## 2017-09-02 ENCOUNTER — Encounter: Payer: Self-pay | Admitting: Pediatrics

## 2017-09-02 VITALS — BP 135/81 | HR 69 | Temp 97.5°F | Ht 63.5 in | Wt 165.8 lb

## 2017-09-02 DIAGNOSIS — E119 Type 2 diabetes mellitus without complications: Secondary | ICD-10-CM | POA: Diagnosis not present

## 2017-09-02 DIAGNOSIS — I10 Essential (primary) hypertension: Secondary | ICD-10-CM

## 2017-09-02 DIAGNOSIS — F419 Anxiety disorder, unspecified: Secondary | ICD-10-CM

## 2017-09-02 DIAGNOSIS — B079 Viral wart, unspecified: Secondary | ICD-10-CM

## 2017-09-02 DIAGNOSIS — K219 Gastro-esophageal reflux disease without esophagitis: Secondary | ICD-10-CM | POA: Diagnosis not present

## 2017-09-02 DIAGNOSIS — L570 Actinic keratosis: Secondary | ICD-10-CM | POA: Diagnosis not present

## 2017-09-02 LAB — BAYER DCA HB A1C WAIVED: HB A1C (BAYER DCA - WAIVED): 7.2 % — ABNORMAL HIGH (ref <7.0)

## 2017-09-02 MED ORDER — SERTRALINE HCL 50 MG PO TABS: ORAL_TABLET | ORAL | 1 refills | Status: DC

## 2017-09-02 MED ORDER — PANTOPRAZOLE SODIUM 40 MG PO TBEC: 40.0000 mg | DELAYED_RELEASE_TABLET | Freq: Every day | ORAL | 1 refills | Status: DC

## 2017-09-02 MED ORDER — ONETOUCH DELICA LANCETS FINE MISC: 2 refills | Status: DC

## 2017-09-02 MED ORDER — NEBIVOLOL HCL 5 MG PO TABS: 5.0000 mg | ORAL_TABLET | Freq: Two times a day (BID) | ORAL | 11 refills | Status: DC

## 2017-09-02 MED ORDER — GLUCOSE BLOOD VI STRP: ORAL_STRIP | 2 refills | Status: DC

## 2017-09-02 MED ORDER — METFORMIN HCL 500 MG PO TABS: 1000.0000 mg | ORAL_TABLET | Freq: Two times a day (BID) | ORAL | 2 refills | Status: DC

## 2017-09-02 NOTE — Progress Notes (Signed)
Subjective:   Patient ID: [name], female    DOB: 1951/02/15, 66 y.o.   MRN: [id number] CC: Follow-up (4 month) med problems  HPI: [name] is a 66 y.o. female presenting for Follow-up (4 month)  DM2: says BGLs after lunch in 150s Doesn't check first thing in the morning On lower days of sodas has one a day, other days 2-3 a day  No SOB, CP Doesn't check BP at home much  Has a pacemaker for symptomatic bradycardia  Has some areas on her skin she is concerned about Sometimes itch No bleeding  Anxiety: takes sertraline daily  Relevant past medical, surgical, family and social history reviewed. Allergies and medications reviewed and updated. History  Smoking Status  . Never Smoker  Smokeless Tobacco  . Never Used   ROS: Per HPI   Objective:    BP 135/81   Pulse 69   Temp (!) 97.5 F (36.4 C) (Oral)   Ht 5' 3.5" (1.613 m)   Wt 165 lb 12.8 oz (75.2 kg)   BMI 28.91 kg/m   Wt Readings from Last 3 Encounters:  09/02/17 165 lb 12.8 oz (75.2 kg)  08/02/17 167 lb (75.8 kg)  05/02/17 168 lb 12.8 oz (76.6 kg)    Gen: NAD, alert, cooperative with exam, NCAT EYES: EOMI, no conjunctival injection, or no icterus ENT:  TMs pearly gray b/l, OP without erythema LYMPH: no cervical LAD CV: NRRR, normal S1/S2, no murmur, distal pulses 2+ b/l Resp: CTABL, no wheezes, normal WOB Abd: +BS, soft, NTND. no guarding or organomegaly Ext: No edema, warm Neuro: Alert and oriented MSK: normal muscle bulk Two flesh colored apprx 1 mm rough topped papules consistent with warts on anterior L and R thigh Small rough areas on skin < 1 mm of forearms b/l, Lower legs b/l, 8 total  Assessment & Plan:  [name] was seen today for follow-up.  Diagnoses and all orders for this visit:  Type 2 diabetes mellitus without complication, without long-term current use of insulin (HCC) A1c 7.2 Decreasing soda intake Cont current meds -     Bayer DCA Hb A1c Waived -     metFORMIN  (GLUCOPHAGE) 500 MG tablet; Take 2 tablets (1,000 mg total) by mouth 2 (two) times daily with a meal. -     ONETOUCH DELICA LANCETS FINE MISC; Use to check BG up to qd -     glucose blood test strip; Use to check BG up to qd  Essential hypertension Stable, cont current meds -     nebivolol (BYSTOLIC) 5 MG tablet; Take 1 tablet (5 mg total) by mouth 2 (two) times daily. -     CMP14+EGFR  Gastroesophageal reflux disease, esophagitis presence not specified Stable, cont below, has symptoms when she stops it -     pantoprazole (PROTONIX) 40 MG tablet; Take 1 tablet (40 mg total) by mouth daily.  Anxiety Stable, cont below -     sertraline (ZOLOFT) 50 MG tablet; Take 1/2 tablet (25 mg total) by mouth daily.  Viral warts, unspecified type Discussed options, will proceed with cryo therapy, salicylic acid after blister heals, repeat 4 weeks if needed  Actinic keratoses Proceed with cryo therapy after discussion of options  PROCEDURE Discussed risks, benefits and alternatives, pt agreed to proceed. Liquid nitrogen used to freeze total of 10 benign lesions, 2 warts on leg, AKs on forearm  Follow up plan: Return in about 4 months (around 01/02/2018). Assunta Found, MD [place]

## 2017-09-03 LAB — CMP14+EGFR
A/G RATIO: 1.5 (ref 1.2–2.2)
ALT: 16 IU/L (ref 0–32)
AST: 15 IU/L (ref 0–40)
Albumin: 4.3 g/dL (ref 3.6–4.8)
Alkaline Phosphatase: 82 IU/L (ref 39–117)
BILIRUBIN TOTAL: 0.3 mg/dL (ref 0.0–1.2)
BUN/Creatinine Ratio: 18 (ref 12–28)
BUN: 13 mg/dL (ref 8–27)
CALCIUM: 10 mg/dL (ref 8.7–10.3)
CHLORIDE: 98 mmol/L (ref 96–106)
CO2: 23 mmol/L (ref 20–29)
Creatinine, Ser: 0.71 mg/dL (ref 0.57–1.00)
GFR calc non Af Amer: 90 mL/min/{1.73_m2} (ref >59)
GFR, EST AFRICAN AMERICAN: 103 mL/min/{1.73_m2} (ref >59)
GLUCOSE: 154 mg/dL — AB (ref 65–99)
Globulin, Total: 2.8 g/dL (ref 1.5–4.5)
POTASSIUM: 4.1 mmol/L (ref 3.5–5.2)
Sodium: 140 mmol/L (ref 134–144)
TOTAL PROTEIN: 7.1 g/dL (ref 6.0–8.5)

## 2017-09-17 ENCOUNTER — Other Ambulatory Visit: Payer: Self-pay | Admitting: *Deleted

## 2017-09-17 DIAGNOSIS — E119 Type 2 diabetes mellitus without complications: Secondary | ICD-10-CM

## 2017-09-17 MED ORDER — ONETOUCH ULTRA 2 W/DEVICE KIT: 1.0000 | PACK | Freq: Every day | 0 refills | Status: AC

## 2017-10-21 ENCOUNTER — Ambulatory Visit (INDEPENDENT_AMBULATORY_CARE_PROVIDER_SITE_OTHER): Payer: PPO | Admitting: *Deleted

## 2017-10-21 DIAGNOSIS — R001 Bradycardia, unspecified: Secondary | ICD-10-CM | POA: Diagnosis not present

## 2017-10-22 NOTE — Progress Notes (Signed)
Remote pacemaker transmission.   

## 2017-10-23 ENCOUNTER — Encounter: Payer: Self-pay | Admitting: Cardiology

## 2017-10-24 LAB — CUP PACEART REMOTE DEVICE CHECK
Battery Impedance: 179 Ohm
Battery Remaining Longevity: 140 mo
Brady Statistic AP VP Percent: 0 %
Brady Statistic AP VS Percent: 0 %
Brady Statistic AS VP Percent: 0 %
Brady Statistic AS VS Percent: 100 %
Implantable Lead Implant Date: 20050914
Implantable Lead Location: 753860
Implantable Lead Model: 5076
Implantable Lead Model: 5076
Lead Channel Impedance Value: 468 Ohm
Lead Channel Impedance Value: 520 Ohm
Lead Channel Pacing Threshold Amplitude: 0.375 V
Lead Channel Pacing Threshold Pulse Width: 0.4 ms
Lead Channel Setting Pacing Amplitude: 2.5 V
Lead Channel Setting Sensing Sensitivity: 5.6 mV
MDC IDC LEAD IMPLANT DT: 20050914
MDC IDC LEAD LOCATION: 753859
MDC IDC MSMT BATTERY VOLTAGE: 2.78 V
MDC IDC MSMT LEADCHNL RV PACING THRESHOLD AMPLITUDE: 0.75 V
MDC IDC MSMT LEADCHNL RV PACING THRESHOLD PULSEWIDTH: 0.4 ms
MDC IDC PG IMPLANT DT: 20150605
MDC IDC SESS DTM: 20181105225435
MDC IDC SET LEADCHNL RA PACING AMPLITUDE: 2 V
MDC IDC SET LEADCHNL RV PACING PULSEWIDTH: 0.4 ms

## 2017-11-04 DIAGNOSIS — M810 Age-related osteoporosis without current pathological fracture: Secondary | ICD-10-CM | POA: Insufficient documentation

## 2017-11-04 DIAGNOSIS — R001 Bradycardia, unspecified: Secondary | ICD-10-CM | POA: Insufficient documentation

## 2017-11-15 ENCOUNTER — Encounter: Payer: Self-pay | Admitting: Internal Medicine

## 2017-11-15 ENCOUNTER — Ambulatory Visit: Payer: PPO | Admitting: Internal Medicine

## 2017-11-15 VITALS — BP 132/72 | HR 77 | Ht 63.5 in | Wt 168.4 lb

## 2017-11-15 DIAGNOSIS — Z950 Presence of cardiac pacemaker: Secondary | ICD-10-CM

## 2017-11-15 DIAGNOSIS — I495 Sick sinus syndrome: Secondary | ICD-10-CM | POA: Diagnosis not present

## 2017-11-15 DIAGNOSIS — I10 Essential (primary) hypertension: Secondary | ICD-10-CM

## 2017-11-15 MED ORDER — NEBIVOLOL HCL 5 MG PO TABS: 5.0000 mg | ORAL_TABLET | Freq: Two times a day (BID) | ORAL | 11 refills | Status: DC

## 2017-11-15 NOTE — Patient Instructions (Signed)

## 2017-11-15 NOTE — Progress Notes (Signed)
HPI [name] returns today for ongoing evaluation and management her pacemaker. She is a very pleasant 66 year old woman with a history of hypertension and diabetes, who developed symptom bradycardia over 10 years ago. She underwent pacemaker generator change out 3 years ago. In the interim she has done well with no chest pain or shortness of breath. No syncope. She remains active. No peripheral edema. She notes that her diabetes is not quite as well-controlled as her physician would like. Allergies  Allergen Reactions  . Crestor [Rosuvastatin] Other (See Comments)    Myalgias   . Pravachol [Pravastatin Sodium] Other (See Comments)    Stomach pain and constipation      Current Outpatient Medications  Medication Sig Dispense Refill  . Blood Glucose Monitoring Suppl (ONE TOUCH ULTRA 2) w/Device KIT 1 Device by Does not apply route daily. 1 each 0  . calcium citrate-vitamin D (CITRACAL+D) 315-200 MG-UNIT per tablet Take 1 tablet by mouth daily.     . fluticasone (FLONASE) 50 MCG/ACT nasal spray Place 2 sprays into both nostrils daily. 16 g 6  . glucose blood test strip Use to check BG up to qd 100 each 2  . metFORMIN (GLUCOPHAGE) 500 MG tablet Take 2 tablets (1,000 mg total) by mouth 2 (two) times daily with a meal. 120 tablet 2  . nebivolol (BYSTOLIC) 5 MG tablet Take 1 tablet (5 mg total) by mouth 2 (two) times daily. 60 tablet 11  . ONETOUCH DELICA LANCETS FINE MISC Use to check BG up to qd 100 each 2  . pantoprazole (PROTONIX) 40 MG tablet Take 1 tablet (40 mg total) by mouth daily. 90 tablet 1  . sertraline (ZOLOFT) 50 MG tablet Take 1/2 tablet (25 mg total) by mouth daily. 45 tablet 1   No current facility-administered medications for this visit.      Past Medical History:  Diagnosis Date  . Anxiety   . Arthritis    of the neck  . Colon polyps   . Esophageal stricture   . GERD (gastroesophageal reflux disease)    Hiatal Hernia  . History of uterine cancer   .  Hyperlipidemia   . Hypertension   . Osteoporosis   . Pacemaker   . Symptomatic bradycardia     ROS:   All systems reviewed and negative except as noted in the HPI.   Past Surgical History:  Procedure Laterality Date  . APPENDECTOMY    . CHOLECYSTECTOMY    . LAPAROSCOPIC TOTAL HYSTERECTOMY    . LYMPH NODE DISSECTION     bilateral pelvic and right-sided periaortic  . PACEMAKER GENERATOR CHANGE N/A 05/21/2014   Procedure: PACEMAKER GENERATOR CHANGE;  Surgeon: Evans Lance, MD;  Location: [facility] CATH LAB;  Service: Cardiovascular;  Laterality: N/A;  . status post pacemaker     metronic [id number]  . TRANSTHORACIC ECHOCARDIOGRAM  08/30/04  . TUBAL LIGATION       Family History  Problem Relation Age of Onset  . Heart disease Mother   . Heart attack Mother   . Heart disease Father   . Heart attack Father   . Cancer Sister        breast  . Alcohol abuse Brother   . COPD Sister   . Healthy Daughter      Social History   Socioeconomic History  . Marital status: Divorced    Spouse name: Not on file  . Number of children: Not on file  . Years of education:  Not on file  . Highest education level: Not on file  Social Needs  . Financial resource strain: Not on file  . Food insecurity - worry: Not on file  . Food insecurity - inability: Not on file  . Transportation needs - medical: Not on file  . Transportation needs - non-medical: Not on file  Occupational History  . Occupation: employed    Comment: Engineer, manufacturing systems  Tobacco Use  . Smoking status: Never Smoker  . Smokeless tobacco: Never Used  Substance and Sexual Activity  . Alcohol use: No  . Drug use: No  . Sexual activity: No  Other Topics Concern  . Not on file  Social History Narrative  . Not on file     BP 132/72   Pulse 77   Ht 5' 3.5" (1.613 m)   Wt 168 lb 6.4 oz (76.4 kg)   SpO2 96%   BMI 29.36 kg/m   Physical Exam:  Well appearing 66 year old woman, NAD HEENT: Unremarkable Neck:  6 cm JVD,  no thyromegally Lymphatics:  No adenopathy Back:  No CVA tenderness Lungs:  Clear, with no wheezes, rales, or rhonchi HEART:  Regular rate rhythm, no murmurs, no rubs, no clicks Abd:  soft, positive bowel sounds, no organomegally, no rebound, no guarding Ext:  2 plus pulses, no edema, no cyanosis, no clubbing Skin:  No rashes no nodules Neuro:  CN II through XII intact, motor grossly intact  DEVICE  Normal device function.  See PaceArt for details.   Assess/Plan: 1. Sinus node dysfunction- she is doing well status post pacemaker insertion. No change in medications. 2. Pacemaker - her Medtronic dual-chamber pacemaker is working normally. She has over 10 years on the battery. 3. Hypertension - her blood pressure has been well-controlled. She will maintain a low-sodium diet, increase her physical activity, and continue her medical therapy.  Cristopher Peru, M.D.

## 2017-11-29 LAB — CUP PACEART INCLINIC DEVICE CHECK
Battery Remaining Longevity: 141 mo
Brady Statistic AP VP Percent: 0 %
Brady Statistic AS VP Percent: 0 %
Implantable Lead Implant Date: 20050914
Implantable Lead Model: 5076
Implantable Pulse Generator Implant Date: 20150605
Lead Channel Pacing Threshold Amplitude: 1 V
Lead Channel Pacing Threshold Pulse Width: 0.4 ms
Lead Channel Setting Pacing Amplitude: 2 V
Lead Channel Setting Pacing Amplitude: 2.5 V
Lead Channel Setting Pacing Pulse Width: 0.4 ms
Lead Channel Setting Sensing Sensitivity: 4 mV
MDC IDC LEAD IMPLANT DT: 20050914
MDC IDC LEAD LOCATION: 753859
MDC IDC LEAD LOCATION: 753860
MDC IDC MSMT BATTERY IMPEDANCE: 179 Ohm
MDC IDC MSMT BATTERY VOLTAGE: 2.78 V
MDC IDC MSMT LEADCHNL RA IMPEDANCE VALUE: 425 Ohm
MDC IDC MSMT LEADCHNL RA PACING THRESHOLD AMPLITUDE: 0.5 V
MDC IDC MSMT LEADCHNL RA PACING THRESHOLD PULSEWIDTH: 0.4 ms
MDC IDC MSMT LEADCHNL RA SENSING INTR AMPL: 4 mV
MDC IDC MSMT LEADCHNL RV IMPEDANCE VALUE: 558 Ohm
MDC IDC MSMT LEADCHNL RV SENSING INTR AMPL: 15.67 mV
MDC IDC SESS DTM: 20181130195424
MDC IDC STAT BRADY AP VS PERCENT: 0 %
MDC IDC STAT BRADY AS VS PERCENT: 100 %

## 2018-01-03 ENCOUNTER — Ambulatory Visit (INDEPENDENT_AMBULATORY_CARE_PROVIDER_SITE_OTHER): Payer: PPO | Admitting: Pediatrics

## 2018-01-03 ENCOUNTER — Encounter: Payer: Self-pay | Admitting: Pediatrics

## 2018-01-03 VITALS — BP 132/83 | HR 70 | Temp 96.7°F | Ht 63.5 in | Wt 167.0 lb

## 2018-01-03 DIAGNOSIS — Z23 Encounter for immunization: Secondary | ICD-10-CM | POA: Diagnosis not present

## 2018-01-03 DIAGNOSIS — Z6829 Body mass index (BMI) 29.0-29.9, adult: Secondary | ICD-10-CM

## 2018-01-03 DIAGNOSIS — E119 Type 2 diabetes mellitus without complications: Secondary | ICD-10-CM

## 2018-01-03 DIAGNOSIS — E785 Hyperlipidemia, unspecified: Secondary | ICD-10-CM | POA: Diagnosis not present

## 2018-01-03 LAB — BAYER DCA HB A1C WAIVED: HB A1C (BAYER DCA - WAIVED): 7.9 % — ABNORMAL HIGH (ref <7.0)

## 2018-01-03 MED ORDER — METFORMIN HCL 500 MG PO TABS: 500.0000 mg | ORAL_TABLET | Freq: Every day | ORAL | 1 refills | Status: DC

## 2018-01-03 MED ORDER — SITAGLIPTIN PHOSPHATE 100 MG PO TABS: 100.0000 mg | ORAL_TABLET | Freq: Every day | ORAL | 2 refills | Status: DC

## 2018-01-03 NOTE — Progress Notes (Signed)
  Subjective:   Patient ID: [name], female    DOB: Oct 20, 1951, 67 y.o.   MRN: [id number] CC: Follow-up (4 month) Multiple medical problems HPI: [name] is a 67 y.o. female presenting for Follow-up (4 month)  Appetite has been good   Diabetes: Still drinking Pepsi's most days, she says some days she does better than other days at decreasing amount of  Anxiety: She says over the years she has tried to decrease her sertraline every time she does her mood goes down, she feels more irritable so she restarted it.  Symptoms well controlled as long as she takes a daily  GERD: Taking pantoprazole as needed, symptoms stable.  Has not been able to wean off.  Hyperlipidemia: Not currently on medicine due to side effects of the when she has tried.  Relevant past medical, surgical, family and social history reviewed. Allergies and medications reviewed and updated. Social History   Tobacco Use  Smoking Status Never Smoker  Smokeless Tobacco Never Used   ROS: Per HPI   Objective:    BP 132/83   Pulse 70   Temp (!) 96.7 F (35.9 C) (Oral)   Ht 5' 3.5" (1.613 m)   Wt 167 lb (75.8 kg)   BMI 29.12 kg/m   Wt Readings from Last 3 Encounters:  01/03/18 167 lb (75.8 kg)  11/15/17 168 lb 6.4 oz (76.4 kg)  09/02/17 165 lb 12.8 oz (75.2 kg)    Gen: NAD, alert, cooperative with exam, NCAT EYES: EOMI, no conjunctival injection, or no icterus ENT:  TMs pearly gray b/l, OP without erythema LYMPH: no cervical LAD CV: NRRR, normal S1/S2 Resp: CTABL, no wheezes, normal WOB Abd: +BS, soft, NTND.  Ext: No edema, warm Neuro: Alert and oriented, strength equal b/l UE and LE, coordination grossly normal MSK: normal muscle bulk  Assessment & Plan:  Lydia was seen today for follow-up multiple medical problems.  Diagnoses and all orders for this visit:  Type 2 diabetes mellitus without complication, without long-term current use of insulin (HCC) A1c 7.9.  Patient only able to tolerate  metformin once a day.  Will add Januvia. Continue to decrease soda intake. -     Bayer DCA Hb A1c Waived -     metFORMIN (GLUCOPHAGE) 500 MG tablet; Take 1 tablet (500 mg total) by mouth daily with breakfast.  Hyperlipidemia, unspecified hyperlipidemia type Patient has tried multiple statin medications including Crestor, pravastatin, [place].  All have caused side effects and she does not want to try any more right now for primary prevention.  BMI 29.0-29.9,adult Discussed regular exercise, lifestyle changes.  Need for immunization against influenza -     Flu Vaccine QUAD 36+ mos IM  Follow up plan: 3 months.  Sooner if needed. Assunta Found, MD [place]

## 2018-01-20 ENCOUNTER — Telehealth: Payer: Self-pay | Admitting: Cardiology

## 2018-01-20 ENCOUNTER — Ambulatory Visit (INDEPENDENT_AMBULATORY_CARE_PROVIDER_SITE_OTHER): Payer: PPO | Admitting: *Deleted

## 2018-01-20 DIAGNOSIS — I495 Sick sinus syndrome: Secondary | ICD-10-CM

## 2018-01-20 NOTE — Telephone Encounter (Signed)
Spoke with pt and reminded pt of remote transmission that is due today. Pt verbalized understanding.   

## 2018-01-22 ENCOUNTER — Encounter: Payer: Self-pay | Admitting: Cardiology

## 2018-01-28 NOTE — Progress Notes (Signed)
Remote pacemaker transmission.   

## 2018-01-29 ENCOUNTER — Encounter: Payer: Self-pay | Admitting: Cardiology

## 2018-02-09 LAB — CUP PACEART REMOTE DEVICE CHECK
Battery Impedance: 203 Ohm
Brady Statistic AP VP Percent: 0 %
Brady Statistic AP VS Percent: 0 %
Brady Statistic AS VS Percent: 100 %
Implantable Lead Implant Date: 20050914
Implantable Lead Location: 753860
Implantable Lead Model: 5076
Implantable Lead Model: 5076
Lead Channel Impedance Value: 455 Ohm
Lead Channel Impedance Value: 566 Ohm
Lead Channel Pacing Threshold Amplitude: 0.375 V
Lead Channel Pacing Threshold Pulse Width: 0.4 ms
Lead Channel Setting Pacing Amplitude: 2.5 V
MDC IDC LEAD IMPLANT DT: 20050914
MDC IDC LEAD LOCATION: 753859
MDC IDC MSMT BATTERY REMAINING LONGEVITY: 135 mo
MDC IDC MSMT BATTERY VOLTAGE: 2.78 V
MDC IDC MSMT LEADCHNL RV PACING THRESHOLD AMPLITUDE: 0.75 V
MDC IDC MSMT LEADCHNL RV PACING THRESHOLD PULSEWIDTH: 0.4 ms
MDC IDC PG IMPLANT DT: 20150605
MDC IDC SESS DTM: 20190207163638
MDC IDC SET LEADCHNL RA PACING AMPLITUDE: 2 V
MDC IDC SET LEADCHNL RV PACING PULSEWIDTH: 0.4 ms
MDC IDC SET LEADCHNL RV SENSING SENSITIVITY: 5.6 mV
MDC IDC STAT BRADY AS VP PERCENT: 0 %

## 2018-04-03 ENCOUNTER — Encounter: Payer: Self-pay | Admitting: Pediatrics

## 2018-04-03 ENCOUNTER — Ambulatory Visit (INDEPENDENT_AMBULATORY_CARE_PROVIDER_SITE_OTHER): Payer: PPO | Admitting: Pediatrics

## 2018-04-03 VITALS — BP 120/73 | HR 71 | Temp 97.6°F | Ht 63.5 in | Wt 167.8 lb

## 2018-04-03 DIAGNOSIS — E119 Type 2 diabetes mellitus without complications: Secondary | ICD-10-CM

## 2018-04-03 DIAGNOSIS — K219 Gastro-esophageal reflux disease without esophagitis: Secondary | ICD-10-CM | POA: Diagnosis not present

## 2018-04-03 DIAGNOSIS — F419 Anxiety disorder, unspecified: Secondary | ICD-10-CM

## 2018-04-03 MED ORDER — PANTOPRAZOLE SODIUM 40 MG PO TBEC: 40.0000 mg | DELAYED_RELEASE_TABLET | Freq: Every day | ORAL | 1 refills | Status: DC

## 2018-04-03 MED ORDER — SERTRALINE HCL 50 MG PO TABS: ORAL_TABLET | ORAL | 1 refills | Status: DC

## 2018-04-03 MED ORDER — SITAGLIPTIN PHOSPHATE 100 MG PO TABS: 100.0000 mg | ORAL_TABLET | Freq: Every day | ORAL | 1 refills | Status: DC

## 2018-04-03 NOTE — Progress Notes (Signed)
  Subjective:   Patient ID: [name], female    DOB: 10/30/1951, 67 y.o.   MRN: [id number] CC: Follow-up (3 month) Multiple medical problems HPI: [name] is a 67 y.o. female presenting for Follow-up (3 month)  Diabetes: Started on Januvia last visit.  Tolerating well.  Decreasing sugary foods when able  GERD: Some symptoms at night off and on.  Worse depending on what she eats. Often eats a snack around 8 PM.  Lies down for bed midnight to 1 AM.  Relieved with Mylanta.  Anxiety: Gets tense when she is watching certain things on television.  Taking sertraline once a day.  Her mood is been fine.  Sometimes has a force herself to get up and get dressed to to start her day.  Relevant past medical, surgical, family and social history reviewed. Allergies and medications reviewed and updated. Social History   Tobacco Use  Smoking Status Never Smoker  Smokeless Tobacco Never Used   ROS: Per HPI   Objective:    BP 120/73   Pulse 71   Temp 97.6 F (36.4 C) (Oral)   Ht 5' 3.5" (1.613 m)   Wt 167 lb 12.8 oz (76.1 kg)   BMI 29.26 kg/m   Wt Readings from Last 3 Encounters:  04/03/18 167 lb 12.8 oz (76.1 kg)  01/03/18 167 lb (75.8 kg)  11/15/17 168 lb 6.4 oz (76.4 kg)    Gen: NAD, alert, cooperative with exam, NCAT EYES: EOMI, no conjunctival injection, or no icterus ENT:   OP without erythema LYMPH: no cervical LAD CV: NRRR, normal S1/S2, no murmur, distal pulses 2+ b/l Resp: CTABL, no wheezes, normal WOB Abd: +BS, soft, NTND. no guarding or organomegaly Ext: No edema, warm Neuro: Alert and oriented, strength equal b/l UE and LE, coordination grossly normal MSK: normal muscle bulk  Assessment & Plan:  [name] was seen today for follow-up.  Diagnoses and all orders for this visit:  Type 2 diabetes mellitus without complication, without long-term current use of insulin (HCC) A1c improved, now 7.1.  Continue to decrease sugary food and beverage intake. -     Bayer DCA  Hb A1c Waived -     sitaGLIPtin (JANUVIA) 100 MG tablet; Take 1 tablet (100 mg total) by mouth daily. -     Microalbumin / creatinine urine ratio  Gastroesophageal reflux disease, esophagitis presence not specified Continue below.  Discussed foods to avoid with GERD, reflux precautions.  -     pantoprazole (PROTONIX) 40 MG tablet; Take 1 tablet (40 mg total) by mouth daily.  Anxiety Stable, continue below.  Patient open to trying to get better rest at night.  If symptoms worsening consider increase. -     sertraline (ZOLOFT) 50 MG tablet; Take 1/2 tablet (25 mg total) by mouth daily.   Follow up plan: Return in about 3 months (around 07/03/2018). Assunta Found, MD [place]

## 2018-04-04 LAB — MICROALBUMIN / CREATININE URINE RATIO
Creatinine, Urine: 263.3 mg/dL
MICROALB/CREAT RATIO: 44 mg/g{creat} — AB (ref 0.0–30.0)
Microalbumin, Urine: 115.9 ug/mL

## 2018-04-14 LAB — BAYER DCA HB A1C WAIVED: HB A1C: 7.1 % — AB (ref <7.0)

## 2018-04-23 DIAGNOSIS — E119 Type 2 diabetes mellitus without complications: Secondary | ICD-10-CM | POA: Diagnosis not present

## 2018-04-23 DIAGNOSIS — H40033 Anatomical narrow angle, bilateral: Secondary | ICD-10-CM | POA: Diagnosis not present

## 2018-04-23 LAB — HM DIABETES EYE EXAM

## 2018-04-28 ENCOUNTER — Telehealth: Payer: Self-pay | Admitting: Cardiology

## 2018-04-28 ENCOUNTER — Ambulatory Visit (INDEPENDENT_AMBULATORY_CARE_PROVIDER_SITE_OTHER): Payer: PPO | Admitting: *Deleted

## 2018-04-28 DIAGNOSIS — I495 Sick sinus syndrome: Secondary | ICD-10-CM

## 2018-04-28 NOTE — Telephone Encounter (Signed)
Spoke with pt and reminded pt of remote transmission that is due today. Pt verbalized understanding.   

## 2018-04-29 LAB — CUP PACEART REMOTE DEVICE CHECK
Brady Statistic AP VS Percent: 0 %
Brady Statistic AS VS Percent: 100 %
Date Time Interrogation Session: 20190513191539
Implantable Lead Implant Date: 20050914
Implantable Lead Location: 753859
Implantable Lead Model: 5076
Lead Channel Impedance Value: 603 Ohm
Lead Channel Pacing Threshold Amplitude: 0.375 V
Lead Channel Pacing Threshold Pulse Width: 0.4 ms
Lead Channel Sensing Intrinsic Amplitude: 11.2 mV
Lead Channel Setting Pacing Pulse Width: 0.4 ms
Lead Channel Setting Sensing Sensitivity: 5.6 mV
MDC IDC LEAD IMPLANT DT: 20050914
MDC IDC LEAD LOCATION: 753860
MDC IDC MSMT BATTERY IMPEDANCE: 203 Ohm
MDC IDC MSMT BATTERY REMAINING LONGEVITY: 136 mo
MDC IDC MSMT BATTERY VOLTAGE: 2.78 V
MDC IDC MSMT LEADCHNL RA IMPEDANCE VALUE: 449 Ohm
MDC IDC MSMT LEADCHNL RA PACING THRESHOLD PULSEWIDTH: 0.4 ms
MDC IDC MSMT LEADCHNL RA SENSING INTR AMPL: 1.4 mV
MDC IDC MSMT LEADCHNL RV PACING THRESHOLD AMPLITUDE: 0.75 V
MDC IDC PG IMPLANT DT: 20150605
MDC IDC SET LEADCHNL RA PACING AMPLITUDE: 2 V
MDC IDC SET LEADCHNL RV PACING AMPLITUDE: 2.5 V
MDC IDC STAT BRADY AP VP PERCENT: 0 %
MDC IDC STAT BRADY AS VP PERCENT: 0 %

## 2018-04-29 NOTE — Progress Notes (Signed)
Remote pacemaker transmission.   

## 2018-04-30 ENCOUNTER — Encounter: Payer: Self-pay | Admitting: Cardiology

## 2018-07-03 ENCOUNTER — Encounter: Payer: Self-pay | Admitting: Pediatrics

## 2018-07-03 ENCOUNTER — Ambulatory Visit (INDEPENDENT_AMBULATORY_CARE_PROVIDER_SITE_OTHER): Payer: PPO | Admitting: Pediatrics

## 2018-07-03 VITALS — BP 125/81 | HR 73 | Temp 98.0°F | Ht 63.5 in | Wt 168.0 lb

## 2018-07-03 DIAGNOSIS — E785 Hyperlipidemia, unspecified: Secondary | ICD-10-CM

## 2018-07-03 DIAGNOSIS — F419 Anxiety disorder, unspecified: Secondary | ICD-10-CM | POA: Diagnosis not present

## 2018-07-03 DIAGNOSIS — E119 Type 2 diabetes mellitus without complications: Secondary | ICD-10-CM | POA: Diagnosis not present

## 2018-07-03 DIAGNOSIS — K219 Gastro-esophageal reflux disease without esophagitis: Secondary | ICD-10-CM

## 2018-07-03 LAB — BAYER DCA HB A1C WAIVED: HB A1C: 7.5 % — AB (ref <7.0)

## 2018-07-03 MED ORDER — METFORMIN HCL 500 MG PO TABS: 500.0000 mg | ORAL_TABLET | Freq: Every day | ORAL | 1 refills | Status: DC

## 2018-07-03 MED ORDER — SITAGLIPTIN PHOSPHATE 100 MG PO TABS: 100.0000 mg | ORAL_TABLET | Freq: Every day | ORAL | 1 refills | Status: DC

## 2018-07-03 MED ORDER — PANTOPRAZOLE SODIUM 40 MG PO TBEC: 40.0000 mg | DELAYED_RELEASE_TABLET | Freq: Every day | ORAL | 1 refills | Status: DC

## 2018-07-03 MED ORDER — SERTRALINE HCL 50 MG PO TABS: ORAL_TABLET | ORAL | 1 refills | Status: DC

## 2018-07-03 NOTE — Progress Notes (Signed)
  Subjective:   Patient ID: [name], female    DOB: 09-12-1951, 67 y.o.   MRN: [id number] CC: Medical Management of Chronic Issues  HPI: [name] is a 67 y.o. female   Diabetes: taking meds regularly, avoiding sugar, some soda intake regularly  Anxiety: taking sertraline, symptoms controlled  Relevant past medical, surgical, family and social history reviewed. Allergies and medications reviewed and updated. Social History   Tobacco Use  Smoking Status Never Smoker  Smokeless Tobacco Never Used   ROS: Per HPI   Objective:    BP 125/81   Pulse 73   Temp 98 F (36.7 C) (Oral)   Ht 5' 3.5" (1.613 m)   Wt 168 lb (76.2 kg)   BMI 29.29 kg/m   Wt Readings from Last 3 Encounters:  07/03/18 168 lb (76.2 kg)  04/03/18 167 lb 12.8 oz (76.1 kg)  01/03/18 167 lb (75.8 kg)    Gen: NAD, alert, cooperative with exam, NCAT EYES: EOMI, no conjunctival injection, or no icterus ENT:  TMs pearly gray b/l, OP without erythema LYMPH: no cervical LAD CV: NRRR, normal S1/S2, no murmur, distal pulses 2+ b/l Resp: CTABL, no wheezes, normal WOB Abd: +BS, soft, NTND. no guarding or organomegaly Ext: No edema, warm Neuro: Alert and oriented MSK: normal muscle bulk  Assessment & Plan:  [name] was seen today for medical management of chronic issues.  Diagnoses and all orders for this visit:  Type 2 diabetes mellitus without complication, without long-term current use of insulin (HCC) A1c 7.5, cont to encourage decreasing sugary drink/food intake, cont current meds -     Bayer DCA Hb A1c Waived -     sitaGLIPtin (JANUVIA) 100 MG tablet; Take 1 tablet (100 mg total) by mouth daily. -     metFORMIN (GLUCOPHAGE) 500 MG tablet; Take 1 tablet (500 mg total) by mouth daily with breakfast.  Anxiety Stable, cont below -     sertraline (ZOLOFT) 50 MG tablet; Take 1/2 tablet (25 mg total) by mouth daily.  Gastroesophageal reflux disease, esophagitis presence not specified Stable, cont  below -     pantoprazole (PROTONIX) 40 MG tablet; Take 1 tablet (40 mg total) by mouth daily.  Hyperlipidemia, unspecified hyperlipidemia type Intolerant of statins she has tried in the past.  Follow up plan: Return in about 3 months (around 10/03/2018). Assunta Found, MD [place]

## 2018-07-28 ENCOUNTER — Ambulatory Visit (INDEPENDENT_AMBULATORY_CARE_PROVIDER_SITE_OTHER): Payer: PPO | Admitting: *Deleted

## 2018-07-28 DIAGNOSIS — I495 Sick sinus syndrome: Secondary | ICD-10-CM

## 2018-07-29 ENCOUNTER — Encounter: Payer: Self-pay | Admitting: Cardiology

## 2018-07-29 NOTE — Progress Notes (Signed)
Remote pacemaker transmission.   

## 2018-08-04 ENCOUNTER — Encounter: Payer: Self-pay | Admitting: *Deleted

## 2018-08-04 ENCOUNTER — Ambulatory Visit (INDEPENDENT_AMBULATORY_CARE_PROVIDER_SITE_OTHER): Payer: PPO | Admitting: *Deleted

## 2018-08-04 VITALS — BP 132/68 | HR 72 | Ht 63.0 in | Wt 167.0 lb

## 2018-08-04 DIAGNOSIS — Z23 Encounter for immunization: Secondary | ICD-10-CM | POA: Diagnosis not present

## 2018-08-04 DIAGNOSIS — Z0000 Encounter for general adult medical examination without abnormal findings: Secondary | ICD-10-CM | POA: Diagnosis not present

## 2018-08-04 NOTE — Patient Instructions (Addendum)
[name] , Thank you for taking time to come for your Medicare Wellness Visit. I appreciate your ongoing commitment to your health goals. Please review the following plan we discussed and let me know if I can assist you in the future.   These are the goals we discussed: Goals    . DIET - DECREASE SODA OR JUICE INTAKE    . DIET - INCREASE WATER INTAKE    . Exercise 150 minutes per week (moderate activity)       This is a list of the screening recommended for you and due dates:  Health Maintenance  Topic Date Due  . Colon Cancer Screening  05/21/2018  . Flu Shot  07/17/2018  . Pneumonia vaccines (2 of 2 - PPSV23) 08/02/2018  . Hemoglobin A1C  01/03/2019  . Complete foot exam   04/04/2019  . Urine Protein Check  04/04/2019  . Eye exam for diabetics  04/24/2019  . Mammogram  08/15/2019  . Tetanus Vaccine  10/29/2021  . DEXA scan (bone density measurement)  Completed  .  Hepatitis C: One time screening is recommended by Center for Disease Control  (CDC) for  adults born from [birth year] through 1965.   Completed    https://jc3141.ChipPacks.co.uk Pneumococcal Polysaccharide Vaccine: What You Need to Know 1. Why get vaccinated? Vaccination can protect older adults (and some children and younger adults) from pneumococcal disease. Pneumococcal disease is caused by bacteria that can spread from person to person through close contact. It can cause ear infections, and it can also lead to more serious infections of the:  Lungs (pneumonia),  Blood (bacteremia), and  Covering of the brain and spinal cord (meningitis). Meningitis can cause deafness and brain damage, and it can be fatal.  Anyone can get pneumococcal disease, but children under 28 years of age, people with certain medical conditions, adults over 33 years of age, and cigarette smokers are at the highest risk. About 18,000 older adults die each year from pneumococcal disease in the [place]. Treatment of  pneumococcal infections with penicillin and other drugs used to be more effective. But some strains of the disease have become resistant to these drugs. This makes prevention of the disease, through vaccination, even more important. 2. Pneumococcal polysaccharide vaccine (PPSV23) Pneumococcal polysaccharide vaccine (PPSV23) protects against 23 types of pneumococcal bacteria. It will not prevent all pneumococcal disease. PPSV23 is recommended for:  All adults 27 years of age and older,  Anyone 2 through 67 years of age with certain long-term health problems,  Anyone 2 through 67 years of age with a weakened immune system,  Adults 72 through 67 years of age who smoke cigarettes or have asthma.  Most people need only one dose of PPSV. A second dose is recommended for certain high-risk groups. People 21 and older should get a dose even if they have gotten one or more doses of the vaccine before they turned 65. Your healthcare provider can give you more information about these recommendations. Most healthy adults develop protection within 2 to 3 weeks of getting the shot. 3. Some people should not get this vaccine  Anyone who has had a life-threatening allergic reaction to PPSV should not get another dose.  Anyone who has a severe allergy to any component of PPSV should not receive it. Tell your provider if you have any severe allergies.  Anyone who is moderately or severely ill when the shot is scheduled may be asked to wait until they recover before getting the vaccine.  Someone with a mild illness can usually be vaccinated.  Children less than 3 years of age should not receive this vaccine.  There is no evidence that PPSV is harmful to either a pregnant woman or to her fetus. However, as a precaution, women who need the vaccine should be vaccinated before becoming pregnant, if possible. 4. Risks of a vaccine reaction With any medicine, including vaccines, there is a chance of side effects.  These are usually mild and go away on their own, but serious reactions are also possible. About half of people who get PPSV have mild side effects, such as redness or pain where the shot is given, which go away within about two days. Less than 1 out of 100 people develop a fever, muscle aches, or more severe local reactions. Problems that could happen after any vaccine:  People sometimes faint after a medical procedure, including vaccination. Sitting or lying down for about 15 minutes can help prevent fainting, and injuries caused by a fall. Tell your doctor if you feel dizzy, or have vision changes or ringing in the ears.  Some people get severe pain in the shoulder and have difficulty moving the arm where a shot was given. This happens very rarely.  Any medication can cause a severe allergic reaction. Such reactions from a vaccine are very rare, estimated at about 1 in a million doses, and would happen within a few minutes to a few hours after the vaccination. As with any medicine, there is a very remote chance of a vaccine causing a serious injury or death. The safety of vaccines is always being monitored. For more information, visit: http://www.aguilar.org/ 5. What if there is a serious reaction? What should I look for? Look for anything that concerns you, such as signs of a severe allergic reaction, very high fever, or unusual behavior. Signs of a severe allergic reaction can include hives, swelling of the face and throat, difficulty breathing, a fast heartbeat, dizziness, and weakness. These would usually start a few minutes to a few hours after the vaccination. What should I do? If you think it is a severe allergic reaction or other emergency that can't wait, call 9-1-1 or get to the nearest hospital. Otherwise, call your doctor. Afterward, the reaction should be reported to the Vaccine Adverse Event Reporting System (VAERS). Your doctor might file this report, or you can do it yourself  through the VAERS web site at www.vaers.SamedayNews.es, or by calling 786-468-7719. VAERS does not give medical advice. 6. How can I learn more?  Ask your doctor. He or she can give you the vaccine package insert or suggest other sources of information.  Call your local or state health department.  Contact the Centers for Disease Control and Prevention (CDC): ? Call 204-100-1423 (1-800-CDC-INFO) or ? Visit CDC's website at http://hunter.com/ CDC Pneumococcal Polysaccharide Vaccine VIS (04/09/14) This information is not intended to replace advice given to you by your health care provider. Make sure you discuss any questions you have with your health care provider. Document Released: 09/30/2006 Document Revised: 08/23/2016 Document Reviewed: 08/23/2016 Elsevier Interactive Patient Education  2017 [employer].

## 2018-08-04 NOTE — Progress Notes (Signed)
Subjective:  Ms Tarrant lives at home alone but her boyfriend is there often. She has one adult daughter and 2 grandchildren. She does not have any pets. She attends church services on Sundays but isn't active in any particular church or community groups. She enjoys reading and watching television. She used to enjoy coloring but that eventually caused neck pain. She does do her own yardwork and housework but does not participate in any other physical activity or exercise.   Review of Systems    Patient reports that her overall health is unchanged compared to last year.  Cardiac Risk Factors include: advanced age (>46mn, >>34women);diabetes mellitus;dyslipidemia;hypertension;sedentary lifestyle. She has a pacemaker and had two episodes of syncope before she was diagnosed. No problems now.   Musculoskeletal: Still has some neck pain pain but no worse than last year. Worse when lifting items. Has changed pillows. No xrays or evaluations. Takes Advil or Celebrex           Current Medications (verified) Outpatient Encounter Medications as of 08/04/2018  Medication Sig  . Blood Glucose Monitoring Suppl (ONE TOUCH ULTRA 2) w/Device KIT 1 Device by Does not apply route daily.  . calcium citrate-vitamin D (CITRACAL+D) 315-200 MG-UNIT per tablet Take 1 tablet by mouth daily.   . fluticasone (FLONASE) 50 MCG/ACT nasal spray Place 2 sprays into both nostrils daily.  .Marland Kitchenglucose blood test strip Use to check BG up to qd  . metFORMIN (GLUCOPHAGE) 500 MG tablet Take 1 tablet (500 mg total) by mouth daily with breakfast.  . nebivolol (BYSTOLIC) 5 MG tablet Take 1 tablet (5 mg total) by mouth 2 (two) times daily.  .Glory RosebushDELICA LANCETS FINE MISC Use to check BG up to qd  . pantoprazole (PROTONIX) 40 MG tablet Take 1 tablet (40 mg total) by mouth daily.  . sertraline (ZOLOFT) 50 MG tablet Take 1/2 tablet (25 mg total) by mouth daily.  . sitaGLIPtin (JANUVIA) 100 MG tablet Take 1 tablet (100 mg total)  by mouth daily.  . [DISCONTINUED] metoprolol succinate (TOPROL-XL) 25 MG 24 hr tablet Take 1 tablet (25 mg total) by mouth daily.   No facility-administered encounter medications on file as of 08/04/2018.     Allergies (verified) Crestor [rosuvastatin] and Pravachol [pravastatin sodium]   History: Past Medical History:  Diagnosis Date  . Anxiety   . Arthritis    of the neck  . Colon polyps   . Esophageal stricture   . GERD (gastroesophageal reflux disease)    Hiatal Hernia  . History of uterine cancer   . Hyperlipidemia   . Hypertension   . Osteoporosis   . Pacemaker   . Symptomatic bradycardia    Past Surgical History:  Procedure Laterality Date  . APPENDECTOMY    . CHOLECYSTECTOMY    . LAPAROSCOPIC TOTAL HYSTERECTOMY    . LYMPH NODE DISSECTION     bilateral pelvic and right-sided periaortic  . PACEMAKER GENERATOR CHANGE N/A 05/21/2014   Procedure: PACEMAKER GENERATOR CHANGE;  Surgeon: GEvans Lance MD;  [facility];  Service: Cardiovascular;  Laterality: N/A;  . status post pacemaker     metronic [id number]  . TRANSTHORACIC ECHOCARDIOGRAM  08/30/04  . TUBAL LIGATION     Family History  Problem Relation Age of Onset  . Heart disease Mother   . Heart attack Mother   . Heart disease Father   . Heart attack Father   . Cancer Sister        breast  .  Alcohol abuse Brother   . COPD Sister   . Healthy Daughter    Social History   Socioeconomic History  . Marital status: Divorced    Spouse name: Not on file  . Number of children: 1  . Years of education: GED  . Highest education level: GED or equivalent  Occupational History  . Occupation: retired    Comment: Engineer, manufacturing systems  Social Needs  . Financial resource strain: Not hard at all  . Food insecurity:    Worry: Never true    Inability: Never true  . Transportation needs:    Medical: No    Non-medical: No  Tobacco Use  . Smoking status: Never Smoker  . Smokeless tobacco: Never Used    Substance and Sexual Activity  . Alcohol use: No    Comment: may have a social drink rarely  . Drug use: No  . Sexual activity: Not Currently  Lifestyle  . Physical activity:    Days per week: 3 days    Minutes per session: 20 min  . Stress: Not at all  Relationships  . Social connections:    Talks on phone: More than three times a week    Gets together: More than three times a week    Attends religious service: More than 4 times per year    Active member of club or organization: Yes    Attends meetings of clubs or organizations: More than 4 times per year    Relationship status: Living with partner  Other Topics Concern  . Not on file  Social History Narrative  . Not on file    Tobacco Use No.  Clinical Intake:     Pain : 0-10 Pain Score: 4  Pain Type: Chronic pain Pain Location: Neck Pain Descriptors / Indicators: Aching Pain Onset: More than a month ago Pain Frequency: Intermittent Pain Relieving Factors: advil or celebrex Effect of Pain on Daily Activities: minimal  Pain Relieving Factors: advil or celebrex  Nutritional Status: BMI 25 -29 Overweight Diabetes: Yes  How often do you need to have someone help you when you read instructions, pamphlets, or other written materials from your doctor or pharmacy?: 2 - Rarely What is the last grade level you completed in school?: GED  Interpreter Needed?: No  Information entered by :: [name], RN   Activities of Daily Living In your present state of health, do you have any difficulty performing the following activities: 08/04/2018  Hearing? N  Comment has some hearing loss  Vision? N  Comment has yearly eye exams  Difficulty concentrating or making decisions? Y  Comment has some trouble with short term memory. Not really any worse.  Walking or climbing stairs? Y  Comment deconditioned  Dressing or bathing? N  Doing errands, shopping? N  Preparing Food and eating ? N  Using the Toilet? N  In the past  six months, have you accidently leaked urine? N  Do you have problems with loss of bowel control? N  Managing your Medications? N  Managing your Finances? N  Housekeeping or managing your Housekeeping? N  Some recent data might be hidden      Diet 3 meals. Mostly at home but does eat out some.  2 to 3 canned Pepsi a day Minimal water intake  Exercise Current Exercise Habits: Home exercise routine, Type of exercise: walking, Time (Minutes): 20, Frequency (Times/Week): 3, Weekly Exercise (Minutes/Week): 60, Intensity: Mild, Exercise limited by: None identified   Depression Screen PHQ  2/9 Scores 08/04/2018 07/03/2018 04/03/2018 01/03/2018 09/02/2017 08/02/2017 05/02/2017  PHQ - 2 Score 1 0 0 0 0 0 0     Fall Risk Fall Risk  08/04/2018 07/03/2018 04/03/2018 01/03/2018 09/02/2017  Falls in the past year? _0     Safety Is the patient's home free of loose throw rugs in walkways, pet beds, electrical cords, etc?   yes      Grab bars in the bathroom? no      Walkin shower? no      Shower Seat? no      Handrails on the stairs?   yes      Adequate lighting?   yes  Patient Care Team: Eustaquio Maize, MD as PCP - General (Pediatrics) Evans Lance, MD as Consulting Physician (Cardiology) Harlen Labs, MD as Referring Physician [facility])  Hospitalizations, surgeries, and ER visits in previous 12 months No hospitalizations, ER visits, or surgeries this past year.   Objective:    Today's Vitals   08/04/18 1128 08/04/18 1131  BP: 132/68   Pulse: 72   Weight: 167 lb (75.8 kg)   Height: _1  (1.6 m)   PainSc:  4    Body mass index is 29.58 kg/m.  Advanced Directives 08/04/2018 08/02/2017 05/21/2014  Does Patient Have a Medical Advance Directive? Yes Yes Patient does not have advance directive;Patient would like information  Type of Advance Directive Living will;Healthcare Power of [place];Living will -  Does patient want to make changes to  medical advance directive? No - Patient declined Yes (MAU/Ambulatory/Procedural Areas - Information given) -  Copy of [place] in Chart? No - copy requested No - copy requested -  Would patient like information on creating a medical advance directive? No - Patient declined - Advance directive packet given    Hearing/Vision  No hearing or vision deficits noted during visit.  Cognitive Function: MMSE - Mini Mental State Exam 08/02/2017  Orientation to time 5  Orientation to Place 5  Registration 3  Attention/ Calculation 5  Recall 3  Language- name 2 objects 2  Language- repeat 1  Language- follow 3 step command 3  Language- read & follow direction 1  Write a sentence 1  Copy design 1  Total score 30       Normal Cognitive Function Screening: Yes    Immunizations and Health Maintenance Immunization History  Administered Date(s) Administered  . Influenza,inj,Quad PF,6+ Mos 10/29/2016, 01/03/2018  . Pneumococcal Conjugate-13 08/02/2017  . Pneumococcal Polysaccharide-23 08/04/2018   Health Maintenance Due  Topic Date Due  . COLONOSCOPY  05/21/2018  . INFLUENZA VACCINE  07/17/2018   Health Maintenance  Topic Date Due  . COLONOSCOPY  05/21/2018  . INFLUENZA VACCINE  07/17/2018  . HEMOGLOBIN A1C  01/03/2019  . FOOT EXAM  04/04/2019  . URINE MICROALBUMIN  04/04/2019  . OPHTHALMOLOGY EXAM  04/24/2019  . MAMMOGRAM  08/15/2019  . TETANUS/TDAP  10/29/2021  . DEXA SCAN  Completed  . Hepatitis C Screening  Completed  . PNA vac Low Risk Adult  Completed        Assessment:   This is a routine wellness examination for [place].    Plan:    Goals    . DIET - DECREASE SODA OR JUICE INTAKE    . DIET - INCREASE WATER INTAKE    . Exercise 150 minutes per week (moderate activity)        Health Maintenance  Recommendations: Pneumococcal vaccine  Colorectal cancer screening-scheduled for 09/21/2018   Additional Screening Recommendations: Lung: Low  Dose CT Chest recommended if Age 10-80 years, 30 pack-year currently smoking OR have quit w/in 15years. Patient does not qualify. Hepatitis C Screening recommended: no  Today's Orders Orders Placed This Encounter  Procedures  . Pneumococcal polysaccharide vaccine 23-valent greater than or equal to 2yo subcutaneous/IM  -Given  Keep f/u with Eustaquio Maize, MD and any other specialty appointments you may have Continue current medications Move carefully to avoid falls. Use assistive devices like a cane or walker if needed. Recommend Tai Chi at the recreation dept. Aim for at least 150 minutes of moderate activity a week. This can be done with chair exercises if necessary. Read or work on puzzles daily Stay connected with friends and family  I have personally reviewed and noted the following in the patient's chart:   . Medical and social history . Use of alcohol, tobacco or illicit drugs  . Current medications and supplements . Functional ability and status . Nutritional status . Physical activity . Advanced directives . List of other physicians . Hospitalizations, surgeries, and ER visits in previous 12 months . Vitals . Screenings to include cognitive, depression, and falls . Referrals and appointments  In addition, I have reviewed and discussed with patient certain preventive protocols, quality metrics, and best practice recommendations. A written personalized care plan for preventive services as well as general preventive health recommendations were provided to patient.     [name], RN   08/04/2018

## 2018-08-21 LAB — CUP PACEART REMOTE DEVICE CHECK
Battery Remaining Longevity: 135 mo
Brady Statistic AP VP Percent: 0 %
Brady Statistic AP VS Percent: 0 %
Brady Statistic AS VP Percent: 0 %
Date Time Interrogation Session: 20190812164204
Implantable Lead Implant Date: 20050914
Implantable Lead Location: 753859
Implantable Pulse Generator Implant Date: 20150605
Lead Channel Impedance Value: 461 Ohm
Lead Channel Impedance Value: 554 Ohm
Lead Channel Pacing Threshold Amplitude: 0.75 V
Lead Channel Pacing Threshold Pulse Width: 0.4 ms
Lead Channel Setting Pacing Amplitude: 2 V
Lead Channel Setting Pacing Amplitude: 2.5 V
Lead Channel Setting Sensing Sensitivity: 5.6 mV
MDC IDC LEAD IMPLANT DT: 20050914
MDC IDC LEAD LOCATION: 753860
MDC IDC MSMT BATTERY IMPEDANCE: 204 Ohm
MDC IDC MSMT BATTERY VOLTAGE: 2.78 V
MDC IDC MSMT LEADCHNL RA PACING THRESHOLD AMPLITUDE: 0.375 V
MDC IDC MSMT LEADCHNL RA PACING THRESHOLD PULSEWIDTH: 0.4 ms
MDC IDC SET LEADCHNL RV PACING PULSEWIDTH: 0.4 ms
MDC IDC STAT BRADY AS VS PERCENT: 100 %

## 2018-09-22 DIAGNOSIS — Z86010 Personal history of colonic polyps: Secondary | ICD-10-CM | POA: Diagnosis not present

## 2018-09-22 DIAGNOSIS — K635 Polyp of colon: Secondary | ICD-10-CM | POA: Diagnosis not present

## 2018-09-22 DIAGNOSIS — D123 Benign neoplasm of transverse colon: Secondary | ICD-10-CM | POA: Diagnosis not present

## 2018-09-24 DIAGNOSIS — D123 Benign neoplasm of transverse colon: Secondary | ICD-10-CM | POA: Diagnosis not present

## 2018-09-24 DIAGNOSIS — K635 Polyp of colon: Secondary | ICD-10-CM | POA: Diagnosis not present

## 2018-10-08 ENCOUNTER — Ambulatory Visit (INDEPENDENT_AMBULATORY_CARE_PROVIDER_SITE_OTHER): Payer: PPO | Admitting: Pediatrics

## 2018-10-08 ENCOUNTER — Encounter: Payer: Self-pay | Admitting: Pediatrics

## 2018-10-08 VITALS — Ht 63.5 in | Wt 167.0 lb

## 2018-10-08 DIAGNOSIS — E785 Hyperlipidemia, unspecified: Secondary | ICD-10-CM | POA: Diagnosis not present

## 2018-10-08 DIAGNOSIS — F419 Anxiety disorder, unspecified: Secondary | ICD-10-CM

## 2018-10-08 DIAGNOSIS — Z23 Encounter for immunization: Secondary | ICD-10-CM | POA: Diagnosis not present

## 2018-10-08 DIAGNOSIS — I10 Essential (primary) hypertension: Secondary | ICD-10-CM

## 2018-10-08 DIAGNOSIS — E119 Type 2 diabetes mellitus without complications: Secondary | ICD-10-CM | POA: Diagnosis not present

## 2018-10-08 LAB — BAYER DCA HB A1C WAIVED: HB A1C (BAYER DCA - WAIVED): 7.3 % — ABNORMAL HIGH (ref <7.0)

## 2018-10-08 MED ORDER — SERTRALINE HCL 50 MG PO TABS: 50.0000 mg | ORAL_TABLET | Freq: Every day | ORAL | 1 refills | Status: DC

## 2018-10-08 NOTE — Progress Notes (Signed)
  Subjective:   Patient ID: JANAUTICA NETZLEY, female    DOB: October 12, 1951, 67 y.o.   MRN: [id number] CC: Follow-up (3 month) Multiple medical problems HPI: [name] is a 67 y.o. female   DM2: Ran out of Januvia about 2 weeks ago.  Price increase at the pharmacy to over $100 this month..  Previously was $45.  Cutting back on sodas, about 1 Pepsi a day.  Taking metformin regularly.  Hyperlipidemia: Has been intolerant of statins.  Anxiety: Taking sertraline 25 mg.  Has had some ongoing symptoms.  Hypertension: Taking medicine regularly.  No lightheadedness or dizziness, no chest pain or shortness of breath with exertion.  Relevant past medical, surgical, family and social history reviewed. Allergies and medications reviewed and updated. Social History   Tobacco Use  Smoking Status Never Smoker  Smokeless Tobacco Never Used   ROS: Per HPI   Objective:    Ht 5' 3.5" (1.613 m)   Wt 167 lb (75.8 kg)   BMI 29.12 kg/m   Wt Readings from Last 3 Encounters:  10/08/18 167 lb (75.8 kg)  08/04/18 167 lb (75.8 kg)  07/03/18 168 lb (76.2 kg)    Gen: NAD, alert, cooperative with exam, NCAT EYES: EOMI, no conjunctival injection, or no icterus ENT:  TMs pearly gray b/l, OP without erythema LYMPH: no cervical LAD CV: NRRR, normal S1/S2, no murmur, distal pulses 2+ b/l Resp: CTABL, no wheezes, normal WOB Abd: +BS, soft, NTND. no guarding or organomegaly Ext: No edema, warm Neuro: Alert and oriented, strength equal b/l UE and LE, coordination grossly normal MSK: normal muscle bulk  Assessment & Plan:  Dennis was seen today for follow-up multiple medical problems.  Diagnoses and all orders for this visit:  Type 2 diabetes mellitus without complication, without long-term current use of insulin (HCC) Average blood sugar 7.3.  Continue current medicines.  Has had trouble getting Januvia.  Will give samples today if we have them, can change to alternate medicine if preferred by  formulary. -     Bayer DCA Hb A1c Waived  Hyperlipidemia, unspecified hyperlipidemia type LDL most recently 109.  Has been intolerant of statins.  Continue lifestyle modifications to help control levels.  Essential hypertension Stable, continue current medicines -     BMP8+EGFR  Anxiety Some ongoing symptoms, will increase to 50 mg. -     sertraline (ZOLOFT) 50 MG tablet; Take 1 tablet (50 mg total) by mouth daily.  Need for immunization against influenza -     Flu Vaccine QUAD 36+ mos IM   Follow up plan: 3 months.  Sooner if needed. Assunta Found, MD [place]

## 2018-10-09 LAB — BMP8+EGFR
BUN/Creatinine Ratio: 21 (ref 12–28)
BUN: 16 mg/dL (ref 8–27)
CO2: 26 mmol/L (ref 20–29)
CREATININE: 0.76 mg/dL (ref 0.57–1.00)
Calcium: 9.9 mg/dL (ref 8.7–10.3)
Chloride: 97 mmol/L (ref 96–106)
GFR calc Af Amer: 94 mL/min/{1.73_m2} (ref >59)
GFR calc non Af Amer: 81 mL/min/{1.73_m2} (ref >59)
GLUCOSE: 165 mg/dL — AB (ref 65–99)
Potassium: 4.1 mmol/L (ref 3.5–5.2)
Sodium: 139 mmol/L (ref 134–144)

## 2018-10-27 ENCOUNTER — Ambulatory Visit (INDEPENDENT_AMBULATORY_CARE_PROVIDER_SITE_OTHER): Payer: PPO | Admitting: *Deleted

## 2018-10-27 DIAGNOSIS — R001 Bradycardia, unspecified: Secondary | ICD-10-CM

## 2018-10-27 DIAGNOSIS — I495 Sick sinus syndrome: Secondary | ICD-10-CM

## 2018-10-28 NOTE — Progress Notes (Signed)
Remote pacemaker transmission.   

## 2018-11-06 ENCOUNTER — Telehealth: Payer: Self-pay | Admitting: Pediatrics

## 2018-11-06 NOTE — Telephone Encounter (Signed)
Samples upfront, pt aware 

## 2018-11-06 NOTE — Telephone Encounter (Signed)
Do we have any samples of Januvia 100 mg?  Can we give her some?

## 2018-11-17 ENCOUNTER — Ambulatory Visit: Payer: PPO | Admitting: Internal Medicine

## 2018-11-17 ENCOUNTER — Encounter: Payer: Self-pay | Admitting: Internal Medicine

## 2018-11-17 VITALS — BP 128/84 | HR 71 | Ht 63.5 in | Wt 166.4 lb

## 2018-11-17 DIAGNOSIS — I10 Essential (primary) hypertension: Secondary | ICD-10-CM | POA: Diagnosis not present

## 2018-11-17 DIAGNOSIS — I495 Sick sinus syndrome: Secondary | ICD-10-CM

## 2018-11-17 DIAGNOSIS — R001 Bradycardia, unspecified: Secondary | ICD-10-CM | POA: Diagnosis not present

## 2018-11-17 DIAGNOSIS — Z950 Presence of cardiac pacemaker: Secondary | ICD-10-CM | POA: Diagnosis not present

## 2018-11-17 LAB — CUP PACEART INCLINIC DEVICE CHECK
Date Time Interrogation Session: 20191202112825
Implantable Lead Implant Date: 20050914
Implantable Lead Location: 753859
MDC IDC LEAD IMPLANT DT: 20050914
MDC IDC LEAD LOCATION: 753860
MDC IDC PG IMPLANT DT: 20150605

## 2018-11-17 NOTE — Patient Instructions (Signed)

## 2018-11-17 NOTE — Progress Notes (Signed)
HPI [name] returns today for ongoing evaluation and management her pacemaker. She is a very pleasant 67 year old woman with a history of hypertension and diabetes, who developed symptom bradycardia over 11 years ago. She underwent pacemaker generator change out 4 years ago. In the interim she has done well with no chest pain or shortness of breath. No syncope. She remains active. No peripheral edema. She notes that her diabetes is not quite as well-controlled as her physician would like. Her hgbA1C is 7.1 Allergies  Allergen Reactions  . Crestor [Rosuvastatin] Other (See Comments)    Myalgias   . Pravachol [Pravastatin Sodium] Other (See Comments)    Stomach pain and constipation      Current Outpatient Medications  Medication Sig Dispense Refill  . Blood Glucose Monitoring Suppl (ONE TOUCH ULTRA 2) w/Device KIT 1 Device by Does not apply route daily. 1 each 0  . calcium citrate-vitamin D (CITRACAL+D) 315-200 MG-UNIT per tablet Take 1 tablet by mouth daily.     . fluticasone (FLONASE) 50 MCG/ACT nasal spray Place 2 sprays into both nostrils daily. 16 g 6  . glucose blood test strip Use to check BG up to qd 100 each 2  . metFORMIN (GLUCOPHAGE) 500 MG tablet Take 1 tablet (500 mg total) by mouth daily with breakfast. 90 tablet 1  . nebivolol (BYSTOLIC) 5 MG tablet Take 1 tablet (5 mg total) by mouth 2 (two) times daily. 60 tablet 11  . ONETOUCH DELICA LANCETS FINE MISC Use to check BG up to qd 100 each 2  . pantoprazole (PROTONIX) 40 MG tablet Take 1 tablet (40 mg total) by mouth daily. 90 tablet 1  . sertraline (ZOLOFT) 50 MG tablet Take 1 tablet (50 mg total) by mouth daily. 90 tablet 1  . sitaGLIPtin (JANUVIA) 100 MG tablet Take 1 tablet (100 mg total) by mouth daily. 90 tablet 1   No current facility-administered medications for this visit.      Past Medical History:  Diagnosis Date  . Anxiety   . Arthritis    of the neck  . Colon polyps   . Esophageal stricture     . GERD (gastroesophageal reflux disease)    Hiatal Hernia  . History of uterine cancer   . Hyperlipidemia   . Hypertension   . Osteoporosis   . Pacemaker   . Symptomatic bradycardia     ROS:   All systems reviewed and negative except as noted in the HPI.   Past Surgical History:  Procedure Laterality Date  . APPENDECTOMY    . CHOLECYSTECTOMY    . LAPAROSCOPIC TOTAL HYSTERECTOMY    . LYMPH NODE DISSECTION     bilateral pelvic and right-sided periaortic  . PACEMAKER GENERATOR CHANGE N/A 05/21/2014   Procedure: PACEMAKER GENERATOR CHANGE;  Surgeon: Evans Lance, MD;  Location: [facility] CATH LAB;  Service: Cardiovascular;  Laterality: N/A;  . status post pacemaker     metronic [id number]  . TRANSTHORACIC ECHOCARDIOGRAM  08/30/04  . TUBAL LIGATION       Family History  Problem Relation Age of Onset  . Heart disease Mother   . Heart attack Mother   . Heart disease Father   . Heart attack Father   . Cancer Sister        breast  . Alcohol abuse Brother   . COPD Sister   . Healthy Daughter      Social History   Socioeconomic History  . Marital status: Divorced  Spouse name: Not on file  . Number of children: 1  . Years of education: GED  . Highest education level: GED or equivalent  Occupational History  . Occupation: retired    Comment: Engineer, manufacturing systems  Social Needs  . Financial resource strain: Not hard at all  . Food insecurity:    Worry: Never true    Inability: Never true  . Transportation needs:    Medical: No    Non-medical: No  Tobacco Use  . Smoking status: Never Smoker  . Smokeless tobacco: Never Used  Substance and Sexual Activity  . Alcohol use: No    Comment: may have a social drink rarely  . Drug use: No  . Sexual activity: Not Currently  Lifestyle  . Physical activity:    Days per week: 3 days    Minutes per session: 20 min  . Stress: Not at all  Relationships  . Social connections:    Talks on phone: More than three times a week     Gets together: More than three times a week    Attends religious service: More than 4 times per year    Active member of club or organization: Yes    Attends meetings of clubs or organizations: More than 4 times per year    Relationship status: Living with partner  . Intimate partner violence:    Fear of current or ex partner: No    Emotionally abused: No    Physically abused: No    Forced sexual activity: No  Other Topics Concern  . Not on file  Social History Narrative  . Not on file     BP 128/84   Pulse 71   Ht 5' 3.5" (1.613 m)   Wt 166 lb 6.4 oz (75.5 kg)   BMI 29.01 kg/m   Physical Exam:  Well appearing NAD HEENT: Unremarkable Neck:  No JVD, no thyromegally Lymphatics:  No adenopathy Back:  No CVA tenderness Lungs:  Clear with no wheezes HEART:  Regular rate rhythm, no murmurs, no rubs, no clicks Abd:  soft, positive bowel sounds, no organomegally, no rebound, no guarding Ext:  2 plus pulses, no edema, no cyanosis, no clubbing Skin:  No rashes no nodules Neuro:  CN II through XII intact, motor grossly intact  EKG - nsr with LVH  DEVICE  Normal device function.  See PaceArt for details.   Assess/Plan: 1. Sinus node dysfunction - she is asymptomatic, s/p PPM insertion. 2. 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 We will recheck in several months. 3. HTN - her bp is well controlled.  4. Obesity - her weight is up a bit and I have encouraged her to try and lose some weight.  Mikle Bosworth.D.

## 2018-11-27 ENCOUNTER — Other Ambulatory Visit: Payer: Self-pay | Admitting: Internal Medicine

## 2018-11-27 DIAGNOSIS — I10 Essential (primary) hypertension: Secondary | ICD-10-CM

## 2018-12-13 ENCOUNTER — Other Ambulatory Visit: Payer: Self-pay | Admitting: Pediatrics

## 2018-12-13 DIAGNOSIS — E119 Type 2 diabetes mellitus without complications: Secondary | ICD-10-CM

## 2018-12-22 ENCOUNTER — Other Ambulatory Visit: Payer: Self-pay | Admitting: Pediatrics

## 2018-12-22 DIAGNOSIS — E119 Type 2 diabetes mellitus without complications: Secondary | ICD-10-CM

## 2018-12-27 LAB — CUP PACEART REMOTE DEVICE CHECK
Battery Impedance: 227 Ohm
Battery Voltage: 2.78 V
Brady Statistic AP VS Percent: 0 %
Brady Statistic AS VP Percent: 0 %
Brady Statistic AS VS Percent: 100 %
Date Time Interrogation Session: 20191111164508
Implantable Lead Location: 753859
Implantable Lead Model: 5076
Implantable Lead Model: 5076
Lead Channel Impedance Value: 531 Ohm
Lead Channel Pacing Threshold Amplitude: 0.375 V
Lead Channel Pacing Threshold Pulse Width: 0.4 ms
Lead Channel Pacing Threshold Pulse Width: 0.4 ms
Lead Channel Setting Pacing Amplitude: 2 V
Lead Channel Setting Pacing Amplitude: 2.5 V
Lead Channel Setting Pacing Pulse Width: 0.4 ms
MDC IDC LEAD IMPLANT DT: 20050914
MDC IDC LEAD IMPLANT DT: 20050914
MDC IDC LEAD LOCATION: 753860
MDC IDC MSMT BATTERY REMAINING LONGEVITY: 131 mo
MDC IDC MSMT LEADCHNL RA IMPEDANCE VALUE: 474 Ohm
MDC IDC MSMT LEADCHNL RV PACING THRESHOLD AMPLITUDE: 0.875 V
MDC IDC PG IMPLANT DT: 20150605
MDC IDC SET LEADCHNL RV SENSING SENSITIVITY: 5.6 mV
MDC IDC STAT BRADY AP VP PERCENT: 0 %

## 2018-12-31 ENCOUNTER — Encounter: Payer: Self-pay | Admitting: Family Medicine

## 2018-12-31 ENCOUNTER — Ambulatory Visit (INDEPENDENT_AMBULATORY_CARE_PROVIDER_SITE_OTHER): Payer: PPO | Admitting: Family Medicine

## 2018-12-31 VITALS — BP 134/79 | HR 76 | Temp 97.1°F | Ht 63.5 in | Wt 165.8 lb

## 2018-12-31 DIAGNOSIS — S70362A Insect bite (nonvenomous), left thigh, initial encounter: Secondary | ICD-10-CM | POA: Diagnosis not present

## 2018-12-31 DIAGNOSIS — W57XXXA Bitten or stung by nonvenomous insect and other nonvenomous arthropods, initial encounter: Secondary | ICD-10-CM | POA: Diagnosis not present

## 2018-12-31 MED ORDER — DOXYCYCLINE HYCLATE 100 MG PO TABS: 100.0000 mg | ORAL_TABLET | Freq: Two times a day (BID) | ORAL | 0 refills | Status: DC

## 2018-12-31 NOTE — Progress Notes (Signed)
BP 134/79   Pulse 76   Temp (!) 97.1 F (36.2 C) (Oral)   Ht 5' 3.5" (1.613 m)   Wt 165 lb 12.8 oz (75.2 kg)   BMI 28.91 kg/m    Subjective:    Patient ID: [name], female    DOB: 11/20/1951, 68 y.o.   MRN: [id number]  HPI: [name] is a 68 y.o. female presenting on 12/31/2018 for Tick Removal (left thigh x 1 week. Patient states there is a red circle not around it)   HPI Tick bite left thigh Patient is coming in for a tick bite on her left thigh that she first noticed a week ago.  She thinks the tick was in her for at least a few days because she was outside raking leaves a few days before.  She says she initially noticed when she pulled out that it was sore and bruised but then that went away and then she started developing a small red circle right around it and that is what brings her in today.  She denies any joint aches or fatigue or myalgias.  She denies any rashes anywhere else.  Relevant past medical, surgical, family and social history reviewed and updated as indicated. Interim medical history since our last visit reviewed. Allergies and medications reviewed and updated.  Review of Systems  Constitutional: Negative for chills and fever.  Eyes: Negative for visual disturbance.  Respiratory: Negative for chest tightness and shortness of breath.   Cardiovascular: Negative for chest pain and leg swelling.  Musculoskeletal: Negative for arthralgias, back pain and gait problem.  Skin: Positive for rash. Negative for color change.  Neurological: Negative for light-headedness and headaches.  Psychiatric/Behavioral: Negative for agitation and behavioral problems.  All other systems reviewed and are negative.   Per HPI unless specifically indicated above   Allergies as of 12/31/2018      Reactions   Crestor [rosuvastatin] Other (See Comments)   Myalgias   Pravachol [pravastatin Sodium] Other (See Comments)   Stomach pain and constipation      Medication List         Accurate as of December 31, 2018  1:09 PM. Always use your most recent med list.        BYSTOLIC 5 MG tablet Generic drug:  nebivolol TAKE  (1)  TABLET TWICE A DAY.   calcium citrate-vitamin D 315-200 MG-UNIT tablet Commonly known as:  CITRACAL+D Take 1 tablet by mouth daily.   doxycycline 100 MG tablet Commonly known as:  VIBRA-TABS Take 1 tablet (100 mg total) by mouth 2 (two) times daily. 1 po bid   fluticasone 50 MCG/ACT nasal spray Commonly known as:  FLONASE Place 2 sprays into both nostrils daily.   glucose blood test strip Use to check BG up to qd   metFORMIN 500 MG tablet Commonly known as:  GLUCOPHAGE Take 1 tablet (500 mg total) by mouth daily with breakfast.   ONE TOUCH ULTRA 2 w/Device Kit 1 Device by Does not apply route daily.   ONETOUCH DELICA LANCETS FINE Misc Use to check BG up to qd   pantoprazole 40 MG tablet Commonly known as:  PROTONIX Take 1 tablet (40 mg total) by mouth daily.   sertraline 50 MG tablet Commonly known as:  ZOLOFT Take 1 tablet (50 mg total) by mouth daily.   sitaGLIPtin 100 MG tablet Commonly known as:  JANUVIA Take 1 tablet (100 mg total) by mouth daily.  Objective:    BP 134/79   Pulse 76   Temp (!) 97.1 F (36.2 C) (Oral)   Ht 5' 3.5" (1.613 m)   Wt 165 lb 12.8 oz (75.2 kg)   BMI 28.91 kg/m   Wt Readings from Last 3 Encounters:  12/31/18 165 lb 12.8 oz (75.2 kg)  11/17/18 166 lb 6.4 oz (75.5 kg)  10/08/18 167 lb (75.8 kg)    Physical Exam Vitals signs and nursing note reviewed.  Constitutional:      General: She is not in acute distress.    Appearance: She is well-developed. She is not diaphoretic.  Eyes:     Conjunctiva/sclera: Conjunctivae normal.  Musculoskeletal: Normal range of motion.        General: No tenderness.  Skin:    General: Skin is warm and dry.     Findings: Rash (Small pencil eraser sized area that is slightly hypopigmented with a small ring of erythema around it.   Slightly tender to palpation, no induration or fluctuation) present.  Neurological:     Mental Status: She is alert and oriented to person, place, and time.     Coordination: Coordination normal.  Psychiatric:        Behavior: Behavior normal.         Assessment & Plan:   Problem List Items Addressed This Visit    None    Visit Diagnoses    Tick bite, initial encounter    -  Primary   Relevant Medications   doxycycline (VIBRA-TABS) 100 MG tablet      Will treat with doxycycline for possible cellulitis versus early Lyme disease, return if worsens or does not improve. Follow up plan: Return if symptoms worsen or fail to improve.  Counseling provided for all of the vaccine components No orders of the defined types were placed in this encounter.   Caryl Pina, MD [facility] 12/31/2018, 1:09 PM

## 2019-01-09 ENCOUNTER — Ambulatory Visit: Payer: PPO | Admitting: Pediatrics

## 2019-01-13 ENCOUNTER — Ambulatory Visit (INDEPENDENT_AMBULATORY_CARE_PROVIDER_SITE_OTHER): Payer: PPO | Admitting: Family Medicine

## 2019-01-13 VITALS — BP 133/77 | HR 76 | Temp 97.9°F | Ht 63.5 in | Wt 167.0 lb

## 2019-01-13 DIAGNOSIS — K219 Gastro-esophageal reflux disease without esophagitis: Secondary | ICD-10-CM | POA: Diagnosis not present

## 2019-01-13 DIAGNOSIS — F419 Anxiety disorder, unspecified: Secondary | ICD-10-CM

## 2019-01-13 DIAGNOSIS — E119 Type 2 diabetes mellitus without complications: Secondary | ICD-10-CM

## 2019-01-13 DIAGNOSIS — E1159 Type 2 diabetes mellitus with other circulatory complications: Secondary | ICD-10-CM | POA: Diagnosis not present

## 2019-01-13 DIAGNOSIS — E1169 Type 2 diabetes mellitus with other specified complication: Secondary | ICD-10-CM | POA: Diagnosis not present

## 2019-01-13 DIAGNOSIS — E785 Hyperlipidemia, unspecified: Secondary | ICD-10-CM

## 2019-01-13 DIAGNOSIS — I10 Essential (primary) hypertension: Secondary | ICD-10-CM | POA: Diagnosis not present

## 2019-01-13 LAB — BAYER DCA HB A1C WAIVED: HB A1C (BAYER DCA - WAIVED): 7.3 % — ABNORMAL HIGH (ref <7.0)

## 2019-01-13 MED ORDER — SITAGLIPTIN PHOSPHATE 100 MG PO TABS: 100.0000 mg | ORAL_TABLET | Freq: Every day | ORAL | 1 refills | Status: DC

## 2019-01-13 MED ORDER — METFORMIN HCL 500 MG PO TABS: 750.0000 mg | ORAL_TABLET | Freq: Every day | ORAL | 1 refills | Status: DC

## 2019-01-13 MED ORDER — PANTOPRAZOLE SODIUM 40 MG PO TBEC: 40.0000 mg | DELAYED_RELEASE_TABLET | Freq: Every day | ORAL | 1 refills | Status: DC

## 2019-01-13 MED ORDER — SERTRALINE HCL 50 MG PO TABS: 50.0000 mg | ORAL_TABLET | Freq: Every day | ORAL | 1 refills | Status: DC

## 2019-01-13 NOTE — Progress Notes (Signed)
Subjective: CC: DM PCP: Janora Norlander, DO [name] is a 68 y.o. female presenting to clinic today for:  1. Type 2 Diabetes w/ HTN and HLD:  Patient reports that she does not monitor sugars regularly but tries to monitor at least once per week.  Blood sugars tend to be around 200.  She is taking medication(s): Januvia 100 mg daily and metformin 500 mg daily.  She tolerates these doses without difficulty but states that when metformin has been adjusted in the past that she has not been able to tolerate it secondary to GI side effects and "feeling funny in her head".  She does report consumption of Pepsi's every day 1 to 2 cans/day.  Last eye exam: UTD, 04/2018 w/ Dr Marin Comment Last foot exam: 03/2018 Last A1c:  Lab Results  Component Value Date   HGBA1C 7.3 (H) 10/08/2018  Nephropathy screen indicated?: 03/2018 Last flu, zoster and/or pneumovax: UTD  ROS: denies fever, chills, dizziness, LOC, polyuria, polydipsia, unintended weight loss/gain, foot ulcerations, numbness or tingling in extremities or chest pain.  2. GAD Longstanding history of anxiety disorder.  She reports good control with Zoloft 50 mg daily.  No SI, HI.  3.  Acid reflux Patient reports longstanding history of GERD that seems to be fairly well controlled with Protonix 40 mg daily.  She does have occasional bleeding hemorrhoids and is followed by gastroenterology with this.  She reports being up-to-date on colonoscopy, which will need to be rechecked in 5 years.  ROS: Per HPI  Allergies  Allergen Reactions  . Crestor [Rosuvastatin] Other (See Comments)    Myalgias   . Pravachol [Pravastatin Sodium] Other (See Comments)    Stomach pain and constipation    Past Medical History:  Diagnosis Date  . Anxiety   . Arthritis    of the neck  . Colon polyps   . Esophageal stricture   . GERD (gastroesophageal reflux disease)    Hiatal Hernia  . History of uterine cancer   . Hyperlipidemia   . Hypertension   .  Osteoporosis   . Pacemaker   . Symptomatic bradycardia     Current Outpatient Medications:  .  Blood Glucose Monitoring Suppl (ONE TOUCH ULTRA 2) w/Device KIT, 1 Device by Does not apply route daily., Disp: 1 each, Rfl: 0 .  BYSTOLIC 5 MG tablet, TAKE  (1)  TABLET TWICE A DAY., Disp: 60 tablet, Rfl: 11 .  calcium citrate-vitamin D (CITRACAL+D) 315-200 MG-UNIT per tablet, Take 1 tablet by mouth daily. , Disp: , Rfl:  .  doxycycline (VIBRA-TABS) 100 MG tablet, Take 1 tablet (100 mg total) by mouth 2 (two) times daily. 1 po bid, Disp: 28 tablet, Rfl: 0 .  fluticasone (FLONASE) 50 MCG/ACT nasal spray, Place 2 sprays into both nostrils daily., Disp: 16 g, Rfl: 6 .  glucose blood test strip, Use to check BG up to qd, Disp: 100 each, Rfl: 2 .  metFORMIN (GLUCOPHAGE) 500 MG tablet, Take 1 tablet (500 mg total) by mouth daily with breakfast., Disp: 90 tablet, Rfl: 0 .  ONETOUCH DELICA LANCETS FINE MISC, Use to check BG up to qd, Disp: 100 each, Rfl: 2 .  pantoprazole (PROTONIX) 40 MG tablet, Take 1 tablet (40 mg total) by mouth daily., Disp: 90 tablet, Rfl: 1 .  sertraline (ZOLOFT) 50 MG tablet, Take 1 tablet (50 mg total) by mouth daily., Disp: 90 tablet, Rfl: 1 .  sitaGLIPtin (JANUVIA) 100 MG tablet, Take 1 tablet (100 mg  total) by mouth daily., Disp: 90 tablet, Rfl: 1 Social History   Socioeconomic History  . Marital status: Divorced    Spouse name: Not on file  . Number of children: 1  . Years of education: GED  . Highest education level: GED or equivalent  Occupational History  . Occupation: retired    Comment: Engineer, manufacturing systems  Social Needs  . Financial resource strain: Not hard at all  . Food insecurity:    Worry: Never true    Inability: Never true  . Transportation needs:    Medical: No    Non-medical: No  Tobacco Use  . Smoking status: Never Smoker  . Smokeless tobacco: Never Used  Substance and Sexual Activity  . Alcohol use: No    Comment: may have a social drink rarely  .  Drug use: No  . Sexual activity: Not Currently  Lifestyle  . Physical activity:    Days per week: 3 days    Minutes per session: 20 min  . Stress: Not at all  Relationships  . Social connections:    Talks on phone: More than three times a week    Gets together: More than three times a week    Attends religious service: More than 4 times per year    Active member of club or organization: Yes    Attends meetings of clubs or organizations: More than 4 times per year    Relationship status: Living with partner  . Intimate partner violence:    Fear of current or ex partner: No    Emotionally abused: No    Physically abused: No    Forced sexual activity: No  Other Topics Concern  . Not on file  Social History Narrative  . Not on file   Family History  Problem Relation Age of Onset  . Heart disease Mother   . Heart attack Mother   . Heart disease Father   . Heart attack Father   . Cancer Sister        breast  . Alcohol abuse Brother   . COPD Sister   . Healthy Daughter     Objective: Office vital signs reviewed. BP 133/77   Pulse 76   Temp 97.9 F (36.6 C) (Oral)   Ht 5' 3.5" (1.613 m)   Wt 167 lb (75.8 kg)   BMI 29.12 kg/m   Physical Examination:  General: Awake, alert, well nourished, No acute distress HEENT: Normal    Eyes: Extraocular membranes intact, sclera white Cardio: regular rate and rhythm, S1S2 heard, no murmurs appreciated Pulm: clear to auscultation bilaterally, no wheezes, rhonchi or rales; normal work of breathing on room air Extremities: warm, well perfused, No edema, cyanosis or clubbing; +2 pulses bilaterally MSK: normal gait and station Psych: Mood stable, speech normal, affect appropriate, pleasant interactive Depression screen [facility] 2/9 01/13/2019 10/08/2018 08/04/2018  Decreased Interest 0 1 1  Down, Depressed, Hopeless 0 0 0  PHQ - 2 Score 0 1 1  Altered sleeping 0 - -  Tired, decreased energy 0 - -  Change in appetite 0 - -  Feeling bad  or failure about yourself  0 - -  Trouble concentrating 0 - -  Moving slowly or fidgety/restless 0 - -  Suicidal thoughts 0 - -  PHQ-9 Score 0 - -  Difficult doing work/chores Not difficult at all - -   GAD 7 : Generalized Anxiety Score 01/13/2019  Nervous, Anxious, on Edge 0  Control/stop worrying 0  Worry too much - different things 0  Trouble relaxing 0  Restless 0  Easily annoyed or irritable 1  Afraid - awful might happen 0  Total GAD 7 Score 1  Anxiety Difficulty Not difficult at all     Assessment/ Plan: 68 y.o. female   1. Type 2 diabetes mellitus without complication, without long-term current use of insulin (HCC) Uncontrolled.  A1c today was 7.3.  This is stable from previous A1c.  We had a discussion about alternatives to Pepsi versus reduction of Pepsi.  We will try increasing the metformin to 750 mg daily.  Continue Januvia at 100 mg daily.  We discussed that if she is unable to tolerate this medication increase that we will need to add an additional medication.  She voiced good understanding and will work on lifestyle modifications as well.  Plan for recheck in 3 months.  Will be due for urine micro, eye exam and foot exam at that visit. - Bayer DCA Hb A1c Waived - CMP14+EGFR  2. Hypertension associated with diabetes ([place]) Under good control with current regimen.  No changes made.  Refill sent not currently on any medications for this.- CMP14+EGFR - TSH  3. Hyperlipidemia associated with type 2 diabetes mellitus ([place])  She is overdue for fasting lipid panel.  Plan to check this at our next visit.  Will likely need to start statin, particularly given uncontrolled diabetes. - TSH  4. Anxiety - TSH  5. Gastroesophageal reflux disease without esophagitis Refill of PPI sent.   Orders Placed This Encounter  Procedures  . Bayer DCA Hb A1c Waived  . CMP14+EGFR  . TSH   Meds ordered this encounter  Medications  . metFORMIN (GLUCOPHAGE) 500 MG tablet    Sig:  Take 1.5 tablets (750 mg total) by mouth daily with breakfast.    Dispense:  135 tablet    Refill:  1  . pantoprazole (PROTONIX) 40 MG tablet    Sig: Take 1 tablet (40 mg total) by mouth daily.    Dispense:  90 tablet    Refill:  1  . sertraline (ZOLOFT) 50 MG tablet    Sig: Take 1 tablet (50 mg total) by mouth daily.    Dispense:  90 tablet    Refill:  1  . sitaGLIPtin (JANUVIA) 100 MG tablet    Sig: Take 1 tablet (100 mg total) by mouth daily.    Dispense:  90 tablet    Refill:  [place], [place] [phone]

## 2019-01-13 NOTE — Patient Instructions (Signed)
Your a1c was 7.3 today.  Your goal is less than 7.  I recommend that you increase the Metformin to 1.5 tablets daily.  This can be taken as a single dose or separated into morning and afternoon.  Continue Januvia.  We will recheck in 3 months.  If it is still elevated or you can not tolerate increased dose of Metformin, we will add an additional medication.  We discussed decreasing your Pepsi intake.  Try diet pepsi for a while and see if this helps curb your pepsi need.  Alternatively, you could drink fizzy water (which would not affect your sugar).   Diabetes Mellitus and Nutrition, Adult When you have diabetes (diabetes mellitus), it is very important to have healthy eating habits because your blood sugar (glucose) levels are greatly affected by what you eat and drink. Eating healthy foods in the appropriate amounts, at about the same times every day, can help you:  Control your blood glucose.  Lower your risk of heart disease.  Improve your blood pressure.  Reach or maintain a healthy weight. Every person with diabetes is different, and each person has different needs for a meal plan. Your health care provider may recommend that you work with a diet and nutrition specialist (dietitian) to make a meal plan that is best for you. Your meal plan may vary depending on factors such as:  The calories you need.  The medicines you take.  Your weight.  Your blood glucose, blood pressure, and cholesterol levels.  Your activity level.  Other health conditions you have, such as heart or kidney disease. How do carbohydrates affect me? Carbohydrates, also called carbs, affect your blood glucose level more than any other type of food. Eating carbs naturally raises the amount of glucose in your blood. Carb counting is a method for keeping track of how many carbs you eat. Counting carbs is important to keep your blood glucose at a healthy level, especially if you use insulin or take certain oral  diabetes medicines. It is important to know how many carbs you can safely have in each meal. This is different for every person. Your dietitian can help you calculate how many carbs you should have at each meal and for each snack. Foods that contain carbs include:  Bread, cereal, rice, pasta, and crackers.  Potatoes and corn.  Peas, beans, and lentils.  Milk and yogurt.  Fruit and juice.  Desserts, such as cakes, cookies, ice cream, and candy. How does alcohol affect me? Alcohol can cause a sudden decrease in blood glucose (hypoglycemia), especially if you use insulin or take certain oral diabetes medicines. Hypoglycemia can be a life-threatening condition. Symptoms of hypoglycemia (sleepiness, dizziness, and confusion) are similar to symptoms of having too much alcohol. If your health care provider says that alcohol is safe for you, follow these guidelines:  Limit alcohol intake to no more than 1 drink per day for nonpregnant women and 2 drinks per day for men. One drink equals 12 oz of beer, 5 oz of wine, or 1 oz of hard liquor.  Do not drink on an empty stomach.  Keep yourself hydrated with water, diet soda, or unsweetened iced tea.  Keep in mind that regular soda, juice, and other mixers may contain a lot of sugar and must be counted as carbs. What are tips for following this plan?  Reading food labels  Start by checking the serving size on the "Nutrition Facts" label of packaged foods and drinks. The amount of  calories, carbs, fats, and other nutrients listed on the label is based on one serving of the item. Many items contain more than one serving per package.  Check the total grams (g) of carbs in one serving. You can calculate the number of servings of carbs in one serving by dividing the total carbs by 15. For example, if a food has 30 g of total carbs, it would be equal to 2 servings of carbs.  Check the number of grams (g) of saturated and trans fats in one serving.  Choose foods that have low or no amount of these fats.  Check the number of milligrams (mg) of salt (sodium) in one serving. Most people should limit total sodium intake to less than 2,300 mg per day.  Always check the nutrition information of foods labeled as "low-fat" or "nonfat". These foods may be higher in added sugar or refined carbs and should be avoided.  Talk to your dietitian to identify your daily goals for nutrients listed on the label. Shopping  Avoid buying canned, premade, or processed foods. These foods tend to be high in fat, sodium, and added sugar.  Shop around the outside edge of the grocery store. This includes fresh fruits and vegetables, bulk grains, fresh meats, and fresh dairy. Cooking  Use low-heat cooking methods, such as baking, instead of high-heat cooking methods like deep frying.  Cook using healthy oils, such as olive, canola, or sunflower oil.  Avoid cooking with butter, cream, or high-fat meats. Meal planning  Eat meals and snacks regularly, preferably at the same times every day. Avoid going long periods of time without eating.  Eat foods high in fiber, such as fresh fruits, vegetables, beans, and whole grains. Talk to your dietitian about how many servings of carbs you can eat at each meal.  Eat 4-6 ounces (oz) of lean protein each day, such as lean meat, chicken, fish, eggs, or tofu. One oz of lean protein is equal to: ? 1 oz of meat, chicken, or fish. ? 1 egg. ?  cup of tofu.  Eat some foods each day that contain healthy fats, such as avocado, nuts, seeds, and fish. Lifestyle  Check your blood glucose regularly.  Exercise regularly as told by your health care provider. This may include: ? 150 minutes of moderate-intensity or vigorous-intensity exercise each week. This could be brisk walking, biking, or water aerobics. ? Stretching and doing strength exercises, such as yoga or weightlifting, at least 2 times a week.  Take medicines as told  by your health care provider.  Do not use any products that contain nicotine or tobacco, such as cigarettes and e-cigarettes. If you need help quitting, ask your health care provider.  Work with a Social worker or diabetes educator to identify strategies to manage stress and any emotional and social challenges. Questions to ask a health care provider  Do I need to meet with a diabetes educator?  Do I need to meet with a dietitian?  What number can I call if I have questions?  When are the best times to check my blood glucose? Where to find more information:  American Diabetes Association: diabetes.org  Academy of Nutrition and Dietetics: www.eatright.[employer] of Diabetes and Digestive and Kidney Diseases (NIH): [employer] Summary  A healthy meal plan will help you control your blood glucose and maintain a healthy lifestyle.  Working with a diet and nutrition specialist (dietitian) can help you make a meal plan that is best for you.  Keep in mind that carbohydrates (carbs) and alcohol have immediate effects on your blood glucose levels. It is important to count carbs and to use alcohol carefully. This information is not intended to replace advice given to you by your health care provider. Make sure you discuss any questions you have with your health care provider. Document Released: 08/30/2005 Document Revised: 07/03/2017 Document Reviewed: 01/07/2017 Elsevier Interactive Patient Education  2019 [employer].

## 2019-01-14 LAB — CMP14+EGFR
A/G RATIO: 1.6 (ref 1.2–2.2)
ALBUMIN: 4.1 g/dL (ref 3.8–4.8)
ALT: 19 IU/L (ref 0–32)
AST: 18 IU/L (ref 0–40)
Alkaline Phosphatase: 86 IU/L (ref 39–117)
BUN/Creatinine Ratio: 16 (ref 12–28)
BUN: 12 mg/dL (ref 8–27)
Bilirubin Total: <0.2 mg/dL (ref 0.0–1.2)
CO2: 25 mmol/L (ref 20–29)
Calcium: 9.2 mg/dL (ref 8.7–10.3)
Chloride: 101 mmol/L (ref 96–106)
Creatinine, Ser: 0.75 mg/dL (ref 0.57–1.00)
GFR calc Af Amer: 95 mL/min/{1.73_m2} (ref >59)
GFR calc non Af Amer: 83 mL/min/{1.73_m2} (ref >59)
GLOBULIN, TOTAL: 2.6 g/dL (ref 1.5–4.5)
GLUCOSE: 162 mg/dL — AB (ref 65–99)
POTASSIUM: 4.3 mmol/L (ref 3.5–5.2)
SODIUM: 142 mmol/L (ref 134–144)
Total Protein: 6.7 g/dL (ref 6.0–8.5)

## 2019-01-14 LAB — TSH: TSH: 0.881 u[IU]/mL (ref 0.450–4.500)

## 2019-02-13 ENCOUNTER — Encounter: Payer: Self-pay | Admitting: Family Medicine

## 2019-02-13 ENCOUNTER — Other Ambulatory Visit: Payer: Self-pay | Admitting: Family Medicine

## 2019-02-13 ENCOUNTER — Ambulatory Visit (HOSPITAL_COMMUNITY)
Admission: RE | Admit: 2019-02-13 | Discharge: 2019-02-13 | Disposition: A | Discharge status: Home / Self Care | Payer: PPO | Source: Ambulatory Visit | Attending: Family Medicine | Admitting: Family Medicine

## 2019-02-13 ENCOUNTER — Other Ambulatory Visit: Payer: Self-pay

## 2019-02-13 ENCOUNTER — Telehealth: Payer: Self-pay | Admitting: Family Medicine

## 2019-02-13 ENCOUNTER — Encounter: Payer: Self-pay | Admitting: Cardiology

## 2019-02-13 ENCOUNTER — Ambulatory Visit (INDEPENDENT_AMBULATORY_CARE_PROVIDER_SITE_OTHER): Payer: PPO | Admitting: Family Medicine

## 2019-02-13 ENCOUNTER — Encounter (HOSPITAL_COMMUNITY): Payer: Self-pay | Admitting: Emergency Medicine

## 2019-02-13 ENCOUNTER — Inpatient Hospital Stay (HOSPITAL_COMMUNITY)
Admission: EM | Admit: 2019-02-13 | Discharge: 2019-02-15 | DRG: 066 | Disposition: A | Discharge status: Home / Self Care | Payer: PPO | Source: Ambulatory Visit | Attending: Internal Medicine | Admitting: Internal Medicine

## 2019-02-13 ENCOUNTER — Inpatient Hospital Stay (HOSPITAL_COMMUNITY): Payer: PPO

## 2019-02-13 ENCOUNTER — Encounter (HOSPITAL_COMMUNITY): Payer: Self-pay

## 2019-02-13 VITALS — BP 122/78 | HR 67 | Temp 96.8°F | Ht 63.5 in | Wt 165.8 lb

## 2019-02-13 DIAGNOSIS — F419 Anxiety disorder, unspecified: Secondary | ICD-10-CM | POA: Diagnosis present

## 2019-02-13 DIAGNOSIS — I639 Cerebral infarction, unspecified: Principal | ICD-10-CM | POA: Diagnosis present

## 2019-02-13 DIAGNOSIS — Z803 Family history of malignant neoplasm of breast: Secondary | ICD-10-CM

## 2019-02-13 DIAGNOSIS — E785 Hyperlipidemia, unspecified: Secondary | ICD-10-CM | POA: Diagnosis present

## 2019-02-13 DIAGNOSIS — H53461 Homonymous bilateral field defects, right side: Secondary | ICD-10-CM | POA: Diagnosis not present

## 2019-02-13 DIAGNOSIS — H5461 Unqualified visual loss, right eye, normal vision left eye: Secondary | ICD-10-CM | POA: Diagnosis not present

## 2019-02-13 DIAGNOSIS — Z950 Presence of cardiac pacemaker: Secondary | ICD-10-CM | POA: Diagnosis not present

## 2019-02-13 DIAGNOSIS — M810 Age-related osteoporosis without current pathological fracture: Secondary | ICD-10-CM | POA: Diagnosis present

## 2019-02-13 DIAGNOSIS — Z8542 Personal history of malignant neoplasm of other parts of uterus: Secondary | ICD-10-CM

## 2019-02-13 DIAGNOSIS — R42 Dizziness and giddiness: Secondary | ICD-10-CM | POA: Diagnosis not present

## 2019-02-13 DIAGNOSIS — I10 Essential (primary) hypertension: Secondary | ICD-10-CM

## 2019-02-13 DIAGNOSIS — R41842 Visuospatial deficit: Secondary | ICD-10-CM | POA: Diagnosis present

## 2019-02-13 DIAGNOSIS — I152 Hypertension secondary to endocrine disorders: Secondary | ICD-10-CM | POA: Diagnosis not present

## 2019-02-13 DIAGNOSIS — R29701 NIHSS score 1: Secondary | ICD-10-CM | POA: Diagnosis present

## 2019-02-13 DIAGNOSIS — Z86010 Personal history of colonic polyps: Secondary | ICD-10-CM

## 2019-02-13 DIAGNOSIS — Z9049 Acquired absence of other specified parts of digestive tract: Secondary | ICD-10-CM | POA: Diagnosis not present

## 2019-02-13 DIAGNOSIS — Z811 Family history of alcohol abuse and dependence: Secondary | ICD-10-CM

## 2019-02-13 DIAGNOSIS — H547 Unspecified visual loss: Secondary | ICD-10-CM | POA: Diagnosis not present

## 2019-02-13 DIAGNOSIS — E1169 Type 2 diabetes mellitus with other specified complication: Secondary | ICD-10-CM | POA: Diagnosis not present

## 2019-02-13 DIAGNOSIS — Z8249 Family history of ischemic heart disease and other diseases of the circulatory system: Secondary | ICD-10-CM | POA: Diagnosis not present

## 2019-02-13 DIAGNOSIS — I63432 Cerebral infarction due to embolism of left posterior cerebral artery: Secondary | ICD-10-CM

## 2019-02-13 DIAGNOSIS — E041 Nontoxic single thyroid nodule: Secondary | ICD-10-CM | POA: Diagnosis not present

## 2019-02-13 DIAGNOSIS — R29818 Other symptoms and signs involving the nervous system: Secondary | ICD-10-CM | POA: Insufficient documentation

## 2019-02-13 DIAGNOSIS — R29810 Facial weakness: Secondary | ICD-10-CM | POA: Insufficient documentation

## 2019-02-13 DIAGNOSIS — Z825 Family history of asthma and other chronic lower respiratory diseases: Secondary | ICD-10-CM

## 2019-02-13 DIAGNOSIS — Z79899 Other long term (current) drug therapy: Secondary | ICD-10-CM

## 2019-02-13 DIAGNOSIS — R000 Tachycardia, unspecified: Secondary | ICD-10-CM | POA: Diagnosis present

## 2019-02-13 DIAGNOSIS — Z7984 Long term (current) use of oral hypoglycemic drugs: Secondary | ICD-10-CM | POA: Diagnosis not present

## 2019-02-13 DIAGNOSIS — E1159 Type 2 diabetes mellitus with other circulatory complications: Secondary | ICD-10-CM | POA: Diagnosis present

## 2019-02-13 DIAGNOSIS — K219 Gastro-esophageal reflux disease without esophagitis: Secondary | ICD-10-CM | POA: Diagnosis present

## 2019-02-13 DIAGNOSIS — E119 Type 2 diabetes mellitus without complications: Secondary | ICD-10-CM | POA: Diagnosis not present

## 2019-02-13 DIAGNOSIS — Z888 Allergy status to other drugs, medicaments and biological substances status: Secondary | ICD-10-CM

## 2019-02-13 DIAGNOSIS — Z90710 Acquired absence of both cervix and uterus: Secondary | ICD-10-CM | POA: Diagnosis not present

## 2019-02-13 DIAGNOSIS — I6623 Occlusion and stenosis of bilateral posterior cerebral arteries: Secondary | ICD-10-CM | POA: Diagnosis not present

## 2019-02-13 HISTORY — DX: Cerebral infarction, unspecified: I63.9

## 2019-02-13 LAB — CBC WITH DIFFERENTIAL/PLATELET
Abs Immature Granulocytes: 0.03 10*3/uL (ref 0.00–0.07)
Basophils Absolute: 0 10*3/uL (ref 0.0–0.1)
Basophils Relative: 0 %
Eosinophils Absolute: 0.1 10*3/uL (ref 0.0–0.5)
Eosinophils Relative: 2 %
HCT: 42.5 % (ref 36.0–46.0)
Hemoglobin: 13.3 g/dL (ref 12.0–15.0)
Immature Granulocytes: 0 %
Lymphocytes Relative: 26 %
Lymphs Abs: 2.1 10*3/uL (ref 0.7–4.0)
MCH: 29.2 pg (ref 26.0–34.0)
MCHC: 31.3 g/dL (ref 30.0–36.0)
MCV: 93.2 fL (ref 80.0–100.0)
Monocytes Absolute: 0.5 10*3/uL (ref 0.1–1.0)
Monocytes Relative: 6 %
NEUTROS PCT: 66 %
Neutro Abs: 5.4 10*3/uL (ref 1.7–7.7)
Platelets: 202 10*3/uL (ref 150–400)
RBC: 4.56 MIL/uL (ref 3.87–5.11)
RDW: 12.9 % (ref 11.5–15.5)
WBC: 8.1 10*3/uL (ref 4.0–10.5)
nRBC: 0 % (ref 0.0–0.2)

## 2019-02-13 LAB — BASIC METABOLIC PANEL
Anion gap: 8 (ref 5–15)
BUN: 20 mg/dL (ref 8–23)
CO2: 27 mmol/L (ref 22–32)
CREATININE: 0.7 mg/dL (ref 0.44–1.00)
Calcium: 9.4 mg/dL (ref 8.9–10.3)
Chloride: 103 mmol/L (ref 98–111)
GFR calc Af Amer: >60 mL/min (ref >60)
GFR calc non Af Amer: >60 mL/min (ref >60)
Glucose, Bld: 159 mg/dL — ABNORMAL HIGH (ref 70–99)
Potassium: 4 mmol/L (ref 3.5–5.1)
SODIUM: 138 mmol/L (ref 135–145)

## 2019-02-13 LAB — CBG MONITORING, ED: GLUCOSE-CAPILLARY: 128 mg/dL — AB (ref 70–99)

## 2019-02-13 MED ORDER — IOHEXOL 300 MG/ML  SOLN
75.0000 mL | Freq: Once | INTRAMUSCULAR | Status: AC | PRN
  Administered 2019-02-13: 75 mL via INTRAVENOUS

## 2019-02-13 MED ORDER — INSULIN ASPART 100 UNIT/ML ~~LOC~~ SOLN: 0.0000 [IU] | Freq: Every day | SUBCUTANEOUS | Status: DC

## 2019-02-13 MED ORDER — LINAGLIPTIN 5 MG PO TABS
5.0000 mg | ORAL_TABLET | Freq: Every day | ORAL | Status: DC
  Administered 2019-02-14 – 2019-02-15 (×2): 5 mg via ORAL
  Filled 2019-02-13 (×2): qty 1

## 2019-02-13 MED ORDER — ACETAMINOPHEN 325 MG PO TABS: 650.0000 mg | ORAL_TABLET | ORAL | Status: DC | PRN

## 2019-02-13 MED ORDER — SENNOSIDES-DOCUSATE SODIUM 8.6-50 MG PO TABS: 1.0000 | ORAL_TABLET | Freq: Every evening | ORAL | Status: DC | PRN

## 2019-02-13 MED ORDER — ENOXAPARIN SODIUM 40 MG/0.4ML ~~LOC~~ SOLN
40.0000 mg | Freq: Every day | SUBCUTANEOUS | Status: DC
  Administered 2019-02-14 – 2019-02-15 (×2): 40 mg via SUBCUTANEOUS
  Filled 2019-02-13 (×2): qty 0.4

## 2019-02-13 MED ORDER — IOHEXOL 350 MG/ML SOLN
50.0000 mL | Freq: Once | INTRAVENOUS | Status: AC | PRN
  Administered 2019-02-13: 50 mL via INTRAVENOUS

## 2019-02-13 MED ORDER — ASPIRIN 325 MG PO TABS
325.0000 mg | ORAL_TABLET | Freq: Once | ORAL | Status: AC
  Administered 2019-02-13: 325 mg via ORAL
  Filled 2019-02-13: qty 1

## 2019-02-13 MED ORDER — ACETAMINOPHEN 650 MG RE SUPP: 650.0000 mg | RECTAL | Status: DC | PRN

## 2019-02-13 MED ORDER — ACETAMINOPHEN 160 MG/5ML PO SOLN: 650.0000 mg | ORAL | Status: DC | PRN

## 2019-02-13 MED ORDER — PANTOPRAZOLE SODIUM 40 MG PO TBEC
40.0000 mg | DELAYED_RELEASE_TABLET | Freq: Every day | ORAL | Status: DC
  Administered 2019-02-14 – 2019-02-15 (×2): 40 mg via ORAL
  Filled 2019-02-13 (×2): qty 1

## 2019-02-13 MED ORDER — STROKE: EARLY STAGES OF RECOVERY BOOK
Freq: Once | Status: AC
  Administered 2019-02-14: 01:00:00
  Filled 2019-02-13: qty 1

## 2019-02-13 MED ORDER — INSULIN ASPART 100 UNIT/ML ~~LOC~~ SOLN
0.0000 [IU] | Freq: Three times a day (TID) | SUBCUTANEOUS | Status: DC
  Administered 2019-02-14: 2 [IU] via SUBCUTANEOUS
  Administered 2019-02-14 – 2019-02-15 (×2): 1 [IU] via SUBCUTANEOUS
  Administered 2019-02-15: 3 [IU] via SUBCUTANEOUS

## 2019-02-13 MED ORDER — SERTRALINE HCL 50 MG PO TABS
50.0000 mg | ORAL_TABLET | Freq: Every day | ORAL | Status: DC
  Administered 2019-02-14: 50 mg via ORAL
  Filled 2019-02-13 (×2): qty 1

## 2019-02-13 NOTE — Progress Notes (Signed)
BP 122/78   Pulse 67   Temp (!) 96.8 F (36 C) (Oral)   Ht 5' 3.5" (1.613 m)   Wt 165 lb 12.8 oz (75.2 kg)   BMI 28.91 kg/m    Subjective:    Patient ID: [name], female    DOB: Jan 31, 1951, 68 y.o.   MRN: [id number]  HPI: [name] is a 68 y.o. female presenting on 02/13/2019 for Blurred Vision (Right eye x 4 days- Patient states she has not been to the eye doctor. Monday morning when she got up Monday morning her BS was 260 and states she got a sharpe pain in the back of her head lasted all day Monday.)   HPI New right lateral vision loss Patient is coming in complaining of new right lateral vision loss that is been going on over the past 4 days.  She says she started with a headache and some right lateral vision loss that was also associated with some neck pain and some dizziness at the time, all the other symptoms have resolved almost immediately after the initial episode.  She said her blood sugars were also over the 260s at the initial episode as well which that is improved as well.  She says the only thing that stuck is that the right lateral aspect of her vision is gone and has not returned and she says to read things she will have to slide them over towards her left eye so she can see because that half of her right I cannot see.  She denies any pain in the eye itself.  She did say she saw some spots initially but denies any floaters.  Relevant past medical, surgical, family and social history reviewed and updated as indicated. Interim medical history since our last visit reviewed. Allergies and medications reviewed and updated.  Review of Systems  Constitutional: Negative for chills and fever.  Eyes: Positive for visual disturbance. Negative for photophobia, pain, discharge, redness and itching.  Respiratory: Negative for chest tightness and shortness of breath.   Cardiovascular: Negative for chest pain and leg swelling.  Musculoskeletal: Negative for back pain and  gait problem.  Skin: Negative for rash.  Neurological: Negative for light-headedness and headaches.  Psychiatric/Behavioral: Negative for agitation and behavioral problems.  All other systems reviewed and are negative.   Per HPI unless specifically indicated above   Allergies as of 02/13/2019      Reactions   Crestor [rosuvastatin] Other (See Comments)   Myalgias   Pravachol [pravastatin Sodium] Other (See Comments)   Stomach pain and constipation      Medication List       Accurate as of February 13, 2019 10:12 AM. Always use your most recent med list.        BYSTOLIC 5 MG tablet Generic drug:  nebivolol TAKE  (1)  TABLET TWICE A DAY.   calcium citrate-vitamin D 315-200 MG-UNIT tablet Commonly known as:  CITRACAL+D Take 1 tablet by mouth daily.   fluticasone 50 MCG/ACT nasal spray Commonly known as:  FLONASE Place 2 sprays into both nostrils daily.   glucose blood test strip Use to check BG up to qd   metFORMIN 500 MG tablet Commonly known as:  GLUCOPHAGE Take 1.5 tablets (750 mg total) by mouth daily with breakfast.   ONE TOUCH ULTRA 2 w/Device Kit 1 Device by Does not apply route daily.   ONETOUCH DELICA LANCETS FINE Misc Use to check BG up to qd   pantoprazole  40 MG tablet Commonly known as:  PROTONIX Take 1 tablet (40 mg total) by mouth daily.   sertraline 50 MG tablet Commonly known as:  ZOLOFT Take 1 tablet (50 mg total) by mouth daily.   sitaGLIPtin 100 MG tablet Commonly known as:  JANUVIA Take 1 tablet (100 mg total) by mouth daily.          Objective:    BP 122/78   Pulse 67   Temp (!) 96.8 F (36 C) (Oral)   Ht 5' 3.5" (1.613 m)   Wt 165 lb 12.8 oz (75.2 kg)   BMI 28.91 kg/m   Wt Readings from Last 3 Encounters:  02/13/19 165 lb 12.8 oz (75.2 kg)  01/13/19 167 lb (75.8 kg)  12/31/18 165 lb 12.8 oz (75.2 kg)    Physical Exam Vitals signs and nursing note reviewed.  Constitutional:      General: She is not in acute  distress.    Appearance: She is well-developed. She is not diaphoretic.  Eyes:     General: No scleral icterus.       Right eye: No discharge.        Left eye: No discharge.     Extraocular Movements: Extraocular movements intact.     Conjunctiva/sclera: Conjunctivae normal.     Right eye: Right conjunctiva is not injected. No hemorrhage.    Left eye: Left conjunctiva is not injected. No hemorrhage.    Pupils: Pupils are equal, round, and reactive to light.  Cardiovascular:     Rate and Rhythm: Normal rate and regular rhythm.     Heart sounds: Normal heart sounds. No murmur.  Pulmonary:     Effort: Pulmonary effort is normal. No respiratory distress.     Breath sounds: Normal breath sounds. No wheezing.  Musculoskeletal: Normal range of motion.        General: No tenderness.  Skin:    General: Skin is warm and dry.     Findings: No rash.  Neurological:     Mental Status: She is alert and oriented to person, place, and time.     Coordination: Coordination normal.  Psychiatric:        Behavior: Behavior normal.         Assessment & Plan:   Problem List Items Addressed This Visit    None    Visit Diagnoses    Vision loss of right eye    -  Primary      She is going to see Dr. Truman Hayward ophthalmologist over at  [facility] ophthalmology, we are consider an MRI but Dr. Truman Hayward will send [place] a message back if she finds anything on the scan of her eye and then let us know.  Follow up plan: Return if symptoms worsen or fail to improve.  Counseling provided for all of the vaccine components No orders of the defined types were placed in this encounter.   Caryl Pina, MD [facility] 02/13/2019, 10:12 AM

## 2019-02-13 NOTE — Telephone Encounter (Signed)
Please have patient take aspirin 325 now and then needs to take a baby aspirin every day from here on, will do stat neurology referral

## 2019-02-13 NOTE — ED Notes (Signed)
On Monday, felt light headed in the morning,  Had some right eye vision changes and  a sharp pain in back of the head. Took Asprin, ate and rested the day.  Saw PCP  And eye doctor today.

## 2019-02-13 NOTE — ED Provider Notes (Signed)
[facility] EMERGENCY DEPARTMENT Provider Note   CSN: [id number] Arrival date & time: 02/13/19  1721    History   Chief Complaint Chief Complaint  Patient presents with  . Code Stroke    HPI [name] is a 68 y.o. female.     Patient presents with concern of visual field blurring in the right eye lateral aspect.  Symptoms started Monday a.m. when she felt lightheaded and pain in the occipital area.  She took Advil, baby aspirin, Celebrex and rested.  Tuesday she was tired.  She went to the eye doctor today with an uncertain diagnosis.  She then saw her primary care doctor today.  He referred to the emergency department for further evaluation.  No mental status changes, arm or leg weakness, facial asymmetry, stiff neck, prodromal illnesses.  Severity is moderate.  Nothing makes symptoms better or worse.     Past Medical History:  Diagnosis Date  . Anxiety   . Arthritis    of the neck  . Colon polyps   . Esophageal stricture   . GERD (gastroesophageal reflux disease)    Hiatal Hernia  . History of uterine cancer   . Hyperlipidemia   . Hypertension   . Osteoporosis   . Pacemaker   . Symptomatic bradycardia     Patient Active Problem List   Diagnosis Date Noted  . Symptomatic bradycardia   . Osteoporosis   . BMI 29.0-29.9,adult 10/29/2016  . Type 2 diabetes mellitus without complication, without long-term current use of insulin ([place]) 01/12/2016  . Vitamin D deficiency 01/21/2015  . Palpitations 04/15/2014  . GERD (gastroesophageal reflux disease)   . Arthritis   . History of uterine cancer   . Hyperlipidemia associated with type 2 diabetes mellitus ([place])   . Hypertension associated with diabetes ([place])   . Osteopenia   . Anxiety   . Colon polyps   . Esophageal stricture   . Pacemaker   . 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 APPENDECTOMY    . CHOLECYSTECTOMY    . LAPAROSCOPIC TOTAL HYSTERECTOMY    . LYMPH  NODE DISSECTION     bilateral pelvic and right-sided periaortic  . PACEMAKER GENERATOR CHANGE N/A 05/21/2014   Procedure: PACEMAKER GENERATOR CHANGE;  Surgeon: Evans Lance, MD;  Location: [facility] CATH LAB;  Service: Cardiovascular;  Laterality: N/A;  . status post pacemaker     metronic [id number]  . TRANSTHORACIC ECHOCARDIOGRAM  08/30/04  . TUBAL LIGATION       OB History    Gravida  1   Para  1   Term  1   Preterm      AB      Living        SAB      TAB      Ectopic      Multiple      Live Births               Home Medications    Prior to Admission medications   Medication Sig Start Date End Date Taking? Authorizing Provider  Blood Glucose Monitoring Suppl (ONE TOUCH ULTRA 2) w/Device KIT 1 Device by Does not apply route daily. 09/17/17  Yes Eustaquio Maize, MD  BYSTOLIC 5 MG tablet TAKE  (1)  TABLET TWICE A DAY. 11/27/18  Yes Evans Lance, MD  calcium citrate-vitamin D (CITRACAL+D) 315-200 MG-UNIT per tablet Take 1 tablet by mouth daily.  Yes [provider]  glucose blood test strip Use to check BG up to qd 09/02/17  Yes Eustaquio Maize, MD  metFORMIN (GLUCOPHAGE) 500 MG tablet Take 1.5 tablets (750 mg total) by mouth daily with breakfast. 01/13/19  Yes Gottschalk, Ashly M, DO  ONETOUCH DELICA LANCETS FINE MISC Use to check BG up to qd 09/02/17  Yes Eustaquio Maize, MD  pantoprazole (PROTONIX) 40 MG tablet Take 1 tablet (40 mg total) by mouth daily. 01/13/19  Yes Gottschalk, Leatrice Jewels M, DO  sertraline (ZOLOFT) 50 MG tablet Take 1 tablet (50 mg total) by mouth daily. 01/13/19  Yes Gottschalk, Ashly M, DO  sitaGLIPtin (JANUVIA) 100 MG tablet Take 1 tablet (100 mg total) by mouth daily. 01/13/19  Yes Ronnie Doss M, DO  metoprolol succinate (TOPROL-XL) 25 MG 24 hr tablet Take 1 tablet (25 mg total) by mouth daily. 03/31/15 04/18/15  Sharion Balloon, FNP    Family History Family History  Problem Relation Age of Onset  . Heart disease Mother   . Heart  attack Mother   . Heart disease Father   . Heart attack Father   . Cancer Sister        breast  . Alcohol abuse Brother   . COPD Sister   . Healthy Daughter     Social History Social History   Tobacco Use  . Smoking status: Never Smoker  . Smokeless tobacco: Never Used  Substance Use Topics  . Alcohol use: No    Comment: may have a social drink rarely  . Drug use: No     Allergies   Crestor [rosuvastatin] and Pravachol [pravastatin sodium]   Review of Systems Review of Systems  All other systems reviewed and are negative.    Physical Exam Updated Vital Signs BP (!) 143/74   Pulse 69   Temp 97.9 F (36.6 C) (Oral)   Resp 15   Ht 5' 3.5" (1.613 m)   Wt 74.8 kg   SpO2 96%   BMI 28.77 kg/m   Physical Exam Vitals signs and nursing note reviewed.  Constitutional:      Appearance: She is well-developed.  HENT:     Head: Normocephalic and atraumatic.     Comments: Left visual field normal.  Right visual field impaired and lateral aspect Eyes:     Conjunctiva/sclera: Conjunctivae normal.  Neck:     Musculoskeletal: Neck supple.  Cardiovascular:     Rate and Rhythm: Normal rate and regular rhythm.  Pulmonary:     Effort: Pulmonary effort is normal.     Breath sounds: Normal breath sounds.  Abdominal:     General: Bowel sounds are normal.     Palpations: Abdomen is soft.  Musculoskeletal: Normal range of motion.  Skin:    General: Skin is warm and dry.  Neurological:     Mental Status: She is alert and oriented to person, place, and time.  Psychiatric:        Behavior: Behavior normal.      ED Treatments / Results  Labs (all labs ordered are listed, but only abnormal results are displayed) Labs Reviewed  BASIC METABOLIC PANEL - Abnormal; Notable for the following components:      Result Value   Glucose, Bld 159 (*)    All other components within normal limits  CBG MONITORING, ED - Abnormal; Notable for the following components:    Glucose-Capillary 128 (*)    All other components within normal limits  CBC WITH DIFFERENTIAL/PLATELET  EKG None  Radiology Ct Head Wo Contrast  Result Date: 02/13/2019 CLINICAL DATA:  68 year old female with dizziness EXAM: CT HEAD WITHOUT CONTRAST TECHNIQUE: Contiguous axial images were obtained from the base of the skull through the vertex without intravenous contrast. COMPARISON:  11/03/2011 FINDINGS: Brain: No acute intracranial hemorrhage. No midline shift or mass effect. Focal hypodensity in the left occipital lobe, not present on the comparison. Vascular: Calcifications of the anterior circulation. Skull: No acute fracture.  No aggressive bone lesion identified. Sinuses/Orbits: Unremarkable appearance of the orbits. Mastoid air cells clear. No middle ear effusion. No significant sinus disease. Other: None IMPRESSION: New focal hypodensity in the left occipital lobe. This most likely represents a completed infarction in the left PCA distribution, however, neoplasm can not be excluded. Contrast-enhanced MRI is recommended, as well as neurology consultation. Electronically Signed   By: Corrie Mckusick D.O.   On: 02/13/2019 15:52   Ct Head W Contrast  Result Date: 02/13/2019 CLINICAL DATA:  Dizziness. Left occipital lesion on previous noncontrast study. EXAM: CT HEAD WITH CONTRAST TECHNIQUE: Contiguous axial images were obtained from the base of the skull through the vertex with intravenous contrast. CONTRAST:  76m OMNIPAQUE IOHEXOL 300 MG/ML  SOLN COMPARISON:  Earlier same day. FINDINGS: Brain: Following contrast administration, there is no finding to suggest the presence of tumor in the left occipital lobe. The area of low-density looks like a subacute stroke. The remainder the brain appears normal. No mass, hemorrhage, hydrocephalus or extra-axial collection. Vascular: Major vessels at the base of the brain show flow. Skull: Negative Sinuses/Orbits: Clear/normal Other: None IMPRESSION: After  contrast administration, the left occipital low-density shows findings consistent with an acute/subacute occipital stroke. There is no finding to suggest the presence of an underlying mass lesion. These results will be called to the ordering clinician or representative by the Radiologist Assistant, and communication documented in the PACS or zVision Dashboard. Electronically Signed   By: MNelson ChimesM.D.   On: 02/13/2019 17:08    Procedures Procedures (including critical care time)  Medications Ordered in ED Medications - No data to display   Initial Impression / Assessment and Plan / ED Course  I have reviewed the triage vital signs and the nursing notes.  Pertinent labs & imaging results that were available during my care of the patient were reviewed by me and considered in my medical decision making (see chart for details).        Patient presents with concerns of occipital headache and blurred vision in the lateral aspect of the right eye visual field.  CT head with contrast shows a low-density acute/subacute stroke in the left occipital area.  Will discuss with neuro hospitalist.  Final Clinical Impressions(s) / ED Diagnoses   Final diagnoses:  Cerebrovascular accident (CVA), unspecified mechanism ([facility]    ED Discharge Orders    None       CNat Christen MD 02/13/19 1904

## 2019-02-13 NOTE — H&P (Signed)
History and Physical  [name] TKW:[id number] DOB: Jan 01, 1951 DOA: 02/13/2019  Referring physician: Dr Lacinda Axon, ED physician PCP: Janora Norlander, DO  Outpatient Specialists: none  Patient Coming From: home  Chief Complaint: vision changes  HPI: [name] is a 68 y.o. 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 Patient seen for headache on Monday with vision changes - had blurred vision in right visual field most noticeable when reading. Headache in back of head and in neck lasted for most of day on Monday, improved a little with Celebrex. No palliating or provoking factors. No radiation of pain. When to PCP's office today - was sent to eye doctor's, who sent her to [facility] ED for evaluation.    Emergency Department Course: CT shows acute/subacute left occipital stroke. EDP talked with neurohospitalist, who recommended CTA and MRI.   Review of Systems:   Pt denies any fevers, chills, nausea, vomiting, diarrhea, constipation, abdominal pain, shortness of breath, dyspnea on exertion, orthopnea, cough, wheezing, palpitations, headache, vision changes, lightheadedness, dizziness, melena, rectal bleeding.  Review of systems are otherwise negative  Past Medical History:  Diagnosis Date  . Anxiety   . Arthritis    of the neck  . Colon polyps   . Esophageal stricture   . GERD (gastroesophageal reflux disease)    Hiatal Hernia  . History of uterine cancer   . Hyperlipidemia   . Hypertension   . Osteoporosis   . Pacemaker   . Symptomatic bradycardia    Past Surgical History:  Procedure Laterality Date  . APPENDECTOMY    . CHOLECYSTECTOMY    . LAPAROSCOPIC TOTAL HYSTERECTOMY    . LYMPH NODE DISSECTION     bilateral pelvic and right-sided periaortic  . PACEMAKER GENERATOR CHANGE N/A 05/21/2014   Procedure: PACEMAKER GENERATOR CHANGE;  Surgeon: Evans Lance, MD;  Location: [facility] CATH LAB;  Service: Cardiovascular;  Laterality: N/A;  .  status post pacemaker     metronic [id number]  . TRANSTHORACIC ECHOCARDIOGRAM  08/30/04  . TUBAL LIGATION     Social History:  reports that she has never smoked. She has never used smokeless tobacco. She reports that she does not drink alcohol or use drugs. Patient lives at home  Allergies  Allergen Reactions  . Crestor [Rosuvastatin] Other (See Comments)    Myalgias   . Pravachol [Pravastatin Sodium] Other (See Comments)    Stomach pain and constipation     Family History  Problem Relation Age of Onset  . Heart disease Mother   . Heart attack Mother   . Heart disease Father   . Heart attack Father   . Cancer Sister        breast  . Alcohol abuse Brother   . COPD Sister   . Healthy Daughter       Prior to Admission medications   Medication Sig Start Date End Date Taking? Authorizing Provider  Blood Glucose Monitoring Suppl (ONE TOUCH ULTRA 2) w/Device KIT 1 Device by Does not apply route daily. 09/17/17  Yes Eustaquio Maize, MD  BYSTOLIC 5 MG tablet TAKE  (1)  TABLET TWICE A DAY. 11/27/18  Yes Evans Lance, MD  calcium citrate-vitamin D (CITRACAL+D) 315-200 MG-UNIT per tablet Take 1 tablet by mouth daily.    Yes [provider]  glucose blood test strip Use to check BG up to qd 09/02/17  Yes Eustaquio Maize, MD  metFORMIN (GLUCOPHAGE) 500 MG tablet Take 1.5  tablets (750 mg total) by mouth daily with breakfast. 01/13/19  Yes Gottschalk, Ashly M, DO  ONETOUCH DELICA LANCETS FINE MISC Use to check BG up to qd 09/02/17  Yes Eustaquio Maize, MD  pantoprazole (PROTONIX) 40 MG tablet Take 1 tablet (40 mg total) by mouth daily. 01/13/19  Yes Gottschalk, Leatrice Jewels M, DO  sertraline (ZOLOFT) 50 MG tablet Take 1 tablet (50 mg total) by mouth daily. 01/13/19  Yes Gottschalk, Ashly M, DO  sitaGLIPtin (JANUVIA) 100 MG tablet Take 1 tablet (100 mg total) by mouth daily. 01/13/19  Yes Ronnie Doss M, DO  metoprolol succinate (TOPROL-XL) 25 MG 24 hr tablet Take 1 tablet (25 mg  total) by mouth daily. 03/31/15 04/18/15  Sharion Balloon, FNP    Physical Exam: BP (!) 159/89   Pulse 69   Temp 97.9 F (36.6 C) (Oral)   Resp 16   Ht 5' 3.5" (1.613 m)   Wt 74.8 kg   SpO2 97%   BMI 28.77 kg/m   . General: Elderly female. Awake and alert and oriented x3. No acute cardiopulmonary distress.  Marland Kitchen HEENT: Normocephalic atraumatic.  Right and left ears normal in appearance.  Pupils equal, round, reactive to light. Extraocular muscles are intact. Sclerae anicteric and noninjected.  Moist mucosal membranes. No mucosal lesions.  . Neck: Neck supple without lymphadenopathy. No carotid bruits. No masses palpated.  . Cardiovascular: Regular rate with normal S1-S2 sounds. No murmurs, rubs, gallops auscultated. No JVD.  Marland Kitchen Respiratory: Good respiratory effort with no wheezes, rales, rhonchi. Lungs clear to auscultation bilaterally.  No accessory muscle use. . Abdomen: Soft, nontender, nondistended. Active bowel sounds. No masses or hepatosplenomegaly  . Skin: No rashes, lesions, or ulcerations.  Dry, warm to touch. 2+ dorsalis pedis and radial pulses. . Musculoskeletal: No calf or leg pain. All major joints not erythematous nontender.  No upper or lower joint deformation.  Good ROM.  No contractures  . Psychiatric: Intact judgment and insight. Pleasant and cooperative. . Neurologic: No focal neurological deficits. Strength is 5/5 and symmetric in upper and lower extremities.  Cranial nerves II through XII are grossly intact. Still has complete field of vision, but has "squiggly lines" on right field of vision. Coordination intact.           Labs on Admission: I have personally reviewed following labs and imaging studies  CBC: Recent Labs  Lab 02/13/19 1819  WBC 8.1  NEUTROABS 5.4  HGB 13.3  HCT 42.5  MCV 93.2  PLT 546   Basic Metabolic Panel: Recent Labs  Lab 02/13/19 1819  NA 138  K 4.0  CL 103  CO2 27  GLUCOSE 159*  BUN 20  CREATININE 0.70  CALCIUM 9.4    GFR: Estimated Creatinine Clearance: 66.9 mL/min (by C-G formula based on SCr of 0.7 mg/dL). Liver Function Tests: No results for input(s): AST, ALT, ALKPHOS, BILITOT, PROT, ALBUMIN in the last 168 hours. No results for input(s): LIPASE, AMYLASE in the last 168 hours. No results for input(s): AMMONIA in the last 168 hours. Coagulation Profile: No results for input(s): INR, PROTIME in the last 168 hours. Cardiac Enzymes: No results for input(s): CKTOTAL, CKMB, CKMBINDEX, TROPONINI in the last 168 hours. BNP (last 3 results) No results for input(s): PROBNP in the last 8760 hours. HbA1C: No results for input(s): HGBA1C in the last 72 hours. CBG: Recent Labs  Lab 02/13/19 1737  GLUCAP 128*   Lipid Profile: No results for input(s): CHOL, HDL, LDLCALC, TRIG, CHOLHDL, LDLDIRECT  in the last 72 hours. Thyroid Function Tests: No results for input(s): TSH, T4TOTAL, FREET4, T3FREE, THYROIDAB in the last 72 hours. Anemia Panel: No results for input(s): VITAMINB12, FOLATE, FERRITIN, TIBC, IRON, RETICCTPCT in the last 72 hours. Urine analysis:    Component Value Date/Time   BILIRUBINUR neg 08/25/2015 1551   PROTEINUR 4+ 08/25/2015 1551   UROBILINOGEN negative 08/25/2015 1551   NITRITE neg 08/25/2015 1551   LEUKOCYTESUR large (3+) (A) 08/25/2015 1551   Sepsis Labs: '@LABRCNTIP' (procalcitonin:4,lacticidven:4) )No results found for this or any previous visit (from the past 240 hour(s)).   Radiological Exams on Admission: Ct Head Wo Contrast  Result Date: 02/13/2019 CLINICAL DATA:  68 year old female with dizziness EXAM: CT HEAD WITHOUT CONTRAST TECHNIQUE: Contiguous axial images were obtained from the base of the skull through the vertex without intravenous contrast. COMPARISON:  11/03/2011 FINDINGS: Brain: No acute intracranial hemorrhage. No midline shift or mass effect. Focal hypodensity in the left occipital lobe, not present on the comparison. Vascular: Calcifications of the anterior  circulation. Skull: No acute fracture.  No aggressive bone lesion identified. Sinuses/Orbits: Unremarkable appearance of the orbits. Mastoid air cells clear. No middle ear effusion. No significant sinus disease. Other: None IMPRESSION: New focal hypodensity in the left occipital lobe. This most likely represents a completed infarction in the left PCA distribution, however, neoplasm can not be excluded. Contrast-enhanced MRI is recommended, as well as neurology consultation. Electronically Signed   By: Corrie Mckusick D.O.   On: 02/13/2019 15:52   Ct Head W Contrast  Result Date: 02/13/2019 CLINICAL DATA:  Dizziness. Left occipital lesion on previous noncontrast study. EXAM: CT HEAD WITH CONTRAST TECHNIQUE: Contiguous axial images were obtained from the base of the skull through the vertex with intravenous contrast. CONTRAST:  24m OMNIPAQUE IOHEXOL 300 MG/ML  SOLN COMPARISON:  Earlier same day. FINDINGS: Brain: Following contrast administration, there is no finding to suggest the presence of tumor in the left occipital lobe. The area of low-density looks like a subacute stroke. The remainder the brain appears normal. No mass, hemorrhage, hydrocephalus or extra-axial collection. Vascular: Major vessels at the base of the brain show flow. Skull: Negative Sinuses/Orbits: Clear/normal Other: None IMPRESSION: After contrast administration, the left occipital low-density shows findings consistent with an acute/subacute occipital stroke. There is no finding to suggest the presence of an underlying mass lesion. These results will be called to the ordering clinician or representative by the Radiologist Assistant, and communication documented in the PACS or zVision Dashboard. Electronically Signed   By: MNelson ChimesM.D.   On: 02/13/2019 17:08    EKG: Independently reviewed. Sinus rhythm. Left axis deviation. No acute ST changes.  Assessment/Plan: Principal Problem:   Occipital stroke (HCC) Active Problems:   GERD  (gastroesophageal reflux disease)   Hyperlipidemia associated with type 2 diabetes mellitus ([facility]   Hypertension associated with diabetes ([facility]   Pacemaker   Type 2 diabetes mellitus without complication, without long-term current use of insulin ([facility]    This patient was discussed with the ED physician, including pertinent vitals, physical exam findings, labs, and imaging.  We also discussed care given by the ED provider.  1. Occipital Stroke Observation on telemetry CTA head/neck Unable to do MRI due to pacemaker Echocardiogram tomorrow Hemoglobin A1c, lipid panel in the morning PT/OT/speech therapy consult Full aspirin Permissive htn. 2. T2DM a. Continue Januvia b. Hold metformin for CTA c. SSI, CBGs AC/qhs 3. HTN a. Hold antihypertensives 4. Pacemaker a. Unable to do MRI 5. GERD a. Continue  protonix  DVT prophylaxis: Lovenox Consultants: neurology Code Status: Full Family Communication: none  Disposition Plan: home following evaluation   Truett Mainland, DO

## 2019-02-13 NOTE — ED Triage Notes (Signed)
Patient brought over from Radiology for stroke positive CT scan. Physician notified. Onset Monday morning.

## 2019-02-13 NOTE — Addendum Note (Signed)
Addended by: Caryl Pina on: 02/13/2019 12:39 PM   Modules accepted: Orders

## 2019-02-14 ENCOUNTER — Inpatient Hospital Stay (HOSPITAL_COMMUNITY): Payer: PPO

## 2019-02-14 DIAGNOSIS — I639 Cerebral infarction, unspecified: Principal | ICD-10-CM

## 2019-02-14 LAB — GLUCOSE, CAPILLARY
Glucose-Capillary: 117 mg/dL — ABNORMAL HIGH (ref 70–99)
Glucose-Capillary: 135 mg/dL — ABNORMAL HIGH (ref 70–99)
Glucose-Capillary: 159 mg/dL — ABNORMAL HIGH (ref 70–99)
Glucose-Capillary: 181 mg/dL — ABNORMAL HIGH (ref 70–99)
Glucose-Capillary: 194 mg/dL — ABNORMAL HIGH (ref 70–99)

## 2019-02-14 LAB — HEMOGLOBIN A1C
Hgb A1c MFr Bld: 7.3 % — ABNORMAL HIGH (ref 4.8–5.6)
Mean Plasma Glucose: 162.81 mg/dL

## 2019-02-14 LAB — LIPID PANEL
Cholesterol: 206 mg/dL — ABNORMAL HIGH (ref 0–200)
HDL: 37 mg/dL — ABNORMAL LOW (ref >40)
LDL Cholesterol: 107 mg/dL — ABNORMAL HIGH (ref 0–99)
Total CHOL/HDL Ratio: 5.6 RATIO
Triglycerides: 309 mg/dL — ABNORMAL HIGH (ref <150)
VLDL: 62 mg/dL — AB (ref 0–40)

## 2019-02-14 LAB — ECHOCARDIOGRAM COMPLETE
Height: 63 in
Weight: 2680.79 oz

## 2019-02-14 MED ORDER — SODIUM CHLORIDE 0.9 % IV SOLN
INTRAVENOUS | Status: DC
  Administered 2019-02-14 – 2019-02-15 (×3): via INTRAVENOUS

## 2019-02-14 MED ORDER — ASPIRIN 325 MG PO TABS
325.0000 mg | ORAL_TABLET | Freq: Every day | ORAL | Status: DC
  Administered 2019-02-14 – 2019-02-15 (×2): 325 mg via ORAL
  Filled 2019-02-14 (×2): qty 1

## 2019-02-14 MED ORDER — ROSUVASTATIN CALCIUM 5 MG PO TABS
5.0000 mg | ORAL_TABLET | Freq: Every day | ORAL | Status: DC
  Administered 2019-02-14: 5 mg via ORAL
  Filled 2019-02-14: qty 1

## 2019-02-14 NOTE — Progress Notes (Signed)
PROGRESS NOTE    [name]  WJX:[id number] DOB: [birth year] DOA: 02/13/2019 PCP: Janora Norlander, DO   Brief Narrative: 68 year old with past medical history significant for diabetes, GERD, symptomatic bradycardia status post pacemaker, hypertension.  Patient was seen for headache on Monday with vision changes, had blurry vision in the right visual field most noticeable when reading.  She reported headache in the back of her head and neck.  Referred to [facility] for further evaluation. CT head showed acute subacute left occipital stroke.    Assessment & Plan:   Principal Problem:   Occipital stroke ([place]) Active Problems:   GERD (gastroesophageal reflux disease)   Hyperlipidemia associated with type 2 diabetes mellitus ([place])   Hypertension associated with diabetes ([place])   Pacemaker   Type 2 diabetes mellitus without complication, without long-term current use of insulin ([facility])  1-acute subacute occipital stroke: Unable to perform MRI. Plan to repeat CT head today. Neurology consulted. Echo pending. Globin A1c 7.3. Continue with aspirin. PT, OT, speech therapy evaluation. LDL 107, triglycerides 309.  Discussed diet recommendation with patient.  She agreed to start taking low-dose Crestor.  Crestor was to starting that she was able to tolerate the most.  Diabetes type 2: Continue with Januvia.  Sliding scale insulin.  Holding metformin  Hypertension hold blood pressure medication.  Tachycardia, status post pacemaker     Estimated body mass index is 29.68 kg/m as calculated from the following:   Height as of this encounter: 5\' 3"  (1.6 m).   Weight as of this encounter: 76 kg.   DVT prophylaxis:  lovenox Code Status: full code Family Communication: care discussed with patient  Disposition Plan:  Home when work up completed.   Consultants:   Neurology    Procedures:   echo   Antimicrobials:   none   Subjective: Feeling ok. denies headaches.  We  discussed diet intervention.   Objective: Vitals:   02/14/19 0432 02/14/19 0441 02/14/19 0632 02/14/19 0727  BP: 129/78 129/78 (!) 143/65 96/70  Pulse:  78  74  Resp:    18  Temp:  98 F (36.7 C)  98.1 F (36.7 C)  TempSrc:  Oral  Oral  SpO2: 95% 95% 94% 95%  Weight:      Height:        Intake/Output Summary (Last 24 hours) at 02/14/2019 1308 Last data filed at 02/14/2019 1225 Gross per 24 hour  Intake 120 ml  Output 1 ml  Net 119 ml   Filed Weights   02/13/19 1729 02/13/19 2156  Weight: 74.8 kg 76 kg    Examination:  General exam: Appears calm and comfortable  Respiratory system: Clear to auscultation. Respiratory effort normal. Cardiovascular system: S1 & S2 heard, RRR. No JVD, murmurs, rubs, gallops or clicks. No pedal edema. Gastrointestinal system: Abdomen is nondistended, soft and nontender. No organomegaly or masses felt. Normal bowel sounds heard. Central nervous system: Alert and oriented. Blurry vision Extremities: Symmetric 5 x 5 power. Skin: No rashes, lesions or ulcers    Data Reviewed: I have personally reviewed following labs and imaging studies  CBC: Recent Labs  Lab 02/13/19 1819  WBC 8.1  NEUTROABS 5.4  HGB 13.3  HCT 42.5  MCV 93.2  PLT 213   Basic Metabolic Panel: Recent Labs  Lab 02/13/19 1819  NA 138  K 4.0  CL 103  CO2 27  GLUCOSE 159*  BUN 20  CREATININE 0.70  CALCIUM 9.4   GFR: Estimated Creatinine Clearance: 66.6  mL/min (by C-G formula based on SCr of 0.7 mg/dL). Liver Function Tests: No results for input(s): AST, ALT, ALKPHOS, BILITOT, PROT, ALBUMIN in the last 168 hours. No results for input(s): LIPASE, AMYLASE in the last 168 hours. No results for input(s): AMMONIA in the last 168 hours. Coagulation Profile: No results for input(s): INR, PROTIME in the last 168 hours. Cardiac Enzymes: No results for input(s): CKTOTAL, CKMB, CKMBINDEX, TROPONINI in the last 168 hours. BNP (last 3 results) No results for input(s):  PROBNP in the last 8760 hours. HbA1C: Recent Labs    02/14/19 0501  HGBA1C 7.3*   CBG: Recent Labs  Lab 02/13/19 1737 02/14/19 0054 02/14/19 0655 02/14/19 0816 02/14/19 1200  GLUCAP 128* 194* 135* 117* 181*   Lipid Profile: Recent Labs    02/14/19 0501  CHOL 206*  HDL 37*  LDLCALC 107*  TRIG 309*  CHOLHDL 5.6   Thyroid Function Tests: No results for input(s): TSH, T4TOTAL, FREET4, T3FREE, THYROIDAB in the last 72 hours. Anemia Panel: No results for input(s): VITAMINB12, FOLATE, FERRITIN, TIBC, IRON, RETICCTPCT in the last 72 hours. Sepsis Labs: No results for input(s): PROCALCITON, LATICACIDVEN in the last 168 hours.  No results found for this or any previous visit (from the past 240 hour(s)).       Radiology Studies: Ct Angio Head W Or Wo Contrast  Result Date: 02/13/2019 CLINICAL DATA:  Initial evaluation for acute right-sided visual loss, no left PCA territory infarct. EXAM: CT ANGIOGRAPHY HEAD AND NECK TECHNIQUE: Multidetector CT imaging of the head and neck was performed using the standard protocol during bolus administration of intravenous contrast. Multiplanar CT image reconstructions and MIPs were obtained to evaluate the vascular anatomy. Carotid stenosis measurements (when applicable) are obtained utilizing NASCET criteria, using the distal internal carotid diameter as the denominator. CONTRAST:  74mL OMNIPAQUE IOHEXOL 350 MG/ML SOLN COMPARISON:  Prior CT from earlier the same day. FINDINGS: CTA NECK FINDINGS Aortic arch: Visualized aortic arch of normal caliber with normal branch pattern. Mild plaque at the origin of the left CCA without flow-limiting stenosis. No hemodynamically significant stenosis about the origin of the great vessels. Visualized subclavian arteries widely patent. Right carotid system: Right common carotid and internal carotid arteries are widely patent without stenosis, dissection, or occlusion. Minor atherosclerotic change about the right  carotid bifurcation without hemodynamically significant stenosis. Left carotid system: Left common and internal carotid arteries widely patent without stenosis, dissection, or occlusion. Minor atherosclerotic change about the left carotid bifurcation without hemodynamically significant stenosis. Vertebral arteries: Both of the vertebral arteries arise from the subclavian arteries. Left vertebral artery dominant. Vertebral arteries widely patent within the neck without stenosis, dissection, or occlusion. Skeleton: No acute osseous finding. No discrete lytic or blastic osseous lesions. Mild cervical spondylolysis at C5-6 and C6-7. Other neck: No other acute soft tissue abnormality within the neck. Diffuse fatty infiltration of the right parotid gland noted. Heterogeneous right thyroid nodule measuring up to approximately 2.8 cm noted, indeterminate (series 5, image 141). Upper chest: Streak artifact from left-sided pacemaker/AICD. Scattered atelectatic changes noted within the visualized lungs. Partially visualized upper chest demonstrates no acute abnormality. Review of the MIP images confirms the above findings CTA HEAD FINDINGS Anterior circulation: Petrous segments widely patent bilaterally. Scattered atheromatous plaque within the cavernous/supraclinoid ICAs with relatively mild diffuse narrowing. ICA termini widely patent. A1 segments patent bilaterally. Right A1 slightly dominant. Normal anterior communicating artery. Anterior cerebral arteries widely patent to their distal aspects without stenosis. No M1 stenosis or occlusion. Distal  MCA branches well perfused and symmetric. Posterior circulation: Vertebral arteries widely patent to the vertebrobasilar junction without stenosis. Left vertebral artery dominant. Posterior inferior cerebral arteries patent bilaterally. Basilar widely patent to its distal aspect without stenosis. Superior cerebral arteries patent bilaterally. Both of the posterior cerebral  arteries primarily supplied via the basilar. PCAs demonstrate moderate atheromatous irregularity bilaterally with multifocal moderate segmental stenoses, left worse than right. PCAs do remain patent to their distal aspects. Venous sinuses: Patent. Anatomic variants: None significant. Delayed phase: Small evolving acute left PCA territory infarct again noted with associated mild leptomeningeal enhancement. No other abnormal enhancement. Review of the MIP images confirms the above findings IMPRESSION: 1. Negative CTA for large vessel occlusion. Wide patency of the major arterial vasculature of the head and neck. 2. Moderate atheromatous irregularity involving the PCAs bilaterally, left greater than right. 3. Additional minor for age atherosclerotic change elsewhere as above. No other hemodynamically significant or correctable stenosis. 4. 2.8 cm heterogeneous right thyroid nodule. Follow-up examination with nonemergent outpatient thyroid ultrasound recommended for further characterization. Electronically Signed   By: Jeannine Boga M.D.   On: 02/13/2019 23:58   Ct Head Wo Contrast  Result Date: 02/14/2019 CLINICAL DATA:  68 y/o  F; stroke for follow-up. EXAM: CT HEAD WITHOUT CONTRAST TECHNIQUE: Contiguous axial images were obtained from the base of the skull through the vertex without intravenous contrast. COMPARISON:  02/13/2019 CT head and CTA head. FINDINGS: Brain: Stable distribution late acute to subacute infarction within the left occipital lobe. No new findings of stroke, hemorrhage, extra-axial collection, hydrocephalus, or mass effect. Stable nonspecific white matter hypodensities are compatible with chronic microvascular ischemic changes. Vascular: Calcific atherosclerosis of carotid siphons and vertebral arteries. Hyperdense vessel. Skull: Normal. Negative for fracture or focal lesion. Sinuses/Orbits: No acute finding. Other: None. IMPRESSION: Stable distribution of late acute to subacute  infarction within the left occipital lobe. No new acute intracranial abnormality identified. Electronically Signed   By: Kristine Garbe M.D.   On: 02/14/2019 13:03   Ct Head Wo Contrast  Result Date: 02/13/2019 CLINICAL DATA:  68 year old female with dizziness EXAM: CT HEAD WITHOUT CONTRAST TECHNIQUE: Contiguous axial images were obtained from the base of the skull through the vertex without intravenous contrast. COMPARISON:  11/03/2011 FINDINGS: Brain: No acute intracranial hemorrhage. No midline shift or mass effect. Focal hypodensity in the left occipital lobe, not present on the comparison. Vascular: Calcifications of the anterior circulation. Skull: No acute fracture.  No aggressive bone lesion identified. Sinuses/Orbits: Unremarkable appearance of the orbits. Mastoid air cells clear. No middle ear effusion. No significant sinus disease. Other: None IMPRESSION: New focal hypodensity in the left occipital lobe. This most likely represents a completed infarction in the left PCA distribution, however, neoplasm can not be excluded. Contrast-enhanced MRI is recommended, as well as neurology consultation. Electronically Signed   By: Corrie Mckusick D.O.   On: 02/13/2019 15:52   Ct Head W Contrast  Result Date: 02/13/2019 CLINICAL DATA:  Dizziness. Left occipital lesion on previous noncontrast study. EXAM: CT HEAD WITH CONTRAST TECHNIQUE: Contiguous axial images were obtained from the base of the skull through the vertex with intravenous contrast. CONTRAST:  80mL OMNIPAQUE IOHEXOL 300 MG/ML  SOLN COMPARISON:  Earlier same day. FINDINGS: Brain: Following contrast administration, there is no finding to suggest the presence of tumor in the left occipital lobe. The area of low-density looks like a subacute stroke. The remainder the brain appears normal. No mass, hemorrhage, hydrocephalus or extra-axial collection. Vascular: Major vessels at  the base of the brain show flow. Skull: Negative Sinuses/Orbits:  Clear/normal Other: None IMPRESSION: After contrast administration, the left occipital low-density shows findings consistent with an acute/subacute occipital stroke. There is no finding to suggest the presence of an underlying mass lesion. These results will be called to the ordering clinician or representative by the Radiologist Assistant, and communication documented in the PACS or zVision Dashboard. Electronically Signed   By: Nelson Chimes M.D.   On: 02/13/2019 17:08   Ct Angio Neck W And/or Wo Contrast  Result Date: 02/13/2019 CLINICAL DATA:  Initial evaluation for acute right-sided visual loss, no left PCA territory infarct. EXAM: CT ANGIOGRAPHY HEAD AND NECK TECHNIQUE: Multidetector CT imaging of the head and neck was performed using the standard protocol during bolus administration of intravenous contrast. Multiplanar CT image reconstructions and MIPs were obtained to evaluate the vascular anatomy. Carotid stenosis measurements (when applicable) are obtained utilizing NASCET criteria, using the distal internal carotid diameter as the denominator. CONTRAST:  58mL OMNIPAQUE IOHEXOL 350 MG/ML SOLN COMPARISON:  Prior CT from earlier the same day. FINDINGS: CTA NECK FINDINGS Aortic arch: Visualized aortic arch of normal caliber with normal branch pattern. Mild plaque at the origin of the left CCA without flow-limiting stenosis. No hemodynamically significant stenosis about the origin of the great vessels. Visualized subclavian arteries widely patent. Right carotid system: Right common carotid and internal carotid arteries are widely patent without stenosis, dissection, or occlusion. Minor atherosclerotic change about the right carotid bifurcation without hemodynamically significant stenosis. Left carotid system: Left common and internal carotid arteries widely patent without stenosis, dissection, or occlusion. Minor atherosclerotic change about the left carotid bifurcation without hemodynamically significant  stenosis. Vertebral arteries: Both of the vertebral arteries arise from the subclavian arteries. Left vertebral artery dominant. Vertebral arteries widely patent within the neck without stenosis, dissection, or occlusion. Skeleton: No acute osseous finding. No discrete lytic or blastic osseous lesions. Mild cervical spondylolysis at C5-6 and C6-7. Other neck: No other acute soft tissue abnormality within the neck. Diffuse fatty infiltration of the right parotid gland noted. Heterogeneous right thyroid nodule measuring up to approximately 2.8 cm noted, indeterminate (series 5, image 141). Upper chest: Streak artifact from left-sided pacemaker/AICD. Scattered atelectatic changes noted within the visualized lungs. Partially visualized upper chest demonstrates no acute abnormality. Review of the MIP images confirms the above findings CTA HEAD FINDINGS Anterior circulation: Petrous segments widely patent bilaterally. Scattered atheromatous plaque within the cavernous/supraclinoid ICAs with relatively mild diffuse narrowing. ICA termini widely patent. A1 segments patent bilaterally. Right A1 slightly dominant. Normal anterior communicating artery. Anterior cerebral arteries widely patent to their distal aspects without stenosis. No M1 stenosis or occlusion. Distal MCA branches well perfused and symmetric. Posterior circulation: Vertebral arteries widely patent to the vertebrobasilar junction without stenosis. Left vertebral artery dominant. Posterior inferior cerebral arteries patent bilaterally. Basilar widely patent to its distal aspect without stenosis. Superior cerebral arteries patent bilaterally. Both of the posterior cerebral arteries primarily supplied via the basilar. PCAs demonstrate moderate atheromatous irregularity bilaterally with multifocal moderate segmental stenoses, left worse than right. PCAs do remain patent to their distal aspects. Venous sinuses: Patent. Anatomic variants: None significant. Delayed  phase: Small evolving acute left PCA territory infarct again noted with associated mild leptomeningeal enhancement. No other abnormal enhancement. Review of the MIP images confirms the above findings IMPRESSION: 1. Negative CTA for large vessel occlusion. Wide patency of the major arterial vasculature of the head and neck. 2. Moderate atheromatous irregularity involving the PCAs bilaterally, left  greater than right. 3. Additional minor for age atherosclerotic change elsewhere as above. No other hemodynamically significant or correctable stenosis. 4. 2.8 cm heterogeneous right thyroid nodule. Follow-up examination with nonemergent outpatient thyroid ultrasound recommended for further characterization. Electronically Signed   By: Jeannine Boga M.D.   On: 02/13/2019 23:58        Scheduled Meds: . enoxaparin (LOVENOX) injection  40 mg Subcutaneous Daily  . insulin aspart  0-5 Units Subcutaneous QHS  . insulin aspart  0-9 Units Subcutaneous TID WC  . linagliptin  5 mg Oral Daily  . pantoprazole  40 mg Oral Daily  . sertraline  50 mg Oral Daily   Continuous Infusions: . sodium chloride 100 mL/hr at 02/14/19 1045     LOS: 1 day    Time spent: 35 minutes.     Elmarie Shiley, MD [facility]  02/14/2019, 1:08 PM

## 2019-02-14 NOTE — Evaluation (Addendum)
Occupational Therapy Evaluation Patient Details Name: [name] MRN: [id number] DOB: 1951/01/07 Today's Date: 02/14/2019    History of Present Illness Pt is a 68 y/o female admitted secondary to vision changes in her R eye. MRI of the brain revealed an acute/subacute occipital stroke. PMH including but not limited to HTN, HLD and pacemaker.   Clinical Impression   This 68 y/o female presents with the above. At baseline pt is independent with ADL, iADL and functional mobility. Pt completing functional mobility today without AD and overall supervision, demonstrates standing grooming ADL, LB ADL with supervision as well. Pt reports continued impairments in R visual field including intermittent blurriness and floaters. Discussed and educated pt on compensatory techniques to utilize during mobility and functional tasks given current deficits. Will continue to follow while she remains in acute setting and have recommended outpt neuro OT to further address pt visual deficits. Will follow.     Follow Up Recommendations  Outpatient OT(outpt neuro, if pt continues to have visual deficits)    Equipment Recommendations  None recommended by OT           Precautions / Restrictions Precautions Precautions: None Restrictions Weight Bearing Restrictions: No      Mobility Bed Mobility               General bed mobility comments: pt OOB in recliner chair upon arrival  Transfers Overall transfer level: Modified independent Equipment used: None                  Balance Overall balance assessment: Needs assistance Sitting-balance support: Feet supported Sitting balance-Leahy Scale: Normal     Standing balance support: No upper extremity supported Standing balance-Leahy Scale: Good                             ADL either performed or assessed with clinical judgement   ADL Overall ADL's : Needs assistance/impaired Eating/Feeding: Independent;Sitting    Grooming: Wash/dry face;Supervision/safety;Standing   Upper Body Bathing: Modified independent;Sitting   Lower Body Bathing: Supervison/ safety;Sit to/from stand   Upper Body Dressing : Modified independent;Sitting   Lower Body Dressing: Supervision/safety;Sit to/from stand   Toilet Transfer: Supervision/safety;Ambulation   Toileting- Clothing Manipulation and Hygiene: Supervision/safety;Sit to/from stand       Functional mobility during ADLs: Supervision/safety General ADL Comments: pt likely close to baseline for ADL and functional transfers, discussed additional compensatory strategies for increasing ease of completing functional tasks given visual deficits     Vision Baseline Vision/History: Wears glasses Wears Glasses: At all times Patient Visual Report: Blurring of vision;Other (comment)(floaters/squiggly lines in R visual field) Vision Assessment?: Yes Eye Alignment: Within Functional Limits Ocular Range of Motion: Within Functional Limits Alignment/Gaze Preference: Within Defined Limits Tracking/Visual Pursuits: Able to track stimulus in all quads without difficulty Saccades: Additional eye shifts occurred during testing Visual Fields: Other (comment);No apparent deficits(to be further assessed)     Perception     Praxis      Pertinent Vitals/Pain Pain Assessment: No/denies pain     Hand Dominance     Extremity/Trunk Assessment Upper Extremity Assessment Upper Extremity Assessment: Overall WFL for tasks assessed   Lower Extremity Assessment Lower Extremity Assessment: Defer to PT evaluation   Cervical / Trunk Assessment Cervical / Trunk Assessment: Normal   Communication Communication Communication: No difficulties   Cognition Arousal/Alertness: Awake/alert Behavior During Therapy: WFL for tasks assessed/performed Overall Cognitive Status: Within Functional Limits for tasks assessed  General  Comments       Exercises     Shoulder Instructions      Home Living Family/patient expects to be discharged to:: Private residence Living Arrangements: Spouse/significant other Available Help at Discharge: Family;Available 24 hours/day Type of Home: House Home Access: Stairs to enter [employer] of Steps: 2 Entrance Stairs-Rails: Right;Left Home Layout: One level     Bathroom Shower/Tub: Teacher, early years/pre: Standard     Home Equipment: None          Prior Functioning/Environment Level of Independence: Independent        Comments: driving, retired        Secretary/administrator Problem List: Impaired vision/perception;Decreased activity tolerance      OT Treatment/Interventions: Self-care/ADL training;Therapeutic exercise;Visual/perceptual remediation/compensation;Therapeutic activities;Patient/family education    OT Goals(Current goals can be found in the care plan section) Acute Rehab OT Goals Patient Stated Goal: "go home soon" OT Goal Formulation: With patient Time For Goal Achievement: 02/28/19 Potential to Achieve Goals: Good  OT Frequency: Min 2X/week   Barriers to D/C:            Co-evaluation              AM-PAC OT "6 Clicks" Daily Activity     Outcome Measure Help from another person eating meals?: None Help from another person taking care of personal grooming?: None Help from another person toileting, which includes using toliet, bedpan, or urinal?: None Help from another person bathing (including washing, rinsing, drying)?: None Help from another person to put on and taking off regular upper body clothing?: None Help from another person to put on and taking off regular lower body clothing?: None 6 Click Score: 24   End of Session Nurse Communication: Mobility status  Activity Tolerance: Patient tolerated treatment well Patient left: in chair;with call bell/phone within reach  OT Visit Diagnosis: Other symptoms and signs  involving the nervous system (Y60.600)                Time: 4599-7741 OT Time Calculation (min): 18 min Charges:  OT General Charges $OT Visit: 1 Visit OT Evaluation $OT Eval Moderate Complexity: 1 Mod  Lou Cal, OT [employer] Pager [phone] Office [phone]  Raymondo Band 02/14/2019, 1:14 PM

## 2019-02-14 NOTE — Consult Note (Addendum)
NEURO HOSPITALIST  CONSULT   Requesting Physician: Dr. Tyrell Antonio    Chief Complaint: stroke  History obtained from:  Patient     HPI:                                                                                                                                         [name] is an 68 y.o. female PMH pacemaker ( for bradycardia), DM2, HTN, HLD, uterine cancer who presented to [facility] ED on 02/13/2019 for c/o visual field blurring in the right eye, and light headed feeling as well as pain in the back of her head since Monday 02/09/2019. Patient transferred to [facility] for Neurology d/t stroke.  Patient stated that her symptoms started Monday morning  While she was sitting down for breakfast. She states that she felt a sharp pain run up the back of her neck and head and then  she felt light headed and had and had a HA. She took advil, celebrex and baby aspirin and rested. She continued to not feel well the rest of Monday and Tuesday. 02/13/2019 she went to the eye doctor and to her PCP. PCP referred her to the ED for further evaluation.  Denies any slurring of speech, focal weakness , facial droop, numbness or tingling.   Hospital course:  CTH: new focal hypodensity in the left occipital lobe. CTA: no LVO MRI: pending HgbA1C: 7.3 LDL: 107 BP: 96/70   Date last known well: 02/09/2019 Time last known well: unknown tPA Given: No: outside of window Modified Rankin: Rankin Score=0 NIHSS:1;  field cut   Past Medical History:  Diagnosis Date  . Anxiety   . Arthritis    of the neck  . Colon polyps   . Esophageal stricture   . GERD (gastroesophageal reflux disease)    Hiatal Hernia  . History of uterine cancer   . Hyperlipidemia   . Hypertension   . Osteoporosis   . Pacemaker   . Symptomatic bradycardia     Past Surgical History:  Procedure Laterality Date  . APPENDECTOMY    . CHOLECYSTECTOMY    . LAPAROSCOPIC TOTAL HYSTERECTOMY     . LYMPH NODE DISSECTION     bilateral pelvic and right-sided periaortic  . PACEMAKER GENERATOR CHANGE N/A 05/21/2014   Procedure: PACEMAKER GENERATOR CHANGE;  Surgeon: Evans Lance, MD;  Location: [facility] CATH LAB;  Service: Cardiovascular;  Laterality: N/A;  . status post pacemaker     metronic [id number]  . TRANSTHORACIC ECHOCARDIOGRAM  08/30/04  . TUBAL LIGATION      Family History  Problem Relation Age of Onset  .  Heart disease Mother   . Heart attack Mother   . Heart disease Father   . Heart attack Father   . Cancer Sister        breast  . Alcohol abuse Brother   . COPD Sister   . Healthy Daughter    Social History:  reports that she has never smoked. She has never used smokeless tobacco. She reports that she does not drink alcohol or use drugs.  Allergies:  Allergies  Allergen Reactions  . Crestor [Rosuvastatin] Other (See Comments)    Myalgias   . Pravachol [Pravastatin Sodium] Other (See Comments)    Stomach pain and constipation     Medications:                                                                                                                           Scheduled: . enoxaparin (LOVENOX) injection  40 mg Subcutaneous Daily  . insulin aspart  0-5 Units Subcutaneous QHS  . insulin aspart  0-9 Units Subcutaneous TID WC  . linagliptin  5 mg Oral Daily  . pantoprazole  40 mg Oral Daily  . sertraline  50 mg Oral Daily   Continuous: . sodium chloride     DJS:HFWYOVZCHYIFO **OR** acetaminophen (TYLENOL) oral liquid 160 mg/5 mL **OR** acetaminophen, senna-docusate   ROS:                                                                                                                                       ROS was performed and is negative except as noted in HPI   General Examination:                                                                                                      Blood pressure 96/70, pulse 74, temperature 98.1 F (36.7 C),  temperature source Oral, resp. rate 18, height 5\' 3"  (1.6 m), weight 76 kg, SpO2 95 %.  HEENT-  Normocephalic, no  lesions, without obvious abnormality.  Normal external eye and conjunctiva. Cardiovascular- S1-S2 audible, pulses palpable throughout  Lungs-no rhonchi or wheezing noted, no excessive working breathing.  Saturations within normal limits on RA Extremities- Warm, dry and intact Musculoskeletal-no joint tenderness, deformity or swelling Skin-warm and dry, intact  Neurological Examination Mental Status: Alert, oriented to name/age/place/month/year, thought content appropriate. Naming and repetition intact.  Speech fluent without evidence of aphasia.  Able to follow  commands without difficulty. Cranial Nerves: II: right homonymous inferior quadrantanopsia III,IV, VI: ptosis not present, extra-ocular motions intact bilaterally, pupils equal, round, reactive to light and accommodation V,VII: smile symmetric, facial light touch sensation normal bilaterally VIII: hearing normal bilaterally IX,X: uvula rises midline XI: bilateral shoulder shrug XII: midline tongue extension Motor: Right : Upper extremity   5/5  Left:     Upper extremity   5/5  Lower extremity   5/5   Lower extremity   5/5 Tone and bulk:normal tone throughout; no atrophy noted Sensory:light touch intact throughout, bilaterally Plantars: Right: downgoing   Left: downgoing Cerebellar: normal finger-to-nose, and normal heel-to-shin test Gait: deferred   Lab Results: Basic Metabolic Panel: Recent Labs  Lab 02/13/19 1819  NA 138  K 4.0  CL 103  CO2 27  GLUCOSE 159*  BUN 20  CREATININE 0.70  CALCIUM 9.4    CBC: Recent Labs  Lab 02/13/19 1819  WBC 8.1  NEUTROABS 5.4  HGB 13.3  HCT 42.5  MCV 93.2  PLT 202    Lipid Panel: Recent Labs  Lab 02/14/19 0501  CHOL 206*  TRIG 309*  HDL 37*  CHOLHDL 5.6  VLDL 62*  LDLCALC 107*    CBG: Recent Labs  Lab 02/13/19 1737 02/14/19 0054  02/14/19 0655 02/14/19 0816  GLUCAP 128* 194* 135* 117*    Imaging: Ct Angio Head W Or Wo Contrast  Result Date: 02/13/2019 CLINICAL DATA:  Initial evaluation for acute right-sided visual loss, no left PCA territory infarct. EXAM: CT ANGIOGRAPHY HEAD AND NECK TECHNIQUE: Multidetector CT imaging of the head and neck was performed using the standard protocol during bolus administration of intravenous contrast. Multiplanar CT image reconstructions and MIPs were obtained to evaluate the vascular anatomy. Carotid stenosis measurements (when applicable) are obtained utilizing NASCET criteria, using the distal internal carotid diameter as the denominator. CONTRAST:  79mL OMNIPAQUE IOHEXOL 350 MG/ML SOLN COMPARISON:  Prior CT from earlier the same day. FINDINGS: CTA NECK FINDINGS Aortic arch: Visualized aortic arch of normal caliber with normal branch pattern. Mild plaque at the origin of the left CCA without flow-limiting stenosis. No hemodynamically significant stenosis about the origin of the great vessels. Visualized subclavian arteries widely patent. Right carotid system: Right common carotid and internal carotid arteries are widely patent without stenosis, dissection, or occlusion. Minor atherosclerotic change about the right carotid bifurcation without hemodynamically significant stenosis. Left carotid system: Left common and internal carotid arteries widely patent without stenosis, dissection, or occlusion. Minor atherosclerotic change about the left carotid bifurcation without hemodynamically significant stenosis. Vertebral arteries: Both of the vertebral arteries arise from the subclavian arteries. Left vertebral artery dominant. Vertebral arteries widely patent within the neck without stenosis, dissection, or occlusion. Skeleton: No acute osseous finding. No discrete lytic or blastic osseous lesions. Mild cervical spondylolysis at C5-6 and C6-7. Other neck: No other acute soft tissue abnormality within  the neck. Diffuse fatty infiltration of the right parotid gland noted. Heterogeneous right thyroid nodule measuring up to approximately 2.8 cm noted, indeterminate (series 5, image 141). Upper chest:  Streak artifact from left-sided pacemaker/AICD. Scattered atelectatic changes noted within the visualized lungs. Partially visualized upper chest demonstrates no acute abnormality. Review of the MIP images confirms the above findings CTA HEAD FINDINGS Anterior circulation: Petrous segments widely patent bilaterally. Scattered atheromatous plaque within the cavernous/supraclinoid ICAs with relatively mild diffuse narrowing. ICA termini widely patent. A1 segments patent bilaterally. Right A1 slightly dominant. Normal anterior communicating artery. Anterior cerebral arteries widely patent to their distal aspects without stenosis. No M1 stenosis or occlusion. Distal MCA branches well perfused and symmetric. Posterior circulation: Vertebral arteries widely patent to the vertebrobasilar junction without stenosis. Left vertebral artery dominant. Posterior inferior cerebral arteries patent bilaterally. Basilar widely patent to its distal aspect without stenosis. Superior cerebral arteries patent bilaterally. Both of the posterior cerebral arteries primarily supplied via the basilar. PCAs demonstrate moderate atheromatous irregularity bilaterally with multifocal moderate segmental stenoses, left worse than right. PCAs do remain patent to their distal aspects. Venous sinuses: Patent. Anatomic variants: None significant. Delayed phase: Small evolving acute left PCA territory infarct again noted with associated mild leptomeningeal enhancement. No other abnormal enhancement. Review of the MIP images confirms the above findings IMPRESSION: 1. Negative CTA for large vessel occlusion. Wide patency of the major arterial vasculature of the head and neck. 2. Moderate atheromatous irregularity involving the PCAs bilaterally, left greater  than right. 3. Additional minor for age atherosclerotic change elsewhere as above. No other hemodynamically significant or correctable stenosis. 4. 2.8 cm heterogeneous right thyroid nodule. Follow-up examination with nonemergent outpatient thyroid ultrasound recommended for further characterization. Electronically Signed   By: Jeannine Boga M.D.   On: 02/13/2019 23:58   Ct Head Wo Contrast  Result Date: 02/13/2019 CLINICAL DATA:  68 year old female with dizziness EXAM: CT HEAD WITHOUT CONTRAST TECHNIQUE: Contiguous axial images were obtained from the base of the skull through the vertex without intravenous contrast. COMPARISON:  11/03/2011 FINDINGS: Brain: No acute intracranial hemorrhage. No midline shift or mass effect. Focal hypodensity in the left occipital lobe, not present on the comparison. Vascular: Calcifications of the anterior circulation. Skull: No acute fracture.  No aggressive bone lesion identified. Sinuses/Orbits: Unremarkable appearance of the orbits. Mastoid air cells clear. No middle ear effusion. No significant sinus disease. Other: None IMPRESSION: New focal hypodensity in the left occipital lobe. This most likely represents a completed infarction in the left PCA distribution, however, neoplasm can not be excluded. Contrast-enhanced MRI is recommended, as well as neurology consultation. Electronically Signed   By: Corrie Mckusick D.O.   On: 02/13/2019 15:52   Ct Head W Contrast  Result Date: 02/13/2019 CLINICAL DATA:  Dizziness. Left occipital lesion on previous noncontrast study. EXAM: CT HEAD WITH CONTRAST TECHNIQUE: Contiguous axial images were obtained from the base of the skull through the vertex with intravenous contrast. CONTRAST:  78mL OMNIPAQUE IOHEXOL 300 MG/ML  SOLN COMPARISON:  Earlier same day. FINDINGS: Brain: Following contrast administration, there is no finding to suggest the presence of tumor in the left occipital lobe. The area of low-density looks like a  subacute stroke. The remainder the brain appears normal. No mass, hemorrhage, hydrocephalus or extra-axial collection. Vascular: Major vessels at the base of the brain show flow. Skull: Negative Sinuses/Orbits: Clear/normal Other: None IMPRESSION: After contrast administration, the left occipital low-density shows findings consistent with an acute/subacute occipital stroke. There is no finding to suggest the presence of an underlying mass lesion. These results will be called to the ordering clinician or representative by the Radiologist Assistant, and communication documented in the PACS  or zVision Dashboard. Electronically Signed   By: Nelson Chimes M.D.   On: 02/13/2019 17:08   Ct Angio Neck W And/or Wo Contrast  Result Date: 02/13/2019 CLINICAL DATA:  Initial evaluation for acute right-sided visual loss, no left PCA territory infarct. EXAM: CT ANGIOGRAPHY HEAD AND NECK TECHNIQUE: Multidetector CT imaging of the head and neck was performed using the standard protocol during bolus administration of intravenous contrast. Multiplanar CT image reconstructions and MIPs were obtained to evaluate the vascular anatomy. Carotid stenosis measurements (when applicable) are obtained utilizing NASCET criteria, using the distal internal carotid diameter as the denominator. CONTRAST:  38mL OMNIPAQUE IOHEXOL 350 MG/ML SOLN COMPARISON:  Prior CT from earlier the same day. FINDINGS: CTA NECK FINDINGS Aortic arch: Visualized aortic arch of normal caliber with normal branch pattern. Mild plaque at the origin of the left CCA without flow-limiting stenosis. No hemodynamically significant stenosis about the origin of the great vessels. Visualized subclavian arteries widely patent. Right carotid system: Right common carotid and internal carotid arteries are widely patent without stenosis, dissection, or occlusion. Minor atherosclerotic change about the right carotid bifurcation without hemodynamically significant stenosis. Left  carotid system: Left common and internal carotid arteries widely patent without stenosis, dissection, or occlusion. Minor atherosclerotic change about the left carotid bifurcation without hemodynamically significant stenosis. Vertebral arteries: Both of the vertebral arteries arise from the subclavian arteries. Left vertebral artery dominant. Vertebral arteries widely patent within the neck without stenosis, dissection, or occlusion. Skeleton: No acute osseous finding. No discrete lytic or blastic osseous lesions. Mild cervical spondylolysis at C5-6 and C6-7. Other neck: No other acute soft tissue abnormality within the neck. Diffuse fatty infiltration of the right parotid gland noted. Heterogeneous right thyroid nodule measuring up to approximately 2.8 cm noted, indeterminate (series 5, image 141). Upper chest: Streak artifact from left-sided pacemaker/AICD. Scattered atelectatic changes noted within the visualized lungs. Partially visualized upper chest demonstrates no acute abnormality. Review of the MIP images confirms the above findings CTA HEAD FINDINGS Anterior circulation: Petrous segments widely patent bilaterally. Scattered atheromatous plaque within the cavernous/supraclinoid ICAs with relatively mild diffuse narrowing. ICA termini widely patent. A1 segments patent bilaterally. Right A1 slightly dominant. Normal anterior communicating artery. Anterior cerebral arteries widely patent to their distal aspects without stenosis. No M1 stenosis or occlusion. Distal MCA branches well perfused and symmetric. Posterior circulation: Vertebral arteries widely patent to the vertebrobasilar junction without stenosis. Left vertebral artery dominant. Posterior inferior cerebral arteries patent bilaterally. Basilar widely patent to its distal aspect without stenosis. Superior cerebral arteries patent bilaterally. Both of the posterior cerebral arteries primarily supplied via the basilar. PCAs demonstrate moderate  atheromatous irregularity bilaterally with multifocal moderate segmental stenoses, left worse than right. PCAs do remain patent to their distal aspects. Venous sinuses: Patent. Anatomic variants: None significant. Delayed phase: Small evolving acute left PCA territory infarct again noted with associated mild leptomeningeal enhancement. No other abnormal enhancement. Review of the MIP images confirms the above findings IMPRESSION: 1. Negative CTA for large vessel occlusion. Wide patency of the major arterial vasculature of the head and neck. 2. Moderate atheromatous irregularity involving the PCAs bilaterally, left greater than right. 3. Additional minor for age atherosclerotic change elsewhere as above. No other hemodynamically significant or correctable stenosis. 4. 2.8 cm heterogeneous right thyroid nodule. Follow-up examination with nonemergent outpatient thyroid ultrasound recommended for further characterization. Electronically Signed   By: Jeannine Boga M.D.   On: 02/13/2019 23:58     Laurey Morale, MSN, NP-C [facility] [phone]  02/14/2019, 10:29 AM   Attending physician note to follow with Assessment and plan .  Attending Neurohospitalist Addendum Patient seen and examined with APP/Resident. Agree with the history and physical as documented above.     Assessment: 68 y.o. female PMH pacemaker ( for bradycardia), DM2, HTN, HLD, uterine cancer who presented to [facility] ED on 02/13/2019 for c/o visual field blurring in the right eye, and light headed feeling as well as pain in the back of her head since Monday 02/09/2019. Patient transferred to [facility] for Neurology d/t stroke. CTH: no hemorrhage; acute/subacute left occipital lobe infarction. CTA: no LVO. TPA: not given d/ t outside of window. 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 Needs further w/u   Recommendations: -- BP goal : Permissive HTN upto 220/110  mmHg --MRI Brain or repeat head CT 24 hours ; if can not obtain MRI d/t pacemaker --CTA (completed) --Echocardiogram -- ASA -- High intensity Statin if LDL > 70  -- HgbA1c, fasting lipid panel -- PT consult, OT consult, Speech consult --Telemetry monitoring --Frequent neuro checks --Stroke swallow screen   Agree with the plan as documented, which I helped formulate. I have independently reviewed the chart, obtained history, review of systems and examined the patient.I have personally reviewed pertinent head/neck/spine imaging (CT/MRI). Please feel free to call with any questions.   --please page stroke NP  Or  PA  Or MD from 8am -4 pm  as this patient from this time will be  followed by the stroke.   You can look them up on [email or web address]  Password [email or web address]  --- Amie Portland, MD [facility] Pager: [phone]  If 7pm to 7am, please call on call as listed on AMION.

## 2019-02-14 NOTE — Progress Notes (Signed)
  Echocardiogram 2D Echocardiogram has been performed.  [name] 02/14/2019, 12:18 PM

## 2019-02-14 NOTE — Evaluation (Signed)
Physical Therapy Evaluation Patient Details Name: [name] MRN: [id number] DOB: Sep 11, 1951 Today's Date: 02/14/2019   History of Present Illness  Pt is a 68 y/o female admitted secondary to vision changes in her R eye. MRI of the brain revealed an acute/subacute occipital stroke. PMH including but not limited to HTN, HLD and pacemaker.    Clinical Impression  Pt presented sitting OOB in recliner chair, awake and willing to participate in therapy session. Prior to admission, pt reported that she was independent with all functional mobility and ADLs. Pt lives with her boyfriend in a single level house with two steps to enter. She is currently at supervision to modified independence level with all functional mobility including stair training. Pt participated in a higher level balance assessment and scored a 23/24 on the DGI indicating that she is a safe community ambulator. Pt with no further acute PT needs identified. PT signing off.     Follow Up Recommendations No PT follow up    Equipment Recommendations  None recommended by PT    Recommendations for Other Services       Precautions / Restrictions Precautions Precautions: None Restrictions Weight Bearing Restrictions: No      Mobility  Bed Mobility               General bed mobility comments: pt OOB in recliner chair upon arrival  Transfers Overall transfer level: Modified independent Equipment used: None                Ambulation/Gait Ambulation/Gait assistance: Supervision Gait Distance (Feet): 200 Feet Assistive device: None Gait Pattern/deviations: Step-through pattern Gait velocity: able to fluctuate   General Gait Details: pt with no instability or LOB, supervision for safety  Stairs Stairs: Yes Stairs assistance: Supervision Stair Management: One rail Right;Alternating pattern;Forwards Number of Stairs: 2    Wheelchair Mobility    Modified Rankin (Stroke Patients Only) Modified Rankin  (Stroke Patients Only) Pre-Morbid Rankin Score: No symptoms Modified Rankin: No significant disability     Balance Overall balance assessment: Needs assistance Sitting-balance support: Feet supported Sitting balance-Leahy Scale: Normal     Standing balance support: No upper extremity supported Standing balance-Leahy Scale: Good                   Standardized Balance Assessment Standardized Balance Assessment : Dynamic Gait Index   Dynamic Gait Index Level Surface: Normal Change in Gait Speed: Normal Gait with Horizontal Head Turns: Normal Gait with Vertical Head Turns: Normal Gait and Pivot Turn: Normal Step Over Obstacle: Normal Step Around Obstacles: Normal Steps: Mild Impairment Total Score: 23       Pertinent Vitals/Pain Pain Assessment: No/denies pain    Home Living Family/patient expects to be discharged to:: Private residence Living Arrangements: Spouse/significant other Available Help at Discharge: Family;Available 24 hours/day Type of Home: House Home Access: Stairs to enter Entrance Stairs-Rails: Psychiatric nurse of Steps: 2 Home Layout: One level Home Equipment: None      Prior Function Level of Independence: Independent               Hand Dominance        Extremity/Trunk Assessment   Upper Extremity Assessment Upper Extremity Assessment: Defer to OT evaluation;Overall [facility] for tasks assessed    Lower Extremity Assessment Lower Extremity Assessment: Overall WFL for tasks assessed    Cervical / Trunk Assessment Cervical / Trunk Assessment: Normal  Communication   Communication: No difficulties  Cognition Arousal/Alertness: Awake/alert Behavior During Therapy:  WFL for tasks assessed/performed Overall Cognitive Status: Within Functional Limits for tasks assessed                                        General Comments      Exercises     Assessment/Plan    PT Assessment Patent does not  need any further PT services  PT Problem List         PT Treatment Interventions      PT Goals (Current goals can be found in the Care Plan section)  Acute Rehab PT Goals Patient Stated Goal: "go home soon" PT Goal Formulation: All assessment and education complete, DC therapy    Frequency     Barriers to discharge        Co-evaluation               AM-PAC PT "6 Clicks" Mobility  Outcome Measure Help needed turning from your back to your side while in a flat bed without using bedrails?: None Help needed moving from lying on your back to sitting on the side of a flat bed without using bedrails?: None Help needed moving to and from a bed to a chair (including a wheelchair)?: None Help needed standing up from a chair using your arms (e.g., wheelchair or bedside chair)?: None Help needed to walk in hospital room?: None Help needed climbing 3-5 steps with a railing? : None 6 Click Score: 24    End of Session Equipment Utilized During Treatment: Gait belt Activity Tolerance: Patient tolerated treatment well Patient left: in chair;with call bell/phone within reach Nurse Communication: Mobility status PT Visit Diagnosis: Other symptoms and signs involving the nervous system (R29.898)    Time: 6294-7654 PT Time Calculation (min) (ACUTE ONLY): 16 min   Charges:   PT Evaluation $PT Eval Moderate Complexity: 1 Mod          Sherie Don, PT, DPT  Acute Rehabilitation Services Pager [phone] Office [place] 02/14/2019, 10:14 AM

## 2019-02-15 LAB — GLUCOSE, CAPILLARY
GLUCOSE-CAPILLARY: 135 mg/dL — AB (ref 70–99)
Glucose-Capillary: 218 mg/dL — ABNORMAL HIGH (ref 70–99)

## 2019-02-15 MED ORDER — CLOPIDOGREL BISULFATE 75 MG PO TABS
75.0000 mg | ORAL_TABLET | Freq: Every day | ORAL | Status: DC
  Administered 2019-02-15: 75 mg via ORAL
  Filled 2019-02-15: qty 1

## 2019-02-15 MED ORDER — ASPIRIN EC 81 MG PO TBEC: 81.0000 mg | DELAYED_RELEASE_TABLET | Freq: Every day | ORAL | Status: DC

## 2019-02-15 MED ORDER — CLOPIDOGREL BISULFATE 75 MG PO TABS: 75.0000 mg | ORAL_TABLET | Freq: Every day | ORAL | 0 refills | Status: DC

## 2019-02-15 MED ORDER — ROSUVASTATIN CALCIUM 5 MG PO TABS: 5.0000 mg | ORAL_TABLET | Freq: Every day | ORAL | 0 refills | Status: DC

## 2019-02-15 MED ORDER — ASPIRIN 81 MG PO TBEC: 81.0000 mg | DELAYED_RELEASE_TABLET | Freq: Every day | ORAL | 0 refills | Status: DC

## 2019-02-15 NOTE — Progress Notes (Signed)
AVS was reviewed with patient and her family and a copy was given to her to take home. Patient dressed, belongings packed, and all lines removed. Patient was transported via wheelchair to boyfriend's car for discharge.

## 2019-02-15 NOTE — Progress Notes (Signed)
STROKE TEAM PROGRESS NOTE   HISTORY OF PRESENT ILLNESS (per record) [name] is an 68 y.o. female PMH pacemaker ( for bradycardia), DM2, HTN, HLD, uterine cancer who presented to [facility] ED on 02/13/2019 for c/o visual field blurring in the right eye, and light headed feeling as well as pain in the back of her head since Monday 02/09/2019. Patient transferred to [facility] for Neurology d/t stroke.  Patient stated that her symptoms started Monday morning  While she was sitting down for breakfast. She states that she felt a sharp pain run up the back of her neck and head and then  she felt light headed and had and had a HA. She took advil, celebrex and baby aspirin and rested. She continued to not feel well the rest of Monday and Tuesday. 02/13/2019 she went to the eye doctor and to her PCP. PCP referred her to the ED for further evaluation.  Denies any slurring of speech, focal weakness , facial droop, numbness or tingling.  Hospital course:   CTH: new focal hypodensity in the left occipital lobe. CTA: no LVO MRI: pending HgbA1C: 7.3 LDL: 107 BP: 96/70  Date last known well: 02/09/2019 Time last known well: unknown tPA Given: No: outside of window Modified Rankin: Rankin Score=0 NIHSS:1;  field cut   SUBJECTIVE (INTERVAL HISTORY) Her family is not at bedside. Patient states not on ASA outpatient. Symptoms resolved.    OBJECTIVE Vitals:   02/14/19 1631 02/14/19 2018 02/14/19 2348 02/15/19 0350  BP: 131/71 (!) 146/76 133/66 (!) 149/80  Pulse: 80 79 80 75  Resp: 20 18 18 18   Temp: 98 F (36.7 C) 98.2 F (36.8 C) 98.1 F (36.7 C) 98.3 F (36.8 C)  TempSrc: Oral Oral Oral Oral  SpO2: 98% 99% 99% 99%  Weight:      Height:        CBC:  Recent Labs  Lab 02/13/19 1819  WBC 8.1  NEUTROABS 5.4  HGB 13.3  HCT 42.5  MCV 93.2  PLT 517    Basic Metabolic Panel:  Recent Labs  Lab 02/13/19 1819  NA 138  K 4.0  CL 103  CO2 27  GLUCOSE 159*  BUN 20  CREATININE 0.70   CALCIUM 9.4    Lipid Panel:     Component Value Date/Time   CHOL 206 (H) 02/14/2019 0501   CHOL 221 (H) 10/29/2016 1523   CHOL 242 (H) 03/02/2013 1606   TRIG 309 (H) 02/14/2019 0501   TRIG 229 (H) 04/15/2014 1207   TRIG 343 (H) 03/02/2013 1606   HDL 37 (L) 02/14/2019 0501   HDL 44 10/29/2016 1523   HDL 45 04/15/2014 1207   HDL 43 03/02/2013 1606   CHOLHDL 5.6 02/14/2019 0501   VLDL 62 (H) 02/14/2019 0501   LDLCALC 107 (H) 02/14/2019 0501   LDLCALC 109 (H) 10/29/2016 1523   LDLCALC 108 (H) 04/15/2014 1207   LDLCALC 130 (H) 03/02/2013 1606   HgbA1c:  Lab Results  Component Value Date   HGBA1C 7.3 (H) 02/14/2019   Urine Drug Screen: No results found for: LABOPIA, COCAINSCRNUR, LABBENZ, AMPHETMU, THCU, LABBARB  Alcohol Level No results found for: ETH  IMAGING  Ct Angio Head W Or Wo Contrast Ct Angio Neck W And/or Wo Contrast 02/13/2019 IMPRESSION:  1. Negative CTA for large vessel occlusion. Wide patency of the major arterial vasculature of the head and neck.  2. Moderate atheromatous irregularity involving the PCAs bilaterally, left greater than right.  3.  Additional minor for age atherosclerotic change elsewhere as above. No other hemodynamically significant or correctable stenosis.  4. 2.8 cm heterogeneous right thyroid nodule. Follow-up examination with nonemergent outpatient thyroid ultrasound recommended for further characterization.    Ct Head Wo Contrast 02/14/2019 IMPRESSION:  Stable distribution of late acute to subacute infarction within the left occipital lobe. No new acute intracranial abnormality identified.    Ct Head Wo Contrast 02/13/2019 IMPRESSION:  New focal hypodensity in the left occipital lobe. This most likely represents a completed infarction in the left PCA distribution, however, neoplasm can not be excluded. Contrast-enhanced MRI is recommended, as well as neurology consultation. (see below)   Ct Head W Contrast 02/13/2019 IMPRESSION:   After contrast administration, the left occipital low-density shows findings consistent with an acute/subacute occipital stroke. There is no finding to suggest the presence of an underlying mass lesion.      Transthoracic Echocardiogram  00/00/2020 Pending      PHYSICAL EXAM Blood pressure (!) 149/80, pulse 75, temperature 98.3 F (36.8 C), temperature source Oral, resp. rate 18, height 5\' 3"  (1.6 m), weight 76 kg, SpO2 99 %.   Exam: NAD, pleasant                  Speech:    Speech is normal; fluent and spontaneous with normal comprehension.  Cognition:    The patient is oriented to person, place, and time;     recent and remote memory intact;     language fluent;    Cranial Nerves:    The pupils are equal, round, and reactive to light. Right homonymous inferior quadrantanopia. Trigeminal sensation is intact and the muscles of mastication are normal. The face is symmetric. The palate elevates in the midline. Hearing intact. Voice is normal. Shoulder shrug is normal. The tongue has normal motion without fasciculations.   Coordination:  No dysmetria  Motor Observation:    No asymmetry, no atrophy, and no involuntary movements noted. Tone:    Normal muscle tone.     Strength:    Strength is V/V in the upper and lower limbs.      Sensation: intact to LT         ASSESSMENT/PLAN [name] is a 68 y.o. female with history of pacemaker ( for bradycardia), DM2, HTN, HLD, uterine cancer  presenting with visual field blurring in the right eye, and light headed feeling as well as pain in the back of her head . She did not receive IV t-PA due to late presentation and minimal deficits.   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   Carotid Doppler - CTA neck performed - carotid dopplers not indicated.  2D Echo - pending  LDL - 107  HgbA1c - 7.3  UDS - not performed  VTE prophylaxis - Lovenox  Diet  - Carb modified with thin liquids  No antithrombotic prior to admission, now on aspirin 325 mg daily  Patient counseled to be compliant with her antithrombotic medications. Discharge on 3 weeks of DUAP then ASA 81mg  alone.  Ongoing aggressive stroke risk factor management  Therapy recommendations:  No F/U therapy recommended  Disposition:  Pending  Hypertension  Stable .  Permissive hypertension (OK if < 220/120) but gradually normalize in 5-7 days . Long-term BP goal normotensive  Hyperlipidemia  Lipid lowering medication PTA:  none  LDL 107, goal < 70  Current lipid lowering medication: Crestor 5mg  daily  Intolerant to Crestor and Pravachol according to chart   Diabetes  HgbA1c 7.3, goal < 7.0  Uncontrolled   Other Stroke Risk Factors  Advanced age  Obesity, Body mass index is 29.68 kg/m., recommend weight loss, diet and exercise as appropriate    Other Active Problems  2.8 cm heterogeneous right thyroid nodule. Follow-up examination with nonemergent outpatient thyroid ultrasound recommended for further characterization.    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 Resultant Right homonymous inferior quadrantanopia. Needs strict management of her vascular risk factors, Echo unremarkable, DUAP for 3 weeks and then ASA alone. Discussed with [facility]. Will email her cardiologist to see if pacemaker can be interrogated for afib. F/u with dr Leonie Man outpatient in 8 weeks at [facility]   Personally examined patient and images, and have participated in and made any corrections needed to history, physical, neuro exam,assessment and plan as stated above.  I have  personally obtained the history, evaluated lab date, reviewed imaging studies and agree with radiology interpretations.    Sarina Ill, MD Stroke Neurology   A total of 25 minutes was spent for the care of this patient, spent on counseling patient and family on different diagnostic and therapeutic options, counseling and coordination of care, riskd ans benefits of management, compliance, or risk factor reduction and education.  To contact Stroke Continuity provider, please refer to [email or web address] After hours, contact General Neurology

## 2019-02-15 NOTE — Discharge Summary (Addendum)
Physician Discharge Summary  [name] MRN:[id number] DOB: 01/14/1951 DOA: 02/13/2019  PCP: Gottschalk, Ashly M, DO  Admit date: 02/13/2019 Discharge date: 02/15/2019  Admitted From: Home  Disposition:  Home   Recommendations for Outpatient Follow-up:  1. Follow up with PCP in 1-2 weeks 2. Please obtain BMP/CBC in one week 3. Needs follow up for thyroid nodule.  4. Needs follow up with neurologist for stroke.   Home Health: none  Discharge Condition: stable.  CODE STATUS: full code Diet recommendation: Heart Healthy / Carb Modified  Brief/Interim Summary: 68-year-old with past medical history significant for diabetes, GERD, symptomatic bradycardia status post pacemaker, hypertension.  Patient was seen for headache on Monday with vision changes, had blurry vision in the right visual field most noticeable when reading.  She reported headache in the back of her head and neck.  Referred to  for further evaluation. CT head showed acute subacute left occipital stroke.  -acute subacute occipital stroke: Unable to perform MRI. Plan to repeat CT head today. Neurology consulted. Recommend aspirin and plavix for 3 weeks then aspirin alone.  Echo normal EF Globin A1c 7.3. Continue with aspirin. PT, OT, speech therapy evaluation. LDL 107, triglycerides 309.  Discussed diet recommendation with patient.  She agreed to start taking low-dose Crestor.  Crestor was to starting that she was able to tolerate the most.  Diabetes type 2: Continue with Januvia.  Sliding scale insulin.  resume  metformin  Hypertension resume bystolic.   Bradycardia; , status post pacemaker  Thyroid nodule; need out patient work up  Discharge Diagnoses:  Principal Problem:   Occipital stroke (HCC) Active Problems:   GERD (gastroesophageal reflux disease)   Hyperlipidemia associated with type 2 diabetes mellitus (HCC)   Hypertension associated with diabetes (HCC)   Pacemaker   Type 2 diabetes  mellitus without complication, without long-term current use of insulin (HCC)    Discharge Instructions  Discharge Instructions    Diet - low sodium heart healthy   Complete by:  As directed    Increase activity slowly   Complete by:  As directed      Allergies as of 02/15/2019      Reactions   Crestor [rosuvastatin] Other (See Comments)   Myalgias   Pravachol [pravastatin Sodium] Other (See Comments)   Stomach pain and constipation      Medication List    TAKE these medications   aspirin 81 MG EC tablet Take 1 tablet (81 mg total) by mouth daily. Start taking on:  February 16, 2019   BYSTOLIC 5 MG tablet Generic drug:  nebivolol TAKE  (1)  TABLET TWICE A DAY.   calcium citrate-vitamin D 315-200 MG-UNIT tablet Commonly known as:  CITRACAL+D Take 1 tablet by mouth daily.   clopidogrel 75 MG tablet Commonly known as:  PLAVIX Take 1 tablet (75 mg total) by mouth daily.   glucose blood test strip Use to check BG up to qd   metFORMIN 500 MG tablet Commonly known as:  GLUCOPHAGE Take 1.5 tablets (750 mg total) by mouth daily with breakfast.   ONE TOUCH ULTRA 2 w/Device Kit 1 Device by Does not apply route daily.   ONETOUCH DELICA LANCETS FINE Misc Use to check BG up to qd   pantoprazole 40 MG tablet Commonly known as:  PROTONIX Take 1 tablet (40 mg total) by mouth daily.   rosuvastatin 5 MG tablet Commonly known as:  CRESTOR Take 1 tablet (5 mg total) by mouth daily at 6 PM.     sertraline 50 MG tablet Commonly known as:  ZOLOFT Take 1 tablet (50 mg total) by mouth daily.   sitaGLIPtin 100 MG tablet Commonly known as:  JANUVIA Take 1 tablet (100 mg total) by mouth daily.      Follow-up Information    Ronnie Doss M, DO Follow up in 1 week(s).   Specialty:  Family Medicine Contact information: [place] [address] [phone]        Garvin Fila, MD Follow up in 4 week(s).   Specialties:  Neurology, Radiology Contact  information: [address] [place] [address] [phone]          Allergies  Allergen Reactions  . Crestor [Rosuvastatin] Other (See Comments)    Myalgias   . Pravachol [Pravastatin Sodium] Other (See Comments)    Stomach pain and constipation     Consultations: Neurology   Procedures/Studies: Ct Angio Head W Or Wo Contrast  Result Date: 02/13/2019 CLINICAL DATA:  Initial evaluation for acute right-sided visual loss, no left PCA territory infarct. EXAM: CT ANGIOGRAPHY HEAD AND NECK TECHNIQUE: Multidetector CT imaging of the head and neck was performed using the standard protocol during bolus administration of intravenous contrast. Multiplanar CT image reconstructions and MIPs were obtained to evaluate the vascular anatomy. Carotid stenosis measurements (when applicable) are obtained utilizing NASCET criteria, using the distal internal carotid diameter as the denominator. CONTRAST:  36m OMNIPAQUE IOHEXOL 350 MG/ML SOLN COMPARISON:  Prior CT from earlier the same day. FINDINGS: CTA NECK FINDINGS Aortic arch: Visualized aortic arch of normal caliber with normal branch pattern. Mild plaque at the origin of the left CCA without flow-limiting stenosis. No hemodynamically significant stenosis about the origin of the great vessels. Visualized subclavian arteries widely patent. Right carotid system: Right common carotid and internal carotid arteries are widely patent without stenosis, dissection, or occlusion. Minor atherosclerotic change about the right carotid bifurcation without hemodynamically significant stenosis. Left carotid system: Left common and internal carotid arteries widely patent without stenosis, dissection, or occlusion. Minor atherosclerotic change about the left carotid bifurcation without hemodynamically significant stenosis. Vertebral arteries: Both of the vertebral arteries arise from the subclavian arteries. Left vertebral artery dominant. Vertebral arteries  widely patent within the neck without stenosis, dissection, or occlusion. Skeleton: No acute osseous finding. No discrete lytic or blastic osseous lesions. Mild cervical spondylolysis at C5-6 and C6-7. Other neck: No other acute soft tissue abnormality within the neck. Diffuse fatty infiltration of the right parotid gland noted. Heterogeneous right thyroid nodule measuring up to approximately 2.8 cm noted, indeterminate (series 5, image 141). Upper chest: Streak artifact from left-sided pacemaker/AICD. Scattered atelectatic changes noted within the visualized lungs. Partially visualized upper chest demonstrates no acute abnormality. Review of the MIP images confirms the above findings CTA HEAD FINDINGS Anterior circulation: Petrous segments widely patent bilaterally. Scattered atheromatous plaque within the cavernous/supraclinoid ICAs with relatively mild diffuse narrowing. ICA termini widely patent. A1 segments patent bilaterally. Right A1 slightly dominant. Normal anterior communicating artery. Anterior cerebral arteries widely patent to their distal aspects without stenosis. No M1 stenosis or occlusion. Distal MCA branches well perfused and symmetric. Posterior circulation: Vertebral arteries widely patent to the vertebrobasilar junction without stenosis. Left vertebral artery dominant. Posterior inferior cerebral arteries patent bilaterally. Basilar widely patent to its distal aspect without stenosis. Superior cerebral arteries patent bilaterally. Both of the posterior cerebral arteries primarily supplied via the basilar. PCAs demonstrate moderate atheromatous irregularity bilaterally with multifocal moderate segmental stenoses, left worse than right. PCAs  do remain patent to their distal aspects. Venous sinuses: Patent. Anatomic variants: None significant. Delayed phase: Small evolving acute left PCA territory infarct again noted with associated mild leptomeningeal enhancement. No other abnormal enhancement.  Review of the MIP images confirms the above findings IMPRESSION: 1. Negative CTA for large vessel occlusion. Wide patency of the major arterial vasculature of the head and neck. 2. Moderate atheromatous irregularity involving the PCAs bilaterally, left greater than right. 3. Additional minor for age atherosclerotic change elsewhere as above. No other hemodynamically significant or correctable stenosis. 4. 2.8 cm heterogeneous right thyroid nodule. Follow-up examination with nonemergent outpatient thyroid ultrasound recommended for further characterization. Electronically Signed   By: Jeannine Boga M.D.   On: 02/13/2019 23:58   Ct Head Wo Contrast  Result Date: 02/14/2019 CLINICAL DATA:  68 y/o  F; stroke for follow-up. EXAM: CT HEAD WITHOUT CONTRAST TECHNIQUE: Contiguous axial images were obtained from the base of the skull through the vertex without intravenous contrast. COMPARISON:  02/13/2019 CT head and CTA head. FINDINGS: Brain: Stable distribution late acute to subacute infarction within the left occipital lobe. No new findings of stroke, hemorrhage, extra-axial collection, hydrocephalus, or mass effect. Stable nonspecific white matter hypodensities are compatible with chronic microvascular ischemic changes. Vascular: Calcific atherosclerosis of carotid siphons and vertebral arteries. Hyperdense vessel. Skull: Normal. Negative for fracture or focal lesion. Sinuses/Orbits: No acute finding. Other: None. IMPRESSION: Stable distribution of late acute to subacute infarction within the left occipital lobe. No new acute intracranial abnormality identified. Electronically Signed   By: Kristine Garbe M.D.   On: 02/14/2019 13:03   Ct Head Wo Contrast  Result Date: 02/13/2019 CLINICAL DATA:  68 year old female with dizziness EXAM: CT HEAD WITHOUT CONTRAST TECHNIQUE: Contiguous axial images were obtained from the base of the skull through the vertex without intravenous contrast. COMPARISON:   11/03/2011 FINDINGS: Brain: No acute intracranial hemorrhage. No midline shift or mass effect. Focal hypodensity in the left occipital lobe, not present on the comparison. Vascular: Calcifications of the anterior circulation. Skull: No acute fracture.  No aggressive bone lesion identified. Sinuses/Orbits: Unremarkable appearance of the orbits. Mastoid air cells clear. No middle ear effusion. No significant sinus disease. Other: None IMPRESSION: New focal hypodensity in the left occipital lobe. This most likely represents a completed infarction in the left PCA distribution, however, neoplasm can not be excluded. Contrast-enhanced MRI is recommended, as well as neurology consultation. Electronically Signed   By: Corrie Mckusick D.O.   On: 02/13/2019 15:52   Ct Head W Contrast  Result Date: 02/13/2019 CLINICAL DATA:  Dizziness. Left occipital lesion on previous noncontrast study. EXAM: CT HEAD WITH CONTRAST TECHNIQUE: Contiguous axial images were obtained from the base of the skull through the vertex with intravenous contrast. CONTRAST:  47m OMNIPAQUE IOHEXOL 300 MG/ML  SOLN COMPARISON:  Earlier same day. FINDINGS: Brain: Following contrast administration, there is no finding to suggest the presence of tumor in the left occipital lobe. The area of low-density looks like a subacute stroke. The remainder the brain appears normal. No mass, hemorrhage, hydrocephalus or extra-axial collection. Vascular: Major vessels at the base of the brain show flow. Skull: Negative Sinuses/Orbits: Clear/normal Other: None IMPRESSION: After contrast administration, the left occipital low-density shows findings consistent with an acute/subacute occipital stroke. There is no finding to suggest the presence of an underlying mass lesion. These results will be called to the ordering clinician or representative by the Radiologist Assistant, and communication documented in the PACS or zVision Dashboard. Electronically Signed  By: Mark   Shogry M.D.   On: 02/13/2019 17:08   Ct Angio Neck W And/or Wo Contrast  Result Date: 02/13/2019 CLINICAL DATA:  Initial evaluation for acute right-sided visual loss, no left PCA territory infarct. EXAM: CT ANGIOGRAPHY HEAD AND NECK TECHNIQUE: Multidetector CT imaging of the head and neck was performed using the standard protocol during bolus administration of intravenous contrast. Multiplanar CT image reconstructions and MIPs were obtained to evaluate the vascular anatomy. Carotid stenosis measurements (when applicable) are obtained utilizing NASCET criteria, using the distal internal carotid diameter as the denominator. CONTRAST:  50mL OMNIPAQUE IOHEXOL 350 MG/ML SOLN COMPARISON:  Prior CT from earlier the same day. FINDINGS: CTA NECK FINDINGS Aortic arch: Visualized aortic arch of normal caliber with normal branch pattern. Mild plaque at the origin of the left CCA without flow-limiting stenosis. No hemodynamically significant stenosis about the origin of the great vessels. Visualized subclavian arteries widely patent. Right carotid system: Right common carotid and internal carotid arteries are widely patent without stenosis, dissection, or occlusion. Minor atherosclerotic change about the right carotid bifurcation without hemodynamically significant stenosis. Left carotid system: Left common and internal carotid arteries widely patent without stenosis, dissection, or occlusion. Minor atherosclerotic change about the left carotid bifurcation without hemodynamically significant stenosis. Vertebral arteries: Both of the vertebral arteries arise from the subclavian arteries. Left vertebral artery dominant. Vertebral arteries widely patent within the neck without stenosis, dissection, or occlusion. Skeleton: No acute osseous finding. No discrete lytic or blastic osseous lesions. Mild cervical spondylolysis at C5-6 and C6-7. Other neck: No other acute soft tissue abnormality within the neck. Diffuse fatty  infiltration of the right parotid gland noted. Heterogeneous right thyroid nodule measuring up to approximately 2.8 cm noted, indeterminate (series 5, image 141). Upper chest: Streak artifact from left-sided pacemaker/AICD. Scattered atelectatic changes noted within the visualized lungs. Partially visualized upper chest demonstrates no acute abnormality. Review of the MIP images confirms the above findings CTA HEAD FINDINGS Anterior circulation: Petrous segments widely patent bilaterally. Scattered atheromatous plaque within the cavernous/supraclinoid ICAs with relatively mild diffuse narrowing. ICA termini widely patent. A1 segments patent bilaterally. Right A1 slightly dominant. Normal anterior communicating artery. Anterior cerebral arteries widely patent to their distal aspects without stenosis. No M1 stenosis or occlusion. Distal MCA branches well perfused and symmetric. Posterior circulation: Vertebral arteries widely patent to the vertebrobasilar junction without stenosis. Left vertebral artery dominant. Posterior inferior cerebral arteries patent bilaterally. Basilar widely patent to its distal aspect without stenosis. Superior cerebral arteries patent bilaterally. Both of the posterior cerebral arteries primarily supplied via the basilar. PCAs demonstrate moderate atheromatous irregularity bilaterally with multifocal moderate segmental stenoses, left worse than right. PCAs do remain patent to their distal aspects. Venous sinuses: Patent. Anatomic variants: None significant. Delayed phase: Small evolving acute left PCA territory infarct again noted with associated mild leptomeningeal enhancement. No other abnormal enhancement. Review of the MIP images confirms the above findings IMPRESSION: 1. Negative CTA for large vessel occlusion. Wide patency of the major arterial vasculature of the head and neck. 2. Moderate atheromatous irregularity involving the PCAs bilaterally, left greater than right. 3. Additional  minor for age atherosclerotic change elsewhere as above. No other hemodynamically significant or correctable stenosis. 4. 2.8 cm heterogeneous right thyroid nodule. Follow-up examination with nonemergent outpatient thyroid ultrasound recommended for further characterization. Electronically Signed   By: Benjamin  McClintock M.D.   On: 02/13/2019 23:58     Subjective: Feeling well, symptoms improving.   Discharge Exam: Vitals:   02/15/19 0350   02/15/19 1110  BP: (!) 149/80 (!) 147/81  Pulse: 75 76  Resp: 18 16  Temp: 98.3 F (36.8 C) 98 F (36.7 C)  SpO2: 99% 98%     General: Pt is alert, awake, not in acute distress Cardiovascular: RRR, S1/S2 +, no rubs, no gallops Respiratory: CTA bilaterally, no wheezing, no rhonchi Abdominal: Soft, NT, ND, bowel sounds + Extremities: no edema, no cyanosis    The results of significant diagnostics from this hospitalization (including imaging, microbiology, ancillary and laboratory) are listed below for reference.     Microbiology: No results found for this or any previous visit (from the past 240 hour(s)).   Labs: BNP (last 3 results) No results for input(s): BNP in the last 8760 hours. Basic Metabolic Panel: Recent Labs  Lab 02/13/19 1819  NA 138  K 4.0  CL 103  CO2 27  GLUCOSE 159*  BUN 20  CREATININE 0.70  CALCIUM 9.4   Liver Function Tests: No results for input(s): AST, ALT, ALKPHOS, BILITOT, PROT, ALBUMIN in the last 168 hours. No results for input(s): LIPASE, AMYLASE in the last 168 hours. No results for input(s): AMMONIA in the last 168 hours. CBC: Recent Labs  Lab 02/13/19 1819  WBC 8.1  NEUTROABS 5.4  HGB 13.3  HCT 42.5  MCV 93.2  PLT 202   Cardiac Enzymes: No results for input(s): CKTOTAL, CKMB, CKMBINDEX, TROPONINI in the last 168 hours. BNP: Invalid input(s): POCBNP CBG: Recent Labs  Lab 02/14/19 0816 02/14/19 1200 02/14/19 2124 02/15/19 0942 02/15/19 1207  GLUCAP 117* 181* 159* 135* 218*    D-Dimer No results for input(s): DDIMER in the last 72 hours. Hgb A1c Recent Labs    02/14/19 0501  HGBA1C 7.3*   Lipid Profile Recent Labs    02/14/19 0501  CHOL 206*  HDL 37*  LDLCALC 107*  TRIG 309*  CHOLHDL 5.6   Thyroid function studies No results for input(s): TSH, T4TOTAL, T3FREE, THYROIDAB in the last 72 hours.  Invalid input(s): FREET3 Anemia work up No results for input(s): VITAMINB12, FOLATE, FERRITIN, TIBC, IRON, RETICCTPCT in the last 72 hours. Urinalysis    Component Value Date/Time   BILIRUBINUR neg 08/25/2015 1551   PROTEINUR 4+ 08/25/2015 1551   UROBILINOGEN negative 08/25/2015 1551   NITRITE neg 08/25/2015 1551   LEUKOCYTESUR large (3+) (A) 08/25/2015 1551   Sepsis Labs Invalid input(s): PROCALCITONIN,  WBC,  LACTICIDVEN Microbiology No results found for this or any previous visit (from the past 240 hour(s)).   Time coordinating discharge: 40 minutes  SIGNED:   Belkys A Regalado, MD  [facility]  

## 2019-02-16 ENCOUNTER — Other Ambulatory Visit: Payer: Self-pay

## 2019-02-16 DIAGNOSIS — I63432 Cerebral infarction due to embolism of left posterior cerebral artery: Secondary | ICD-10-CM

## 2019-02-17 NOTE — Consult Note (Signed)
            [facility] CM Primary Care Navigator  02/17/2019  [name] 01-27-1951 [id number]   Wenttoseepatient at the bedsideto identify possible discharge needs butshe was alreadydischargedhome (with Outpatient rehab) over the weekend.   Per MD note, patient was seen for headache with vision changes/ blurry vision. (acute subacute left occipital stroke)  Primary care provider's officeis listed as providing transition of care (TOC).  Patient hasdischarge instruction to follow-up with primary care provider in 1 week, neurology follow-up in 4 weeks.   For additional questions please contact:  Edwena Felty A. Jodiann Ognibene, BSN, RN-BC [facility] PRIMARY CARE Navigator Cell: [phone]

## 2019-02-19 ENCOUNTER — Ambulatory Visit (INDEPENDENT_AMBULATORY_CARE_PROVIDER_SITE_OTHER): Payer: PPO | Admitting: *Deleted

## 2019-02-19 ENCOUNTER — Telehealth: Payer: Self-pay | Admitting: *Deleted

## 2019-02-19 DIAGNOSIS — R001 Bradycardia, unspecified: Secondary | ICD-10-CM

## 2019-02-19 DIAGNOSIS — I495 Sick sinus syndrome: Secondary | ICD-10-CM

## 2019-02-19 NOTE — Telephone Encounter (Signed)
Transmission received. Histograms appropriate. 2 mode switches (<0.1%)--no EGMs due to duration <30sec. 12 ventricular high rate episodes--longest 6sec duration, available markers/EGMs suggest NSVT and atrially-driven 1:1 episodes (A-tach).  Patient made aware that transmission was received and does not show A-fib. Advised I will call back if any further recommendations from Dr. Lovena Le or Dr. Jaynee Eagles. Patient verbalizes understanding and agreement with plan.

## 2019-02-19 NOTE — Telephone Encounter (Signed)
Spoke with patient. She agrees to send a transmission for review this AM.

## 2019-02-19 NOTE — Telephone Encounter (Signed)
Pt ended up in hospital but has an appointment with neurology 03/26/2019

## 2019-02-19 NOTE — Telephone Encounter (Signed)
Thank you :)

## 2019-02-19 NOTE — Telephone Encounter (Signed)
-----   Message from Evans Lance, MD sent at 02/19/2019  8:33 AM EST ----- Regarding: RE: Stroke followup We will get her device checked. Thanks. GT ----- Message ----- From: Melvenia Beam, MD Sent: 02/15/2019  11:44 AM EST To: Evans Lance, MD Subject: Stroke followup                                Dr. Lovena Le, patient had an occipital stroke likely due to atherosclerosis of the left PCA. We are sending her home on DUAP for 3 weeks then ASA alone. Can he pacemaker be interrogated for afib in your office just in case? Thanks

## 2019-02-20 LAB — CUP PACEART REMOTE DEVICE CHECK
Battery Impedance: 227 Ohm
Battery Voltage: 2.78 V
Brady Statistic AP VP Percent: 0 %
Brady Statistic AP VS Percent: 0 %
Brady Statistic AS VP Percent: 0 %
Brady Statistic AS VS Percent: 100 %
Implantable Lead Implant Date: 20050914
Implantable Lead Implant Date: 20050914
Implantable Lead Location: 753859
Implantable Lead Location: 753860
Implantable Lead Model: 5076
Implantable Lead Model: 5076
Implantable Pulse Generator Implant Date: 20150605
Lead Channel Impedance Value: 495 Ohm
Lead Channel Impedance Value: 518 Ohm
Lead Channel Pacing Threshold Amplitude: 0.5 V
Lead Channel Pacing Threshold Amplitude: 0.875 V
Lead Channel Pacing Threshold Pulse Width: 0.4 ms
Lead Channel Setting Pacing Amplitude: 2 V
Lead Channel Setting Pacing Amplitude: 2.5 V
Lead Channel Setting Pacing Pulse Width: 0.4 ms
MDC IDC MSMT BATTERY REMAINING LONGEVITY: 131 mo
MDC IDC MSMT LEADCHNL RV PACING THRESHOLD PULSEWIDTH: 0.4 ms
MDC IDC SESS DTM: 20200305135410
MDC IDC SET LEADCHNL RV SENSING SENSITIVITY: 5.6 mV

## 2019-02-24 ENCOUNTER — Ambulatory Visit (INDEPENDENT_AMBULATORY_CARE_PROVIDER_SITE_OTHER): Payer: PPO | Admitting: Family Medicine

## 2019-02-24 VITALS — BP 117/71 | HR 72 | Temp 97.9°F | Ht 63.0 in | Wt 163.0 lb

## 2019-02-24 DIAGNOSIS — Z950 Presence of cardiac pacemaker: Secondary | ICD-10-CM | POA: Diagnosis not present

## 2019-02-24 DIAGNOSIS — I6930 Unspecified sequelae of cerebral infarction: Secondary | ICD-10-CM | POA: Diagnosis not present

## 2019-02-24 DIAGNOSIS — E041 Nontoxic single thyroid nodule: Secondary | ICD-10-CM

## 2019-02-24 DIAGNOSIS — Z09 Encounter for follow-up examination after completed treatment for conditions other than malignant neoplasm: Secondary | ICD-10-CM | POA: Diagnosis not present

## 2019-02-24 DIAGNOSIS — I639 Cerebral infarction, unspecified: Secondary | ICD-10-CM | POA: Diagnosis not present

## 2019-02-24 DIAGNOSIS — T884XXA Failed or difficult intubation, initial encounter: Secondary | ICD-10-CM | POA: Insufficient documentation

## 2019-02-24 NOTE — Patient Instructions (Signed)
You had labs performed today.  You will be contacted with the results of the labs once they are available, usually in the next 3 business days for routine lab work.  If you had a pap smear or biopsy performed, expect to be contacted in about 7-10 days.  Carlon will get the thyroid ultrasound coordinated for you.  I will call you with the results and further instruction.   Thyroid Nodule  A thyroid nodule is an isolatedgrowth of thyroid cells that forms a lump in your thyroid gland. The thyroid gland is a butterfly-shaped gland. It is found in the lower front of your neck. This gland sends chemical messengers (hormones) through your blood to all parts of your body. These hormones are important in regulating your body temperature and helping your body to use energy. Thyroid nodules are common. Most are not cancerous (are benign). You may have one nodule or several nodules. Different types of thyroid nodules include:  Nodules that grow and fill with fluid (thyroid cysts).  Nodules that produce too much thyroid hormone (hot nodules or hyperthyroid).  Nodules that produce no thyroid hormone (cold nodules or hypothyroid).  Nodules that form from cancer cells (thyroid cancers). What are the causes? Usually, the cause of this condition is not known. What increases the risk? Factors that make this condition more likely to develop include:  Increasing age. Thyroid nodules become more common in people who are older than 68 years of age.  Gender. ? Benign thyroid nodules are more common in women. ? Cancerous (malignant) thyroid nodules are more common in men.  A family history that includes: ? Thyroid nodules. ? Pheochromocytoma. ? Thyroid carcinoma. ? Hyperparathyroidism.  Certain kinds of thyroid diseases, such as Hashimoto thyroiditis.  Lack of iodine.  A history of head and neck radiation, such as from X-rays. What are the signs or symptoms? It is common for this condition to cause  no symptoms. If you have symptoms, they may include:  A lump in your lower neck.  Feeling a lump or tickle in your throat.  Pain in your neck, jaw, or ear.  Having trouble swallowing. Hot nodules may cause symptoms that include:  Weight loss.  Warm, flushed skin.  Feeling hot.  Feeling nervous.  A racing heartbeat. Cold nodules may cause symptoms that include:  Weight gain.  Dry skin.  Brittle hair. This may also occur with hair loss.  Feeling cold.  Fatigue. Thyroid cancer nodules may cause symptoms that include:  Hard nodules that feel stuck to the thyroid gland.  Hoarseness.  Lumps in the glands near your thyroid (lymph nodes). How is this diagnosed? A thyroid nodule may be felt by your health care provider during a physical exam. This condition may also be diagnosed based on your symptoms. You may also have tests, including:  An ultrasound. This may be done to confirm the diagnosis.  A biopsy. This involves taking a sample from the nodule and looking at it under a microscope to see if the nodule is benign.  Blood tests to make sure that your thyroid is working properly.  Imaging tests such as MRI or CT scan may be done if: ? Your nodule is large. ? Your nodule is blocking your airway. ? Cancer is suspected. How is this treated? Treatment depends on the cause and size of your nodule or nodules. If the nodule is benign, treatment may not be necessary. Your health care provider may monitor the nodule to see if it goes away  without treatment. If the nodule continues to grow, is cancerous, or does not go away:  It may need to be drained with a needle.  It may need to be removed with surgery. If you have surgery, part or all of your thyroid gland may need to be removed as well. Follow these instructions at home:  Pay attention to any changes in your nodule.  Take over-the-counter and prescription medicines only as told by your health care provider.  Keep  all follow-up visits as told by your health care provider. This is important. Contact a health care provider if:  Your voice changes.  You have trouble swallowing.  You have pain in your neck, ear, or jaw that is getting worse.  Your nodule gets bigger.  Your nodule starts to make it harder for you to breathe. Get help right away if:  You have a sudden fever.  You feel very weak.  Your muscles look like they are shrinking (muscle wasting).  You have mood swings.  You feel very restless.  You feel confused.  You are seeing or hearing things that other people do not see or hear (having hallucinations).  You feel suddenly nauseous or throw up.  You suddenly have diarrhea.  You have chest pain.  There is a loss of consciousness. This information is not intended to replace advice given to you by your health care provider. Make sure you discuss any questions you have with your health care provider. Document Released: 10/26/2004 Document Revised: 08/05/2016 Document Reviewed: 03/16/2015 Elsevier Interactive Patient Education  2019 [employer].

## 2019-02-24 NOTE — Progress Notes (Signed)
Subjective: CC: Hospital follow up PCP: Janora Norlander, DO SFK:CLEXNT [name] is a 68 y.o. female presenting to clinic today for:  1. Hospital follow up Patient was seen in the office on 02/13/2019 with reports of visual loss in the right eye.  She was seen by the optometrist who had concern for CVA.  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  She was unable to get an MRI of the brain secondary to pacemaker.  She had CT head repeated which noted stability of this lesion.  Additionally, a thyroid nodule was noted.  She will need outpatient thyroid ultrasound.  TSH was normal range.  She was referred to neurology outpatient advised to follow-up in office for repeat BMP and CBC.  She was discharged with ASA and Plavix x3 weeks then advised to continue ASA alone.  She reports continued deficit in vision.  She notices this more with reading.  She has been compliant with the Crestor and so far is tolerating without difficulty.  She is aware of the thyroid lesion and would like to coordinate this on the same day as her neuro evaluation in [place].  Additionally, she reports she had she got not yet increased her metformin to 750 mg daily as we discussed during her last visit in January but actually did this recently.  She has decreased her Pepsi's to only once since her discharge from the hospital.  Surprisingly, she states that this is not is been as difficult as she expected.  She plans on making major life changes because she does not want to have recurrent stroke or heart attacks.  ROS: Per HPI  Allergies  Allergen Reactions  . Crestor [Rosuvastatin] Other (See Comments)    Myalgias   . Pravachol [Pravastatin Sodium] Other (See Comments)    Stomach pain and constipation    Past Medical History:  Diagnosis Date  . Anxiety   . Arthritis    of the neck  . Colon polyps   . Esophageal  stricture   . GERD (gastroesophageal reflux disease)    Hiatal Hernia  . History of uterine cancer   . Hyperlipidemia   . Hypertension   . Osteoporosis   . Pacemaker   . Symptomatic bradycardia     Current Outpatient Medications:  .  aspirin EC 81 MG EC tablet, Take 1 tablet (81 mg total) by mouth daily., Disp: 30 tablet, Rfl: 0 .  Blood Glucose Monitoring Suppl (ONE TOUCH ULTRA 2) w/Device KIT, 1 Device by Does not apply route daily., Disp: 1 each, Rfl: 0 .  BYSTOLIC 5 MG tablet, TAKE  (1)  TABLET TWICE A DAY., Disp: 60 tablet, Rfl: 11 .  calcium citrate-vitamin D (CITRACAL+D) 315-200 MG-UNIT per tablet, Take 1 tablet by mouth daily. , Disp: , Rfl:  .  clopidogrel (PLAVIX) 75 MG tablet, Take 1 tablet (75 mg total) by mouth daily., Disp: 21 tablet, Rfl: 0 .  glucose blood test strip, Use to check BG up to qd, Disp: 100 each, Rfl: 2 .  metFORMIN (GLUCOPHAGE) 500 MG tablet, Take 1.5 tablets (750 mg total) by mouth daily with breakfast., Disp: 135 tablet, Rfl: 1 .  ONETOUCH DELICA LANCETS FINE MISC, Use to check BG up to qd, Disp: 100 each, Rfl: 2 .  pantoprazole (PROTONIX) 40 MG tablet, Take 1 tablet (40 mg total) by mouth daily., Disp: 90 tablet, Rfl: 1 .  rosuvastatin (CRESTOR) 5 MG tablet, Take 1 tablet (5 mg total) by mouth daily at 6 PM., Disp: 30 tablet, Rfl: 0 .  sertraline (ZOLOFT) 50 MG tablet, Take 1 tablet (50 mg total) by mouth daily., Disp: 90 tablet, Rfl: 1 .  sitaGLIPtin (JANUVIA) 100 MG tablet, Take 1 tablet (100 mg total) by mouth daily., Disp: 90 tablet, Rfl: 1 Social History   Socioeconomic History  . Marital status: Divorced    Spouse name: Not on file  . Number of children: 1  . Years of education: GED  . Highest education level: GED or equivalent  Occupational History  . Occupation: retired    Comment: Engineer, manufacturing systems  Social Needs  . Financial resource strain: Not hard at all  . Food insecurity:    Worry: Never true    Inability: Never true  .  Transportation needs:    Medical: No    Non-medical: No  Tobacco Use  . Smoking status: Never Smoker  . Smokeless tobacco: Never Used  Substance and Sexual Activity  . Alcohol use: No    Comment: may have a social drink rarely  . Drug use: No  . Sexual activity: Not Currently  Lifestyle  . Physical activity:    Days per week: 3 days    Minutes per session: 20 min  . Stress: Not at all  Relationships  . Social connections:    Talks on phone: More than three times a week    Gets together: More than three times a week    Attends religious service: More than 4 times per year    Active member of club or organization: Yes    Attends meetings of clubs or organizations: More than 4 times per year    Relationship status: Living with partner  . Intimate partner violence:    Fear of current or ex partner: No    Emotionally abused: No    Physically abused: No    Forced sexual activity: No  Other Topics Concern  . Not on file  Social History Narrative  . Not on file   Family History  Problem Relation Age of Onset  . Heart disease Mother   . Heart attack Mother   . Heart disease Father   . Heart attack Father   . Cancer Sister        breast  . Alcohol abuse Brother   . COPD Sister   . Healthy Daughter     Objective: Office vital signs reviewed. BP 117/71   Pulse 72   Temp 97.9 F (36.6 C) (Oral)   Ht '5\' 3"'  (1.6 m)   Wt 163 lb (73.9 kg)   BMI 28.87 kg/m   Physical Examination:  General: Awake, alert, well nourished, No acute distress HEENT: Normal    Neck: No masses palpated. No lymphadenopathy    Eyes: PERRLA, extraocular membranes intact, sclera white Cardio: regular rate and rhythm, S1S2 heard, no murmurs appreciated Pulm: clear to auscultation bilaterally, no wheezes, rhonchi or rales; normal work of breathing on room air Extremities: warm, well perfused, No edema, cyanosis or clubbing; +2 pulses bilaterally Neuro: Peripheral vision appears to be  intact.  Assessment/ Plan: 67 y.o. female   1. Occipital stroke ([place]) Seems to be acclimating to visual deficit without difficulty.  However, would probably refrain from any driving until she is cleared by neurology.  She is compliant with the Plavix, statin, ASA and now increased dose of metformin.  I have advised her to follow-up  with me in about 3 months to recheck A1c.  Continue to reduce Pepsi/sugary beverages. - Basic Metabolic Panel - CBC  2. Hospital discharge follow-up I reviewed her discharge summary, result notes and lab results.  3. Thyroid nodule Thyroid function was within normal limits but nodule was noted on CT neck.  Therefore, proceed with ultrasound of the thyroid.  We will plan to refer for biopsy if needed. - US Soft Tissue Head/Neck; Future   Orders Placed This Encounter  Procedures  . US Soft Tissue Head/Neck    Standing Status:   Future    Standing Expiration Date:   04/25/2020    Order Specific Question:   Reason for Exam (SYMPTOM  OR DIAGNOSIS REQUIRED)    Answer:   thyroid nodule    Order Specific Question:   Preferred imaging location?    Answer:   Internal  . Basic Metabolic Panel  . CBC   No orders of the defined types were placed in this encounter.   Additionally, patient notes that she had found a document in her home from previous hospitalization that did noted difficult airway.  She wanted me to place an FYI in her chart.  Janora Norlander, DO [place] [phone]

## 2019-02-25 LAB — CBC
HEMATOCRIT: 38.8 % (ref 34.0–46.6)
Hemoglobin: 13.4 g/dL (ref 11.1–15.9)
MCH: 30.5 pg (ref 26.6–33.0)
MCHC: 34.5 g/dL (ref 31.5–35.7)
MCV: 88 fL (ref 79–97)
Platelets: 242 10*3/uL (ref 150–450)
RBC: 4.39 x10E6/uL (ref 3.77–5.28)
RDW: 12.7 % (ref 11.7–15.4)
WBC: 8.8 10*3/uL (ref 3.4–10.8)

## 2019-02-25 LAB — BASIC METABOLIC PANEL
BUN/Creatinine Ratio: 21 (ref 12–28)
BUN: 17 mg/dL (ref 8–27)
CALCIUM: 9.8 mg/dL (ref 8.7–10.3)
CO2: 22 mmol/L (ref 20–29)
CREATININE: 0.82 mg/dL (ref 0.57–1.00)
Chloride: 100 mmol/L (ref 96–106)
GFR calc Af Amer: 86 mL/min/{1.73_m2} (ref >59)
GFR, EST NON AFRICAN AMERICAN: 74 mL/min/{1.73_m2} (ref >59)
Glucose: 119 mg/dL — ABNORMAL HIGH (ref 65–99)
Potassium: 4.5 mmol/L (ref 3.5–5.2)
Sodium: 142 mmol/L (ref 134–144)

## 2019-03-02 ENCOUNTER — Other Ambulatory Visit: Payer: Self-pay

## 2019-03-02 ENCOUNTER — Other Ambulatory Visit: Payer: Self-pay | Admitting: Family Medicine

## 2019-03-02 ENCOUNTER — Ambulatory Visit (HOSPITAL_COMMUNITY)
Admission: RE | Admit: 2019-03-02 | Discharge: 2019-03-02 | Disposition: A | Discharge status: Home / Self Care | Payer: PPO | Source: Ambulatory Visit | Attending: Family Medicine | Admitting: Family Medicine

## 2019-03-02 ENCOUNTER — Encounter: Payer: Self-pay | Admitting: Cardiology

## 2019-03-02 DIAGNOSIS — E041 Nontoxic single thyroid nodule: Secondary | ICD-10-CM

## 2019-03-02 NOTE — Progress Notes (Signed)
Remote pacemaker transmission.   

## 2019-03-04 ENCOUNTER — Other Ambulatory Visit: Payer: Self-pay | Admitting: Pediatrics

## 2019-03-04 ENCOUNTER — Telehealth: Payer: Self-pay | Admitting: Family Medicine

## 2019-03-04 DIAGNOSIS — E119 Type 2 diabetes mellitus without complications: Secondary | ICD-10-CM

## 2019-03-04 NOTE — Telephone Encounter (Signed)
Aware of results. 

## 2019-03-16 ENCOUNTER — Other Ambulatory Visit: Payer: Self-pay | Admitting: *Deleted

## 2019-03-16 MED ORDER — ROSUVASTATIN CALCIUM 5 MG PO TABS: 5.0000 mg | ORAL_TABLET | Freq: Every day | ORAL | 4 refills | Status: DC

## 2019-03-26 ENCOUNTER — Inpatient Hospital Stay: Payer: PPO | Admitting: Adult Health

## 2019-04-20 ENCOUNTER — Ambulatory Visit: Payer: PPO | Admitting: Family Medicine

## 2019-05-06 ENCOUNTER — Other Ambulatory Visit: Payer: Self-pay | Admitting: Family Medicine

## 2019-05-06 DIAGNOSIS — E119 Type 2 diabetes mellitus without complications: Secondary | ICD-10-CM

## 2019-05-13 DIAGNOSIS — E041 Nontoxic single thyroid nodule: Secondary | ICD-10-CM | POA: Diagnosis not present

## 2019-05-14 ENCOUNTER — Other Ambulatory Visit: Payer: Self-pay | Admitting: Otolaryngology

## 2019-05-14 DIAGNOSIS — E041 Nontoxic single thyroid nodule: Secondary | ICD-10-CM

## 2019-05-21 ENCOUNTER — Ambulatory Visit (INDEPENDENT_AMBULATORY_CARE_PROVIDER_SITE_OTHER): Payer: PPO | Admitting: *Deleted

## 2019-05-21 DIAGNOSIS — I495 Sick sinus syndrome: Secondary | ICD-10-CM

## 2019-05-21 DIAGNOSIS — R001 Bradycardia, unspecified: Secondary | ICD-10-CM

## 2019-05-22 LAB — CUP PACEART REMOTE DEVICE CHECK
Battery Impedance: 251 Ohm
Battery Remaining Longevity: 128 mo
Battery Voltage: 2.78 V
Brady Statistic AP VP Percent: 0 %
Brady Statistic AP VS Percent: 0 %
Brady Statistic AS VP Percent: 0 %
Brady Statistic AS VS Percent: 100 %
Date Time Interrogation Session: 20200604132008
Implantable Lead Implant Date: 20050914
Implantable Lead Implant Date: 20050914
Implantable Lead Location: 753859
Implantable Lead Location: 753860
Implantable Lead Model: 5076
Implantable Lead Model: 5076
Implantable Pulse Generator Implant Date: 20150605
Lead Channel Impedance Value: 496 Ohm
Lead Channel Impedance Value: 502 Ohm
Lead Channel Pacing Threshold Amplitude: 0.5 V
Lead Channel Pacing Threshold Amplitude: 0.75 V
Lead Channel Pacing Threshold Pulse Width: 0.4 ms
Lead Channel Pacing Threshold Pulse Width: 0.4 ms
Lead Channel Setting Pacing Amplitude: 2 V
Lead Channel Setting Pacing Amplitude: 2.5 V
Lead Channel Setting Pacing Pulse Width: 0.4 ms
Lead Channel Setting Sensing Sensitivity: 5.6 mV

## 2019-05-26 ENCOUNTER — Other Ambulatory Visit: Payer: Self-pay

## 2019-05-27 ENCOUNTER — Ambulatory Visit (INDEPENDENT_AMBULATORY_CARE_PROVIDER_SITE_OTHER): Payer: PPO | Admitting: Family Medicine

## 2019-05-27 VITALS — BP 133/73 | HR 70 | Temp 98.1°F | Ht 63.0 in | Wt 161.0 lb

## 2019-05-27 DIAGNOSIS — E119 Type 2 diabetes mellitus without complications: Secondary | ICD-10-CM

## 2019-05-27 DIAGNOSIS — F419 Anxiety disorder, unspecified: Secondary | ICD-10-CM

## 2019-05-27 DIAGNOSIS — E1159 Type 2 diabetes mellitus with other circulatory complications: Secondary | ICD-10-CM

## 2019-05-27 DIAGNOSIS — E041 Nontoxic single thyroid nodule: Secondary | ICD-10-CM | POA: Diagnosis not present

## 2019-05-27 DIAGNOSIS — I10 Essential (primary) hypertension: Secondary | ICD-10-CM

## 2019-05-27 DIAGNOSIS — E785 Hyperlipidemia, unspecified: Secondary | ICD-10-CM | POA: Diagnosis not present

## 2019-05-27 DIAGNOSIS — E1169 Type 2 diabetes mellitus with other specified complication: Secondary | ICD-10-CM | POA: Diagnosis not present

## 2019-05-27 DIAGNOSIS — K219 Gastro-esophageal reflux disease without esophagitis: Secondary | ICD-10-CM

## 2019-05-27 LAB — BAYER DCA HB A1C WAIVED: HB A1C (BAYER DCA - WAIVED): 6.8 % (ref <7.0)

## 2019-05-27 MED ORDER — SERTRALINE HCL 50 MG PO TABS: 50.0000 mg | ORAL_TABLET | Freq: Every day | ORAL | 3 refills | Status: DC

## 2019-05-27 MED ORDER — SITAGLIPTIN PHOSPHATE 100 MG PO TABS: 100.0000 mg | ORAL_TABLET | Freq: Every day | ORAL | 3 refills | Status: DC

## 2019-05-27 MED ORDER — METFORMIN HCL 500 MG PO TABS: ORAL_TABLET | ORAL | 3 refills | Status: DC

## 2019-05-27 MED ORDER — PANTOPRAZOLE SODIUM 40 MG PO TBEC: 40.0000 mg | DELAYED_RELEASE_TABLET | Freq: Every day | ORAL | 3 refills | Status: DC

## 2019-05-27 NOTE — Progress Notes (Signed)
Subjective: CC: DM, HTN, HLD PCP: Janora Norlander, DO [name] is a 68 y.o. female presenting to clinic today for:  1. Type 2 Diabetes w/ hypertension/ hyperlipidemia:  Patient reports compliance with metformin 500 mg daily, Januvia 100 mg daily, Crestor 5 mg daily and Bystolic.  She does report that she seems to have some vaginal irritation with regards to initiation of Januvia.  Sometimes, she feels that symptoms are worsened if she does not drink enough fluids and relieved by drinking more fluids.  She occasionally uses OTC Monistat if she develops a yeast infection.  Last eye exam: needs Last foot exam: needs Last A1c:  Lab Results  Component Value Date   HGBA1C 7.3 (H) 02/14/2019   Nephropathy screen indicated?: needs Last flu, zoster and/or pneumovax:  Immunization History  Administered Date(s) Administered  . Influenza,inj,Quad PF,6+ Mos 10/29/2016, 01/03/2018, 10/08/2018  . Pneumococcal Conjugate-13 08/02/2017  . Pneumococcal Polysaccharide-23 08/04/2018    ROS: No chest pain, shortness of breath, lower extremity edema.  2.  Thyroid nodule Patient has 2 right-sided thyroid nodules.  These are going to be biopsied tomorrow.  She is currently receiving care with ear nose and throat at [facility].  ROS: Per HPI  Allergies  Allergen Reactions  . Pravachol [Pravastatin Sodium] Other (See Comments)    Stomach pain and constipation    Past Medical History:  Diagnosis Date  . Anxiety   . Arthritis    of the neck  . Colon polyps   . Esophageal stricture   . GERD (gastroesophageal reflux disease)    Hiatal Hernia  . History of uterine cancer   . Hyperlipidemia   . Hypertension   . Osteoporosis   . Pacemaker   . Symptomatic bradycardia     Current Outpatient Medications:  .  aspirin EC 81 MG EC tablet, Take 1 tablet (81 mg total) by mouth daily., Disp: 30 tablet, Rfl: 0 .  Blood Glucose Monitoring Suppl (ONE TOUCH ULTRA 2) w/Device KIT, 1 Device  by Does not apply route daily., Disp: 1 each, Rfl: 0 .  BYSTOLIC 5 MG tablet, TAKE  (1)  TABLET TWICE A DAY., Disp: 60 tablet, Rfl: 11 .  calcium citrate-vitamin D (CITRACAL+D) 315-200 MG-UNIT per tablet, Take 1 tablet by mouth daily. , Disp: , Rfl:  .  clopidogrel (PLAVIX) 75 MG tablet, Take 1 tablet (75 mg total) by mouth daily., Disp: 21 tablet, Rfl: 0 .  glucose blood test strip, Use to check BG up to qd, Disp: 100 each, Rfl: 2 .  metFORMIN (GLUCOPHAGE) 500 MG tablet, TAKE (1) TABLET DAILY WITH BREAKFAST., Disp: 90 tablet, Rfl: 0 .  ONETOUCH DELICA LANCETS FINE MISC, Use to check BG up to qd, Disp: 100 each, Rfl: 2 .  pantoprazole (PROTONIX) 40 MG tablet, Take 1 tablet (40 mg total) by mouth daily., Disp: 90 tablet, Rfl: 1 .  rosuvastatin (CRESTOR) 5 MG tablet, Take 1 tablet (5 mg total) by mouth daily at 6 PM., Disp: 30 tablet, Rfl: 4 .  sertraline (ZOLOFT) 50 MG tablet, Take 1 tablet (50 mg total) by mouth daily., Disp: 90 tablet, Rfl: 1 .  sitaGLIPtin (JANUVIA) 100 MG tablet, Take 1 tablet (100 mg total) by mouth daily., Disp: 90 tablet, Rfl: 1 Social History   Socioeconomic History  . Marital status: Divorced    Spouse name: Not on file  . Number of children: 1  . Years of education: GED  . Highest education level: GED or equivalent  Occupational History  . Occupation: retired    Comment: Engineer, manufacturing systems  Social Needs  . Financial resource strain: Not hard at all  . Food insecurity:    Worry: Never true    Inability: Never true  . Transportation needs:    Medical: No    Non-medical: No  Tobacco Use  . Smoking status: Never Smoker  . Smokeless tobacco: Never Used  Substance and Sexual Activity  . Alcohol use: No    Comment: may have a social drink rarely  . Drug use: No  . Sexual activity: Not Currently  Lifestyle  . Physical activity:    Days per week: 3 days    Minutes per session: 20 min  . Stress: Not at all  Relationships  . Social connections:    Talks on  phone: More than three times a week    Gets together: More than three times a week    Attends religious service: More than 4 times per year    Active member of club or organization: Yes    Attends meetings of clubs or organizations: More than 4 times per year    Relationship status: Living with partner  . Intimate partner violence:    Fear of current or ex partner: No    Emotionally abused: No    Physically abused: No    Forced sexual activity: No  Other Topics Concern  . Not on file  Social History Narrative  . Not on file   Family History  Problem Relation Age of Onset  . Heart disease Mother   . Heart attack Mother   . Heart disease Father   . Heart attack Father   . Cancer Sister        breast  . Alcohol abuse Brother   . COPD Sister   . Healthy Daughter     Objective: Office vital signs reviewed. There were no vitals taken for this visit.  Physical Examination:  General: Awake, alert, well nourished, No acute distress HEENT: Normal, sclera white, MMM Cardio: regular rate and rhythm, S1S2 heard, no murmurs appreciated Pulm: clear to auscultation bilaterally, no wheezes, rhonchi or rales; normal work of breathing on room air Extremities: warm, well perfused, No edema, cyanosis or clubbing; +2 pulses bilaterally Skin: dry; intact; no rashes or lesions Neuro: see DM foot exam below Psych: Mood stable, speech normal, affect appropriate Depression screen [facility] 2/9 05/27/2019 02/24/2019 02/13/2019  Decreased Interest 0 0 0  Down, Depressed, Hopeless 0 0 0  PHQ - 2 Score 0 0 0  Altered sleeping 0 0 -  Tired, decreased energy 0 0 -  Change in appetite 0 0 -  Feeling bad or failure about yourself  0 0 -  Trouble concentrating 0 0 -  Moving slowly or fidgety/restless 0 0 -  Suicidal thoughts 0 0 -  PHQ-9 Score 0 0 -  Difficult doing work/chores - - -   Assessment/ Plan: 68 y.o. female   1. Type 2 diabetes mellitus without complication, without long-term current use of  insulin (HCC) Under excellent control with A1c of 6.8 today.  Continue Januvia, metformin.  We discussed alternatives to the Januvia including glimepiride, glipizide and Jardiance today.  We will hold off and see how she does from a vaginal standpoint before making this transition. - Microalbumin / creatinine urine ratio - Bayer DCA Hb A1c Waived - metFORMIN (GLUCOPHAGE) 500 MG tablet; TAKE (1) TABLET DAILY WITH BREAKFAST.  Dispense: 90 tablet; Refill: 3 - sitaGLIPtin (  JANUVIA) 100 MG tablet; Take 1 tablet (100 mg total) by mouth daily.  Dispense: 90 tablet; Refill: 3  2. Hypertension associated with diabetes ([place]) Controlled.  Continue Bystolic  3. Hyperlipidemia associated with type 2 diabetes mellitus ([place]) Continue statin  4. Gastroesophageal reflux disease without esophagitis Controlled - pantoprazole (PROTONIX) 40 MG tablet; Take 1 tablet (40 mg total) by mouth daily.  Dispense: 90 tablet; Refill: 3  5. Anxiety Controlled - sertraline (ZOLOFT) 50 MG tablet; Take 1 tablet (50 mg total) by mouth daily.  Dispense: 90 tablet; Refill: 3  6. Thyroid nodule Plan for thyroid biopsy tomorrow   Orders Placed This Encounter  Procedures  . Microalbumin / creatinine urine ratio  . Bayer DCA Hb A1c Waived   No orders of the defined types were placed in this encounter.    Janora Norlander, DO [place] [phone]

## 2019-05-27 NOTE — Patient Instructions (Signed)
You had labs performed today.  You will be contacted with the results of the labs once they are available, usually in the next 3 business days for routine lab work.  If you had a pap smear or biopsy performed, expect to be contacted in about 7-10 days.  

## 2019-05-28 ENCOUNTER — Other Ambulatory Visit (HOSPITAL_COMMUNITY)
Admission: RE | Admit: 2019-05-28 | Discharge: 2019-05-28 | Disposition: A | Discharge status: Home / Self Care | Payer: PPO | Source: Ambulatory Visit | Attending: Radiology | Admitting: Radiology

## 2019-05-28 ENCOUNTER — Ambulatory Visit
Admission: RE | Admit: 2019-05-28 | Discharge: 2019-05-28 | Disposition: A | Discharge status: Home / Self Care | Payer: PPO | Source: Ambulatory Visit | Attending: Otolaryngology | Admitting: Otolaryngology

## 2019-05-28 DIAGNOSIS — E041 Nontoxic single thyroid nodule: Secondary | ICD-10-CM | POA: Insufficient documentation

## 2019-05-28 LAB — MICROALBUMIN / CREATININE URINE RATIO
Creatinine, Urine: 211 mg/dL
Microalb/Creat Ratio: 23 mg/g creat (ref 0–29)
Microalbumin, Urine: 49.3 ug/mL

## 2019-05-28 NOTE — Progress Notes (Signed)
Remote pacemaker transmission.   

## 2019-07-27 ENCOUNTER — Other Ambulatory Visit: Payer: Self-pay

## 2019-07-27 ENCOUNTER — Ambulatory Visit (INDEPENDENT_AMBULATORY_CARE_PROVIDER_SITE_OTHER): Payer: PPO | Admitting: Adult Health

## 2019-07-27 ENCOUNTER — Encounter: Payer: Self-pay | Admitting: Adult Health

## 2019-07-27 VITALS — BP 121/72 | HR 72 | Temp 98.0°F | Ht 63.0 in | Wt 161.0 lb

## 2019-07-27 DIAGNOSIS — I639 Cerebral infarction, unspecified: Secondary | ICD-10-CM

## 2019-07-27 DIAGNOSIS — E119 Type 2 diabetes mellitus without complications: Secondary | ICD-10-CM | POA: Diagnosis not present

## 2019-07-27 DIAGNOSIS — I152 Hypertension secondary to endocrine disorders: Secondary | ICD-10-CM

## 2019-07-27 DIAGNOSIS — E785 Hyperlipidemia, unspecified: Secondary | ICD-10-CM

## 2019-07-27 NOTE — Progress Notes (Signed)
[facility] [address] [place]. [place] [address] [phone]       HOSPITAL FOLLOW UP NOTE  [name] Date of Birth:  06-27-1951 Medical Record Number:  [id number]   Reason for Referral:  hospital stroke follow up    CHIEF COMPLAINT:  Chief Complaint  Patient presents with   Hospitalization Follow-up    Stroke f/u. Alone. Rm 9. No concerns at this time.     HPI: [name] being seen today for in office hospital follow-up regarding left occipital stroke on 02/13/2019.  History obtained from patient and chart review. Reviewed all radiology images and labs personally.  Stephanie an 62 y.o.femalePMH of pacemaker ( for bradycardia), DM2, HTN, HLD, uterine cancer who presented to [facility] ED on 02/13/2019 for c/o visual field blurring in the right eye, and light headed feeling as well as pain in the back of her head since 02/09/2019.  Per report, intended to treat headache with Advil, Celebrex and baby aspirin but due to ongoing symptoms, she followed up with ophthalmology and PCP on 02/13/2019 who referred her to ED for further evaluation.  Patient transferred to [facility] for Neurology d/t stroke.  Stroke work-up revealed acute/subacute left occipital stroke as evidenced on CT head (unable to perform MRI 2/2 pacemaker) with residual right homonymous inferior quadrantanopia.  Stroke likely related to PCA L>R stenosis as evidenced on CTA head and neck vs small vessel disease etiology.  She did not receive tPA due to late presentation and minimal deficits.  2D echo unremarkable.  Recommended DAPT for 3 weeks and aspirin alone.  HTN stable.  Initiated Crestor 5 mg daily for HLD management with LDL 107.  Recommended follow-up with PCP for uncontrolled DM with A1c 7.3.  Requested pacemaker interrogation to assess for potential atrial fibrillation.  Other stroke risk factors include advanced age and obesity but no prior history of stroke.  Incidental finding of 2.8  cm heterogeneous right thyroid nodule and recommended follow-up with PCP outpatient.  Discharged home in stable condition without therapy needs.  She declines residual deficits -feels she has returned completely back to her baseline and denies any residual visual deficits She has returned back to all prior activities including driving without difficulty She does endorse mild memory loss which has been present prior to her stroke which consists of forgetting people's names such as TV or movie stars or she will drive to town and when she arrives, she will forget what she needs to do. She does endorse daily fatigue and will nap daily if able depending on her schedule.  She denies insomnia or difficulty sleeping at night.  She does endorse snoring and has been told she will have flailing of extremities or will scream out her cry while she is still sleeping.  She also endorses vivid dreams typically consisting of nightmares.  She has not previously underwent sleep study Completed 3 weeks DAPT and continues on aspirin alone without bleeding or bruising Continues on Crestor 5 mg but was changed from daily to every other day approximately 3 months ago as recommended by PCP due to initially experiencing possible myalgias -she does endorse improvement since taking every other day.  She has not had repeat lipid panel since initiation of Crestor Glucose levels stable recent A1c 6.8 Blood pressure satisfactory at 121/72 Pacemaker interrogated which was negative for atrial fibrillation Denies new or worsening stroke/TIA symptoms   ROS:   14 system review of systems performed and negative with  exception of blurred vision, joint pain, ecchymosis, allergies, runny nose, memory loss, confusion, headache, dizziness, too much like his ligaments  PMH:  Past Medical History:  Diagnosis Date   Anxiety    Arthritis    of the neck   Colon polyps    Esophageal stricture    GERD (gastroesophageal reflux disease)     Hiatal Hernia   History of uterine cancer    Hyperlipidemia    Hypertension    Osteoporosis    Pacemaker    Symptomatic bradycardia     PSH:  Past Surgical History:  Procedure Laterality Date   APPENDECTOMY     CHOLECYSTECTOMY     LAPAROSCOPIC TOTAL HYSTERECTOMY     LYMPH NODE DISSECTION     bilateral pelvic and right-sided periaortic   PACEMAKER GENERATOR CHANGE N/A 05/21/2014   Procedure: PACEMAKER GENERATOR CHANGE;  Surgeon: Evans Lance, MD;  Location: [facility] CATH LAB;  Service: Cardiovascular;  Laterality: N/A;   status post pacemaker     metronic [id number]   TRANSTHORACIC ECHOCARDIOGRAM  08/30/04   TUBAL LIGATION      Social History:  Social History   Socioeconomic History   Marital status: Divorced    Spouse name: Not on file   Number of children: 1   Years of education: GED   Highest education level: GED or equivalent  Occupational History   Occupation: retired    Comment: Office manager strain: Not hard at all   Food insecurity    Worry: Never true    Inability: Never true   Transportation needs    Medical: No    Non-medical: No  Tobacco Use   Smoking status: Never Smoker   Smokeless tobacco: Never Used  Substance and Sexual Activity   Alcohol use: No    Comment: may have a social drink rarely   Drug use: No   Sexual activity: Not Currently  Lifestyle   Physical activity    Days per week: 3 days    Minutes per session: 20 min   Stress: Not at all  Relationships   Social connections    Talks on phone: More than three times a week    Gets together: More than three times a week    Attends religious service: More than 4 times per year    Active member of club or organization: Yes    Attends meetings of clubs or organizations: More than 4 times per year    Relationship status: Living with partner   Intimate partner violence    Fear of current or ex partner: No    Emotionally  abused: No    Physically abused: No    Forced sexual activity: No  Other Topics Concern   Not on file  Social History Narrative   Not on file    Family History:  Family History  Problem Relation Age of Onset   Heart disease Mother    Heart attack Mother    Heart disease Father    Heart attack Father    Cancer Sister        breast   Alcohol abuse Brother    COPD Sister    Healthy Daughter     Medications:   Current Outpatient Medications on File Prior to Visit  Medication Sig Dispense Refill   aspirin EC 81 MG EC tablet Take 1 tablet (81 mg total) by mouth daily. 30 tablet 0   Blood Glucose Monitoring Suppl (  ONE TOUCH ULTRA 2) w/Device KIT 1 Device by Does not apply route daily. 1 each 0   BYSTOLIC 5 MG tablet TAKE  (1)  TABLET TWICE A DAY. 60 tablet 11   calcium citrate-vitamin D (CITRACAL+D) 315-200 MG-UNIT per tablet Take 1 tablet by mouth daily.      glucose blood test strip Use to check BG up to qd 100 each 2   metFORMIN (GLUCOPHAGE) 500 MG tablet TAKE (1) TABLET DAILY WITH BREAKFAST. 90 tablet 3   ONETOUCH DELICA LANCETS FINE MISC Use to check BG up to qd 100 each 2   pantoprazole (PROTONIX) 40 MG tablet Take 1 tablet (40 mg total) by mouth daily. 90 tablet 3   rosuvastatin (CRESTOR) 5 MG tablet Take 1 tablet (5 mg total) by mouth daily at 6 PM. 30 tablet 4   sertraline (ZOLOFT) 50 MG tablet Take 1 tablet (50 mg total) by mouth daily. 90 tablet 3   sitaGLIPtin (JANUVIA) 100 MG tablet Take 1 tablet (100 mg total) by mouth daily. 90 tablet 3   [DISCONTINUED] metoprolol succinate (TOPROL-XL) 25 MG 24 hr tablet Take 1 tablet (25 mg total) by mouth daily. 90 tablet 3   No current facility-administered medications on file prior to visit.     Allergies:   Allergies  Allergen Reactions   Pravachol [Pravastatin Sodium] Other (See Comments)    Stomach pain and constipation      Physical Exam  Vitals:   07/27/19 1248  BP: 121/72  Pulse: 72    Temp: 98 F (36.7 C)  TempSrc: Oral  Weight: 161 lb (73 kg)  Height: _0  (1.6 m)   Body mass index is 28.52 kg/m. No exam data present  Depression screen [facility] 2/9 07/27/2019  Decreased Interest 0  Down, Depressed, Hopeless 0  PHQ - 2 Score 0  Altered sleeping -  Tired, decreased energy -  Change in appetite -  Feeling bad or failure about yourself  -  Trouble concentrating -  Moving slowly or fidgety/restless -  Suicidal thoughts -  PHQ-9 Score -  Difficult doing work/chores -     General: well developed, well nourished,  pleasant middle-age Caucasian female, seated, in no evident distress Head: head normocephalic and atraumatic.   Neck: supple with no carotid or supraclavicular bruits Cardiovascular: regular rate and rhythm, no murmurs Musculoskeletal: no deformity Skin:  no rash/petichiae Vascular:  Normal pulses all extremities   Neurologic Exam Mental Status: Awake and fully alert. Oriented to place and time. Recent and remote memory intact. Attention span, concentration and fund of knowledge appropriate. Mood and affect appropriate.  Cranial Nerves: Fundoscopic exam reveals sharp disc margins. Pupils equal, briskly reactive to light. Extraocular movements full without nystagmus. Visual fields full to confrontation.  Unable to appreciate any type of visual loss or difficulty.  Hearing intact. Facial sensation intact. Face, tongue, palate moves normally and symmetrically.  Motor: Normal bulk and tone. Normal strength in all tested extremity muscles. Sensory.: intact to touch , pinprick , position and vibratory sensation.  Coordination: Rapid alternating movements normal in all extremities. Finger-to-nose and heel-to-shin performed accurately bilaterally. Gait and Station: Arises from chair without difficulty. Stance is normal. Gait demonstrates normal stride length and balance Reflexes: 1+ and symmetric. Toes downgoing.     NIHSS  0 Modified Rankin   0    Diagnostic Data (Labs, Imaging, Testing)  Ct Angio Head W Or Wo Contrast Ct Angio Neck W And/or Wo Contrast 02/13/2019 IMPRESSION:  1. Negative  CTA for large vessel occlusion. Wide patency of the major arterial vasculature of the head and neck.  2. Moderate atheromatous irregularity involving the PCAs bilaterally, left greater than right.  3. Additional minor for age atherosclerotic change elsewhere as above. No other hemodynamically significant or correctable stenosis.  4. 2.8 cm heterogeneous right thyroid nodule. Follow-up examination with nonemergent outpatient thyroid ultrasound recommended for further characterization.    Ct Head Wo Contrast 02/14/2019 IMPRESSION:  Stable distribution of late acute to subacute infarction within the left occipital lobe. No new acute intracranial abnormality identified.    Ct Head Wo Contrast 02/13/2019 IMPRESSION:  New focal hypodensity in the left occipital lobe. This most likely represents a completed infarction in the left PCA distribution, however, neoplasm can not be excluded. Contrast-enhanced MRI is recommended, as well as neurology consultation. (see below)   Ct Head W Contrast 02/13/2019 IMPRESSION:  After contrast administration, the left occipital low-density shows findings consistent with an acute/subacute occipital stroke. There is no finding to suggest the presence of an underlying mass lesion.   ECHOCARDIOGRAM 02/14/2019 IMPRESSIONS  1. The left ventricle has normal systolic function with an ejection fraction of 60-65%. The cavity size was normal. Left ventricular diastology could not be evaluated due to nondiagnostic images.  2. The right ventricle has normal systolic function. The cavity was normal. There is no increase in right ventricular wall thickness.  3. Left atrial size was mildly dilated.  4. The mitral valve is normal in structure. There is mild to moderate mitral annular calcification present.  5. The  tricuspid valve is normal in structure.  6. The aortic valve is tricuspid Mild thickening of the aortic valve Moderate calcification of the aortic valve.  7. The pulmonic valve was normal in structure.    ASSESSMENT: [name] is a 68 y.o. year old female presented with visual loss on 02/13/2019 with a stroke work-up revealing left occipital infarct secondary to left PCA stenosis versus small vessel disease. Vascular risk factors include HTN, HLD, and DM.  Recovered well from a stroke standpoint without residual deficits.    PLAN:  1. Left occipital infarct: Continue aspirin 81 mg daily  and Crestor 5 mg every other day for secondary stroke prevention. Maintain strict control of hypertension with blood pressure goal below 130/90, diabetes with hemoglobin A1c goal below 6.5% and cholesterol with LDL cholesterol (bad cholesterol) goal below 70 mg/dL.  I also advised the patient to eat a healthy diet with plenty of whole grains, cereals, fruits and vegetables, exercise regularly with at least 30 minutes of continuous activity daily and maintain ideal body weight. 2. HTN: Advised to continue current treatment regimen.  Today's BP satisfactory.  Advised to continue to monitor at home along with continued follow-up with PCP for management 3. HLD: Advised continuation of current statin at this time but will check lipid panel today to ensure adequate management with potential need of changing therapy if LDL greater than 70 4. DMII: Advised to continue to monitor glucose levels at home along with continued follow-up with PCP for management and monitoring 5. Suspect underlying sleep disorder: Referral placed to [facility] for further evaluation and potential underlying sleep disorder such as sleep apnea or RBD    Follow up in 4 months or call earlier if needed   Greater than 50% of time during this 45 minute visit was spent on counseling, explanation of diagnosis of left occipital infarct,  reviewing risk factor management of HTN, HLD and DM, planning  of further management along with potential future management, and discussion with patient and family answering all questions.    Venancio Poisson, AGNP-BC  [facility] [address] [place],  [address]  Phone [phone] Fax [phone] Note: This document was prepared with digital dictation and possible smart phrase technology. Any transcriptional errors that result from this process are unintentional.

## 2019-07-27 NOTE — Patient Instructions (Addendum)
Continue aspirin 81 mg daily  and Crestor for secondary stroke prevention  We will check cholesterol levels today to ensure adequate management  Continue to follow up with PCP regarding cholesterol, blood pressure and diabetes management   Continue stay active and maintain a healthy diet  Continue to monitor blood pressure at home  Referral placed for sleep evaluation -you will be contacted to schedule visit  Maintain strict control of hypertension with blood pressure goal below 130/90, diabetes with hemoglobin A1c goal below 6.5% and cholesterol with LDL cholesterol (bad cholesterol) goal below 70 mg/dL. I also advised the patient to eat a healthy diet with plenty of whole grains, cereals, fruits and vegetables, exercise regularly and maintain ideal body weight.  Followup in the future with me in 4 months or call earlier if needed       Thank you for coming to see [place] at [facility]. I hope we have been able to provide you high quality care today.  You may receive a patient satisfaction survey over the next few weeks. We would appreciate your feedback and comments so that we may continue to improve ourselves and the health of our patients.   Stroke Prevention Some medical conditions and lifestyle choices can lead to a higher risk for a stroke. You can help to prevent a stroke by making nutrition, lifestyle, and other changes. What nutrition changes can be made?   Eat healthy foods. ? Choose foods that are high in fiber. These include:  Fresh fruits.  Fresh vegetables.  Whole grains. ? Eat at least 5 or more servings of fruits and vegetables each day. Try to fill half of your plate at each meal with fruits and vegetables. ? Choose lean protein foods. These include:  Lowfat (lean) cuts of meat.  Chicken without skin.  Fish.  Tofu.  Beans.  Nuts. ? Eat low-fat dairy products. ? Avoid foods that:  Are high in salt (sodium).  Have saturated  fat.  Have trans fat.  Have cholesterol.  Are processed.  Are premade.  Follow eating guidelines as told by your doctor. These may include: ? Reducing how many calories you eat and drink each day. ? Limiting how much salt you eat or drink each day to 1,500 milligrams (mg). ? Using only healthy fats for cooking. These include:  Olive oil.  Canola oil.  Sunflower oil. ? Counting how many carbohydrates you eat and drink each day. What lifestyle changes can be made?  Try to stay at a healthy weight. Talk to your doctor about what a good weight is for you.  Get at least 30 minutes of moderate physical activity at least 5 days a week. This can include: ? Fast walking. ? Biking. ? Swimming.  Do not use any products that have nicotine or tobacco. This includes cigarettes and e-cigarettes. If you need help quitting, ask your doctor. Avoid being around tobacco smoke in general.  Limit how much alcohol you drink to no more than 1 drink a day for nonpregnant women and 2 drinks a day for men. One drink equals 12 oz of beer, 5 oz of wine, or 1 oz of hard liquor.  Do not use drugs.  Avoid taking birth control pills. Talk to your doctor about the risks of taking birth control pills if: ? You are over 86 years old. ? You smoke. ? You get migraines. ? You have had a blood clot. What other changes can be made?  Manage your cholesterol. ?  It is important to eat a healthy diet. ? If your cholesterol cannot be managed through your diet, you may also need to take medicines. Take medicines as told by your doctor.  Manage your diabetes. ? It is important to eat a healthy diet and to exercise regularly. ? If your blood sugar cannot be managed through diet and exercise, you may need to take medicines. Take medicines as told by your doctor.  Control your high blood pressure (hypertension). ? Try to keep your blood pressure below 130/80. This can help lower your risk of stroke. ? It is  important to eat a healthy diet and to exercise regularly. ? If your blood pressure cannot be managed through diet and exercise, you may need to take medicines. Take medicines as told by your doctor. ? Ask your doctor if you should check your blood pressure at home. ? Have your blood pressure checked every year. Do this even if your blood pressure is normal.  Talk to your doctor about getting checked for a sleep disorder. Signs of this can include: ? Snoring a lot. ? Feeling very tired.  Take over-the-counter and prescription medicines only as told by your doctor. These may include aspirin or blood thinners (antiplatelets or anticoagulants).  Make sure that any other medical conditions you have are managed. Where to find more information  American Stroke Association: www.strokeassociation.org  National Stroke Association: www.stroke.org Get help right away if:  You have any symptoms of stroke. "BE FAST" is an easy way to remember the main warning signs: ? B - Balance. Signs are dizziness, sudden trouble walking, or loss of balance. ? E - Eyes. Signs are trouble seeing or a sudden change in how you see. ? F - Face. Signs are sudden weakness or loss of feeling of the face, or the face or eyelid drooping on one side. ? A - Arms. Signs are weakness or loss of feeling in an arm. This happens suddenly and usually on one side of the body. ? S - Speech. Signs are sudden trouble speaking, slurred speech, or trouble understanding what people say. ? T - Time. Time to call emergency services. Write down what time symptoms started.  You have other signs of stroke, such as: ? A sudden, very bad headache with no known cause. ? Feeling sick to your stomach (nausea). ? Throwing up (vomiting). ? Jerky movements you cannot control (seizure). These symptoms may represent a serious problem that is an emergency. Do not wait to see if the symptoms will go away. Get medical help right away. Call your local  emergency services (911 in the [place] Do not drive yourself to the hospital. Summary  You can prevent a stroke by eating healthy, exercising, not smoking, drinking less alcohol, and treating other health problems, such as diabetes, high blood pressure, or high cholesterol.  Do not use any products that contain nicotine or tobacco, such as cigarettes and e-cigarettes.  Get help right away if you have any signs or symptoms of a stroke. This information is not intended to replace advice given to you by your health care provider. Make sure you discuss any questions you have with your health care provider. Document Released: 06/03/2012 Document Revised: 01/29/2019 Document Reviewed: 03/06/2017 Elsevier Patient Education  2020 [employer].

## 2019-07-28 ENCOUNTER — Telehealth: Payer: Self-pay | Admitting: *Deleted

## 2019-07-28 LAB — LIPID PANEL
Chol/HDL Ratio: 3.8 ratio (ref 0.0–4.4)
Cholesterol, Total: 177 mg/dL (ref 100–199)
HDL: 47 mg/dL (ref >39)
LDL Calculated: 81 mg/dL (ref 0–99)
Triglycerides: 246 mg/dL — ABNORMAL HIGH (ref 0–149)
VLDL Cholesterol Cal: 49 mg/dL — ABNORMAL HIGH (ref 5–40)

## 2019-07-28 NOTE — Telephone Encounter (Signed)
-----   Message from Venancio Poisson, NP sent at 07/28/2019  8:46 AM EDT ----- Please advise patient that her recent lipid panel showed LDL or bad cholesterol 81 with goal less than 70.  Discussion regarding dietary changes and follow-up with PCP for repeat lipid panel in 2 to 3 months.  If LDL remains above 70, may need to consider change of statin therapy as she is unable to tolerate daily dose of statin

## 2019-07-28 NOTE — Telephone Encounter (Signed)
Relayed that calling re: lab results.  May return call for these and I will also forward message in [place] about this as well.

## 2019-07-29 ENCOUNTER — Telehealth: Payer: Self-pay | Admitting: Family Medicine

## 2019-07-29 NOTE — Telephone Encounter (Signed)
We do not have any samples.    Left message to please call back if wanting to change medication.

## 2019-07-29 NOTE — Telephone Encounter (Signed)
We can change to glipizide xl 5mg  daily.  Please make sure she has f/u for DM so that we can check her A1c given new rx.

## 2019-07-29 NOTE — Telephone Encounter (Signed)
Pt aware.

## 2019-07-29 NOTE — Telephone Encounter (Signed)
No samples, please change per pt. Call to [facility]

## 2019-07-30 ENCOUNTER — Telehealth: Payer: Self-pay | Admitting: Family Medicine

## 2019-07-30 DIAGNOSIS — E119 Type 2 diabetes mellitus without complications: Secondary | ICD-10-CM

## 2019-07-30 NOTE — Telephone Encounter (Signed)
Patient states that Januvia is to expensive around $300 for 90 day supply.  Patient wanting to know if she can double up on metformin or possibly have a cheaper medication sent to pharmacy

## 2019-07-31 ENCOUNTER — Other Ambulatory Visit: Payer: Self-pay | Admitting: Family Medicine

## 2019-07-31 DIAGNOSIS — E119 Type 2 diabetes mellitus without complications: Secondary | ICD-10-CM

## 2019-07-31 MED ORDER — GLIPIZIDE ER 5 MG PO TB24: 5.0000 mg | ORAL_TABLET | Freq: Every day | ORAL | 0 refills | Status: DC

## 2019-07-31 MED ORDER — METFORMIN HCL 500 MG PO TABS: 500.0000 mg | ORAL_TABLET | Freq: Two times a day (BID) | ORAL | 3 refills | Status: DC

## 2019-07-31 NOTE — Telephone Encounter (Signed)
I will send in Glipizide 5mg  XL.  Continue Metformin separately.

## 2019-07-31 NOTE — Telephone Encounter (Signed)
Metformin sent in as 500mg  twice daily with meals (this is an increase from previous rx).  See me in 1 month for diabetes and A1c check.

## 2019-08-03 ENCOUNTER — Telehealth: Payer: Self-pay | Admitting: *Deleted

## 2019-08-03 NOTE — Telephone Encounter (Signed)
I called and relayed to pt the result note per Jessica/NP about her lipid panel and plan of care. I will send note to Dr. Lajuana Ripple as well. Pt verbalized understanding.

## 2019-08-03 NOTE — Telephone Encounter (Signed)
-----   Message from Claris Gower, NP sent at 07/28/2019  8:46 AM EDT ----- Please advise patient that her recent lipid panel showed LDL or bad cholesterol 81 with goal less than 70.  Discussion regarding dietary changes and follow-up with PCP for repeat lipid panel in 2 to 3 months.  If LDL remains above 70, may need to consider change of statin therapy as she is unable to tolerate daily dose of statin

## 2019-08-06 NOTE — Telephone Encounter (Signed)
Patient aware and appt made 

## 2019-08-12 ENCOUNTER — Ambulatory Visit: Payer: PPO | Admitting: Neurology

## 2019-08-12 ENCOUNTER — Encounter: Payer: Self-pay | Admitting: Neurology

## 2019-08-12 ENCOUNTER — Other Ambulatory Visit: Payer: Self-pay

## 2019-08-12 VITALS — BP 143/80 | HR 67 | Ht 63.0 in | Wt 158.0 lb

## 2019-08-12 DIAGNOSIS — R351 Nocturia: Secondary | ICD-10-CM

## 2019-08-12 DIAGNOSIS — I639 Cerebral infarction, unspecified: Secondary | ICD-10-CM

## 2019-08-12 DIAGNOSIS — R51 Headache: Secondary | ICD-10-CM | POA: Diagnosis not present

## 2019-08-12 DIAGNOSIS — R0683 Snoring: Secondary | ICD-10-CM

## 2019-08-12 DIAGNOSIS — E663 Overweight: Secondary | ICD-10-CM

## 2019-08-12 DIAGNOSIS — G478 Other sleep disorders: Secondary | ICD-10-CM

## 2019-08-12 DIAGNOSIS — R519 Headache, unspecified: Secondary | ICD-10-CM

## 2019-08-12 NOTE — Patient Instructions (Addendum)
Thank you for choosing [facility] for your sleep related care! It was nice to meet you today! I appreciate that you entrust me with your sleep related healthcare concerns. I hope, I was able to address at least some of your concerns today, and that I can help you feel reassured and also get better.    Here is what we discussed today and what we came up with as our plan for you:    Based on your symptoms and your exam I believe you may be at some risk for obstructive sleep apnea (aka OSA), and I think we should proceed with a sleep study to determine whether you do or do not have OSA and how severe it is. Even, if you have mild OSA, I may want you to consider treatment with CPAP, as treatment of even borderline or mild sleep apnea can result and improvement of symptoms such as sleep disruption, daytime sleepiness, nighttime bathroom breaks, restless leg symptoms, improvement of headache syndromes, even improved mood disorder. I understand that you would like to think about it.  Please call us or email [place] when you are ready to pursue sleep study testing.  I will place an order at the time and see you in follow-up after testing.  Please remember, the long-term risks and ramifications of untreated moderate to severe obstructive sleep apnea are: increased Cardiovascular disease, including congestive heart failure, stroke, difficult to control hypertension, treatment resistant obesity, arrhythmias, especially irregular heartbeat commonly known as A. Fib. (atrial fibrillation); even type 2 diabetes has been linked to untreated OSA.   Sleep apnea can cause disruption of sleep and sleep deprivation in most cases, which, in turn, can cause recurrent headaches, problems with memory, mood, concentration, focus, and vigilance. Most people with untreated sleep apnea report excessive daytime sleepiness, which can affect their ability to drive. Please do not drive if you feel sleepy. Patients with sleep  apnea developed difficulty initiating and maintaining sleep (aka insomnia).   Having sleep apnea may increase your risk for other sleep disorders, including involuntary behaviors sleep such as sleep terrors, sleep talking, sleepwalking.    Having sleep apnea can also increase your risk for restless leg syndrome and leg movements at night.   Please note that untreated obstructive sleep apnea may carry additional perioperative morbidity. Patients with significant obstructive sleep apnea (typically, in the moderate to severe degree) should receive, if possible, perioperative PAP (positive airway pressure) therapy and the surgeons and particularly the anesthesiologists should be informed of the diagnosis and the severity of the sleep disordered breathing.

## 2019-08-12 NOTE — Progress Notes (Signed)
Subjective:    Patient ID: [name] is a 68 y.o. female.  HPI    Stephanie Age, MD, PhD [facility] [address], [address] [place], [place] [address]  ear Janett Billow, I saw your patient, [name], upon your kind request to my sleep clinic today for initial consultation of her sleep disorder, in particular, concern for underlying obstructive sleep apnea.  The patient is unaccompanied today.  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  I reviewed your office note from 07/27/2019.  Her Epworth sleepiness score is 6 out of 24, fatigue severity score is 37 out of 63.  She has recuperated well from her stroke, no residual symptoms, still has some occasional visual symptoms but otherwise feels back to baseline.  She does not have a whole lot of sleep-related complaints, but has had the occasional morning headache and has nocturia about once per average night.  She is not aware of any family history of OSA.  Her boyfriend reports that she may mumble in her sleep and cry out sometimes.  She does recall having had some bad dreams and vivid dreams.  This has been going on for the past couple of years.  She is a non-smoker and does not utilize alcohol and drinks caffeine on limitation, 1 cup of coffee in the morning and one soda per day.  Bedtime is late, around midnight and rise time around 830.  She is retired from working at a Engineer, materials.  She has 1 grown daughter.  She lives with her boyfriend, she also tends to nap in the afternoons, sometimes 1 and sometimes 2  hours, typically somewhere between 130 and 3 PM.  She had a tonsillectomy as a child.  She has no difficulty falling asleep or staying asleep.  Her Past Medical History Is Significant For: Past Medical History:  Diagnosis Date  . Anxiety   . Arthritis    of the neck  . Colon polyps   . Esophageal stricture   . GERD (gastroesophageal reflux disease)    Hiatal Hernia  . History of uterine cancer   . Hyperlipidemia   . Hypertension   . Osteoporosis   . Pacemaker   . Symptomatic bradycardia     Her Past Surgical History Is Significant For: Past Surgical History:  Procedure Laterality Date  . APPENDECTOMY    . CHOLECYSTECTOMY    . LAPAROSCOPIC TOTAL HYSTERECTOMY    . LYMPH NODE DISSECTION     bilateral pelvic and right-sided periaortic  . PACEMAKER GENERATOR CHANGE N/A 05/21/2014   Procedure: PACEMAKER GENERATOR CHANGE;  Surgeon: Evans Lance, MD;  Location: [facility] CATH LAB;  Service: Cardiovascular;  Laterality: N/A;  . status post pacemaker     metronic [id number]  . TRANSTHORACIC ECHOCARDIOGRAM  08/30/04  . TUBAL LIGATION      Her Family History Is Significant For: Family History  Problem Relation [name] of Onset  . Heart disease Mother   . Heart attack Mother   . Heart disease Father   . Heart attack Father   . Cancer Sister        breast  . Alcohol abuse Brother   .  COPD Sister   . Healthy Daughter     Her Social History Is Significant For: Social History   Socioeconomic History  . Marital status: Divorced    Spouse name: Not on file  . Number of children: 1  . Years of education: GED  . Highest education level: GED or equivalent  Occupational History  . Occupation: retired    Comment: Engineer, manufacturing systems  Social Needs  . Financial resource strain: Not hard at all  . Food insecurity    Worry: Never true    Inability: Never true  . Transportation needs    Medical: No    Non-medical: No  Tobacco Use  . Smoking status: Never Smoker  . Smokeless tobacco: Never  Used  Substance and Sexual Activity  . Alcohol use: No    Comment: may have a social drink rarely  . Drug use: No  . Sexual activity: Not Currently  Lifestyle  . Physical activity    Days per week: 3 days    Minutes per session: 20 min  . Stress: Not at all  Relationships  . Social connections    Talks on phone: More than three times a week    Gets together: More than three times a week    Attends religious service: More than 4 times per year    Active member of club or organization: Yes    Attends meetings of clubs or organizations: More than 4 times per year    Relationship status: Living with partner  Other Topics Concern  . Not on file  Social History Narrative  . Not on file    Her Allergies Are:  Allergies  Allergen Reactions  . Pravachol [Pravastatin Sodium] Other (See Comments)    Stomach pain and constipation   :   Her Current Medications Are:  Outpatient Encounter Medications as of 08/12/2019  Medication Sig  . aspirin EC 81 MG EC tablet Take 1 tablet (81 mg total) by mouth daily.  . Blood Glucose Monitoring Suppl (ONE TOUCH ULTRA 2) w/Device KIT 1 Device by Does not apply route daily.  Marland Kitchen BYSTOLIC 5 MG tablet TAKE  (1)  TABLET TWICE A DAY.  . calcium citrate-vitamin D (CITRACAL+D) 315-200 MG-UNIT per tablet Take 1 tablet by mouth daily.   Marland Kitchen glipiZIDE (GLUCOTROL XL) 5 MG 24 hr tablet Take 1 tablet (5 mg total) by mouth daily with breakfast.  . glucose blood test strip Use to check BG up to qd  . metFORMIN (GLUCOPHAGE) 500 MG tablet Take 1 tablet (500 mg total) by mouth 2 (two) times daily with a meal.  . ONETOUCH DELICA LANCETS FINE MISC Use to check BG up to qd  . pantoprazole (PROTONIX) 40 MG tablet Take 1 tablet (40 mg total) by mouth daily.  . rosuvastatin (CRESTOR) 5 MG tablet Take 1 tablet (5 mg total) by mouth daily at 6 PM.  . sertraline (ZOLOFT) 50 MG tablet Take 1 tablet (50 mg total) by mouth daily.  . [DISCONTINUED] metoprolol succinate (TOPROL-XL)  25 MG 24 hr tablet Take 1 tablet (25 mg total) by mouth daily.   No facility-administered encounter medications on file as of 08/12/2019.   :  Review of Systems:  Out of a complete 14 point review of systems, all are reviewed and negative with the exception of these symptoms as listed below: Review of Systems  Neurological:       Pt presents today to discuss her sleep. Pt has never had a sleep study  but does endorse snoring.  Epworth Sleepiness Scale 0= would never doze 1= slight chance of dozing 2= moderate chance of dozing 3= high chance of dozing  Sitting and reading: 0 Watching TV: 1 Sitting inactive in a public place (ex. Theater or meeting): 0 As a passenger in a car for an hour without a break: 1 Lying down to rest in the afternoon: 3 Sitting and talking to someone: 0 Sitting quietly after lunch (no alcohol): 1 In a car, while stopped in traffic: 0 Total: 6     Objective:  Neurological Exam  Physical Exam Physical Examination:   Vitals:   08/12/19 1112  BP: (!) 143/80  Pulse: 67   General Examination: The patient is a very pleasant 67 y.o. female in no acute distress. She appears well-developed and well-nourished and well groomed.   HEENT: Normocephalic, atraumatic, pupils are equal, round and reactive to light, ? Mild lazy eye. Corrective eyeglasses. Extraocular tracking is good without limitation to gaze excursion or nystagmus noted. Hearing is grossly intact. Face is symmetric with normal facial animation and normal facial sensation. Speech is clear with no dysarthria noted. There is no hypophonia. There is no lip, neck/head, jaw or voice tremor. Neck is supple with full range of passive and active motion. There are no carotid bruits on auscultation. Oropharynx exam reveals: mild mouth dryness, adequate dental hygiene and mild airway crowding, due to Fairly small airway entry and redundant soft palate, tonsils absent, Mallampati is class II.  She has improved  overbite.  Tongue protrudes centrally and palate elevates symmetrically.  Neck circumference is 14 inches.   Chest: Clear to auscultation without wheezing, rhonchi or crackles noted.  Heart: S1+S2+0, regular and normal without murmurs, rubs or gallops noted.   Abdomen: Soft, non-tender and non-distended with normal bowel sounds appreciated on auscultation.  Extremities: There is no pitting edema in the distal lower extremities bilaterally.  Skin: Warm and dry without trophic changes noted.  Musculoskeletal: exam reveals no obvious joint deformities, tenderness or joint swelling or erythema.   Neurologically:  Mental status: The patient is awake, alert and oriented in all 4 spheres. Her immediate and remote memory, attention, language skills and fund of knowledge are appropriate. There is no evidence of aphasia, agnosia, apraxia or anomia. Speech is clear with normal prosody and enunciation. Thought process is linear. Mood is normal and affect is normal.  Cranial nerves II - XII are as described above under HEENT exam. In addition: shoulder shrug is normal with equal shoulder height noted. Motor exam: Normal bulk, strength and tone is noted. There is no tremor. Fine motor skills and coordination: grossly intact.  Cerebellar testing: No dysmetria or intention tremor on finger to nose testing. Heel to shin is unremarkable bilaterally. There is no truncal or gait ataxia.  Sensory exam: intact to light touch.  Gait, station and balance: She stands easily. No veering to one side is noted. No leaning to one side is noted. Posture is [name]-appropriate and stance is narrow based. Gait shows normal stride length and normal pace. No problems turning are noted.   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 While she does not have a telltale history in   keeping with obstructive sleep apnea (OSA), sleep study testing is recommended to Rule out underlying obstructive sleep apnea as a possible risk factor for cardiovascular and cerebrovascular disease in her case.  I discussed this with her.  I talked to her about the sleep study process.  I explained in particular the risks and ramifications of untreated moderate to severe OSA, especially with respect to developing cardiovascular disease down the Road, including congestive heart failure, difficult to treat hypertension, cardiac arrhythmias, or stroke. Even type 2 diabetes has, in part, been linked to untreated OSA. Symptoms of untreated OSA include daytime sleepiness, memory problems, mood irritability and mood disorder such as depression and anxiety, lack of energy, as well as recurrent headaches, especially morning headaches.  She would like to think about sleep study testing.  She is encouraged to call or email us once she is ready to pursue testing.  I plan to see her back after sleep study testing.  I answered all her questions today and she was in agreement.  Thank you very much for allowing me to participate in the care of this nice patient. If I can be of any further assistance to you please do not hesitate to talk to me.   Saima Athar, MD, PhD   

## 2019-08-20 ENCOUNTER — Ambulatory Visit (INDEPENDENT_AMBULATORY_CARE_PROVIDER_SITE_OTHER): Payer: PPO | Admitting: *Deleted

## 2019-08-20 DIAGNOSIS — I495 Sick sinus syndrome: Secondary | ICD-10-CM | POA: Diagnosis not present

## 2019-08-20 LAB — CUP PACEART REMOTE DEVICE CHECK
Battery Impedance: 275 Ohm
Battery Remaining Longevity: 125 mo
Battery Voltage: 2.78 V
Brady Statistic AP VP Percent: 0 %
Brady Statistic AP VS Percent: 0 %
Brady Statistic AS VP Percent: 0 %
Brady Statistic AS VS Percent: 100 %
Date Time Interrogation Session: 20200903130504
Implantable Lead Implant Date: 20050914
Implantable Lead Implant Date: 20050914
Implantable Lead Location: 753859
Implantable Lead Location: 753860
Implantable Lead Model: 5076
Implantable Lead Model: 5076
Implantable Pulse Generator Implant Date: 20150605
Lead Channel Impedance Value: 502 Ohm
Lead Channel Impedance Value: 539 Ohm
Lead Channel Pacing Threshold Amplitude: 0.5 V
Lead Channel Pacing Threshold Amplitude: 0.75 V
Lead Channel Pacing Threshold Pulse Width: 0.4 ms
Lead Channel Pacing Threshold Pulse Width: 0.4 ms
Lead Channel Setting Pacing Amplitude: 2 V
Lead Channel Setting Pacing Amplitude: 2.5 V
Lead Channel Setting Pacing Pulse Width: 0.4 ms
Lead Channel Setting Sensing Sensitivity: 5.6 mV

## 2019-09-03 ENCOUNTER — Encounter: Payer: Self-pay | Admitting: Cardiology

## 2019-09-03 NOTE — Progress Notes (Signed)
Remote pacemaker transmission.   

## 2019-09-04 ENCOUNTER — Other Ambulatory Visit: Payer: Self-pay

## 2019-09-07 ENCOUNTER — Other Ambulatory Visit: Payer: Self-pay

## 2019-09-07 ENCOUNTER — Ambulatory Visit (INDEPENDENT_AMBULATORY_CARE_PROVIDER_SITE_OTHER): Payer: PPO | Admitting: Family Medicine

## 2019-09-07 ENCOUNTER — Encounter: Payer: Self-pay | Admitting: Family Medicine

## 2019-09-07 VITALS — BP 136/82 | HR 66 | Temp 97.7°F | Ht 63.0 in | Wt 160.0 lb

## 2019-09-07 DIAGNOSIS — Z23 Encounter for immunization: Secondary | ICD-10-CM | POA: Diagnosis not present

## 2019-09-07 DIAGNOSIS — E785 Hyperlipidemia, unspecified: Secondary | ICD-10-CM

## 2019-09-07 DIAGNOSIS — I10 Essential (primary) hypertension: Secondary | ICD-10-CM

## 2019-09-07 DIAGNOSIS — E1169 Type 2 diabetes mellitus with other specified complication: Secondary | ICD-10-CM

## 2019-09-07 DIAGNOSIS — I152 Hypertension secondary to endocrine disorders: Secondary | ICD-10-CM

## 2019-09-07 DIAGNOSIS — L570 Actinic keratosis: Secondary | ICD-10-CM

## 2019-09-07 DIAGNOSIS — E119 Type 2 diabetes mellitus without complications: Secondary | ICD-10-CM

## 2019-09-07 DIAGNOSIS — E1159 Type 2 diabetes mellitus with other circulatory complications: Secondary | ICD-10-CM

## 2019-09-07 LAB — BAYER DCA HB A1C WAIVED: HB A1C (BAYER DCA - WAIVED): 6.6 % (ref <7.0)

## 2019-09-07 MED ORDER — FLUCONAZOLE 150 MG PO TABS: 150.0000 mg | ORAL_TABLET | Freq: Once | ORAL | 0 refills | Status: AC

## 2019-09-07 NOTE — Progress Notes (Signed)
Subjective: CC: DM, HTN, HLD PCP: Janora Norlander, DO YTK:[id number] [name] is a 68 y.o. female presenting to clinic today for:  1. Type 2 Diabetes w/ hypertension/ hyperlipidemia:  Patient reports compliance with metformin 500 mg daily, glipizide XL 16m daily, Crestor 5 mg daily and Bystolic.  She does the Januvia became unaffordable.  Last eye exam: needs Last foot exam: needs Last A1c:  Lab Results  Component Value Date   HGBA1C 6.8 05/27/2019   Nephropathy screen indicated?: UTD Last flu, zoster and/or pneumovax:  Immunization History  Administered Date(s) Administered  . Influenza,inj,Quad PF,6+ Mos 10/29/2016, 01/03/2018, 10/08/2018  . Pneumococcal Conjugate-13 08/02/2017  . Pneumococcal Polysaccharide-23 08/04/2018    ROS: No chest pain, shortness of breath, lower extremity edema.  2.  Skin lesion Patient reports several month history of left-sided anterior skin lesion.  She notes that she often has picked it off and it has gotten irritated and or bled.  She is wondering if we can freeze this lesion today.  She has had it frozen before.  Denies any change in pigmentation but it does seem to change size intermittently.  ROS: Per HPI  Allergies  Allergen Reactions  . Pravachol [Pravastatin Sodium] Other (See Comments)    Stomach pain and constipation    Past Medical History:  Diagnosis Date  . Anxiety   . Arthritis    of the neck  . Colon polyps   . Esophageal stricture   . GERD (gastroesophageal reflux disease)    Hiatal Hernia  . History of uterine cancer   . Hyperlipidemia   . Hypertension   . Osteoporosis   . Pacemaker   . Symptomatic bradycardia     Current Outpatient Medications:  .  aspirin EC 81 MG EC tablet, Take 1 tablet (81 mg total) by mouth daily., Disp: 30 tablet, Rfl: 0 .  Blood Glucose Monitoring Suppl (ONE TOUCH ULTRA 2) w/Device KIT, 1 Device by Does not apply route daily., Disp: 1 each, Rfl: 0 .  BYSTOLIC 5 MG tablet, TAKE  (1)   TABLET TWICE A DAY., Disp: 60 tablet, Rfl: 11 .  calcium citrate-vitamin D (CITRACAL+D) 315-200 MG-UNIT per tablet, Take 1 tablet by mouth daily. , Disp: , Rfl:  .  glipiZIDE (GLUCOTROL XL) 5 MG 24 hr tablet, Take 1 tablet (5 mg total) by mouth daily with breakfast., Disp: 90 tablet, Rfl: 0 .  glucose blood test strip, Use to check BG up to qd, Disp: 100 each, Rfl: 2 .  metFORMIN (GLUCOPHAGE) 500 MG tablet, Take 1 tablet (500 mg total) by mouth 2 (two) times daily with a meal., Disp: 180 tablet, Rfl: 3 .  ONETOUCH DELICA LANCETS FINE MISC, Use to check BG up to qd, Disp: 100 each, Rfl: 2 .  pantoprazole (PROTONIX) 40 MG tablet, Take 1 tablet (40 mg total) by mouth daily., Disp: 90 tablet, Rfl: 3 .  rosuvastatin (CRESTOR) 5 MG tablet, Take 1 tablet (5 mg total) by mouth daily at 6 PM., Disp: 30 tablet, Rfl: 4 .  sertraline (ZOLOFT) 50 MG tablet, Take 1 tablet (50 mg total) by mouth daily., Disp: 90 tablet, Rfl: 3 Social History   Socioeconomic History  . Marital status: Divorced    Spouse name: Not on file  . Number of children: 1  . Years of education: GED  . Highest education level: GED or equivalent  Occupational History  . Occupation: retired    Comment: tEngineer, manufacturing systems Social Needs  . FEmergency planning/management officer  strain: Not hard at all  . Food insecurity    Worry: Never true    Inability: Never true  . Transportation needs    Medical: No    Non-medical: No  Tobacco Use  . Smoking status: Never Smoker  . Smokeless tobacco: Never Used  Substance and Sexual Activity  . Alcohol use: No    Comment: may have a social drink rarely  . Drug use: No  . Sexual activity: Not Currently  Lifestyle  . Physical activity    Days per week: 3 days    Minutes per session: 20 min  . Stress: Not at all  Relationships  . Social connections    Talks on phone: More than three times a week    Gets together: More than three times a week    Attends religious service: More than 4 times per year    Active  member of club or organization: Yes    Attends meetings of clubs or organizations: More than 4 times per year    Relationship status: Living with partner  . Intimate partner violence    Fear of current or ex partner: No    Emotionally abused: No    Physically abused: No    Forced sexual activity: No  Other Topics Concern  . Not on file  Social History Narrative  . Not on file   Family History  Problem Relation Age of Onset  . Heart disease Mother   . Heart attack Mother   . Heart disease Father   . Heart attack Father   . Cancer Sister        breast  . Alcohol abuse Brother   . COPD Sister   . Healthy Daughter     Objective: Office vital signs reviewed. BP 136/82   Pulse 66   Temp 97.7 F (36.5 C) (Temporal)   Ht _0  (1.6 m)   Wt 160 lb (72.6 kg)   SpO2 95%   BMI 28.34 kg/m   Physical Examination:  General: Awake, alert, well nourished, No acute distress HEENT: Normal, sclera white, MMM Cardio: regular rate and rhythm, S1S2 heard, no murmurs appreciated Pulm: clear to auscultation bilaterally, no wheezes, rhonchi or rales; normal work of breathing on room air Extremities: warm, well perfused, No edema, cyanosis or clubbing; +2 pulses bilaterally Skin: dry; intact; she has a flesh-colored, papular lesion noted along the left anterior thigh that is about 1 mm in size. Neuro: see DM foot exam below  Diabetic Foot Exam - Simple   Simple Foot Form Diabetic Foot exam was performed with the following findings: Yes 09/07/2019  1:46 PM  Visual Inspection No deformities, no ulcerations, no other skin breakdown bilaterally: Yes Sensation Testing Intact to touch and monofilament testing bilaterally: Yes Pulse Check Posterior Tibialis and Dorsalis pulse intact bilaterally: Yes Comments    Cryotherapy Procedure:  Risks and benefits of procedure were reviewed with the patient.  Written consent obtained and scanned into the chart.  Lesion of concern was identified and  located on left anterior thigh.  Liquid nitrogen was applied to area of concern and extending out 1.5 millimeters beyond the border of the lesion.  Treated area was allowed to come back to room temperature before treating it a second time.  Patient tolerated procedure well and there were no immediate complications.  Home care instructions were reviewed with the patient and a handout was provided.  Assessment/ Plan: 68 y.o. female   1. Type 2 diabetes mellitus  without complication, without long-term current use of insulin ([place]) Under excellent control with current therapy.  Continue current regimen - Bayer DCA Hb A1c Waived  2. Hypertension associated with diabetes ([place]) Controlled.  3. Hyperlipidemia associated with type 2 diabetes mellitus ([facility]) Check direct LDL - LDL Cholesterol, Direct  4. Actinic keratosis Treated with cryotherapy today for presumed actinic keratosis.  We discussed that alternative diagnoses include atypical cyst which may need excision if not responsive to cryotherapy.  She voiced good understanding.  5. Need for immunization against influenza Administered during today's visit - Flu Vaccine QUAD High Dose(Fluad)    No orders of the defined types were placed in this encounter.  No orders of the defined types were placed in this encounter.    Janora Norlander, DO [place] [phone]

## 2019-09-07 NOTE — Patient Instructions (Addendum)
Schedule your mammogram and diabetic eye exam Dr Marin Comment would probably see you now if you want.  Your sugar is under good control.  Keep up the good work.  Diabetes Mellitus and Standards of Medical Care Managing diabetes (diabetes mellitus) can be complicated. Your diabetes treatment may be managed by a team of health care providers, including:  A physician who specializes in diabetes (endocrinologist).  A nurse practitioner or physician assistant.  Nurses.  A diet and nutrition specialist (registered dietitian).  A certified diabetes educator (CDE).  An exercise specialist.  A pharmacist.  An eye doctor.  A foot specialist (podiatrist).  A dentist.  A primary care provider.  A mental health provider. Your health care providers follow guidelines to help you get the best quality of care. The following schedule is a general guideline for your diabetes management plan. Your health care providers may give you more specific instructions. Physical exams Upon being diagnosed with diabetes mellitus, and each year after that, your health care provider will ask about your medical and family history. He or she will also do a physical exam. Your exam may include:  Measuring your height, weight, and body mass index (BMI).  Checking your blood pressure. This will be done at every routine medical visit. Your target blood pressure may vary depending on your medical conditions, your age, and other factors.  Thyroid gland exam.  Skin exam.  Screening for damage to your nerves (peripheral neuropathy). This may include checking the pulse in your legs and feet and checking the level of sensation in your hands and feet.  A complete foot exam to inspect the structure and skin of your feet, including checking for cuts, bruises, redness, blisters, sores, or other problems.  Screening for blood vessel (vascular) problems, which may include checking the pulse in your legs and feet and checking your  temperature. Blood tests Depending on your treatment plan and your personal needs, you may have the following tests done:  HbA1c (hemoglobin A1c). This test provides information about blood sugar (glucose) control over the previous 2-3 months. It is used to adjust your treatment plan, if needed. This test will be done: ? At least 2 times a year, if you are meeting your treatment goals. ? 4 times a year, if you are not meeting your treatment goals or if treatment goals have changed.  Lipid testing, including total, LDL, and HDL cholesterol and triglyceride levels. ? The goal for LDL is less than 100 mg/dL (5.5 mmol/L). If you are at high risk for complications, the goal is less than 70 mg/dL (3.9 mmol/L). ? The goal for HDL is 40 mg/dL (2.2 mmol/L) or higher for men and 50 mg/dL (2.8 mmol/L) or higher for women. An HDL cholesterol of 60 mg/dL (3.3 mmol/L) or higher gives some protection against heart disease. ? The goal for triglycerides is less than 150 mg/dL (8.3 mmol/L).  Liver function tests.  Kidney function tests.  Thyroid function tests. Dental and eye exams  Visit your dentist two times a year.  If you have type 1 diabetes, your health care provider may recommend an eye exam 3-5 years after you are diagnosed, and then once a year after your first exam. ? For children with type 1 diabetes, a health care provider may recommend an eye exam when your child is age 70 or older and has had diabetes for 3-5 years. After the first exam, your child should get an eye exam once a year.  If you have  type 2 diabetes, your health care provider may recommend an eye exam as soon as you are diagnosed, and then once a year after your first exam. Immunizations   The yearly flu (influenza) vaccine is recommended for everyone 6 months or older who has diabetes.  The pneumonia (pneumococcal) vaccine is recommended for everyone 2 years or older who has diabetes. If you are 63 or older, you may get the  pneumonia vaccine as a series of two separate shots.  The hepatitis B vaccine is recommended for adults shortly after being diagnosed with diabetes.  Adults and children with diabetes should receive all other vaccines according to age-specific recommendations from the Centers for Disease Control and Prevention (CDC). Mental and emotional health Screening for symptoms of eating disorders, anxiety, and depression is recommended at the time of diagnosis and afterward as needed. If your screening shows that you have symptoms (positive screening result), you may need more evaluation and you may work with a mental health care provider. Treatment plan Your treatment plan will be reviewed at every medical visit. You and your health care provider will discuss:  How you are taking your medicines, including insulin.  Any side effects you are experiencing.  Your blood glucose target goals.  The frequency of your blood glucose monitoring.  Lifestyle habits, such as activity level as well as tobacco, alcohol, and substance use. Diabetes self-management education Your health care provider will assess how well you are monitoring your blood glucose levels and whether you are taking your insulin correctly. He or she may refer you to:  A certified diabetes educator to manage your diabetes throughout your life, starting at diagnosis.  A registered dietitian who can create or review your personal nutrition plan.  An exercise specialist who can discuss your activity level and exercise plan. Summary  Managing diabetes (diabetes mellitus) can be complicated. Your diabetes treatment may be managed by a team of health care providers.  Your health care providers follow guidelines in order to help you get the best quality of care.  Standards of care including having regular physical exams, blood tests, blood pressure monitoring, immunizations, screening tests, and education about how to manage your  diabetes.  Your health care providers may also give you more specific instructions based on your individual health. This information is not intended to replace advice given to you by your health care provider. Make sure you discuss any questions you have with your health care provider. Document Released: 09/30/2009 Document Revised: 08/22/2018 Document Reviewed: 08/31/2016 Elsevier Patient Education  2020 [employer].

## 2019-09-08 LAB — LDL CHOLESTEROL, DIRECT: LDL Direct: 67 mg/dL (ref 0–99)

## 2019-09-15 DIAGNOSIS — Z1231 Encounter for screening mammogram for malignant neoplasm of breast: Secondary | ICD-10-CM | POA: Diagnosis not present

## 2019-10-29 ENCOUNTER — Ambulatory Visit (INDEPENDENT_AMBULATORY_CARE_PROVIDER_SITE_OTHER): Payer: PPO | Admitting: Nurse Practitioner

## 2019-10-29 ENCOUNTER — Other Ambulatory Visit: Payer: Self-pay

## 2019-10-29 ENCOUNTER — Encounter: Payer: Self-pay | Admitting: Nurse Practitioner

## 2019-10-29 DIAGNOSIS — B3731 Acute candidiasis of vulva and vagina: Secondary | ICD-10-CM

## 2019-10-29 DIAGNOSIS — B373 Candidiasis of vulva and vagina: Secondary | ICD-10-CM | POA: Diagnosis not present

## 2019-10-29 MED ORDER — FLUCONAZOLE 150 MG PO TABS: ORAL_TABLET | ORAL | 0 refills | Status: DC

## 2019-10-29 NOTE — Progress Notes (Signed)
   Virtual Visit via telephone Note Due to COVID-19 pandemic this visit was conducted virtually. This visit type was conducted due to national recommendations for restrictions regarding the COVID-19 Pandemic (e.g. social distancing, sheltering in place) in an effort to limit this patient's exposure and mitigate transmission in our community. All issues noted in this document were discussed and addressed.  A physical exam was not performed with this format.  I connected with [name] on 10/29/19 at 2:30 by telephone and verified that I am speaking with the correct person using two identifiers. [name] is currently located at home and no one is currently with her during visit. The provider, Mary-Margaret Hassell Done, FNP is located in their office at time of visit.  I discussed the limitations, risks, security and privacy concerns of performing an evaluation and management service by telephone and the availability of in person appointments. I also discussed with the patient that there may be a patient responsible charge related to this service. The patient expressed understanding and agreed to proceed.   History and Present Illness:  Patient calls in c/o itching and burning in vaginal area. Has slight discharge. She has been using monistat OTC which has helped a little. She denies any recent antibiotic use.    Review of Systems  Constitutional: Negative for diaphoresis and weight loss.  Eyes: Negative for blurred vision, double vision and pain.  Respiratory: Negative for shortness of breath.   Cardiovascular: Negative for chest pain, palpitations, orthopnea and leg swelling.  Gastrointestinal: Negative for abdominal pain.  Genitourinary: Negative for dysuria, frequency and urgency.  Skin: Negative for rash.  Neurological: Negative for dizziness, sensory change, loss of consciousness, weakness and headaches.  Endo/Heme/Allergies: Negative for polydipsia. Does not bruise/bleed easily.   Psychiatric/Behavioral: Negative for memory loss. The patient does not have insomnia.   All other systems reviewed and are negative.    Observations/Objective: Alert and oriented- answers all questions appropriately No distress    Assessment and Plan: [name] in today with chief complaint of Urinary Tract Infection   1. Vaginal candidiasis Avoid bubble baths No harsh soaps- only use summers eve Avoid scratching Monistat OTC for itching - fluconazole (DIFLUCAN) 150 MG tablet; 1 po q week x 4 weeks  Dispense: 4 tablet; Refill: 0   Follow Up Instructions: prn    I discussed the assessment and treatment plan with the patient. The patient was provided an opportunity to ask questions and all were answered. The patient agreed with the plan and demonstrated an understanding of the instructions.   The patient was advised to call back or seek an in-person evaluation if the symptoms worsen or if the condition fails to improve as anticipated.  The above assessment and management plan was discussed with the patient. The patient verbalized understanding of and has agreed to the management plan. Patient is aware to call the clinic if symptoms persist or worsen. Patient is aware when to return to the clinic for a follow-up visit. Patient educated on when it is appropriate to go to the emergency department.   Time call ended:  5:42 I provided 12 minutes of non-face-to-face time during this encounter.    Mary-Margaret Hassell Done, FNP

## 2019-11-17 ENCOUNTER — Telehealth: Payer: Self-pay | Admitting: Family Medicine

## 2019-11-17 NOTE — Chronic Care Management (AMB) (Signed)
Chronic Care Management   Note  11/17/2019 Name: KATHERINA WIMER MRN: [id number] DOB: 01-22-1951  LADEANA LAPLANT is a 68 y.o. year old female who is a primary care patient of Janora Norlander, DO. I reached out to [employer] by phone today in response to a referral sent by Ms. Mistie Adney Pedretti's health plan.     Ms. Cease was given information about Chronic Care Management services today including:  1. CCM service includes personalized support from designated clinical staff supervised by her physician, including individualized plan of care and coordination with other care providers 2. 24/7 contact phone numbers for assistance for urgent and routine care needs. 3. Service will only be billed when office clinical staff spend 20 minutes or more in a month to coordinate care. 4. Only one practitioner may furnish and bill the service in a calendar month. 5. The patient may stop CCM services at any time (effective at the end of the month) by phone call to the office staff. 6. The patient will be responsible for cost sharing (co-pay) of up to 20% of the service fee (after annual deductible is met).  Patient agreed to services and verbal consent obtained.   Follow up plan: Telephone appointment with CCM team member scheduled for: 12/21/2019  [facility]  [place], [place] [address] Direct Dial: [email or web address]  Website: [email or web address]

## 2019-11-19 ENCOUNTER — Ambulatory Visit (INDEPENDENT_AMBULATORY_CARE_PROVIDER_SITE_OTHER): Payer: PPO | Admitting: *Deleted

## 2019-11-19 DIAGNOSIS — Z950 Presence of cardiac pacemaker: Secondary | ICD-10-CM | POA: Diagnosis not present

## 2019-11-19 LAB — CUP PACEART REMOTE DEVICE CHECK
Battery Impedance: 299 Ohm
Battery Remaining Longevity: 121 mo
Battery Voltage: 2.78 V
Brady Statistic AP VP Percent: 0 %
Brady Statistic AP VS Percent: 0 %
Brady Statistic AS VP Percent: 0 %
Brady Statistic AS VS Percent: 100 %
Date Time Interrogation Session: 20201203102222
Implantable Lead Implant Date: 20050914
Implantable Lead Implant Date: 20050914
Implantable Lead Location: 753859
Implantable Lead Location: 753860
Implantable Lead Model: 5076
Implantable Lead Model: 5076
Implantable Pulse Generator Implant Date: 20150605
Lead Channel Impedance Value: 525 Ohm
Lead Channel Impedance Value: 560 Ohm
Lead Channel Pacing Threshold Amplitude: 0.375 V
Lead Channel Pacing Threshold Amplitude: 0.75 V
Lead Channel Pacing Threshold Pulse Width: 0.4 ms
Lead Channel Pacing Threshold Pulse Width: 0.4 ms
Lead Channel Setting Pacing Amplitude: 2 V
Lead Channel Setting Pacing Amplitude: 2.5 V
Lead Channel Setting Pacing Pulse Width: 0.4 ms
Lead Channel Setting Sensing Sensitivity: 5.6 mV

## 2019-11-25 DIAGNOSIS — N898 Other specified noninflammatory disorders of vagina: Secondary | ICD-10-CM | POA: Diagnosis not present

## 2019-11-25 DIAGNOSIS — F419 Anxiety disorder, unspecified: Secondary | ICD-10-CM | POA: Insufficient documentation

## 2019-11-25 DIAGNOSIS — K635 Polyp of colon: Secondary | ICD-10-CM | POA: Insufficient documentation

## 2019-11-25 DIAGNOSIS — K219 Gastro-esophageal reflux disease without esophagitis: Secondary | ICD-10-CM | POA: Insufficient documentation

## 2019-11-27 ENCOUNTER — Ambulatory Visit: Payer: PPO | Admitting: Family Medicine

## 2019-12-03 ENCOUNTER — Other Ambulatory Visit: Payer: Self-pay

## 2019-12-03 ENCOUNTER — Ambulatory Visit (INDEPENDENT_AMBULATORY_CARE_PROVIDER_SITE_OTHER): Payer: PPO | Admitting: Adult Health

## 2019-12-03 ENCOUNTER — Encounter: Payer: Self-pay | Admitting: Adult Health

## 2019-12-03 VITALS — BP 123/76 | HR 69 | Temp 97.9°F | Ht 64.0 in | Wt 161.0 lb

## 2019-12-03 DIAGNOSIS — I639 Cerebral infarction, unspecified: Secondary | ICD-10-CM | POA: Diagnosis not present

## 2019-12-03 DIAGNOSIS — E119 Type 2 diabetes mellitus without complications: Secondary | ICD-10-CM

## 2019-12-03 DIAGNOSIS — I152 Hypertension secondary to endocrine disorders: Secondary | ICD-10-CM | POA: Diagnosis not present

## 2019-12-03 DIAGNOSIS — E785 Hyperlipidemia, unspecified: Secondary | ICD-10-CM | POA: Diagnosis not present

## 2019-12-03 NOTE — Patient Instructions (Signed)
Continue aspirin 81 mg daily  and Crestor for secondary stroke prevention  Continue to follow up with PCP regarding cholesterol, blood pressure and diabetes management   Continue to monitor blood pressure at home  Maintain strict control of hypertension with blood pressure goal below 130/90, diabetes with hemoglobin A1c goal below 6.5% and cholesterol with LDL cholesterol (bad cholesterol) goal below 70 mg/dL. I also advised the patient to eat a healthy diet with plenty of whole grains, cereals, fruits and vegetables, exercise regularly and maintain ideal body weight.  As you have been doing well from a stroke standpoint, recommend follow-up as needed       Thank you for coming to see [place] at [facility]. I hope we have been able to provide you high quality care today.  You may receive a patient satisfaction survey over the next few weeks. We would appreciate your feedback and comments so that we may continue to improve ourselves and the health of our patients.

## 2019-12-03 NOTE — Progress Notes (Signed)
I agree with the above plan 

## 2019-12-03 NOTE — Progress Notes (Signed)
[facility] [address] [place]. [place] [address] [phone]       OFFICE FOLLOW UP NOTE  Ms. Deer Lodge Date of Birth:  [birth year] Medical Record Number:  [id number]   Reason for Referral: stroke follow up    CHIEF COMPLAINT:  Chief Complaint  Patient presents with  . Follow-up    4 mon f/u. Alone. Treatmen room. No new concerns at this time.     HPI: Stroke admission 02/13/2019: Roschelle Calandra Sappis an 68 y.o.femalePMH of pacemaker ( for bradycardia), DM2, HTN, HLD, uterine cancer who presented to [facility] ED on 02/13/2019 for c/o visual field blurring in the right eye, and light headed feeling as well as pain in the back of her head since 02/09/2019.  Per report, intended to treat headache with Advil, Celebrex and baby aspirin but due to ongoing symptoms, she followed up with ophthalmology and PCP on 02/13/2019 who referred her to ED for further evaluation.  Patient transferred to [facility] for Neurology d/t stroke.  Stroke work-up revealed acute/subacute left occipital stroke as evidenced on CT head (unable to perform MRI 2/2 pacemaker) with residual right homonymous inferior quadrantanopia.  Stroke likely related to PCA L>R stenosis as evidenced on CTA head and neck vs small vessel disease etiology.  She did not receive tPA due to late presentation and minimal deficits.  2D echo unremarkable.  Recommended DAPT for 3 weeks and aspirin alone.  HTN stable.  Initiated Crestor 5 mg daily for HLD management with LDL 107.  Recommended follow-up with PCP for uncontrolled DM with A1c 7.3.  Requested pacemaker interrogation to assess for potential atrial fibrillation.  Other stroke risk factors include advanced age and obesity but no prior history of stroke.  Incidental finding of 2.8 cm heterogeneous right thyroid nodule and recommended follow-up with PCP outpatient.  Discharged home in stable condition without therapy needs.  Initial visit 07/27/2019: She declines residual  deficits -feels she has returned completely back to her baseline and denies any residual visual deficits She has returned back to all prior activities including driving without difficulty She does endorse mild memory loss which has been present prior to her stroke which consists of forgetting people's names such as TV or movie stars or she will drive to town and when she arrives, she will forget what she needs to do. She does endorse daily fatigue and will nap daily if able depending on her schedule.  She denies insomnia or difficulty sleeping at night.  She does endorse snoring and has been told she will have flailing of extremities or will scream out her cry while she is still sleeping.  She also endorses vivid dreams typically consisting of nightmares.  She has not previously underwent sleep study Completed 3 weeks DAPT and continues on aspirin alone without bleeding or bruising Continues on Crestor 5 mg but was changed from daily to every other day approximately 3 months ago as recommended by PCP due to initially experiencing possible myalgias -she does endorse improvement since taking every other day.  She has not had repeat lipid panel since initiation of Crestor Glucose levels stable recent A1c 6.8 Blood pressure satisfactory at 121/72 Pacemaker interrogated which was negative for atrial fibrillation Denies new or worsening stroke/TIA symptoms  Update 12/03/2019: Ms. Favorite is a 68 year old female who is being seen today for stroke follow-up.  She has been doing well from a stroke standpoint without residual deficits or new/reoccurring stroke/TIA symptoms.  She continues to follow with ophthalmology regularly.  She continues on aspirin and Crestor for secondary stroke prevention without side effects.  Lab work obtained on 09/07/2019 which showed LDL 67 and A1c 6.6.  Blood pressure today satisfactory at 123/76.  Referred to [place] sleep clinic and was seen by Dr. Rexene Alberts on 08/12/2019 who recommended  pursuing sleep study but patient wished to hold off on testing at that time.  She was advised to call office when she wish to pursue.  She continues to decline interested in pursuing evaluation.  No further concerns at this time.      ROS:   14 system review of systems performed and negative with exception of no complaints  PMH:  Past Medical History:  Diagnosis Date  . Anxiety   . Arthritis    of the neck  . Colon polyps   . Esophageal stricture   . GERD (gastroesophageal reflux disease)    Hiatal Hernia  . History of uterine cancer   . Hyperlipidemia   . Hypertension   . Osteoporosis   . Pacemaker   . Symptomatic bradycardia     PSH:  Past Surgical History:  Procedure Laterality Date  . APPENDECTOMY    . CHOLECYSTECTOMY    . LAPAROSCOPIC TOTAL HYSTERECTOMY    . LYMPH NODE DISSECTION     bilateral pelvic and right-sided periaortic  . PACEMAKER GENERATOR CHANGE N/A 05/21/2014   Procedure: PACEMAKER GENERATOR CHANGE;  Surgeon: Evans Lance, MD;  Location: [facility] CATH LAB;  Service: Cardiovascular;  Laterality: N/A;  . status post pacemaker     metronic [id number]  . TRANSTHORACIC ECHOCARDIOGRAM  08/30/04  . TUBAL LIGATION      Social History:  Social History   Socioeconomic History  . Marital status: Divorced    Spouse name: Not on file  . Number of children: 1  . Years of education: GED  . Highest education level: GED or equivalent  Occupational History  . Occupation: retired    Comment: Engineer, manufacturing systems  Tobacco Use  . Smoking status: Never Smoker  . Smokeless tobacco: Never Used  Substance and Sexual Activity  . Alcohol use: No    Comment: may have a social drink rarely  . Drug use: No  . Sexual activity: Not Currently  Other Topics Concern  . Not on file  Social History Narrative  . Not on file   Social Determinants of Health   Financial Resource Strain:   . Difficulty of Paying Living Expenses: Not on file  Food Insecurity:   . Worried About  Charity fundraiser in the Last Year: Not on file  . Ran Out of Food in the Last Year: Not on file  Transportation Needs:   . Lack of Transportation (Medical): Not on file  . Lack of Transportation (Non-Medical): Not on file  Physical Activity:   . Days of Exercise per Week: Not on file  . Minutes of Exercise per Session: Not on file  Stress:   . Feeling of Stress : Not on file  Social Connections:   . Frequency of Communication with Friends and Family: Not on file  . Frequency of Social Gatherings with Friends and Family: Not on file  . Attends Religious Services: Not on file  . Active Member of Clubs or Organizations: Not on file  . Attends Archivist Meetings: Not on file  . Marital Status: Not on file  Intimate Partner Violence:   . Fear of Current or Ex-Partner: Not on file  . Emotionally Abused: Not  on file  . Physically Abused: Not on file  . Sexually Abused: Not on file    Family History:  Family History  Problem Relation Age of Onset  . Heart disease Mother   . Heart attack Mother   . Heart disease Father   . Heart attack Father   . Cancer Sister        breast  . Alcohol abuse Brother   . COPD Sister   . Healthy Daughter     Medications:   Current Outpatient Medications on File Prior to Visit  Medication Sig Dispense Refill  . aspirin EC 81 MG EC tablet Take 1 tablet (81 mg total) by mouth daily. 30 tablet 0  . Blood Glucose Monitoring Suppl (ONE TOUCH ULTRA 2) w/Device KIT 1 Device by Does not apply route daily. 1 each 0  . BYSTOLIC 5 MG tablet TAKE  (1)  TABLET TWICE A DAY. 60 tablet 11  . calcium citrate-vitamin D (CITRACAL+D) 315-200 MG-UNIT per tablet Take 1 tablet by mouth daily.     Marland Kitchen glucose blood test strip Use to check BG up to qd 100 each 2  . metFORMIN (GLUCOPHAGE) 500 MG tablet Take 1 tablet (500 mg total) by mouth 2 (two) times daily with a meal. 180 tablet 3  . ONETOUCH DELICA LANCETS FINE MISC Use to check BG up to qd 100 each 2  .  pantoprazole (PROTONIX) 40 MG tablet Take 1 tablet (40 mg total) by mouth daily. 90 tablet 3  . rosuvastatin (CRESTOR) 5 MG tablet Take 1 tablet (5 mg total) by mouth daily at 6 PM. 30 tablet 4  . sertraline (ZOLOFT) 50 MG tablet Take 1 tablet (50 mg total) by mouth daily. 90 tablet 3  . [DISCONTINUED] metoprolol succinate (TOPROL-XL) 25 MG 24 hr tablet Take 1 tablet (25 mg total) by mouth daily. 90 tablet 3   No current facility-administered medications on file prior to visit.    Allergies:   Allergies  Allergen Reactions  . Pravachol [Pravastatin Sodium] Other (See Comments)    Stomach pain and constipation      Physical Exam  Vitals:   12/03/19 1237  BP: 123/76  Pulse: 69  Temp: 97.9 F (36.6 C)  TempSrc: Oral  Weight: 161 lb (73 kg)  Height: '5\' 4"'$  (1.626 m)   Body mass index is 27.64 kg/m. No exam data present   General: well developed, well nourished,  pleasant middle-age Caucasian female, seated, in no evident distress Head: head normocephalic and atraumatic.   Neck: supple with no carotid or supraclavicular bruits Cardiovascular: regular rate and rhythm, no murmurs Musculoskeletal: no deformity Skin:  no rash/petichiae Vascular:  Normal pulses all extremities   Neurologic Exam Mental Status: Awake and fully alert. Oriented to place and time. Recent and remote memory intact. Attention span, concentration and fund of knowledge appropriate. Mood and affect appropriate.  Cranial Nerves: Fundoscopic exam reveals sharp disc margins. Pupils equal, briskly reactive to light. Extraocular movements full without nystagmus. Visual fields full to confrontation.  Unable to appreciate any type of visual loss or difficulty.  Hearing intact. Facial sensation intact. Face, tongue, palate moves normally and symmetrically.  Motor: Normal bulk and tone. Normal strength in all tested extremity muscles. Sensory.: intact to touch , pinprick , position and vibratory sensation.    Coordination: Rapid alternating movements normal in all extremities. Finger-to-nose and heel-to-shin performed accurately bilaterally. Gait and Station: Arises from chair without difficulty. Stance is normal. Gait demonstrates normal stride length  and balance Reflexes: 1+ and symmetric. Toes downgoing.      Diagnostic Data (Labs, Imaging, Testing)  Ct Angio Head W Or Wo Contrast Ct Angio Neck W And/or Wo Contrast 02/13/2019 IMPRESSION:  1. Negative CTA for large vessel occlusion. Wide patency of the major arterial vasculature of the head and neck.  2. Moderate atheromatous irregularity involving the PCAs bilaterally, left greater than right.  3. Additional minor for age atherosclerotic change elsewhere as above. No other hemodynamically significant or correctable stenosis.  4. 2.8 cm heterogeneous right thyroid nodule. Follow-up examination with nonemergent outpatient thyroid ultrasound recommended for further characterization.    Ct Head Wo Contrast 02/14/2019 IMPRESSION:  Stable distribution of late acute to subacute infarction within the left occipital lobe. No new acute intracranial abnormality identified.    Ct Head Wo Contrast 02/13/2019 IMPRESSION:  New focal hypodensity in the left occipital lobe. This most likely represents a completed infarction in the left PCA distribution, however, neoplasm can not be excluded. Contrast-enhanced MRI is recommended, as well as neurology consultation. (see below)   Ct Head W Contrast 02/13/2019 IMPRESSION:  After contrast administration, the left occipital low-density shows findings consistent with an acute/subacute occipital stroke. There is no finding to suggest the presence of an underlying mass lesion.   ECHOCARDIOGRAM 02/14/2019 IMPRESSIONS  1. The left ventricle has normal systolic function with an ejection fraction of 60-65%. The cavity size was normal. Left ventricular diastology could not be evaluated due to nondiagnostic  images.  2. The right ventricle has normal systolic function. The cavity was normal. There is no increase in right ventricular wall thickness.  3. Left atrial size was mildly dilated.  4. The mitral valve is normal in structure. There is mild to moderate mitral annular calcification present.  5. The tricuspid valve is normal in structure.  6. The aortic valve is tricuspid Mild thickening of the aortic valve Moderate calcification of the aortic valve.  7. The pulmonic valve was normal in structure.    ASSESSMENT: ALEXYIA GUARINO is a 68 y.o. year old female presented with visual loss on 02/13/2019 with a stroke work-up revealing left occipital infarct secondary to left PCA stenosis versus small vessel disease. Vascular risk factors include HTN, HLD, and DM.  Continues to do well from a stroke standpoint without residual deficits or new/reoccurring stroke/TIA symptoms.    PLAN:  1. Left occipital infarct: Continue aspirin 81 mg daily  and Crestor 5 mg every other day for secondary stroke prevention. Maintain strict control of hypertension with blood pressure goal below 130/90, diabetes with hemoglobin A1c goal below 6.5% and cholesterol with LDL cholesterol (bad cholesterol) goal below 70 mg/dL.  I also advised the patient to eat a healthy diet with plenty of whole grains, cereals, fruits and vegetables, exercise regularly with at least 30 minutes of continuous activity daily and maintain ideal body weight. 2. HTN: Advised to continue current treatment regimen.  Today's BP satisfactory.  Advised to continue to monitor at home along with continued follow-up with PCP for management 3. HLD: Advised continuation of current statin at this time but will check lipid panel today to ensure adequate management with potential need of changing therapy if LDL greater than 70 4. DMII: Advised to continue to monitor glucose levels at home along with continued follow-up with PCP for management and  monitoring 5. Suspect underlying sleep disorder: Evaluated by Dr. Rexene Alberts in 07/2019 but continues to decline interest in pursuing sleep evaluation at this time.  I again reviewed  increased risk of untreated sleep apnea and verbalized understanding.  She is aware to call office in the future if interested in pursuing   Overall stable from stroke standpoint and recommend follow-up as needed   Greater than 50% of time during this 25 minute visit was spent on counseling, explanation of diagnosis of left occipital infarct, reviewing risk factor management of HTN, HLD and DM, planning of further management along with potential future management, and discussion with patient answering all questions to satisfaction    Frann Rider, AGNP-BC  [facility] [address] [place], [place] [address]  Phone [phone] Fax [phone] Note: This document was prepared with digital dictation and possible smart phrase technology. Any transcriptional errors that result from this process are unintentional.

## 2019-12-15 NOTE — Progress Notes (Signed)
PPM remote 

## 2019-12-21 ENCOUNTER — Ambulatory Visit (INDEPENDENT_AMBULATORY_CARE_PROVIDER_SITE_OTHER): Payer: PPO | Admitting: Nurse Practitioner

## 2019-12-21 ENCOUNTER — Other Ambulatory Visit: Payer: Self-pay | Admitting: Family Medicine

## 2019-12-21 ENCOUNTER — Ambulatory Visit (INDEPENDENT_AMBULATORY_CARE_PROVIDER_SITE_OTHER): Payer: PPO | Admitting: *Deleted

## 2019-12-21 ENCOUNTER — Encounter: Payer: Self-pay | Admitting: Nurse Practitioner

## 2019-12-21 DIAGNOSIS — E119 Type 2 diabetes mellitus without complications: Secondary | ICD-10-CM

## 2019-12-21 DIAGNOSIS — E1169 Type 2 diabetes mellitus with other specified complication: Secondary | ICD-10-CM

## 2019-12-21 DIAGNOSIS — J069 Acute upper respiratory infection, unspecified: Secondary | ICD-10-CM

## 2019-12-21 DIAGNOSIS — E1159 Type 2 diabetes mellitus with other circulatory complications: Secondary | ICD-10-CM

## 2019-12-21 NOTE — Progress Notes (Signed)
Virtual Visit via telephone Note Due to COVID-19 pandemic this visit was conducted virtually. This visit type was conducted due to national recommendations for restrictions regarding the COVID-19 Pandemic (e.g. social distancing, sheltering in place) in an effort to limit this patient's exposure and mitigate transmission in our community. All issues noted in this document were discussed and addressed.  A physical exam was not performed with this format.  I connected with [name] on 12/21/19 at 10:20 by telephone and verified that I am speaking with the correct person using two identifiers. [name] is currently located at home and no one is currently with her during visit. The provider, Mary-Margaret Hassell Done, FNP is located in their office at time of visit.  I discussed the limitations, risks, security and privacy concerns of performing an evaluation and management service by telephone and the availability of in person appointments. I also discussed with the patient that there may be a patient responsible charge related to this service. The patient expressed understanding and agreed to proceed.   History and Present Illness:   Chief Complaint: Cough   HPI Patient calls in today c/o cough and congestion since last Sunday. She has had fever ( 99.1) and body aches. Started 1 week ago. She has been using OTC treatment and is slightly better.   Review of Systems  Constitutional: Positive for chills and fever (low grade).  HENT: Positive for congestion and sore throat (slight).   Respiratory: Positive for cough (occasional).   Neurological: Positive for headaches.  All other systems reviewed and are negative.    Observations/Objective Alert and oriented- answers all questions appropriately No distress No cough or congestion noted   :Assessment and Plan:   [name] in today with chief complaint of Cough   1. URI with cough and congestion 1. Take meds as  prescribed 2. Use a cool mist humidifier especially during the winter months and when heat has been humid. 3. Use saline nose sprays frequently 4. Saline irrigations of the nose can be very helpful if done frequently.  * 4X daily for 1 week*  * Use of a nettie pot can be helpful with this. Follow directions with this* 5. Drink plenty of fluids 6. Keep thermostat turn down low 7.For any cough or congestion  Use plain Mucinex- regular strength or max strength is fine   * Children- consult with Pharmacist for dosing 8. For fever or aces or pains- take tylenol or ibuprofen appropriate for age and weight.  * for fevers greater than 101 orally you may alternate ibuprofen and tylenol every  3 hours.   Continue current at home treatment- if symptoms change or worsen let us know * discussed possibility of having covid- but since she is getting better, I do not feel that testing is helpful at this point. Patient agreed Quarantine until no symptms for greater then 48 hours.    Follow Up Instructions: prn    I discussed the assessment and treatment plan with the patient. The patient was provided an opportunity to ask questions and all were answered. The patient agreed with the plan and demonstrated an understanding of the instructions.   The patient was advised to call back or seek an in-person evaluation if the symptoms worsen or if the condition fails to improve as anticipated.  The above assessment and management plan was discussed with the patient. The patient verbalized understanding of and has agreed to the management plan. Patient is aware to call  the clinic if symptoms persist or worsen. Patient is aware when to return to the clinic for a follow-up visit. Patient educated on when it is appropriate to go to the emergency department.   Time call ended:  10:34  I provided 14 minutes of non-face-to-face time during this encounter.    Mary-Margaret Hassell Done, FNP

## 2019-12-21 NOTE — Chronic Care Management (AMB) (Signed)
Chronic Care Management   Initial Visit Note  12/21/2019 Name: [name] MRN: [id number] DOB: 05/08/1951  Referred by: Janora Norlander, DO Reason for referral : Chronic Care Management (RN Initial Outreach)   [name] is a 69 y.o. year old female who is a primary care patient of Janora Norlander, DO. The CCM team was consulted for assistance with chronic disease management and care coordination needs related to HTN, DM, GERD, hyperlipidemia, hx of occipital stroke, arthritis, anxiety.  Review of patient status, including review of consultants reports, relevant laboratory and other test results, and collaboration with appropriate care team members and the patient's provider was performed as part of comprehensive patient evaluation and provision of chronic care management services.    SDOH (Social Determinants of Health) screening performed today: Physical Activity. See Care Plan for related entries.   Subjective: I spoke with [name] by telephone today regarding her chronic medical conditions and her acute URI illness which her PCP's office advised her to treat like Covid and to quarantine. I verified that she does not have any problems with access to food, medications, transportation, or adequate housing.   Objective: Outpatient Encounter Medications as of 12/21/2019  Medication Sig  . aspirin EC 81 MG EC tablet Take 1 tablet (81 mg total) by mouth daily.  . Blood Glucose Monitoring Suppl (ONE TOUCH ULTRA 2) w/Device KIT 1 Device by Does not apply route daily.  Marland Kitchen BYSTOLIC 5 MG tablet TAKE  (1)  TABLET TWICE A DAY.  . calcium citrate-vitamin D (CITRACAL+D) 315-200 MG-UNIT per tablet Take 1 tablet by mouth daily.   Marland Kitchen glucose blood test strip Use to check BG up to qd  . metFORMIN (GLUCOPHAGE) 500 MG tablet Take 1 tablet (500 mg total) by mouth 2 (two) times daily with a meal.  . ONETOUCH DELICA LANCETS FINE MISC Use to check BG up to qd  . pantoprazole (PROTONIX) 40 MG tablet  Take 1 tablet (40 mg total) by mouth daily.  . rosuvastatin (CRESTOR) 5 MG tablet Take 1 tablet (5 mg total) by mouth daily at 6 PM.  . sertraline (ZOLOFT) 50 MG tablet Take 1 tablet (50 mg total) by mouth daily.  . [DISCONTINUED] metoprolol succinate (TOPROL-XL) 25 MG 24 hr tablet Take 1 tablet (25 mg total) by mouth daily.   No facility-administered encounter medications on file as of 12/21/2019.     Lab Results  Component Value Date   HGBA1C 6.6 09/07/2019   HGBA1C 6.8 05/27/2019   HGBA1C 7.3 (H) 02/14/2019   Lab Results  Component Value Date   LDLCALC 81 07/27/2019   CREATININE 0.82 02/24/2019   BP Readings from Last 3 Encounters:  12/03/19 123/76  09/07/19 136/82  08/12/19 (!) 143/80   Lab Results  Component Value Date   CHOL 177 07/27/2019   HDL 47 07/27/2019   LDLCALC 81 07/27/2019   LDLDIRECT 67 09/07/2019   TRIG 246 (H) 07/27/2019   CHOLHDL 3.8 07/27/2019   BMI Readings from Last 3 Encounters:  12/03/19 27.64 kg/m  09/07/19 28.34 kg/m  08/12/19 27.99 kg/m     RN Assessment & Care Plan   . Chronic Disease Management Needs       Current Barriers:  . Chronic Disease Management support, education, and care coordination needs related to HTN, DM, GERD, hyperlipidemia, hx of occipital stroke, arthritis, anxiety  Clinical Goal(s) related to HTN, DM, GERD, hyperlipidemia, hx of occipital stroke, arthritis, anxiety:  Over the next 60 days, patient  will:  . Work with the care management team to address educational, disease management, and care coordination needs  . Begin or continue self health monitoring activities as directed today Measure and record cbg (blood glucose) one times daily . Call provider office for new or worsened signs and symptoms . Call care management team with questions or concerns . Verbalize basic understanding of patient centered plan of care established today  Interventions related to HTN, DM, GERD, hyperlipidemia, hx of occipital stroke,  arthritis, anxiety:  . Evaluation of current treatment plans and patient's adherence to plan as established by provider . Assessed patient understanding of disease states . Assessed patient's education and care coordination needs . Provided disease specific education to patient  . Collaborated with appropriate clinical care team members regarding patient needs  Patient Self Care Activities related to HTN, DM, GERD, hyperlipidemia, hx of occipital stroke, arthritis, anxiety:  . Patient is unable to independently self-manage chronic health conditions  Initial goal documentation     . Exercise 150 min/wk Moderate Activity       Current Barriers:  Marland Kitchen Knowledge Deficits related to physical activity reccomendations   Nurse Case Manager Clinical Goal(s):  Marland Kitchen Over the next 30 days, patient will increase physical activity level as tolerated with a goal of 150 minutes of moderate physical activity a week  Interventions:  . Discussed current activities . Encouraged increasing physical activity level in addition to her routine movements and chores throughout the day . Verbal education provided . Written educational materials prepared and will be mailed  Patient Self Care Activities:  . Performs ADL's independently . Performs IADL's independently  Initial goal documentation     . Improvement in Covid Like Symptoms       Covid-like symptoms in a patient with diabetes and hypertension.    Current Barriers:  Marland Kitchen Knowledge Deficits related to Covid-like symptoms  Nurse Case Manager Clinical Goal(s):  Marland Kitchen Over the next 14 days, patient will continue to see improvement in Covid-like symptoms  Interventions:  . Evaluation of current treatment plan related to Covid-like illness and patient's adherence to plan as established by provider. . Advised patient to call PCP if symptoms worsen or do not improve and seek emergency medical assistance if needed.  . Discussed plans with patient for ongoing care  management follow up and provided patient with direct contact information for care management team  Patient Self Care Activities:  . Performs ADL's independently . Performs IADL's independently  Initial goal documentation        Follow-up Plan:  The care management team will reach out to the patient again over the next 30 days.   [name], BSN, RN-BC Embedded Chronic Care Manager [facility] Direct Dial: [phone]

## 2019-12-21 NOTE — Patient Instructions (Signed)
Visit Information  Goals Addressed            This Visit's Progress   . Chronic Disease Management Needs       Current Barriers:  . Chronic Disease Management support, education, and care coordination needs related to HTN, DM, GERD, hyperlipidemia, hx of occipital stroke, arthritis, anxiety  Clinical Goal(s) related to HTN, DM, GERD, hyperlipidemia, hx of occipital stroke, arthritis, anxiety:  Over the next 60 days, patient will:  . Work with the care management team to address educational, disease management, and care coordination needs  . Begin or continue self health monitoring activities as directed today Measure and record cbg (blood glucose) one times daily . Call provider office for new or worsened signs and symptoms . Call care management team with questions or concerns . Verbalize basic understanding of patient centered plan of care established today  Interventions related to HTN, DM, GERD, hyperlipidemia, hx of occipital stroke, arthritis, anxiety:  . Evaluation of current treatment plans and patient's adherence to plan as established by provider . Assessed patient understanding of disease states . Assessed patient's education and care coordination needs . Provided disease specific education to patient  . Collaborated with appropriate clinical care team members regarding patient needs  Patient Self Care Activities related to HTN, DM, GERD, hyperlipidemia, hx of occipital stroke, arthritis, anxiety:  . Patient is unable to independently self-manage chronic health conditions  Initial goal documentation     . Decrease Blood Sugar       Current Barriers:  . Chronic Disease Management support and education needs related to diabetes.  Nurse Case Manager Clinical Goal(s):  Marland Kitchen Over the next 60 days, patient will work with Consulting civil engineer to address needs related to diabetes management.   Interventions:  . Evaluation of current treatment plan related to diabetes and patient's  adherence to plan as established by provider. . Advised patient to check blood sugar daily . Reviewed medications with patient and discussed metformin and prior medications used for diabetes management . Discussed plans with patient for ongoing care management follow up and provided patient with direct contact information for care management team . Discussed current blood sugar levels o Checks it every few days. Averages about 180 fasting . RN will collaborate with PCP regarding current medications and if changes are needed  Patient Self Care Activities:  . Performs ADL's independently . Performs IADL's independently   Initial goal documentation     . Exercise 150 min/wk Moderate Activity       Current Barriers:  Marland Kitchen Knowledge Deficits related to physical activity reccomendations   Nurse Case Manager Clinical Goal(s):  Marland Kitchen Over the next 30 days, patient will increase physical activity level as tolerated with a goal of 150 minutes of moderate physical activity a week  Interventions:  . Discussed current activities . Encouraged increasing physical activity level in addition to her routine movements and chores throughout the day . Verbal education provided . Written educational materials prepared and will be mailed  Patient Self Care Activities:  . Performs ADL's independently . Performs IADL's independently  Initial goal documentation     . Improvement in Covid Like Symptoms       Covid-like symptoms in a patient with diabetes and hypertension.    Current Barriers:  Marland Kitchen Knowledge Deficits related to Covid-like symptoms  Nurse Case Manager Clinical Goal(s):  Marland Kitchen Over the next 14 days, patient will continue to see improvement in Covid-like symptoms  Interventions:  . Evaluation of  current treatment plan related to Covid-like illness and patient's adherence to plan as established by provider. . Advised patient to call PCP if symptoms worsen or do not improve and seek emergency  medical assistance if needed.  . Discussed plans with patient for ongoing care management follow up and provided patient with direct contact information for care management team  Patient Self Care Activities:  . Performs ADL's independently . Performs IADL's independently  Initial goal documentation      The care management team will reach out to the patient again over the next 30 days.    Ms. Montesano was given information about Chronic Care Management services today including:  1. CCM service includes personalized support from designated clinical staff supervised by her physician, including individualized plan of care and coordination with other care providers 2. 24/7 contact phone numbers for assistance for urgent and routine care needs. 3. Service will only be billed when office clinical staff spend 20 minutes or more in a month to coordinate care. 4. Only one practitioner may furnish and bill the service in a calendar month. 5. The patient may stop CCM services at any time (effective at the end of the month) by phone call to the office staff. 6. The patient will be responsible for cost sharing (co-pay) of up to 20% of the service fee (after annual deductible is met).  Patient agreed to services and verbal consent obtained.   [name], BSN, RN-BC Embedded Chronic Care Manager [facility] Direct Dial: [phone]   The patient verbalized understanding of instructions provided today and declined a print copy of patient instruction materials.

## 2019-12-23 DIAGNOSIS — J3489 Other specified disorders of nose and nasal sinuses: Secondary | ICD-10-CM | POA: Diagnosis not present

## 2019-12-23 DIAGNOSIS — R05 Cough: Secondary | ICD-10-CM | POA: Diagnosis not present

## 2020-01-05 ENCOUNTER — Encounter: Payer: Self-pay | Admitting: Internal Medicine

## 2020-01-05 ENCOUNTER — Other Ambulatory Visit: Payer: Self-pay

## 2020-01-05 ENCOUNTER — Ambulatory Visit (INDEPENDENT_AMBULATORY_CARE_PROVIDER_SITE_OTHER): Payer: PPO | Admitting: Internal Medicine

## 2020-01-05 VITALS — BP 150/80 | HR 70 | Ht 64.0 in | Wt 161.0 lb

## 2020-01-05 DIAGNOSIS — I10 Essential (primary) hypertension: Secondary | ICD-10-CM

## 2020-01-05 DIAGNOSIS — R001 Bradycardia, unspecified: Secondary | ICD-10-CM | POA: Diagnosis not present

## 2020-01-05 DIAGNOSIS — Z950 Presence of cardiac pacemaker: Secondary | ICD-10-CM | POA: Diagnosis not present

## 2020-01-05 HISTORY — DX: Essential (primary) hypertension: I10

## 2020-01-05 NOTE — Patient Instructions (Signed)
Medication Instructions:  Your physician recommends that you continue on your current medications as directed. Please refer to the Current Medication list given to you today.  Labwork: None ordered.  Testing/Procedures: None ordered.  Follow-Up: Your physician wants you to follow-up in: one year with Dr. Lovena Le.   You will receive a reminder letter in the mail two months in advance. If you don't receive a letter, please call our office to schedule the follow-up appointment.  Remote monitoring is used to monitor your Pacemaker from home. This monitoring reduces the number of office visits required to check your device to one time per year. It allows [place] to keep an eye on the functioning of your device to ensure it is working properly. You are scheduled for a device check from home on 02/18/2020. You may send your transmission at any time that day. If you have a wireless device, the transmission will be sent automatically. After your physician reviews your transmission, you will receive a postcard with your next transmission date.  Any Other Special Instructions Will Be Listed Below (If Applicable).  If you need a refill on your cardiac medications before your next appointment, please call your pharmacy.

## 2020-01-05 NOTE — Progress Notes (Signed)
HPI [name] returns today for followup. She is a pleasant 69 yo woman with HTN, sinus node dysfunction s/p PPM insertion who has had a stroke about 2 months ago. She has recovered and was noted not to have atrial fib on her device interrogation. She denies chest pain or sob. No edema.  Allergies  Allergen Reactions  . Pravachol [Pravastatin Sodium] Other (See Comments)    Stomach pain and constipation      Current Outpatient Medications  Medication Sig Dispense Refill  . aspirin EC 81 MG EC tablet Take 1 tablet (81 mg total) by mouth daily. 30 tablet 0  . Blood Glucose Monitoring Suppl (ONE TOUCH ULTRA 2) w/Device KIT 1 Device by Does not apply route daily. 1 each 0  . BYSTOLIC 5 MG tablet TAKE  (1)  TABLET TWICE A DAY. 60 tablet 11  . calcium citrate-vitamin D (CITRACAL+D) 315-200 MG-UNIT per tablet Take 1 tablet by mouth daily.     Marland Kitchen glucose blood test strip Use to check BG up to qd 100 each 2  . metFORMIN (GLUCOPHAGE) 500 MG tablet Take 1 tablet (500 mg total) by mouth 2 (two) times daily with a meal. 180 tablet 3  . ONETOUCH DELICA LANCETS FINE MISC Use to check BG up to qd 100 each 2  . pantoprazole (PROTONIX) 40 MG tablet Take 1 tablet (40 mg total) by mouth daily. 90 tablet 3  . rosuvastatin (CRESTOR) 5 MG tablet Take 1 tablet (5 mg total) by mouth daily at 6 PM. 30 tablet 0  . sertraline (ZOLOFT) 50 MG tablet Take 1 tablet (50 mg total) by mouth daily. 90 tablet 3   No current facility-administered medications for this visit.     Past Medical History:  Diagnosis Date  . Anxiety   . Arthritis    of the neck  . Colon polyps   . Esophageal stricture   . GERD (gastroesophageal reflux disease)    Hiatal Hernia  . History of uterine cancer   . Hyperlipidemia   . Hypertension   . Osteoporosis   . Pacemaker   . Symptomatic bradycardia     ROS:   All systems reviewed and negative except as noted in the HPI.   Past Surgical History:  Procedure Laterality  Date  . APPENDECTOMY    . CHOLECYSTECTOMY    . LAPAROSCOPIC TOTAL HYSTERECTOMY    . LYMPH NODE DISSECTION     bilateral pelvic and right-sided periaortic  . PACEMAKER GENERATOR CHANGE N/A 05/21/2014   Procedure: PACEMAKER GENERATOR CHANGE;  Surgeon: Evans Lance, MD;  Location: [facility] CATH LAB;  Service: Cardiovascular;  Laterality: N/A;  . status post pacemaker     metronic [id number]  . TRANSTHORACIC ECHOCARDIOGRAM  08/30/04  . TUBAL LIGATION       Family History  Problem Relation Age of Onset  . Heart disease Mother   . Heart attack Mother   . Heart disease Father   . Heart attack Father   . Cancer Sister        breast  . Alcohol abuse Brother   . COPD Sister   . Healthy Daughter      Social History   Socioeconomic History  . Marital status: Divorced    Spouse name: Not on file  . Number of children: 1  . Years of education: GED  . Highest education level: GED or equivalent  Occupational History  . Occupation: retired    Comment: Engineer, manufacturing systems  Tobacco Use  . Smoking status: Never Smoker  . Smokeless tobacco: Never Used  Substance and Sexual Activity  . Alcohol use: No    Comment: may have a social drink rarely  . Drug use: No  . Sexual activity: Not Currently  Other Topics Concern  . Not on file  Social History Narrative  . Not on file   Social Determinants of Health   Financial Resource Strain: Low Risk   . Difficulty of Paying Living Expenses: Not very hard  Food Insecurity: No Food Insecurity  . Worried About Charity fundraiser in the Last Year: Never true  . Ran Out of Food in the Last Year: Never true  Transportation Needs: No Transportation Needs  . Lack of Transportation (Medical): No  . Lack of Transportation (Non-Medical): No  Physical Activity: Inactive  . Days of Exercise per Week: 0 days  . Minutes of Exercise per Session: 0 min  Stress: No Stress Concern Present  . Feeling of Stress : Not at all  Social Connections: Not Isolated  .  Frequency of Communication with Friends and Family: More than three times a week  . Frequency of Social Gatherings with Friends and Family: More than three times a week  . Attends Religious Services: More than 4 times per year  . Active Member of Clubs or Organizations: Yes  . Attends Archivist Meetings: More than 4 times per year  . Marital Status: Living with partner  Intimate Partner Violence: Not At Risk  . Fear of Current or Ex-Partner: No  . Emotionally Abused: No  . Physically Abused: No  . Sexually Abused: No     BP (!) 150/80   Pulse 70   Ht _0  (1.626 m)   Wt 161 lb (73 kg)   SpO2 97%   BMI 27.64 kg/m   Physical Exam:  Well appearing 69 yo woman, NAD HEENT: Unremarkable Neck:  No JVD, no thyromegally Lymphatics:  No adenopathy Back:  No CVA tenderness Lungs:  Clear with no wheezes HEART:  Regular rate rhythm, no murmurs, no rubs, no clicks Abd:  soft, positive bowel sounds, no organomegally, no rebound, no guarding Ext:  2 plus pulses, no edema, no cyanosis, no clubbing Skin:  No rashes no nodules Neuro:  CN II through XII intact, motor grossly intact  EKG - nsr at 70  DEVICE  Normal device function.  See PaceArt for details.   Assess/Plan: 1. Sinus node dysfunction - she is asymptomatic s/p PPM insertion.  2. 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 We will recheck in several months. 3. HTN - her SBP is elevated and I have encouraged her to reduce her sodium intake. She notes that her bp has been better at home.  Mikle Bosworth.D

## 2020-01-06 LAB — CUP PACEART INCLINIC DEVICE CHECK
Date Time Interrogation Session: 20210120080632
Implantable Lead Implant Date: 20050914
Implantable Lead Implant Date: 20050914
Implantable Lead Location: 753859
Implantable Lead Location: 753860
Implantable Lead Model: 5076
Implantable Lead Model: 5076
Implantable Pulse Generator Implant Date: 20150605

## 2020-01-08 ENCOUNTER — Ambulatory Visit: Payer: PPO | Admitting: *Deleted

## 2020-01-08 DIAGNOSIS — E119 Type 2 diabetes mellitus without complications: Secondary | ICD-10-CM

## 2020-01-08 DIAGNOSIS — E1159 Type 2 diabetes mellitus with other circulatory complications: Secondary | ICD-10-CM

## 2020-01-08 DIAGNOSIS — I10 Essential (primary) hypertension: Secondary | ICD-10-CM | POA: Diagnosis not present

## 2020-01-08 DIAGNOSIS — E785 Hyperlipidemia, unspecified: Secondary | ICD-10-CM | POA: Diagnosis not present

## 2020-01-08 DIAGNOSIS — E1169 Type 2 diabetes mellitus with other specified complication: Secondary | ICD-10-CM

## 2020-01-08 DIAGNOSIS — I152 Hypertension secondary to endocrine disorders: Secondary | ICD-10-CM

## 2020-01-08 NOTE — Chronic Care Management (AMB) (Signed)
Chronic Care Management   Follow Up Note   01/08/2020 Name: [name] MRN: [id number] DOB: 1951/02/08  Referred by: Janora Norlander, DO Reason for referral : Chronic Care Management (RN follow up)   [name] is a 69 y.o. year old female who is a primary care patient of Janora Norlander, DO. The CCM team was consulted for assistance with chronic disease management and care coordination needs.    Review of patient status, including review of consultants reports, relevant laboratory and other test results, and collaboration with appropriate care team members and the patient's provider was performed as part of comprehensive patient evaluation and provision of chronic care management services.    Subjective: I spoke with [name] by telephone today. She reports that her Covid symptoms have resolved. She is undergoing treatment by her gynecologist, Dr Evie Lacks, for Group B Strep vaginal infection. She had a recent follow-up with her cardiologist and has been asked to f/u with him in one year.    Objective:  Outpatient Encounter Medications as of 01/08/2020  Medication Sig  . amoxicillin-clavulanate (AUGMENTIN) 875-125 MG tablet Take 1 tablet by mouth 2 (two) times daily.  Marland Kitchen aspirin EC 81 MG EC tablet Take 1 tablet (81 mg total) by mouth daily.  . Blood Glucose Monitoring Suppl (ONE TOUCH ULTRA 2) w/Device KIT 1 Device by Does not apply route daily.  Marland Kitchen BYSTOLIC 5 MG tablet TAKE  (1)  TABLET TWICE A DAY.  . calcium citrate-vitamin D (CITRACAL+D) 315-200 MG-UNIT per tablet Take 1 tablet by mouth daily.   Marland Kitchen estradiol (ESTRACE) 0.1 MG/GM vaginal cream Place 1 Applicatorful vaginally at bedtime.  Marland Kitchen glucose blood test strip Use to check BG up to qd  . metFORMIN (GLUCOPHAGE) 500 MG tablet Take 1 tablet (500 mg total) by mouth 2 (two) times daily with a meal.  . ONETOUCH DELICA LANCETS FINE MISC Use to check BG up to qd  . pantoprazole (PROTONIX) 40 MG tablet Take 1 tablet (40 mg total)  by mouth daily.  . rosuvastatin (CRESTOR) 5 MG tablet Take 1 tablet (5 mg total) by mouth daily at 6 PM.  . sertraline (ZOLOFT) 50 MG tablet Take 1 tablet (50 mg total) by mouth daily.   No facility-administered encounter medications on file as of 01/08/2020.      BP Readings from Last 3 Encounters:  01/05/20 (!) 150/80  12/03/19 123/76  09/07/19 136/82     RN Assessment & Care Plan   . Blood Pressure Management        Current Barriers:  . Chronic Disease Management support and education needs related to hypertension.  Nurse Case Manager Clinical Goal(s):  Marland Kitchen Over the next 90 days, the patient will demonstrate ongoing self health care management ability as evidenced by decreasing salt intake*  Interventions:  . Evaluation of current treatment plan related to hypertension and patient's adherence to plan as established by provider. . Chart reviewed, including recent cardiology office notes where SBP was elevated . Advised patient to decrease salt intake . Reviewed medications with patient and discussed Bystolic . Discussed plans with patient for ongoing care management follow up and provided patient with direct contact information for care management team . Encouraged patient to check and record blood pressure at home several times a week . Contact provider with any readings outside of recommended range  Patient Self Care Activities:  . Performs ADL's independently . Performs IADL's independently  Initial goal documentation       .  COMPLETED: Improvement in Covid Like Symptoms       Covid-like symptoms in a patient with diabetes and hypertension.    Current Barriers:  Marland Kitchen Knowledge Deficits related to Covid-like symptoms  Nurse Case Manager Clinical Goal(s):  Marland Kitchen Over the next 14 days, patient will continue to see improvement in Covid-like symptoms  Interventions:  . Chart reviewed, including notes from [place] positive for Covid-19 . Talked with patient  and she reports that her symptoms have resolved  Patient Self Care Activities:  . Performs ADL's independently . Performs IADL's independently  Please see past updates related to this goal by clicking on the "Past Updates" button in the selected goal      . Vaginal Irritation       Current Barriers:  Marland Kitchen Knowledge Deficits related to cause and treatment of vaginal irritation  Nurse Case Manager Clinical Goal(s):  Marland Kitchen Over the next 30 days, patient will work with gynecologist to improve/resolve vaginal irritation  Interventions:  . Evaluation of current treatment plan related to vaginal irritation and patient's adherence to plan as established by provider. . Advised patient to keep f/u appointment with Dr Evie Lacks and to take medication as prescribed . Reviewed medications with patient and discussed Estradiol vaginal cream and Augmentin . Discussed plans with patient for ongoing care management follow up and provided patient with direct contact information for care management team  Patient Self Care Activities:  . Performs ADL's independently . Performs IADL's independently  Initial goal documentation         Plan:   The care management team will reach out to the patient again over the next 60 days.    [name], BSN, RN-BC Embedded Chronic Care Manager [facility] [place] [facility] Direct Dial: [phone]

## 2020-01-08 NOTE — Patient Instructions (Signed)
Visit Information  Goals Addressed            This Visit's Progress     Patient Stated   . Blood Pressure Management (pt-stated)       Current Barriers:  . Chronic Disease Management support and education needs related to hypertension.  Nurse Case Manager Clinical Goal(s):  Marland Kitchen Over the next 90 days, the patient will demonstrate ongoing self health care management ability as evidenced by decreasing salt intake*  Interventions:  . Evaluation of current treatment plan related to hypertension and patient's adherence to plan as established by provider. . Chart reviewed, including recent cardiology office notes where SBP was elevated . Advised patient to decrease salt intake . Reviewed medications with patient and discussed Bystolic . Discussed plans with patient for ongoing care management follow up and provided patient with direct contact information for care management team . Encouraged patient to check and record blood pressure at home several times a week . Contact provider with any readings outside of recommended range  Patient Self Care Activities:  . Performs ADL's independently . Performs IADL's independently  Initial goal documentation       Other   . COMPLETED: Improvement in Covid Like Symptoms       Covid-like symptoms in a patient with diabetes and hypertension.    Current Barriers:  Marland Kitchen Knowledge Deficits related to Covid-like symptoms  Nurse Case Manager Clinical Goal(s):  Marland Kitchen Over the next 14 days, patient will continue to see improvement in Covid-like symptoms  Interventions:  . Chart reviewed, including notes from [facility] positive for Covid-19 . Talked with patient and she reports that her symptoms have resolved  Patient Self Care Activities:  . Performs ADL's independently . Performs IADL's independently  Please see past updates related to this goal by clicking on the "Past Updates" button in the selected goal      . Vaginal Irritation        Current Barriers:  Marland Kitchen Knowledge Deficits related to cause and treatment of vaginal irritation  Nurse Case Manager Clinical Goal(s):  Marland Kitchen Over the next 30 days, patient will work with gynecologist to improve/resolve vaginal irritation  Interventions:  . Evaluation of current treatment plan related to vaginal irritation and patient's adherence to plan as established by provider. . Advised patient to keep f/u appointment with Dr Evie Lacks and to take medication as prescribed . Reviewed medications with patient and discussed Estradiol vaginal cream and Augmentin . Discussed plans with patient for ongoing care management follow up and provided patient with direct contact information for care management team  Patient Self Care Activities:  . Performs ADL's independently . Performs IADL's independently  Initial goal documentation       The care management team will reach out to the patient again over the next 60 days.   [name], BSN, RN-BC Embedded Chronic Care Manager [facility] Direct Dial: [phone]   The patient verbalized understanding of instructions provided today and declined a print copy of patient instruction materials.

## 2020-01-18 ENCOUNTER — Other Ambulatory Visit: Payer: Self-pay | Admitting: Internal Medicine

## 2020-01-18 ENCOUNTER — Other Ambulatory Visit: Payer: Self-pay | Admitting: Family Medicine

## 2020-01-18 DIAGNOSIS — I10 Essential (primary) hypertension: Secondary | ICD-10-CM

## 2020-01-28 DIAGNOSIS — N760 Acute vaginitis: Secondary | ICD-10-CM | POA: Diagnosis not present

## 2020-02-18 ENCOUNTER — Ambulatory Visit (INDEPENDENT_AMBULATORY_CARE_PROVIDER_SITE_OTHER): Payer: PPO | Admitting: *Deleted

## 2020-02-18 DIAGNOSIS — Z950 Presence of cardiac pacemaker: Secondary | ICD-10-CM

## 2020-02-19 ENCOUNTER — Telehealth: Payer: Self-pay | Admitting: Internal Medicine

## 2020-02-19 LAB — CUP PACEART REMOTE DEVICE CHECK
Battery Impedance: 324 Ohm
Battery Remaining Longevity: 118 mo
Battery Voltage: 2.78 V
Brady Statistic AP VP Percent: 0 %
Brady Statistic AP VS Percent: 0 %
Brady Statistic AS VP Percent: 0 %
Brady Statistic AS VS Percent: 100 %
Date Time Interrogation Session: 20210305101110
Implantable Lead Implant Date: 20050914
Implantable Lead Implant Date: 20050914
Implantable Lead Location: 753859
Implantable Lead Location: 753860
Implantable Lead Model: 5076
Implantable Lead Model: 5076
Implantable Pulse Generator Implant Date: 20150605
Lead Channel Impedance Value: 513 Ohm
Lead Channel Impedance Value: 541 Ohm
Lead Channel Pacing Threshold Amplitude: 0.5 V
Lead Channel Pacing Threshold Amplitude: 0.875 V
Lead Channel Pacing Threshold Pulse Width: 0.4 ms
Lead Channel Pacing Threshold Pulse Width: 0.4 ms
Lead Channel Setting Pacing Amplitude: 2 V
Lead Channel Setting Pacing Amplitude: 2.5 V
Lead Channel Setting Pacing Pulse Width: 0.4 ms
Lead Channel Setting Sensing Sensitivity: 5.6 mV

## 2020-02-19 NOTE — Telephone Encounter (Signed)
New message   Patient is having issues with transmitting from her device. Please advise.

## 2020-02-19 NOTE — Telephone Encounter (Signed)
I let the pt know we did receive the transmission she was trying to send. The pt thanked me for calling to let her know.

## 2020-02-19 NOTE — Progress Notes (Signed)
PPM Remote  

## 2020-02-25 ENCOUNTER — Ambulatory Visit: Payer: PPO | Attending: Internal Medicine

## 2020-02-25 DIAGNOSIS — Z23 Encounter for immunization: Secondary | ICD-10-CM

## 2020-02-25 NOTE — Progress Notes (Signed)
   Covid-19 Vaccination Clinic  Name:  [name]    MRN: [id number] DOB: 10/13/1951  02/25/2020  [name] was observed post Covid-19 immunization for 15 minutes without incident. She was provided with Vaccine Information Sheet and instruction to access the V-Safe system.   [name] was instructed to call 911 with any severe reactions post vaccine: Marland Kitchen Difficulty breathing  . Swelling of face and throat  . A fast heartbeat  . A bad rash all over body  . Dizziness and weakness   Immunizations Administered    Name Date Dose VIS Date Route   Moderna COVID-19 Vaccine 02/25/2020 11:07 AM 0.5 mL 11/17/2019 Intramuscular   Manufacturer: Moderna   Lot: [id number]   [id number]

## 2020-03-02 DIAGNOSIS — H905 Unspecified sensorineural hearing loss: Secondary | ICD-10-CM | POA: Diagnosis not present

## 2020-03-08 ENCOUNTER — Ambulatory Visit: Payer: PPO | Admitting: Family Medicine

## 2020-03-09 ENCOUNTER — Encounter: Payer: Self-pay | Admitting: Family Medicine

## 2020-03-09 ENCOUNTER — Ambulatory Visit (INDEPENDENT_AMBULATORY_CARE_PROVIDER_SITE_OTHER): Payer: PPO

## 2020-03-09 ENCOUNTER — Ambulatory Visit (INDEPENDENT_AMBULATORY_CARE_PROVIDER_SITE_OTHER): Payer: PPO | Admitting: Family Medicine

## 2020-03-09 ENCOUNTER — Other Ambulatory Visit: Payer: Self-pay

## 2020-03-09 VITALS — BP 124/78 | HR 72 | Temp 97.7°F | Ht 64.0 in | Wt 160.0 lb

## 2020-03-09 DIAGNOSIS — M8588 Other specified disorders of bone density and structure, other site: Secondary | ICD-10-CM | POA: Diagnosis not present

## 2020-03-09 DIAGNOSIS — E1169 Type 2 diabetes mellitus with other specified complication: Secondary | ICD-10-CM

## 2020-03-09 DIAGNOSIS — M8589 Other specified disorders of bone density and structure, multiple sites: Secondary | ICD-10-CM

## 2020-03-09 DIAGNOSIS — M542 Cervicalgia: Secondary | ICD-10-CM | POA: Diagnosis not present

## 2020-03-09 DIAGNOSIS — E1165 Type 2 diabetes mellitus with hyperglycemia: Secondary | ICD-10-CM

## 2020-03-09 DIAGNOSIS — E119 Type 2 diabetes mellitus without complications: Secondary | ICD-10-CM | POA: Diagnosis not present

## 2020-03-09 DIAGNOSIS — M898X1 Other specified disorders of bone, shoulder: Secondary | ICD-10-CM | POA: Diagnosis not present

## 2020-03-09 DIAGNOSIS — I10 Essential (primary) hypertension: Secondary | ICD-10-CM

## 2020-03-09 DIAGNOSIS — I152 Hypertension secondary to endocrine disorders: Secondary | ICD-10-CM

## 2020-03-09 DIAGNOSIS — E1159 Type 2 diabetes mellitus with other circulatory complications: Secondary | ICD-10-CM

## 2020-03-09 DIAGNOSIS — E559 Vitamin D deficiency, unspecified: Secondary | ICD-10-CM | POA: Diagnosis not present

## 2020-03-09 DIAGNOSIS — N898 Other specified noninflammatory disorders of vagina: Secondary | ICD-10-CM

## 2020-03-09 DIAGNOSIS — M19011 Primary osteoarthritis, right shoulder: Secondary | ICD-10-CM | POA: Diagnosis not present

## 2020-03-09 DIAGNOSIS — E785 Hyperlipidemia, unspecified: Secondary | ICD-10-CM | POA: Diagnosis not present

## 2020-03-09 LAB — WET PREP FOR TRICH, YEAST, CLUE
Clue Cell Exam: NEGATIVE
Trichomonas Exam: NEGATIVE
Yeast Exam: NEGATIVE

## 2020-03-09 LAB — BAYER DCA HB A1C WAIVED: HB A1C (BAYER DCA - WAIVED): 7.5 % — ABNORMAL HIGH (ref <7.0)

## 2020-03-09 MED ORDER — FLUCONAZOLE 150 MG PO TABS: 150.0000 mg | ORAL_TABLET | ORAL | 0 refills | Status: DC

## 2020-03-09 NOTE — Patient Instructions (Signed)
A1c 7.5 today.  Sugar is high.  Go back to 1 tablet of Metformin 2 times daily with meals.  See me in 3 months for recheck.  I am sending diflucan in for the yeast to take once per week for 4 weeks to see if we can clear this up while your sugar gets under better control.  I suspect this is why you are getting recurrent yeast infections.  I will contact you with the results of your xray.

## 2020-03-09 NOTE — Progress Notes (Signed)
Subjective: CC: DM, HTN, HLD PCP: Janora Norlander, DO [id number] [name] is a 69 y.o. female presenting to clinic today for:  1. Type 2 Diabetes w/ hypertension/ hyperlipidemia:  Patient reports compliance with metformin 500 mg qam and 52m qpm, Crestor 5 mg daily and Bystolic.   Last eye exam: needs Last foot exam: UTD Last A1c:  Lab Results  Component Value Date   HGBA1C 6.6 09/07/2019   Nephropathy screen indicated?: UTD Last flu, zoster and/or pneumovax:  Immunization History  Administered Date(s) Administered  . Fluad Quad(high Dose 65+) 09/07/2019  . Influenza,inj,Quad PF,6+ Mos 10/29/2016, 01/03/2018, 10/08/2018  . Moderna SARS-COVID-2 Vaccination 02/25/2020  . Pneumococcal Conjugate-13 08/02/2017  . Pneumococcal Polysaccharide-23 08/04/2018    ROS: No chest pain, shortness of breath, lower extremity edema.  2. Vaginal itching/ irritation Saw GYN February 2021 and yeast was isolated.  She does not be treated with an antifungal after her visit.  She is continue to use her Estrace cream and the topical corticosteroid that was recommended.  Itching is primarily within the inner lower lips.  3. Neck pain She reports couple week history of right-sided neck pain that radiates into her right collarbone.  She feels that the right collarbone seems to be more prominent now.  Denies any preceding injury.  Pain is worse at nighttime when she tries to lay on the right side.  ROS: Per HPI  Allergies  Allergen Reactions  . Pravachol [Pravastatin Sodium] Other (See Comments)    Stomach pain and constipation    Past Medical History:  Diagnosis Date  . Anxiety   . Arthritis    of the neck  . Colon polyps   . Esophageal stricture   . GERD (gastroesophageal reflux disease)    Hiatal Hernia  . History of uterine cancer   . Hyperlipidemia   . Hypertension   . Osteoporosis   . Pacemaker   . Symptomatic bradycardia     Current Outpatient Medications:  .   amoxicillin-clavulanate (AUGMENTIN) 875-125 MG tablet, Take 1 tablet by mouth 2 (two) times daily., Disp: , Rfl:  .  aspirin EC 81 MG EC tablet, Take 1 tablet (81 mg total) by mouth daily., Disp: 30 tablet, Rfl: 0 .  Blood Glucose Monitoring Suppl (ONE TOUCH ULTRA 2) w/Device KIT, 1 Device by Does not apply route daily., Disp: 1 each, Rfl: 0 .  BYSTOLIC 5 MG tablet, TAKE (1) TABLET TWICE A DAY., Disp: 60 tablet, Rfl: 11 .  calcium citrate-vitamin D (CITRACAL+D) 315-200 MG-UNIT per tablet, Take 1 tablet by mouth daily. , Disp: , Rfl:  .  estradiol (ESTRACE) 0.1 MG/GM vaginal cream, Place 1 Applicatorful vaginally at bedtime., Disp: , Rfl:  .  glucose blood test strip, Use to check BG up to qd, Disp: 100 each, Rfl: 2 .  metFORMIN (GLUCOPHAGE) 500 MG tablet, Take 1 tablet (500 mg total) by mouth 2 (two) times daily with a meal., Disp: 180 tablet, Rfl: 3 .  ONETOUCH DELICA LANCETS FINE MISC, Use to check BG up to qd, Disp: 100 each, Rfl: 2 .  pantoprazole (PROTONIX) 40 MG tablet, Take 1 tablet (40 mg total) by mouth daily., Disp: 90 tablet, Rfl: 3 .  rosuvastatin (CRESTOR) 5 MG tablet, Take 1 tablet (5 mg total) by mouth daily at 6 PM., Disp: 30 tablet, Rfl: 1 .  sertraline (ZOLOFT) 50 MG tablet, Take 1 tablet (50 mg total) by mouth daily., Disp: 90 tablet, Rfl: 3 Social History   Socioeconomic History  .  Marital status: Divorced    Spouse name: Not on file  . Number of children: 1  . Years of education: GED  . Highest education level: GED or equivalent  Occupational History  . Occupation: retired    Comment: Engineer, manufacturing systems  Tobacco Use  . Smoking status: Never Smoker  . Smokeless tobacco: Never Used  Substance and Sexual Activity  . Alcohol use: No    Comment: may have a social drink rarely  . Drug use: No  . Sexual activity: Not Currently  Other Topics Concern  . Not on file  Social History Narrative  . Not on file   Social Determinants of Health   Financial Resource Strain: Low  Risk   . Difficulty of Paying Living Expenses: Not very hard  Food Insecurity: No Food Insecurity  . Worried About Charity fundraiser in the Last Year: Never true  . Ran Out of Food in the Last Year: Never true  Transportation Needs: No Transportation Needs  . Lack of Transportation (Medical): No  . Lack of Transportation (Non-Medical): No  Physical Activity: Inactive  . Days of Exercise per Week: 0 days  . Minutes of Exercise per Session: 0 min  Stress: No Stress Concern Present  . Feeling of Stress : Not at all  Social Connections: Not Isolated  . Frequency of Communication with Friends and Family: More than three times a week  . Frequency of Social Gatherings with Friends and Family: More than three times a week  . Attends Religious Services: More than 4 times per year  . Active Member of Clubs or Organizations: Yes  . Attends Archivist Meetings: More than 4 times per year  . Marital Status: Living with partner  Intimate Partner Violence: Not At Risk  . Fear of Current or Ex-Partner: No  . Emotionally Abused: No  . Physically Abused: No  . Sexually Abused: No   Family History  Problem Relation Age of Onset  . Heart disease Mother   . Heart attack Mother   . Heart disease Father   . Heart attack Father   . Cancer Sister        breast  . Alcohol abuse Brother   . COPD Sister   . Healthy Daughter     Objective: Office vital signs reviewed. BP 124/78   Pulse 72   Temp 97.7 F (36.5 C) (Temporal)   Ht 5' 4" (1.626 m)   Wt 160 lb (72.6 kg)   SpO2 95%   BMI 27.46 kg/m   Physical Examination:  General: Awake, alert, well nourished, No acute distress HEENT: Normal, sclera white, MMM Cardio: regular rate  Pulm: Normal work of breathing on room air Extremities: warm, well perfused, No edema, cyanosis or clubbing; +2 pulses bilaterally MSK:  C-spine: Active range of motion appears preserved.  No midline tenderness palpation.  Clavicle: Right clavicle  certainly appears to be more anteriorly positioned than the left.  No palpable bony abnormalities otherwise GU: External vagina atrophic with mild erythema noted along the inferior labia majora.  She has moderate cheesy white discharge noted within the vaginal vault.  No skin breakdown noted.  Assessment/ Plan: 69 y.o. female   1. Uncontrolled type 2 diabetes mellitus with hyperglycemia (HCC) A1c rising to 7.5.  I recommended that she resume use of her Metformin twice daily.  May need to consider adding glipizide back.  I stressed to her that the likelihood is that she is developing recurrent yeast infection secondary  to uncontrolled blood sugars.  She will see me back in 3 months.  She will schedule her diabetic eye exam - Bayer DCA Hb A1c Waived - CMP14+EGFR  2. Hyperlipidemia associated with type 2 diabetes mellitus ([place]) - CMP14+EGFR  3. Hypertension associated with diabetes ([place]) - CMP14+EGFR  4. Vitamin D deficiency - VITAMIN D 25 Hydroxy (Vit-D Deficiency, Fractures)  5. Osteopenia of multiple sites - DG [facility] DEXA; Future  6. Vagina itching Clinically consistent with yeast vaginitis.  I am going to put her on a 4-week course of Diflucan.  Again sugar control was reinforced. - WET PREP FOR TRICH, YEAST, CLUE - fluconazole (DIFLUCAN) 150 MG tablet; Take 1 tablet (150 mg total) by mouth every 7 (seven) days. Then stop.  Dispense: 4 tablet; Refill: 0  7. Neck pain on right side ?  Degenerative changes - DG Cervical Spine 2 or 3 views; Future  8. Pain of right clavicle Also appears to be a displaced clavicle.  - DG Clavicle Right; Future   No orders of the defined types were placed in this encounter.  No orders of the defined types were placed in this encounter.    Janora Norlander, DO [place] [phone]

## 2020-03-10 LAB — CMP14+EGFR
ALT: 15 IU/L (ref 0–32)
AST: 12 IU/L (ref 0–40)
Albumin/Globulin Ratio: 1.5 (ref 1.2–2.2)
Albumin: 4.3 g/dL (ref 3.8–4.8)
Alkaline Phosphatase: 83 IU/L (ref 39–117)
BUN/Creatinine Ratio: 19 (ref 12–28)
BUN: 15 mg/dL (ref 8–27)
Bilirubin Total: 0.5 mg/dL (ref 0.0–1.2)
CO2: 25 mmol/L (ref 20–29)
Calcium: 9.8 mg/dL (ref 8.7–10.3)
Chloride: 102 mmol/L (ref 96–106)
Creatinine, Ser: 0.8 mg/dL (ref 0.57–1.00)
GFR calc Af Amer: 88 mL/min/{1.73_m2} (ref >59)
GFR calc non Af Amer: 76 mL/min/{1.73_m2} (ref >59)
Globulin, Total: 2.9 g/dL (ref 1.5–4.5)
Glucose: 176 mg/dL — ABNORMAL HIGH (ref 65–99)
Potassium: 4.2 mmol/L (ref 3.5–5.2)
Sodium: 142 mmol/L (ref 134–144)
Total Protein: 7.2 g/dL (ref 6.0–8.5)

## 2020-03-10 LAB — VITAMIN D 25 HYDROXY (VIT D DEFICIENCY, FRACTURES): Vit D, 25-Hydroxy: 31.7 ng/mL (ref 30.0–100.0)

## 2020-03-11 IMAGING — CT CT HEAD W/O CM
4 series · 16 of 47 positions shown, 18 images · non-contrast
Comparison: 02/13/2019 CT head and CTA head.

CLINICAL DATA: 67 y/o  F; stroke for follow-up.

EXAM:
CT HEAD WITHOUT CONTRAST
TECHNIQUE: Contiguous axial images were obtained from the base of the skull
through the vertex without intravenous contrast.

[Series 3: head bone · axial · 0.39mm/px · z∈[-58,-30]mm · 3 of 72 slices shown]
[im 8/72  bone]
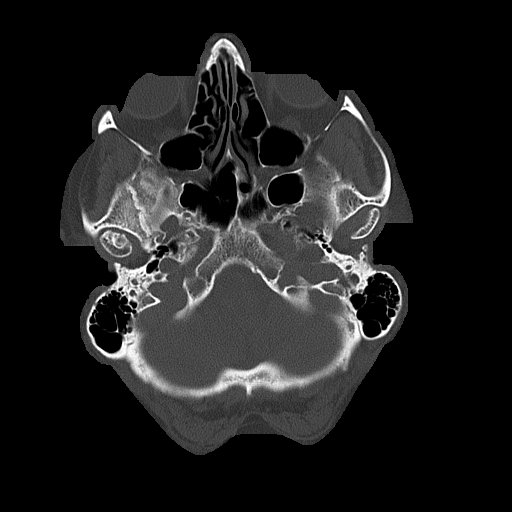
[im 15/72  bone]
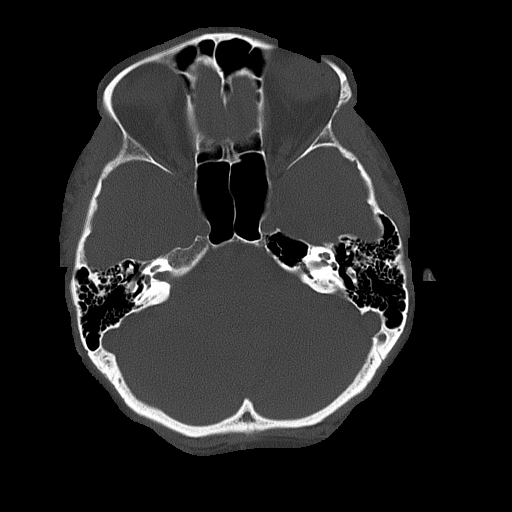
[im 22/72  bone]
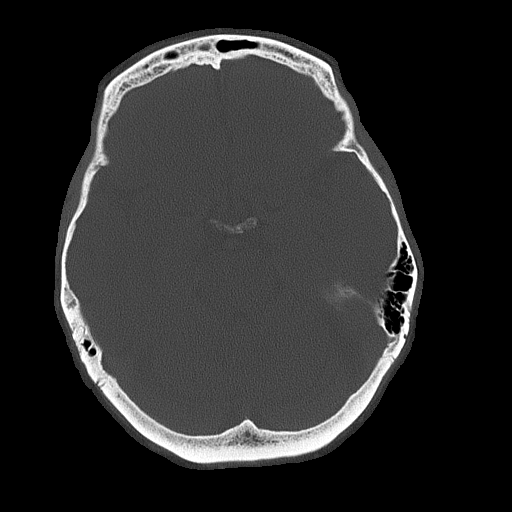

[Series 4: head without cor · coronal · non-contrast · 0.28mm/px · 3 of 67 slices shown]
[im 23/67  brain]
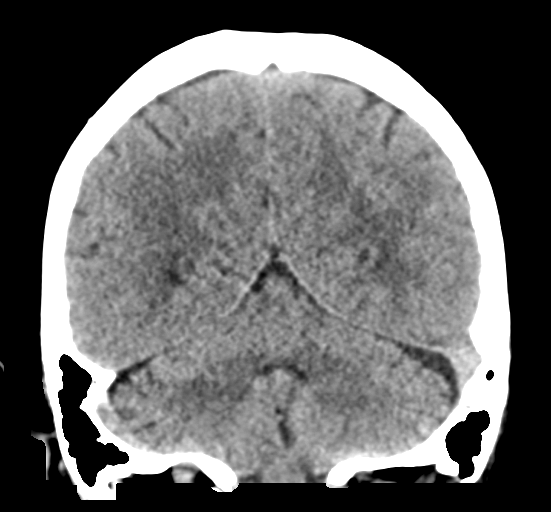
[im 30/67  brain]
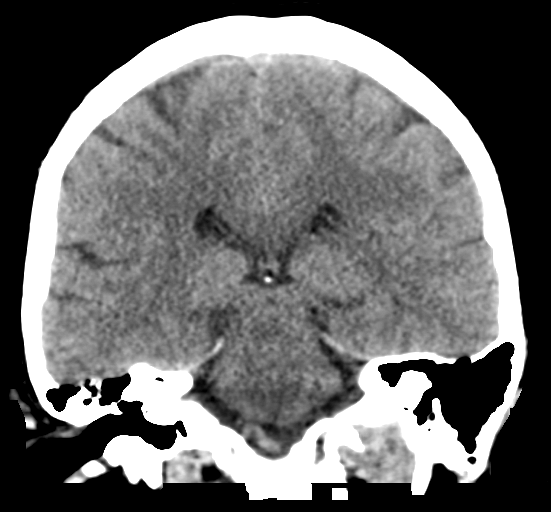
[im 37/67  brain]
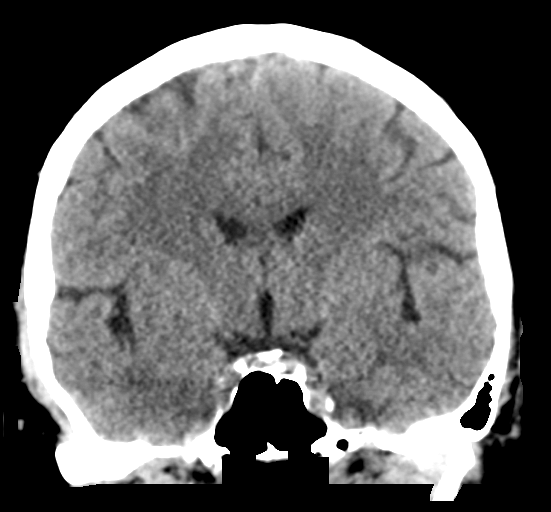

[Series 5: head without sag · sagittal · non-contrast · 0.28mm/px · 3 of 67 slices shown]
[im 23/67  brain]
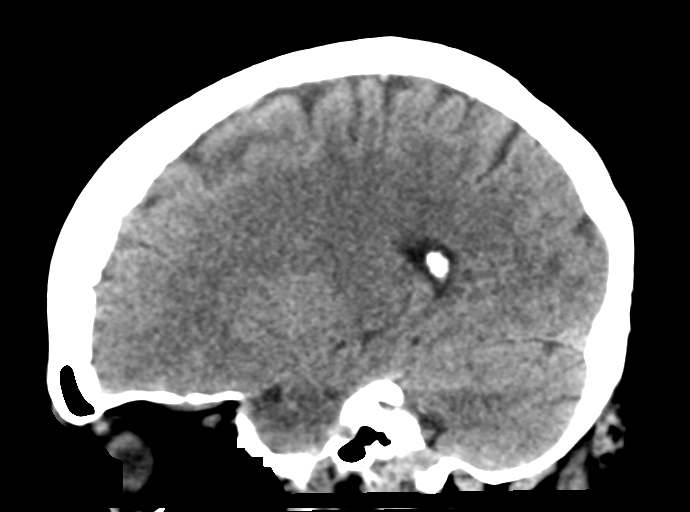
[im 34/67  brain]
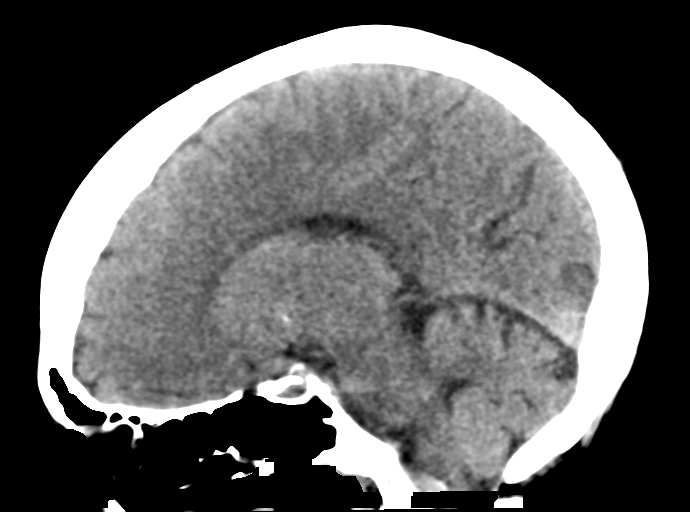
[im 45/67  brain]
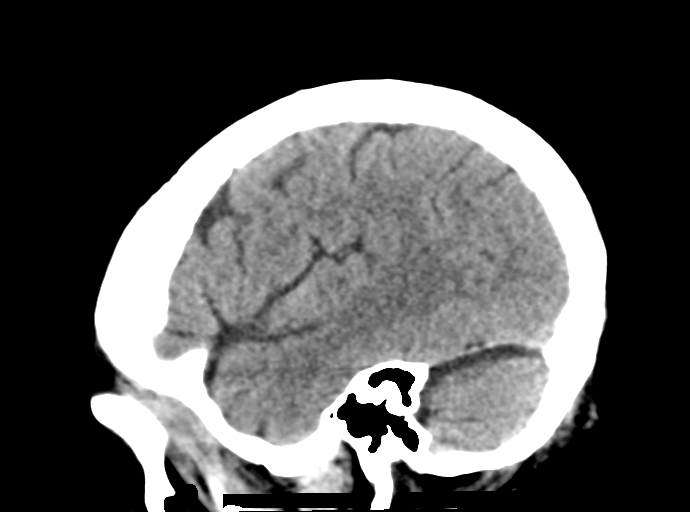

[Series 6: head without · axial · non-contrast · 0.39mm/px · z∈[-57,+48]mm · 7 of 29 slices shown, 9 images]
[im 4/29  brain]
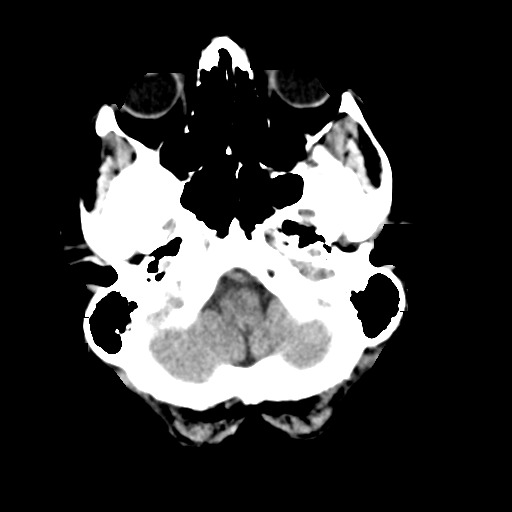
[im 4/29  bone]
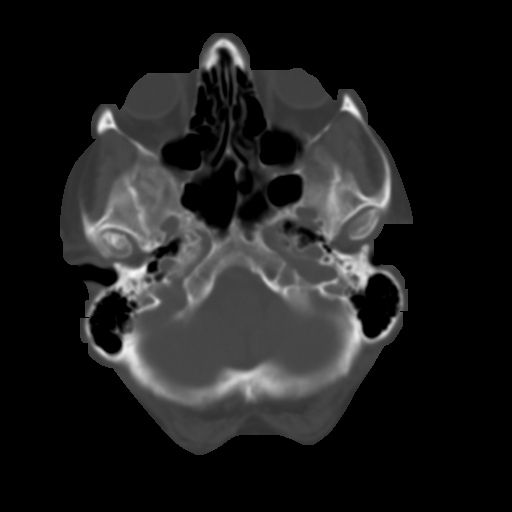
[im 8/29  brain]
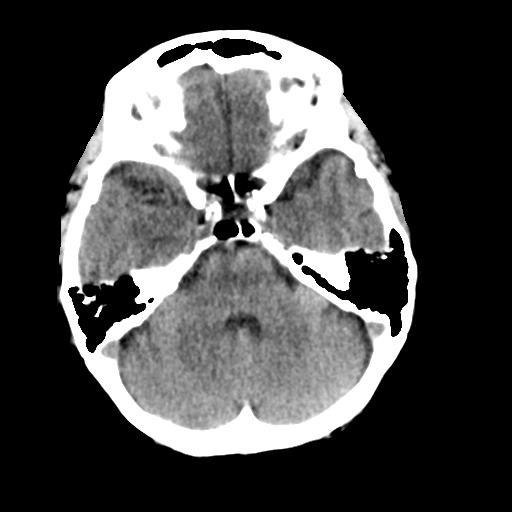
[im 11/29  brain]
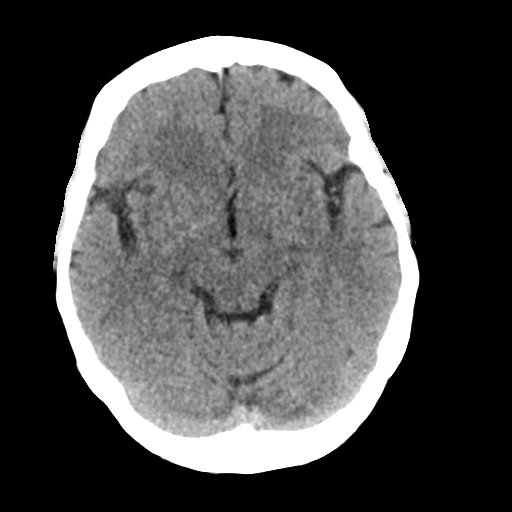
[im 15/29  brain]
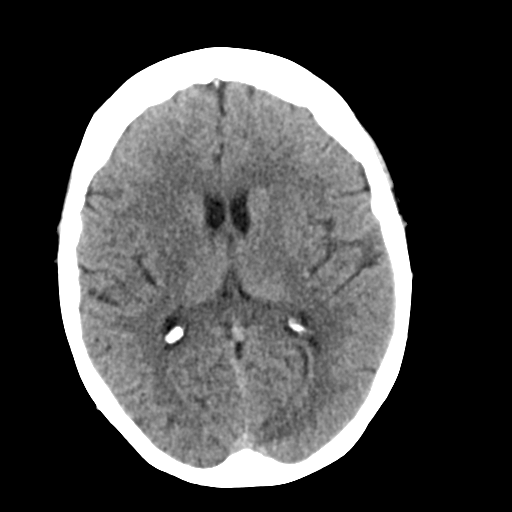
[im 18/29  brain]
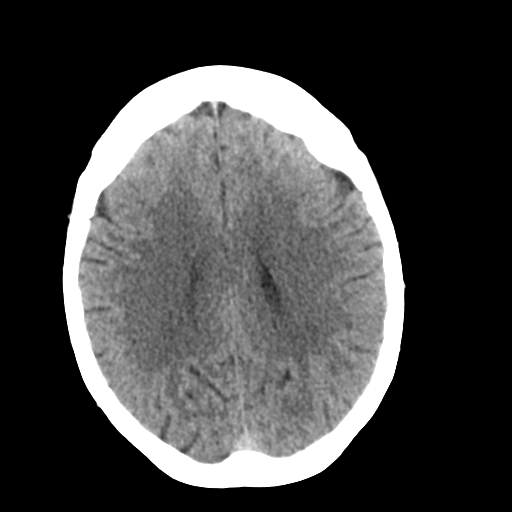
[im 18/29  bone]
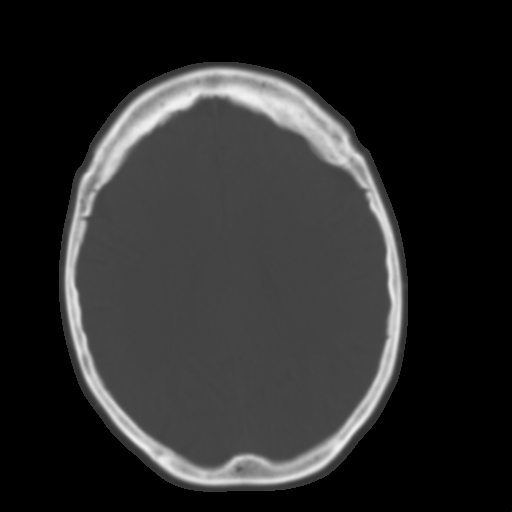
[im 22/29  brain]
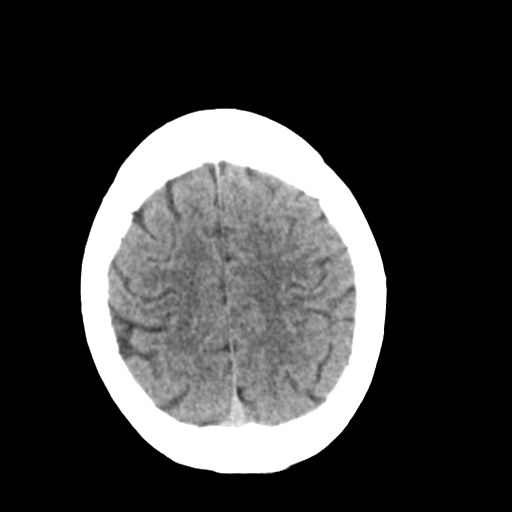
[im 25/29  brain]
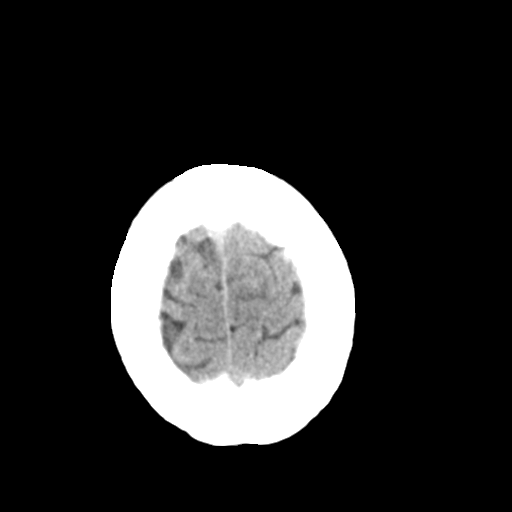

[16 of 47 positions shown; findings below may reference images not displayed]

FINDINGS: Brain: Stable distribution late acute to subacute infarction within
the left occipital lobe. No new findings of stroke, hemorrhage,
extra-axial collection, hydrocephalus, or mass effect. Stable
nonspecific white matter hypodensities are compatible with chronic
microvascular ischemic changes.

Vascular: Calcific atherosclerosis of carotid siphons and vertebral
arteries. Hyperdense vessel.

Skull: Normal. Negative for fracture or focal lesion.

Sinuses/Orbits: No acute finding.

Other: None.
IMPRESSION: Stable distribution of late acute to subacute infarction within the
left occipital lobe. No new acute intracranial abnormality
identified.

## 2020-03-14 DIAGNOSIS — H04123 Dry eye syndrome of bilateral lacrimal glands: Secondary | ICD-10-CM | POA: Diagnosis not present

## 2020-03-14 DIAGNOSIS — H40033 Anatomical narrow angle, bilateral: Secondary | ICD-10-CM | POA: Diagnosis not present

## 2020-03-19 DIAGNOSIS — G44219 Episodic tension-type headache, not intractable: Secondary | ICD-10-CM | POA: Diagnosis not present

## 2020-03-22 DIAGNOSIS — H905 Unspecified sensorineural hearing loss: Secondary | ICD-10-CM | POA: Diagnosis not present

## 2020-03-24 ENCOUNTER — Ambulatory Visit (INDEPENDENT_AMBULATORY_CARE_PROVIDER_SITE_OTHER): Payer: PPO | Admitting: *Deleted

## 2020-03-24 DIAGNOSIS — Z0000 Encounter for general adult medical examination without abnormal findings: Secondary | ICD-10-CM | POA: Diagnosis not present

## 2020-03-24 NOTE — Progress Notes (Addendum)
   MEDICARE ANNUAL WELLNESS VISIT  03/24/2020  Telephone Visit Disclaimer This Medicare AWV was conducted by telephone due to national recommendations for restrictions regarding the COVID-19 Pandemic (e.g. social distancing).  I verified, using two identifiers, that I am speaking with [name] or their authorized healthcare agent. I discussed the limitations, risks, security, and privacy concerns of performing an evaluation and management service by telephone and the potential availability of an in-person appointment in the future. The patient expressed understanding and agreed to proceed.   Subjective:  [name] is a 69 y.o. female patient of Gottschalk, Ashly M, DO who had a Medicare Annual Wellness Visit today via telephone. Ameri is Retired and lives with an adult companion. she has 1 children. she reports that she is socially active and does interact with friends/family regularly. she is not physically active and enjoys reading.  Patient Care Team: Gottschalk, Ashly M, DO as PCP - General (Family Medicine) Taylor, Gregg W, MD as Consulting Physician (Cardiology) Le, Yen Thi Hong, MD as Referring Physician (Optometry) Hudy, Kristen N, RN as Registered Nurse  Advanced Directives 03/24/2020 02/14/2019 02/13/2019 02/13/2019 08/04/2018 08/02/2017 05/21/2014  Does Patient Have a Medical Advance Directive? Yes - Yes Yes Yes Yes Patient does not have advance directive;Patient would like information  Type of Advance Directive Living will Healthcare Power of Attorney Healthcare Power of Attorney Living will Living will;Healthcare Power of Attorney Healthcare Power of Attorney;Living will -  Does patient want to make changes to medical advance directive? No - Patient declined No - Patient declined - - No - Patient declined Yes (MAU/Ambulatory/Procedural Areas - Information given) -  Copy of Healthcare Power of Attorney in Chart? - No - copy requested No - copy requested - No - copy requested No -  copy requested -  Would patient like information on creating a medical advance directive? - - - No - Patient declined No - Patient declined - Advance directive packet given    Hospital Utilization Over the Past 12 Months: # of hospitalizations or ER visits: 0 # of surgeries: 0  Review of Systems    Patient reports that her overall health is worse compared to last year.  History obtained from the patient  Patient Reported Readings (BP, Pulse, CBG, Weight, etc) none  Pain Assessment Pain : No/denies pain     Current Medications & Allergies (verified) Allergies as of 03/24/2020       Reactions   Pravachol [pravastatin Sodium] Other (See Comments)   Stomach pain and constipation        Medication List        Accurate as of March 24, 2020  2:59 PM. If you have any questions, ask your nurse or doctor.          aspirin 81 MG EC tablet Take 1 tablet (81 mg total) by mouth daily.   Bystolic 5 MG tablet Generic drug: nebivolol TAKE (1) TABLET TWICE A DAY.   calcium citrate-vitamin D 315-200 MG-UNIT tablet Commonly known as: CITRACAL+D Take 1 tablet by mouth daily.   estradiol 0.1 MG/GM vaginal cream Commonly known as: ESTRACE Place 1 Applicatorful vaginally at bedtime.   fluconazole 150 MG tablet Commonly known as: DIFLUCAN Take 1 tablet (150 mg total) by mouth every 7 (seven) days. Then stop.   glucose blood test strip Use to check BG up to qd   metFORMIN 500 MG tablet Commonly known as: GLUCOPHAGE Take 1 tablet (500 mg total) by mouth 2 (two)   times daily with a meal.   ONE TOUCH ULTRA 2 w/Device Kit 1 Device by Does not apply route daily.   OneTouch Delica Lancets Fine Misc Use to check BG up to qd   pantoprazole 40 MG tablet Commonly known as: PROTONIX Take 1 tablet (40 mg total) by mouth daily.   rosuvastatin 5 MG tablet Commonly known as: CRESTOR Take 1 tablet (5 mg total) by mouth daily at 6 PM.   sertraline 50 MG tablet Commonly known as:  ZOLOFT Take 1 tablet (50 mg total) by mouth daily.        History (reviewed): Past Medical History:  Diagnosis Date   Anxiety    Arthritis    of the neck   Colon polyps    Esophageal stricture    Essential hypertension 01/05/2020   GERD (gastroesophageal reflux disease)    Hiatal Hernia   History of uterine cancer    Hyperlipidemia    Hypertension    Osteoporosis    Pacemaker    Symptomatic bradycardia    Past Surgical History:  Procedure Laterality Date   APPENDECTOMY     CHOLECYSTECTOMY     LAPAROSCOPIC TOTAL HYSTERECTOMY     LYMPH NODE DISSECTION     bilateral pelvic and right-sided periaortic   PACEMAKER GENERATOR CHANGE N/A 05/21/2014   Procedure: PACEMAKER GENERATOR CHANGE;  Surgeon: Gregg W Taylor, MD;  Location: [facility]ATH [facility];  Service: Cardiovascular;  Laterality: N/A;   status post pacemaker     metronic [id number]   TRANSTHORACIC ECHOCARDIOGRAM  08/30/04   TUBAL LIGATION     Family History  Problem Relation Age of Onset   Heart disease Mother    Heart attack Mother    Heart disease Father    Heart attack Father    Cancer Sister        breast   Alcohol abuse Brother    COPD Sister    Healthy Daughter    Social History   Socioeconomic History   Marital status: Divorced    Spouse name: Not on file   Number of children: 1   Years of education: GED   Highest education level: GED or equivalent  Occupational History   Occupation: retired    Comment: textile worker  Tobacco Use   Smoking status: Never Smoker   Smokeless tobacco: Never Used  Substance and Sexual Activity   Alcohol use: No    Comment: may have a social drink rarely   Drug use: No   Sexual activity: Not Currently  Other Topics Concern   Not on file  Social History Narrative   Not on file   Social Determinants of Health   Financial Resource Strain: Low Risk    Difficulty of Paying Living Expenses: Not very hard  Food Insecurity: No Food Insecurity   Worried About Running Out  of Food in the Last Year: Never true   Ran Out of Food in the Last Year: Never true  Transportation Needs: No Transportation Needs   Lack of Transportation (Medical): No   Lack of Transportation (Non-Medical): No  Physical Activity: Inactive   Days of Exercise per Week: 0 days   Minutes of Exercise per Session: 0 min  Stress: No Stress Concern Present   Feeling of Stress : Not at all  Social Connections: Not Isolated   Frequency of Communication with Friends and Family: More than three times a week   Frequency of Social Gatherings with Friends and Family: More than three times a   week   Attends Religious Services: More than 4 times per year   Active Member of Clubs or Organizations: Yes   Attends Club or Organization Meetings: More than 4 times per year   Marital Status: Living with partner    Activities of Daily Living No flowsheet data found.  Patient Education/ Literacy How often do you need to have someone help you when you read instructions, pamphlets, or other written materials from your doctor or pharmacy?: 1 - Never What is the last grade level you completed in school?: GED  Exercise    Diet Patient reports consuming 3 meals a day and 0 snack(s) a day Patient reports that her primary diet is: Diabetic Patient reports that she does have regular access to food.   Depression Screen PHQ 2/9 Scores 03/24/2020 03/09/2020 09/07/2019 07/27/2019 05/27/2019 02/24/2019 02/13/2019  PHQ - 2 Score 0 0 0 0 0 0 0  PHQ- 9 Score - 0 0 - 0 0 -     Fall Risk Fall Risk  03/24/2020 03/09/2020 09/07/2019 05/27/2019 02/24/2019  Falls in the past year? 0 0 0 0 0     Objective:  Donnette J Wholey seemed alert and oriented and she participated appropriately during our telephone visit.  Blood Pressure Weight BMI  BP Readings from Last 3 Encounters:  03/09/20 124/78  01/05/20 (!) 150/80  12/03/19 123/76   Wt Readings from Last 3 Encounters:  03/09/20 160 lb (72.6 kg)  01/05/20 161 lb (73 kg)    12/03/19 161 lb (73 kg)   BMI Readings from Last 1 Encounters:  03/09/20 27.46 kg/m    *Unable to obtain current vital signs, weight, and BMI due to telephone visit type  Hearing/Vision  Reeanna did not seem to have difficulty with hearing/understanding during the telephone conversation Reports that she has had a formal eye exam by an eye care professional within the past year Reports that she has had a formal hearing evaluation within the past year *Unable to fully assess hearing and vision during telephone visit type  Cognitive Function: 6CIT Screen 03/24/2020 03/24/2020  What Year? 0 points 0 points  What month? 0 points 0 points  What time? 0 points 0 points  Count back from 20 0 points -  Months in reverse 0 points -  Repeat phrase 0 points -  Total Score 0 -   (Normal:0-7, Significant for Dysfunction: >8)  Normal Cognitive Function Screening: Yes   Immunization & Health Maintenance Record Immunization History  Administered Date(s) Administered   Fluad Quad(high Dose 65+) 09/07/2019   Influenza,inj,Quad PF,6+ Mos 10/29/2016, 01/03/2018, 10/08/2018   Moderna SARS-COVID-2 Vaccination 02/25/2020   Pneumococcal Conjugate-13 08/02/2017   Pneumococcal Polysaccharide-23 08/04/2018    Health Maintenance  Topic Date Due   OPHTHALMOLOGY EXAM  04/24/2019   URINE MICROALBUMIN  05/26/2020   INFLUENZA VACCINE  07/17/2020   FOOT EXAM  09/06/2020   HEMOGLOBIN A1C  09/09/2020   MAMMOGRAM  09/14/2021   TETANUS/TDAP  10/29/2021   COLONOSCOPY  09/23/2023   DEXA SCAN  Completed   Hepatitis C Screening  Completed   PNA vac Low Risk Adult  Completed       Assessment  This is a routine wellness examination for Saher J Wassmer.  Health Maintenance: Due or Overdue Health Maintenance Due  Topic Date Due   OPHTHALMOLOGY EXAM  04/24/2019    Miarose J Rottmann does not need a referral for Community Assistance: Care Management:   no Social Work:    no Prescription    Assistance:  no Nutrition/Diabetes Education:  no   Plan:  Personalized Goals Goals Addressed             This Visit's Progress    Exercise 150 min/wk Moderate Activity   Not on track    Current Barriers:  Knowledge Deficits related to physical activity reccomendations   Nurse Case Manager Clinical Goal(s):  Over the next 30 days, patient will increase physical activity level as tolerated with a goal of 150 minutes of moderate physical activity a week  Interventions:  Discussed current activities Encouraged increasing physical activity level in addition to her routine movements and chores throughout the day Verbal education provided Written educational materials prepared and will be mailed  Patient Self Care Activities:  Performs ADL's independently Performs IADL's independently  Initial goal documentation      Exercise 150 minutes per week (moderate activity)   Not on track      Personalized Health Maintenance & Screening Recommendations  up to date  Lung Cancer Screening Recommended: no (Low Dose CT Chest recommended if Age 13-80 years, 30 pack-year currently smoking OR have quit w/in past 15 years) Hepatitis C Screening recommended: no HIV Screening recommended: no  Advanced Directives: Written information was not prepared per patient's request.  Referrals & Orders No orders of the defined types were placed in this encounter.   Follow-up Plan Follow-up with Janora Norlander, DO as planned on 06/09/20. Pt has had a diabetic eye exam, I sent request for information today to [facility]. Pt had recent hearing exam and received 2 new hearing aids today. She is up to date on vaccines and health maintenance. Pt voices no concerns, AVS was printed and mailed to patient today.     I have personally reviewed and noted the following in the patient's chart:   Medical and social history Use of alcohol, tobacco or illicit drugs  Current medications and  supplements Functional ability and status Nutritional status Physical activity Advanced directives List of other physician Hospitalizations, surgeries, and ER visits in previous 12 months Vitals Screenings to include cognitive, depression, and falls Referrals and appointments  In addition, I have reviewed and discussed with Caryl Pina certain preventive protocols, quality metrics, and best practice recommendations. A written personalized care plan for preventive services as well as general preventive health recommendations is available and can be mailed to the patient at her request.      Rana Snare, LPN  12/17/9145   I have reviewed and agree with the above AWV documentation.   Evelina Dun, FNP

## 2020-03-29 ENCOUNTER — Ambulatory Visit: Payer: PPO | Attending: Internal Medicine

## 2020-03-29 DIAGNOSIS — Z23 Encounter for immunization: Secondary | ICD-10-CM

## 2020-03-29 NOTE — Progress Notes (Signed)
   Covid-19 Vaccination Clinic  Name:  [name]    MRN: [id number] DOB: 1951-04-14  03/29/2020  [name] was observed post Covid-19 immunization for 15 minutes without incident. She was provided with Vaccine Information Sheet and instruction to access the V-Safe system.   [name] was instructed to call 911 with any severe reactions post vaccine: Marland Kitchen Difficulty breathing  . Swelling of face and throat  . A fast heartbeat  . A bad rash all over body  . Dizziness and weakness   Immunizations Administered    Name Date Dose VIS Date Route   Moderna COVID-19 Vaccine 03/29/2020 11:16 AM 0.5 mL 11/17/2019 Intramuscular   Manufacturer: Moderna   LotHQ:[id number]   AldrichDW:[id number]

## 2020-05-19 ENCOUNTER — Telehealth: Payer: Self-pay | Admitting: Internal Medicine

## 2020-05-19 ENCOUNTER — Ambulatory Visit (INDEPENDENT_AMBULATORY_CARE_PROVIDER_SITE_OTHER): Payer: PPO | Admitting: *Deleted

## 2020-05-19 DIAGNOSIS — R001 Bradycardia, unspecified: Secondary | ICD-10-CM | POA: Diagnosis not present

## 2020-05-19 LAB — CUP PACEART REMOTE DEVICE CHECK
Battery Impedance: 371 Ohm
Battery Remaining Longevity: 113 mo
Battery Voltage: 2.78 V
Brady Statistic AP VP Percent: 0 %
Brady Statistic AP VS Percent: 0 %
Brady Statistic AS VP Percent: 0 %
Brady Statistic AS VS Percent: 100 %
Date Time Interrogation Session: 20210603143614
Implantable Lead Implant Date: 20050914
Implantable Lead Implant Date: 20050914
Implantable Lead Location: 753859
Implantable Lead Location: 753860
Implantable Lead Model: 5076
Implantable Lead Model: 5076
Implantable Pulse Generator Implant Date: 20150605
Lead Channel Impedance Value: 442 Ohm
Lead Channel Impedance Value: 535 Ohm
Lead Channel Pacing Threshold Amplitude: 0.5 V
Lead Channel Pacing Threshold Amplitude: 0.75 V
Lead Channel Pacing Threshold Pulse Width: 0.4 ms
Lead Channel Pacing Threshold Pulse Width: 0.4 ms
Lead Channel Setting Pacing Amplitude: 2 V
Lead Channel Setting Pacing Amplitude: 2.5 V
Lead Channel Setting Pacing Pulse Width: 0.4 ms
Lead Channel Setting Sensing Sensitivity: 5.6 mV

## 2020-05-19 NOTE — Telephone Encounter (Signed)
Called patient to assist. Patient was able to send transmission 05/19/20.

## 2020-05-19 NOTE — Telephone Encounter (Signed)
Patient was having a hard time sending in a remote transmission for her device. She would like someone from the office to call her and help her

## 2020-05-24 NOTE — Progress Notes (Signed)
Remote pacemaker transmission.   

## 2020-06-09 ENCOUNTER — Ambulatory Visit (INDEPENDENT_AMBULATORY_CARE_PROVIDER_SITE_OTHER): Payer: PPO | Admitting: Family Medicine

## 2020-06-09 ENCOUNTER — Other Ambulatory Visit: Payer: Self-pay

## 2020-06-09 ENCOUNTER — Encounter: Payer: Self-pay | Admitting: Family Medicine

## 2020-06-09 VITALS — BP 130/66 | HR 65 | Temp 97.1°F | Ht 64.0 in | Wt 155.6 lb

## 2020-06-09 DIAGNOSIS — N94819 Vulvodynia, unspecified: Secondary | ICD-10-CM | POA: Diagnosis not present

## 2020-06-09 DIAGNOSIS — I10 Essential (primary) hypertension: Secondary | ICD-10-CM | POA: Diagnosis not present

## 2020-06-09 DIAGNOSIS — E785 Hyperlipidemia, unspecified: Secondary | ICD-10-CM

## 2020-06-09 DIAGNOSIS — E1169 Type 2 diabetes mellitus with other specified complication: Secondary | ICD-10-CM

## 2020-06-09 DIAGNOSIS — E1165 Type 2 diabetes mellitus with hyperglycemia: Secondary | ICD-10-CM | POA: Diagnosis not present

## 2020-06-09 DIAGNOSIS — E1159 Type 2 diabetes mellitus with other circulatory complications: Secondary | ICD-10-CM | POA: Diagnosis not present

## 2020-06-09 DIAGNOSIS — K219 Gastro-esophageal reflux disease without esophagitis: Secondary | ICD-10-CM | POA: Diagnosis not present

## 2020-06-09 LAB — BAYER DCA HB A1C WAIVED: HB A1C (BAYER DCA - WAIVED): 6.4 % (ref <7.0)

## 2020-06-09 MED ORDER — METFORMIN HCL 500 MG PO TABS: ORAL_TABLET | ORAL | 3 refills | Status: DC

## 2020-06-09 MED ORDER — PANTOPRAZOLE SODIUM 40 MG PO TBEC: 40.0000 mg | DELAYED_RELEASE_TABLET | Freq: Every day | ORAL | 3 refills | Status: DC

## 2020-06-09 NOTE — Progress Notes (Signed)
Subjective: CC: f/u DM, HTN, HLD PCP: Janora Norlander, DO [name] is a 69 y.o. female presenting to clinic today for:  1. Type 2 Diabetes w/ hypertension/ hyperlipidemia:  Patient reports compliance with metformin 500 mg QAM and 237m QPM (dose was increased to 5051mBID last visit) and 2569mpm, Crestor 5 mg daily and Bystolic.  She notes that she could not tolerate the full 500 mg q. evening because it was causing her side effects at night.  She has most blood sugars have been running around the 160s.  No sugars identified lower than 160.  Her high has been 239.  Average fasting blood sugars have been 200 in the morning. She stopped drinking pepsi. Denies any polydipsia, polyuria, change in vision or dizziness.  Last eye exam: Had done Last foot exam: UTD Last A1c:  Lab Results  Component Value Date   HGBA1C 6.4 06/09/2020   Nephropathy screen indicated?: needs Last flu, zoster and/or pneumovax:  Immunization History  Administered Date(s) Administered  . Fluad Quad(high Dose 65+) 09/07/2019  . Influenza,inj,Quad PF,6+ Mos 10/29/2016, 01/03/2018, 10/08/2018  . Moderna SARS-COVID-2 Vaccination 02/25/2020, 03/29/2020  . Pneumococcal Conjugate-13 08/02/2017  . Pneumococcal Polysaccharide-23 08/04/2018    ROS: No chest pain, shortness of breath, lower extremity edema.  2.  Vaginal burning Patient reports that she continues to have vaginal burning and pain which initially was internal but now seems to be on the external aspect of the vagina.  She would like to have a referral to OB/GYN for further evaluation of this if symptoms are refractory to Diflucan and Estrace vaginally.  ROS: Per HPI  Allergies  Allergen Reactions  . Glipizide Itching  . Pravachol [Pravastatin Sodium] Other (See Comments)    Stomach pain and constipation    Past Medical History:  Diagnosis Date  . Anxiety   . Arthritis    of the neck  . Colon polyps   . Esophageal stricture   .  Essential hypertension 01/05/2020  . GERD (gastroesophageal reflux disease)    Hiatal Hernia  . History of uterine cancer   . Hyperlipidemia   . Hypertension   . Osteoporosis   . Pacemaker   . Symptomatic bradycardia     Current Outpatient Medications:  .  aspirin EC 81 MG EC tablet, Take 1 tablet (81 mg total) by mouth daily., Disp: 30 tablet, Rfl: 0 .  Blood Glucose Monitoring Suppl (ONE TOUCH ULTRA 2) w/Device KIT, 1 Device by Does not apply route daily., Disp: 1 each, Rfl: 0 .  BYSTOLIC 5 MG tablet, TAKE (1) TABLET TWICE A DAY., Disp: 60 tablet, Rfl: 11 .  calcium citrate-vitamin D (CITRACAL+D) 315-200 MG-UNIT per tablet, Take 1 tablet by mouth daily. , Disp: , Rfl:  .  estradiol (ESTRACE) 0.1 MG/GM vaginal cream, Place 1 Applicatorful vaginally at bedtime., Disp: , Rfl:  .  glucose blood test strip, Use to check BG up to qd, Disp: 100 each, Rfl: 2 .  Lactobacillus (PROBIOTIC ACIDOPHILUS PO), Take by mouth., Disp: , Rfl:  .  metFORMIN (GLUCOPHAGE) 500 MG tablet, Take 1 tablet each morning and 1/2 tablet each evening., Disp: 135 tablet, Rfl: 3 .  Multiple Vitamins-Minerals (MULTIVITAMIN WOMENS 50+ ADV PO), Take by mouth., Disp: , Rfl:  .  ONETOUCH DELICA LANCETS FINE MISC, Use to check BG up to qd, Disp: 100 each, Rfl: 2 .  pantoprazole (PROTONIX) 40 MG tablet, Take 1 tablet (40 mg total) by mouth daily., Disp: 90 tablet, Rfl:  3 .  rosuvastatin (CRESTOR) 5 MG tablet, Take 1 tablet (5 mg total) by mouth daily at 6 PM., Disp: 30 tablet, Rfl: 1 .  sertraline (ZOLOFT) 50 MG tablet, Take 1 tablet (50 mg total) by mouth daily., Disp: 90 tablet, Rfl: 3 Social History   Socioeconomic History  . Marital status: Divorced    Spouse name: Not on file  . Number of children: 1  . Years of education: GED  . Highest education level: GED or equivalent  Occupational History  . Occupation: retired    Comment: Engineer, manufacturing systems  Tobacco Use  . Smoking status: Never Smoker  . Smokeless tobacco: Never  Used  Vaping Use  . Vaping Use: Never used  Substance and Sexual Activity  . Alcohol use: No    Comment: may have a social drink rarely  . Drug use: No  . Sexual activity: Not Currently  Other Topics Concern  . Not on file  Social History Narrative  . Not on file   Social Determinants of Health   Financial Resource Strain: Low Risk   . Difficulty of Paying Living Expenses: Not very hard  Food Insecurity: No Food Insecurity  . Worried About Charity fundraiser in the Last Year: Never true  . Ran Out of Food in the Last Year: Never true  Transportation Needs: No Transportation Needs  . Lack of Transportation (Medical): No  . Lack of Transportation (Non-Medical): No  Physical Activity: Inactive  . Days of Exercise per Week: 0 days  . Minutes of Exercise per Session: 0 min  Stress: No Stress Concern Present  . Feeling of Stress : Not at all  Social Connections: Socially Integrated  . Frequency of Communication with Friends and Family: More than three times a week  . Frequency of Social Gatherings with Friends and Family: More than three times a week  . Attends Religious Services: More than 4 times per year  . Active Member of Clubs or Organizations: Yes  . Attends Archivist Meetings: More than 4 times per year  . Marital Status: Living with partner  Intimate Partner Violence: Not At Risk  . Fear of Current or Ex-Partner: No  . Emotionally Abused: No  . Physically Abused: No  . Sexually Abused: No   Family History  Problem Relation Age of Onset  . Heart disease Mother   . Heart attack Mother   . Heart disease Father   . Heart attack Father   . Cancer Sister        breast  . Alcohol abuse Brother   . COPD Sister   . Healthy Daughter     Objective: Office vital signs reviewed. BP 130/66   Pulse 65   Temp (!) 97.1 F (36.2 C)   Ht _0  (1.626 m)   Wt 155 lb 9.6 oz (70.6 kg)   SpO2 98%   BMI 26.71 kg/m   Physical Examination:  General: Awake,  alert, well nourished, No acute distress HEENT: Normal, sclera white, MMM Cardio: RRR, S1S2 heard, no murmurs Pulm: Normal work of breathing on room air, CTAB Extremities: warm, well perfused, No edema, cyanosis or clubbing; +2 pulses bilaterally  Assessment/ Plan: 69 y.o. female   1. Controlled type 2 diabetes mellitus with other specified complication, without long-term current use of insulin (HCC) Suppressing under control with A1c of 6.5.  Based on her fasting blood sugars measured at home I do not expect her to be at goal.  I think  it is appropriate for her to continue 500 mg each morning and 250 mg each evening.  We discussed that if her A1c is less than 6 at next visit we can get her back to 500 mg daily. - Bayer DCA Hb A1c Waived - Microalbumin / creatinine urine ratio  2. Hyperlipidemia associated with type 2 diabetes mellitus (HCC) continiue statin  3. Hypertension associated with diabetes ([place]) controlled  4. Gastroesophageal reflux disease without esophagitis Stable - pantoprazole (PROTONIX) 40 MG tablet; Take 1 tablet (40 mg total) by mouth daily.  Dispense: 90 tablet; Refill: 3  5. Vulvodynia Referral placed - Ambulatory referral to Obstetrics / Gynecology    Orders Placed This Encounter  Procedures  . Bayer DCA Hb A1c Waived  . Microalbumin / creatinine urine ratio  . Ambulatory referral to Obstetrics / Gynecology    Referral Priority:   Routine    Referral Type:   Consultation    Referral Reason:   Specialty Services Required    Requested Specialty:   Obstetrics and Gynecology    Number of Visits Requested:   1   Meds ordered this encounter  Medications  . metFORMIN (GLUCOPHAGE) 500 MG tablet    Sig: Take 1 tablet each morning and 1/2 tablet each evening.    Dispense:  135 tablet    Refill:  3  . pantoprazole (PROTONIX) 40 MG tablet    Sig: Take 1 tablet (40 mg total) by mouth daily.    Dispense:  90 tablet    Refill:  [place],  [place] [phone]

## 2020-06-09 NOTE — Patient Instructions (Addendum)
Your sugar is back at goal. A1c 6.4 today.  Ok to continue 1 tablet of metformin each morning and 1/2 tablet each evening.  See me back in 6 months unless you need me sooner.

## 2020-06-10 LAB — MICROALBUMIN / CREATININE URINE RATIO
Creatinine, Urine: 101.5 mg/dL
Microalb/Creat Ratio: 57 mg/g creat — ABNORMAL HIGH (ref 0–29)
Microalbumin, Urine: 57.4 ug/mL

## 2020-06-23 ENCOUNTER — Encounter: Payer: Self-pay | Admitting: Obstetrics

## 2020-06-23 ENCOUNTER — Other Ambulatory Visit (HOSPITAL_COMMUNITY)
Admission: RE | Admit: 2020-06-23 | Discharge: 2020-06-23 | Disposition: A | Discharge status: Home / Self Care | Payer: PPO | Source: Ambulatory Visit | Attending: Obstetrics | Admitting: Obstetrics

## 2020-06-23 ENCOUNTER — Ambulatory Visit (INDEPENDENT_AMBULATORY_CARE_PROVIDER_SITE_OTHER): Payer: PPO | Admitting: Obstetrics

## 2020-06-23 ENCOUNTER — Other Ambulatory Visit: Payer: Self-pay

## 2020-06-23 VITALS — BP 137/76 | HR 65 | Ht 64.0 in | Wt 155.0 lb

## 2020-06-23 DIAGNOSIS — Z01419 Encounter for gynecological examination (general) (routine) without abnormal findings: Secondary | ICD-10-CM | POA: Diagnosis not present

## 2020-06-23 DIAGNOSIS — N898 Other specified noninflammatory disorders of vagina: Secondary | ICD-10-CM | POA: Diagnosis not present

## 2020-06-23 MED ORDER — TERCONAZOLE 0.4 % VA CREA: 1.0000 | TOPICAL_CREAM | Freq: Every day | VAGINAL | 0 refills | Status: DC

## 2020-06-23 NOTE — Progress Notes (Signed)
Pt states she is having vaginal itching/burning x 1 year.   Pt states she has had increase in vulvar redness.  Pt does complain of discharge.   Last mammo 09/2019- normal.

## 2020-06-23 NOTE — Progress Notes (Signed)
Subjective:        [name] is a 69 y.o. female here for a routine exam.  Current complaints: Vaginal discharge and itching.    Personal health questionnaire:  Is patient Ashkenazi Jewish, have a family history of breast and/or ovarian cancer: no Is there a family history of uterine cancer diagnosed at age < 65, gastrointestinal cancer, urinary tract cancer, family member who is a Field seismologist syndrome-associated carrier: no Is the patient overweight and hypertensive, family history of diabetes, personal history of gestational diabetes, preeclampsia or PCOS: no Is patient over 65, have PCOS,  family history of premature CHD under age 12, diabetes, smoke, have hypertension or peripheral artery disease:  yes At any time, has a partner hit, kicked or otherwise hurt or frightened you?: no Over the past 2 weeks, have you felt down, depressed or hopeless?: no Over the past 2 weeks, have you felt little interest or pleasure in doing things?:no   Gynecologic History No LMP recorded. Patient has had a hysterectomy. Contraception: post menopausal status Last Pap: 2014. Results were: normal Last mammogram: 2020. Results were: normal  Obstetric History OB History  Gravida Para Term Preterm AB Living  '1 1 1        ' SAB TAB Ectopic Multiple Live Births               # Outcome Date GA Lbr Len/2nd Weight Sex Delivery Anes PTL Lv  1 Term 03/22/70            Past Medical History:  Diagnosis Date  . Anxiety   . Arthritis    of the neck  . Colon polyps   . Diabetes mellitus without complication ([place])   . Esophageal stricture   . Essential hypertension 01/05/2020  . GERD (gastroesophageal reflux disease)    Hiatal Hernia  . History of uterine cancer   . Hyperlipidemia   . Hypertension   . Osteoporosis   . Pacemaker   . Symptomatic bradycardia     Past Surgical History:  Procedure Laterality Date  . APPENDECTOMY    . CHOLECYSTECTOMY    . LAPAROSCOPIC TOTAL HYSTERECTOMY    . LYMPH  NODE DISSECTION     bilateral pelvic and right-sided periaortic  . PACEMAKER GENERATOR CHANGE N/A 05/21/2014   Procedure: PACEMAKER GENERATOR CHANGE;  Surgeon: Evans Lance, MD;  Location: [facility] CATH LAB;  Service: Cardiovascular;  Laterality: N/A;  . status post pacemaker     metronic [id number]  . TRANSTHORACIC ECHOCARDIOGRAM  08/30/04  . TUBAL LIGATION       Current Outpatient Medications:  .  aspirin EC 81 MG EC tablet, Take 1 tablet (81 mg total) by mouth daily., Disp: 30 tablet, Rfl: 0 .  calcium citrate-vitamin D (CITRACAL+D) 315-200 MG-UNIT per tablet, Take 1 tablet by mouth daily. , Disp: , Rfl:  .  COLLAGEN PO, Take by mouth., Disp: , Rfl:  .  estradiol (ESTRACE) 0.1 MG/GM vaginal cream, Place 1 Applicatorful vaginally at bedtime., Disp: , Rfl:  .  Lactobacillus (PROBIOTIC ACIDOPHILUS PO), Take by mouth., Disp: , Rfl:  .  metFORMIN (GLUCOPHAGE) 500 MG tablet, Take 1 tablet each morning and 1/2 tablet each evening., Disp: 135 tablet, Rfl: 3 .  Multiple Vitamins-Minerals (MULTIVITAMIN WOMENS 50+ ADV PO), Take by mouth., Disp: , Rfl:  .  pantoprazole (PROTONIX) 40 MG tablet, Take 1 tablet (40 mg total) by mouth daily., Disp: 90 tablet, Rfl: 3 .  sertraline (ZOLOFT) 50 MG tablet, Take 1 tablet (  50 mg total) by mouth daily., Disp: 90 tablet, Rfl: 3 .  Blood Glucose Monitoring Suppl (ONE TOUCH ULTRA 2) w/Device KIT, 1 Device by Does not apply route daily., Disp: 1 each, Rfl: 0 .  BYSTOLIC 5 MG tablet, TAKE (1) TABLET TWICE A DAY., Disp: 60 tablet, Rfl: 11 .  glucose blood test strip, Use to check BG up to qd, Disp: 100 each, Rfl: 2 .  ONETOUCH DELICA LANCETS FINE MISC, Use to check BG up to qd, Disp: 100 each, Rfl: 2 .  rosuvastatin (CRESTOR) 5 MG tablet, Take 1 tablet (5 mg total) by mouth daily at 6 PM. (Patient not taking: Reported on 06/23/2020), Disp: 30 tablet, Rfl: 1 .  terconazole (TERAZOL 7) 0.4 % vaginal cream, Place 1 applicator vaginally at bedtime., Disp: 45 g, Rfl: 0 Allergies   Allergen Reactions  . Glipizide Itching  . Pravachol [Pravastatin Sodium] Other (See Comments)    Stomach pain and constipation     Social History   Tobacco Use  . Smoking status: Never Smoker  . Smokeless tobacco: Never Used  Substance Use Topics  . Alcohol use: No    Comment: may have a social drink rarely    Family History  Problem Relation Age of Onset  . Heart disease Mother   . Heart attack Mother   . Heart disease Father   . Heart attack Father   . Cancer Sister        breast  . Alcohol abuse Brother   . COPD Sister   . Healthy Daughter       Review of Systems  Constitutional: negative for fatigue and weight loss Respiratory: negative for cough and wheezing Cardiovascular: negative for chest pain, fatigue and palpitations Gastrointestinal: negative for abdominal pain and change in bowel habits Musculoskeletal:negative for myalgias Neurological: negative for gait problems and tremors Behavioral/Psych: negative for abusive relationship, depression Endocrine: negative for temperature intolerance    Genitourinary:negative for abnormal menstrual periods, genital lesions, hot flashes, sexual problems and vaginal discharge Integument/breast: negative for breast lump, breast tenderness, nipple discharge and skin lesion(s)    Objective:       BP 137/76   Pulse 65   Ht '5\' 4"'  (1.626 m)   Wt 155 lb (70.3 kg)   BMI 26.61 kg/m  General:   alert  Skin:   no rash or abnormalities  Lungs:   clear to auscultation bilaterally  Heart:   regular rate and rhythm, S1, S2 normal, no murmur, click, rub or gallop  Breasts:   normal without suspicious masses, skin or nipple changes or axillary nodes  Abdomen:  normal findings: no organomegaly, soft, non-tender and no hernia  Pelvis:  External genitalia: normal general appearance Urinary system: urethral meatus normal and bladder without fullness, nontender Vaginal: normal without tenderness, induration or masses Cervix:  absent Adnexa: not felt Uterus: absent   Lab Review Urine pregnancy test Labs reviewed yes Radiologic studies reviewed yes  50% of 25 min visit spent on counseling and coordination of care.   Assessment:     1. Encounter for routine gynecological examination with Papanicolaou smear of cervix  2. Vaginal discharge Rx: - Cervicovaginal ancillary only( [place])  3. Vaginal irritation Rx: - terconazole (TERAZOL 7) 0.4 % vaginal cream; Place 1 applicator vaginally at bedtime.  Dispense: 45 g; Refill: 0    Plan:    Education reviewed: calcium supplements, depression evaluation, low fat, low cholesterol diet, safe sex/STD prevention, self breast exams, skin cancer screening and  weight bearing exercise. Follow up in: 2 years.   Meds ordered this encounter  Medications  . terconazole (TERAZOL 7) 0.4 % vaginal cream    Sig: Place 1 applicator vaginally at bedtime.    Dispense:  45 g    Refill:  0     Shelly Bombard, MD 06/23/2020 10:55 AM

## 2020-06-24 LAB — CERVICOVAGINAL ANCILLARY ONLY
Bacterial Vaginitis (gardnerella): NEGATIVE
Candida Glabrata: NEGATIVE
Candida Vaginitis: NEGATIVE
Comment: NEGATIVE
Comment: NEGATIVE
Comment: NEGATIVE
Comment: NEGATIVE
Trichomonas: NEGATIVE

## 2020-07-08 ENCOUNTER — Ambulatory Visit: Payer: PPO | Admitting: Nurse Practitioner

## 2020-08-06 ENCOUNTER — Other Ambulatory Visit: Payer: Self-pay

## 2020-08-06 ENCOUNTER — Emergency Department (HOSPITAL_COMMUNITY): Payer: PPO

## 2020-08-06 ENCOUNTER — Encounter (HOSPITAL_COMMUNITY): Payer: Self-pay | Admitting: Emergency Medicine

## 2020-08-06 ENCOUNTER — Emergency Department (HOSPITAL_COMMUNITY)
Admission: EM | Admit: 2020-08-06 | Discharge: 2020-08-06 | Disposition: A | Discharge status: Home / Self Care | Payer: PPO | Attending: Emergency Medicine | Admitting: Emergency Medicine

## 2020-08-06 DIAGNOSIS — I639 Cerebral infarction, unspecified: Secondary | ICD-10-CM | POA: Diagnosis not present

## 2020-08-06 DIAGNOSIS — R29810 Facial weakness: Secondary | ICD-10-CM | POA: Insufficient documentation

## 2020-08-06 DIAGNOSIS — I517 Cardiomegaly: Secondary | ICD-10-CM | POA: Insufficient documentation

## 2020-08-06 DIAGNOSIS — I6930 Unspecified sequelae of cerebral infarction: Secondary | ICD-10-CM | POA: Diagnosis not present

## 2020-08-06 DIAGNOSIS — Z5321 Procedure and treatment not carried out due to patient leaving prior to being seen by health care provider: Secondary | ICD-10-CM | POA: Diagnosis not present

## 2020-08-06 DIAGNOSIS — R519 Headache, unspecified: Secondary | ICD-10-CM | POA: Insufficient documentation

## 2020-08-06 LAB — COMPREHENSIVE METABOLIC PANEL
ALT: 18 U/L (ref 0–44)
AST: 17 U/L (ref 15–41)
Albumin: 3.6 g/dL (ref 3.5–5.0)
Alkaline Phosphatase: 61 U/L (ref 38–126)
Anion gap: 10 (ref 5–15)
BUN: 15 mg/dL (ref 8–23)
CO2: 26 mmol/L (ref 22–32)
Calcium: 9.3 mg/dL (ref 8.9–10.3)
Chloride: 103 mmol/L (ref 98–111)
Creatinine, Ser: 0.77 mg/dL (ref 0.44–1.00)
GFR calc Af Amer: >60 mL/min (ref >60)
GFR calc non Af Amer: >60 mL/min (ref >60)
Glucose, Bld: 197 mg/dL — ABNORMAL HIGH (ref 70–99)
Potassium: 4.1 mmol/L (ref 3.5–5.1)
Sodium: 139 mmol/L (ref 135–145)
Total Bilirubin: 0.5 mg/dL (ref 0.3–1.2)
Total Protein: 6.8 g/dL (ref 6.5–8.1)

## 2020-08-06 LAB — APTT: aPTT: 26 seconds (ref 24–36)

## 2020-08-06 LAB — PROTIME-INR
INR: 1 (ref 0.8–1.2)
Prothrombin Time: 12.8 seconds (ref 11.4–15.2)

## 2020-08-06 LAB — CBC
HCT: 41.6 % (ref 36.0–46.0)
Hemoglobin: 13.2 g/dL (ref 12.0–15.0)
MCH: 30 pg (ref 26.0–34.0)
MCHC: 31.7 g/dL (ref 30.0–36.0)
MCV: 94.5 fL (ref 80.0–100.0)
Platelets: 196 10*3/uL (ref 150–400)
RBC: 4.4 MIL/uL (ref 3.87–5.11)
RDW: 13 % (ref 11.5–15.5)
WBC: 7.4 10*3/uL (ref 4.0–10.5)
nRBC: 0 % (ref 0.0–0.2)

## 2020-08-06 LAB — DIFFERENTIAL
Abs Immature Granulocytes: 0.02 10*3/uL (ref 0.00–0.07)
Basophils Absolute: 0 10*3/uL (ref 0.0–0.1)
Basophils Relative: 0 %
Eosinophils Absolute: 0.1 10*3/uL (ref 0.0–0.5)
Eosinophils Relative: 2 %
Immature Granulocytes: 0 %
Lymphocytes Relative: 27 %
Lymphs Abs: 2 10*3/uL (ref 0.7–4.0)
Monocytes Absolute: 0.4 10*3/uL (ref 0.1–1.0)
Monocytes Relative: 6 %
Neutro Abs: 4.7 10*3/uL (ref 1.7–7.7)
Neutrophils Relative %: 65 %

## 2020-08-06 MED ORDER — SODIUM CHLORIDE 0.9% FLUSH: 3.0000 mL | Freq: Once | INTRAVENOUS | Status: DC

## 2020-08-06 NOTE — ED Triage Notes (Signed)
Reports L sided facial droop since last night with L sided headache.  No extremity weakness/numbness.  VAN negative.  No arm drift.

## 2020-08-06 NOTE — ED Notes (Signed)
Pt LWBS. Pt stated she would look at her Mychart later and contact her PCP. Pt was encouraged to stay but decided to leave.

## 2020-08-08 ENCOUNTER — Ambulatory Visit (INDEPENDENT_AMBULATORY_CARE_PROVIDER_SITE_OTHER): Payer: PPO | Admitting: Family

## 2020-08-08 ENCOUNTER — Encounter: Payer: Self-pay | Admitting: Family

## 2020-08-08 DIAGNOSIS — R200 Anesthesia of skin: Secondary | ICD-10-CM | POA: Diagnosis not present

## 2020-08-08 DIAGNOSIS — G510 Bell's palsy: Secondary | ICD-10-CM

## 2020-08-08 DIAGNOSIS — Z09 Encounter for follow-up examination after completed treatment for conditions other than malignant neoplasm: Secondary | ICD-10-CM | POA: Diagnosis not present

## 2020-08-08 MED ORDER — PREDNISONE 20 MG PO TABS: ORAL_TABLET | ORAL | 0 refills | Status: DC

## 2020-08-08 NOTE — Progress Notes (Signed)
Virtual Visit via telephone Note Due to COVID-19 pandemic this visit was conducted virtually. This visit type was conducted due to national recommendations for restrictions regarding the COVID-19 Pandemic (e.g. social distancing, sheltering in place) in an effort to limit this patient's exposure and mitigate transmission in our community. All issues noted in this document were discussed and addressed.  A physical exam was not performed with this format.  I connected with [name] on 08/08/20 at 2:31 pm  by telephone and verified that I am speaking with the correct person using two identifiers. [name] is currently located at home and no one is currently with her during visit. The provider, Evelina Dun, FNP is located in their office at time of visit.  I discussed the limitations, risks, security and privacy concerns of performing an evaluation and management service by telephone and the availability of in person appointments. I also discussed with the patient that there may be a patient responsible charge related to this service. The patient expressed understanding and agreed to proceed.   History and Present Illness:  HPI PT calls the office today with complaints of left eye pain and left sided numbess that started two days ago. She states she woke up Saturday with these symptoms and went to the ED. She had a CT scan that showed "1. 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 2. No acute intracranial process." She had a normal CMP, CBC, INR, APTT.  She reports she left before the doctor could see her because it was going to be another 4 hour wait.   She had a CVA 01/2019. Denies any changes speech, gait, memory, vision. She states she can not shut her left eye without   She has drooping of left eyelid and mouth.   Review of Systems  All other systems reviewed and are negative.      Observations/Objective: No SOB or distress noted   Assessment and Plan: 1.  Hospital discharge follow-up - Ambulatory referral to Neurology  2. Facial numbness - Ambulatory referral to Neurology - predniSONE (DELTASONE) 20 MG tablet; 60 mg daily for 5 days, then 50 mg day 6, 40 mg day 7, 30 mg day 8, 20 mg day 9, 10 mg day 10  Dispense: 23 tablet; Refill: 0  3. Bell palsy - Ambulatory referral to Neurology - predniSONE (DELTASONE) 20 MG tablet; 60 mg daily for 5 days, then 50 mg day 6, 40 mg day 7, 30 mg day 8, 20 mg day 9, 10 mg day 10  Dispense: 23 tablet; Refill: 0  Will start Prednisone, strict low carb diet Needs to follow up with Neurologists  Start eye drops QID IF any changes in vision, gait, or speech go to ED   I discussed the assessment and treatment plan with the patient. The patient was provided an opportunity to ask questions and all were answered. The patient agreed with the plan and demonstrated an understanding of the instructions.   The patient was advised to call back or seek an in-person evaluation if the symptoms worsen or if the condition fails to improve as anticipated.  The above assessment and management plan was discussed with the patient. The patient verbalized understanding of and has agreed to the management plan. Patient is aware to call the clinic if symptoms persist or worsen. Patient is aware when to return to the clinic for a follow-up visit. Patient educated on when it is appropriate to go to the emergency department.  Time call ended:  2:54 pm   I provided 23 minutes of non-face-to-face time during this encounter.    Evelina Dun, FNP

## 2020-08-18 ENCOUNTER — Ambulatory Visit (INDEPENDENT_AMBULATORY_CARE_PROVIDER_SITE_OTHER): Payer: PPO | Admitting: *Deleted

## 2020-08-18 DIAGNOSIS — R001 Bradycardia, unspecified: Secondary | ICD-10-CM | POA: Diagnosis not present

## 2020-08-19 LAB — CUP PACEART REMOTE DEVICE CHECK
Battery Impedance: 420 Ohm
Battery Remaining Longevity: 108 mo
Battery Voltage: 2.78 V
Brady Statistic AP VP Percent: 0 %
Brady Statistic AP VS Percent: 0 %
Brady Statistic AS VP Percent: 0 %
Brady Statistic AS VS Percent: 100 %
Date Time Interrogation Session: 20210902110902
Implantable Lead Implant Date: 20050914
Implantable Lead Implant Date: 20050914
Implantable Lead Location: 753859
Implantable Lead Location: 753860
Implantable Lead Model: 5076
Implantable Lead Model: 5076
Implantable Pulse Generator Implant Date: 20150605
Lead Channel Impedance Value: 533 Ohm
Lead Channel Impedance Value: 555 Ohm
Lead Channel Pacing Threshold Amplitude: 0.5 V
Lead Channel Pacing Threshold Amplitude: 0.75 V
Lead Channel Pacing Threshold Pulse Width: 0.4 ms
Lead Channel Pacing Threshold Pulse Width: 0.4 ms
Lead Channel Setting Pacing Amplitude: 2 V
Lead Channel Setting Pacing Amplitude: 2.5 V
Lead Channel Setting Pacing Pulse Width: 0.46 ms
Lead Channel Setting Sensing Sensitivity: 5.6 mV

## 2020-08-23 NOTE — Progress Notes (Signed)
Remote pacemaker transmission.   

## 2020-09-09 ENCOUNTER — Other Ambulatory Visit: Payer: Self-pay

## 2020-09-09 ENCOUNTER — Other Ambulatory Visit: Payer: PPO

## 2020-09-27 ENCOUNTER — Encounter: Payer: Self-pay | Admitting: Neurology

## 2020-09-27 ENCOUNTER — Ambulatory Visit: Payer: PPO | Admitting: Neurology

## 2020-09-27 VITALS — BP 100/68 | HR 86 | Ht 64.0 in | Wt 157.0 lb

## 2020-09-27 DIAGNOSIS — G510 Bell's palsy: Secondary | ICD-10-CM

## 2020-09-27 NOTE — Progress Notes (Signed)
Subjective:    Patient ID: [name] is a 69 y.o. female.  HPI     Interim history:   Dear [name],   I saw your patient, [name], upon your kind request, in my Neurologic clinic today for consultation after recent Bell's palsy.  The patient is unaccompanied today.  She is a 69 year old right-handed woman with an underlying medical history of occipital stroke in 2020, bradycardia with status post pacemaker placement, arthritis, reflux disease, anxiety, uterine cancer, hypertension, hyperlipidemia, and mildly overweight state, who presented to the emergency room in August 2021 with left-sided facial droop.  She had a head CT without contrast on 08/06/2020 and I reviewed the results: IMPRESSION: 1. 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 2. No acute intracranial process.  She did not stay to be seen in the emergency room.  I reviewed your office note from 08/08/20. She was treated with prednisone for about 10 days and advised to use eye drops qid.   I have seen her last year at the request of the stroke nurse practitioner for evaluation of her sleep disorder.  I suggested we proceed with a sleep study but she declined at the time and wanted to think about it.  She feels better with regards to her facial droop.  She finished the prednisone.  She denies any difficulty with her left eye.  She does have itching in her eyes at times and uses eyedrops for that reason.  She typically goes for a yearly eye examination with an optometrist.  She does have prescription eyeglasses.  Sometimes she wakes up with a headache above the right eye.  Sometimes she has headaches in the back of her head.  She denies any sudden onset of one-sided weakness or numbness or tingling or droopy face or slurring of speech otherwise.  She admits that she does not always hydrate very well.  She estimates that she drinks about 1 to bottles of water per day, coffee in the morning and 1 soda per day on  average.  She is willing to think about a sleep study but declines a sleep study today.   The patient's allergies, current medications, family history, past medical history, past social history, past surgical history and problem list were reviewed and updated as appropriate.   Previously:   08/12/19:  69 year old right-handed woman with an underlying medical history of bradycardia with status post pacemaker placement, history of left PCA stroke in February 2020, arthritis, anxiety, reflux disease, esophageal stricture, hiatal hernia, history of uterine cancer, hypertension, hyperlipidemia, osteoporosis, overweight state, who reports snoring and some sleep disruption from vivid dreams and sleep talking per boyfriend's report.  I reviewed your office note from 07/27/2019.  Her Epworth sleepiness score is 6 out of 24, fatigue severity score is 37 out of 63.  She has recuperated well from her stroke, no residual symptoms, still has some occasional visual symptoms but otherwise feels back to baseline.  She does not have a whole lot of sleep-related complaints, but has had the occasional morning headache and has nocturia about once per average night.  She is not aware of any family history of OSA.  Her boyfriend reports that she may mumble in her sleep and cry out sometimes.  She does recall having had some bad dreams and vivid dreams.  This has been going on for the past couple of years.  She is a non-smoker and does not utilize alcohol and drinks caffeine on limitation, 1 cup  of coffee in the morning and one soda per day.  Bedtime is late, around midnight and rise time around 830.  She is retired from working at a [employer], materials.  She has 1 grown daughter.  She lives with her boyfriend, she also tends to nap in the afternoons, sometimes 1 and sometimes 2 hours, typically somewhere between 130 and 3 PM.  She had a tonsillectomy as a child.  She has no difficulty falling asleep or staying asleep.  Her Past Medical  History Is Significant For: Past Medical History:  Diagnosis Date  . Anxiety   . Arthritis    of the neck  . Colon polyps   . Diabetes mellitus without complication ([place])   . Esophageal stricture   . Essential hypertension 01/05/2020  . GERD (gastroesophageal reflux disease)    Hiatal Hernia  . History of uterine cancer   . Hyperlipidemia   . Hypertension   . Osteoporosis   . Pacemaker   . Symptomatic bradycardia     Her Past Surgical History Is Significant For: Past Surgical History:  Procedure Laterality Date  . APPENDECTOMY    . CHOLECYSTECTOMY    . LAPAROSCOPIC TOTAL HYSTERECTOMY    . LYMPH NODE DISSECTION     bilateral pelvic and right-sided periaortic  . PACEMAKER GENERATOR CHANGE N/A 05/21/2014   Procedure: PACEMAKER GENERATOR CHANGE;  Surgeon: Evans Lance, MD;  Location: [facility] CATH LAB;  Service: Cardiovascular;  Laterality: N/A;  . status post pacemaker     metronic [id number]  . TRANSTHORACIC ECHOCARDIOGRAM  08/30/04  . TUBAL LIGATION      Her Family History Is Significant For: Family History  Problem Relation Age of Onset  . Heart disease Mother   . Heart attack Mother   . Heart disease Father   . Heart attack Father   . Cancer Sister        breast  . Alcohol abuse Brother   . COPD Sister   . Healthy Daughter     Her Social History Is Significant For: Social History   Socioeconomic History  . Marital status: Divorced    Spouse name: Not on file  . Number of children: 1  . Years of education: GED  . Highest education level: GED or equivalent  Occupational History  . Occupation: retired    Comment: Engineer, manufacturing systems  Tobacco Use  . Smoking status: Never Smoker  . Smokeless tobacco: Never Used  Vaping Use  . Vaping Use: Never used  Substance and Sexual Activity  . Alcohol use: No    Comment: may have a social drink rarely  . Drug use: No  . Sexual activity: Not Currently  Other Topics Concern  . Not on file  Social History Narrative  . Not  on file   Social Determinants of Health   Financial Resource Strain: [name] Risk   . Difficulty of Paying Living Expenses: Not very hard  Food Insecurity: No Food Insecurity  . Worried About Charity fundraiser in the Last Year: Never true  . Ran Out of Food in the Last Year: Never true  Transportation Needs: No Transportation Needs  . Lack of Transportation (Medical): No  . Lack of Transportation (Non-Medical): No  Physical Activity: Inactive  . Days of Exercise per Week: 0 days  . Minutes of Exercise per Session: 0 min  Stress: No Stress Concern Present  . Feeling of Stress : Not at all  Social Connections: Socially Integrated  . Frequency of Communication  with Friends and Family: More than three times a week  . Frequency of Social Gatherings with Friends and Family: More than three times a week  . Attends Religious Services: More than 4 times per year  . Active Member of Clubs or Organizations: Yes  . Attends Archivist Meetings: More than 4 times per year  . Marital Status: Living with partner    Her Allergies Are:  Allergies  Allergen Reactions  . Glipizide Itching  . Pravachol [Pravastatin Sodium] Other (See Comments)    Stomach pain and constipation   :   Her Current Medications Are:  Outpatient Encounter Medications as of 09/27/2020  Medication Sig  . aspirin EC 81 MG EC tablet Take 1 tablet (81 mg total) by mouth daily.  . Blood Glucose Monitoring Suppl (ONE TOUCH ULTRA 2) w/Device KIT 1 Device by Does not apply route daily.  Marland Kitchen BYSTOLIC 5 MG tablet TAKE (1) TABLET TWICE A DAY.  . calcium citrate-vitamin D (CITRACAL+D) 315-200 MG-UNIT per tablet Take 1 tablet by mouth daily.   Marland Kitchen glucose blood test strip Use to check BG up to qd  . metFORMIN (GLUCOPHAGE) 500 MG tablet Take 1 tablet each morning and 1/2 tablet each evening.  Glory Rosebush DELICA LANCETS FINE MISC Use to check BG up to qd  . pantoprazole (PROTONIX) 40 MG tablet Take 1 tablet (40 mg total) by  mouth daily.  . sertraline (ZOLOFT) 50 MG tablet Take 1 tablet (50 mg total) by mouth daily.  . [DISCONTINUED] COLLAGEN PO Take by mouth.  . [DISCONTINUED] estradiol (ESTRACE) 0.1 MG/GM vaginal cream Place 1 Applicatorful vaginally at bedtime.  . [DISCONTINUED] Lactobacillus (PROBIOTIC ACIDOPHILUS PO) Take by mouth.  . [DISCONTINUED] metoprolol succinate (TOPROL-XL) 25 MG 24 hr tablet Take 1 tablet (25 mg total) by mouth daily.  . [DISCONTINUED] Multiple Vitamins-Minerals (MULTIVITAMIN WOMENS 50+ ADV PO) Take by mouth.  . [DISCONTINUED] predniSONE (DELTASONE) 20 MG tablet 60 mg daily for 5 days, then 50 mg day 6, 40 mg day 7, 30 mg day 8, 20 mg day 9, 10 mg day 10  . [DISCONTINUED] rosuvastatin (CRESTOR) 5 MG tablet Take 1 tablet (5 mg total) by mouth daily at 6 PM. (Patient not taking: Reported on 06/23/2020)  . [DISCONTINUED] terconazole (TERAZOL 7) 0.4 % vaginal cream Place 1 applicator vaginally at bedtime.   No facility-administered encounter medications on file as of 09/27/2020.  :  Review of Systems:  Out of a complete 14 point review of systems, all are reviewed and negative with the exception of these symptoms as listed below: Review of Systems  Neurological:       Pt presents today to discuss her Bell's Palsy. Pt's symptoms have resolved.    Objective:  Neurological Exam  Physical Exam Physical Examination:   Vitals:   09/27/20 1355  BP: 100/68  Pulse: 86   General Examination: The patient is a very pleasant 69 y.o. female in no acute distress. She appears well-developed and well-nourished and well groomed.   HEENT: Normocephalic, atraumatic, pupils are equal, round and reactive to light, corrective eyeglasses. Extraocular tracking is good without limitation to gaze excursion or nystagmus noted. Hearing is grossly intact. Face is symmetric with normal facial animation and no obvious facial asymmetry.  She has no difficulty closing her eyes, no Bell's phenomenon.  Speech is  clear with no dysarthria noted. There is no hypophonia. There is no lip, neck/head, jaw or voice tremor. Neck is supple with full range of passive and  active motion. Oropharynx exam reveals: moderate mouth dryness, adequate dental hygiene and mild airway crowding.  Tongue protrudes centrally and palate elevates symmetrically.  Chest: Clear to auscultation without wheezing, rhonchi or crackles noted.  Heart: S1+S2+0, regular and normal without murmurs, rubs or gallops noted.   Abdomen: Soft, non-tender and non-distended.  Extremities: There is no pitting edema in the distal lower extremities bilaterally.  Skin: Warm and dry without trophic changes noted.  Musculoskeletal: exam reveals no obvious joint deformities, tenderness or joint swelling or erythema.   Neurologically:  Mental status: The patient is awake, alert and oriented in all 4 spheres. Her immediate and remote memory, attention, language skills and fund of knowledge are appropriate. There is no evidence of aphasia, agnosia, apraxia or anomia. Speech is clear with normal prosody and enunciation. Thought process is linear. Mood is normal and affect is normal.  Cranial nerves II - XII are as described above under HEENT exam.  Motor exam: Normal bulk, strength and tone is noted. There is no tremor. Reflexes are 1+, toes downgoing. Fine motor skills and coordination: grossly intact.  Cerebellar testing: No dysmetria or intention tremor on finger to nose testing. There is no truncal or gait ataxia.  Sensory exam: intact to light touch.  Gait, station and balance: She stands easily. No veering to one side is noted. No leaning to one side is noted. Posture is age-appropriate and stance is narrow based. Gait shows normal stride length and normal pace. No problems turning are noted.  Tandem walk is slightly challenging for her.  Assessment and Plan:  In summary, [name] is a very pleasant 69 year old female with an underlying  medical history of bradycardia with status post pacemaker placement, history of left PCA stroke in February 2020, arthritis, anxiety, reflux disease, esophageal stricture, hiatal hernia, history of uterine cancer, hypertension, hyperlipidemia, osteoporosis, overweight state, who presents for evaluation of her transient left facial palsy, likely left-sided Bell's palsy in August 2021.  She finished a 10-day course of oral prednisone.  She had a head CT without contrast on 08/06/2020 which did not show any acute findings, her old stroke was redemonstrated.  I had seen her last year for sleep evaluation at the request of the stroke team.  She does report occasional headaches.  She is advised regarding obstructive sleep apnea and increased risk for stroke and increased risk for recurrent headaches.  She again declines a sleep study at this time.  She has improved with regards to her Bell's palsy and has no obvious weakness or residual symptoms today.  Nevertheless, she is advised to seek evaluation through ophthalmology if she has not established through care through an ophthalmologist.  Her neurological exam is nonfocal and she is reassured.  She is encouraged to call our office should she change her mind regarding pursuing a sleep study.  She is encouraged to stay better hydrated with water.  At this juncture, she is advised to follow-up as needed.  If she has any questions or concerns regarding her stroke and stroke management, she is encouraged to make an appointment with the stroke nurse practitioner or Dr. Leonie Man.  I answered all her questions today and she was in agreement. Thank you very much for allowing me to participate in the care of this nice patient. If I can be of any further assistance to you please do not hesitate to talk to me.   [name], MD, PhD

## 2020-09-27 NOTE — Patient Instructions (Signed)
I am glad to hear that you are feeling better.  Currently, there is no evidence of facial weakness.  Your recent head CT from 08/06/2020 did not show any acute findings or new stroke, did show your old stroke.  Since you have experienced recurrent headaches, I recommend sleep study evaluation as we discussed last year.  If you change your mind regarding a sleep study, I would be happy to order a home sleep test or a laboratory sleep study.  Please make an appointment for an eye examination.  You may benefit from seeing an actual ophthalmologist since you do have diabetes.   I would be happy to see you back as needed. If you have symptoms regarding your stroke, you can make an appointment with Janett Billow, NP or Dr. Leonie Man, our stroke specialist. At this juncture, there is no evidence of a new stroke, thankfully.

## 2020-10-28 ENCOUNTER — Other Ambulatory Visit: Payer: Self-pay | Admitting: Family Medicine

## 2020-10-28 DIAGNOSIS — Z1231 Encounter for screening mammogram for malignant neoplasm of breast: Secondary | ICD-10-CM

## 2020-10-31 ENCOUNTER — Other Ambulatory Visit: Payer: Self-pay | Admitting: Family Medicine

## 2020-10-31 DIAGNOSIS — F419 Anxiety disorder, unspecified: Secondary | ICD-10-CM

## 2020-11-17 ENCOUNTER — Ambulatory Visit (INDEPENDENT_AMBULATORY_CARE_PROVIDER_SITE_OTHER): Payer: PPO

## 2020-11-17 DIAGNOSIS — R001 Bradycardia, unspecified: Secondary | ICD-10-CM

## 2020-11-17 LAB — CUP PACEART REMOTE DEVICE CHECK
Battery Impedance: 444 Ohm
Battery Remaining Longevity: 105 mo
Battery Voltage: 2.78 V
Brady Statistic AP VP Percent: 0 %
Brady Statistic AP VS Percent: 0 %
Brady Statistic AS VP Percent: 0 %
Brady Statistic AS VS Percent: 100 %
Date Time Interrogation Session: 20211202082746
Implantable Lead Implant Date: 20050914
Implantable Lead Implant Date: 20050914
Implantable Lead Location: 753859
Implantable Lead Location: 753860
Implantable Lead Model: 5076
Implantable Lead Model: 5076
Implantable Pulse Generator Implant Date: 20150605
Lead Channel Impedance Value: 488 Ohm
Lead Channel Impedance Value: 502 Ohm
Lead Channel Pacing Threshold Amplitude: 0.5 V
Lead Channel Pacing Threshold Amplitude: 0.75 V
Lead Channel Pacing Threshold Pulse Width: 0.4 ms
Lead Channel Pacing Threshold Pulse Width: 0.4 ms
Lead Channel Setting Pacing Amplitude: 2 V
Lead Channel Setting Pacing Amplitude: 2.5 V
Lead Channel Setting Pacing Pulse Width: 0.4 ms
Lead Channel Setting Sensing Sensitivity: 5.6 mV

## 2020-11-23 ENCOUNTER — Other Ambulatory Visit: Payer: Self-pay

## 2020-11-23 ENCOUNTER — Ambulatory Visit
Admission: RE | Admit: 2020-11-23 | Discharge: 2020-11-23 | Disposition: A | Discharge status: Home / Self Care | Payer: PPO | Source: Ambulatory Visit | Attending: Family Medicine | Admitting: Family Medicine

## 2020-11-23 DIAGNOSIS — Z1231 Encounter for screening mammogram for malignant neoplasm of breast: Secondary | ICD-10-CM

## 2020-11-29 NOTE — Progress Notes (Signed)
Remote pacemaker transmission.   

## 2020-12-05 ENCOUNTER — Other Ambulatory Visit: Payer: Self-pay | Admitting: Family Medicine

## 2020-12-05 ENCOUNTER — Ambulatory Visit: Payer: PPO | Admitting: Family Medicine

## 2020-12-05 DIAGNOSIS — R928 Other abnormal and inconclusive findings on diagnostic imaging of breast: Secondary | ICD-10-CM

## 2020-12-12 ENCOUNTER — Ambulatory Visit (INDEPENDENT_AMBULATORY_CARE_PROVIDER_SITE_OTHER): Payer: PPO | Admitting: Family Medicine

## 2020-12-12 ENCOUNTER — Other Ambulatory Visit: Payer: Self-pay

## 2020-12-12 VITALS — BP 138/74 | HR 68 | Temp 96.9°F | Ht 64.0 in | Wt 157.0 lb

## 2020-12-12 DIAGNOSIS — E785 Hyperlipidemia, unspecified: Secondary | ICD-10-CM | POA: Diagnosis not present

## 2020-12-12 DIAGNOSIS — E1159 Type 2 diabetes mellitus with other circulatory complications: Secondary | ICD-10-CM | POA: Diagnosis not present

## 2020-12-12 DIAGNOSIS — F419 Anxiety disorder, unspecified: Secondary | ICD-10-CM

## 2020-12-12 DIAGNOSIS — L603 Nail dystrophy: Secondary | ICD-10-CM

## 2020-12-12 DIAGNOSIS — M8589 Other specified disorders of bone density and structure, multiple sites: Secondary | ICD-10-CM | POA: Diagnosis not present

## 2020-12-12 DIAGNOSIS — E1169 Type 2 diabetes mellitus with other specified complication: Secondary | ICD-10-CM

## 2020-12-12 DIAGNOSIS — I152 Hypertension secondary to endocrine disorders: Secondary | ICD-10-CM | POA: Diagnosis not present

## 2020-12-12 LAB — BAYER DCA HB A1C WAIVED: HB A1C (BAYER DCA - WAIVED): 6.5 % (ref <7.0)

## 2020-12-12 MED ORDER — SERTRALINE HCL 50 MG PO TABS: 50.0000 mg | ORAL_TABLET | Freq: Every day | ORAL | 3 refills | Status: DC

## 2020-12-12 NOTE — Progress Notes (Signed)
Subjective: CC: DM PCP: Janora Norlander, DO GYB:WLSLHT [name] is a 69 y.o. female presenting to clinic today for:  1. Type 2 Diabetes with hypertension, hyperlipidemia:  She admits not to eating the most restrictive diet during the holidays but overall has been feeling well and has been compliant with her Metformin, Bystolic.  Last eye exam: needs Last foot exam: needs Last A1c:  Lab Results  Component Value Date   HGBA1C 6.4 06/09/2020   Nephropathy screen indicated?: UTD Last flu, zoster and/or pneumovax:  Immunization History  Administered Date(s) Administered  . Fluad Quad(high Dose 65+) 09/07/2019  . Influenza,inj,Quad PF,6+ Mos 10/29/2016, 01/03/2018, 10/08/2018  . Moderna Sars-Covid-2 Vaccination 02/25/2020, 03/29/2020  . Pneumococcal Conjugate-13 08/02/2017  . Pneumococcal Polysaccharide-23 08/04/2018    ROS: Denies dizziness, LOC, polyuria, polydipsia, unintended weight loss/gain, foot ulcerations, numbness or tingling in extremities, shortness of breath or chest pain.  2.  Brittle nails Patient reports that her nails have been brittle and splitting of the tips.  No change in diet.  No recent chemical exposure and or nail services.  She eats a balanced diet takes a multivitamin daily.  3.  Anxiety Anxiety stable with use of Zoloft.  She needs refills.  ROS: Per HPI  Allergies  Allergen Reactions  . Glipizide Itching  . Pravachol [Pravastatin Sodium] Other (See Comments)    Stomach pain and constipation    Past Medical History:  Diagnosis Date  . Anxiety   . Arthritis    of the neck  . Colon polyps   . Diabetes mellitus without complication ([place])   . Esophageal stricture   . Essential hypertension 01/05/2020  . GERD (gastroesophageal reflux disease)    Hiatal Hernia  . History of uterine cancer   . Hyperlipidemia   . Hypertension   . Osteoporosis   . Pacemaker   . Symptomatic bradycardia     Current Outpatient Medications:  .  aspirin EC  81 MG EC tablet, Take 1 tablet (81 mg total) by mouth daily., Disp: 30 tablet, Rfl: 0 .  Blood Glucose Monitoring Suppl (ONE TOUCH ULTRA 2) w/Device KIT, 1 Device by Does not apply route daily., Disp: 1 each, Rfl: 0 .  BYSTOLIC 5 MG tablet, TAKE (1) TABLET TWICE A DAY., Disp: 60 tablet, Rfl: 11 .  calcium citrate-vitamin D (CITRACAL+D) 315-200 MG-UNIT per tablet, Take 1 tablet by mouth daily. , Disp: , Rfl:  .  glucose blood test strip, Use to check BG up to qd, Disp: 100 each, Rfl: 2 .  metFORMIN (GLUCOPHAGE) 500 MG tablet, Take 1 tablet each morning and 1/2 tablet each evening., Disp: 135 tablet, Rfl: 3 .  ONETOUCH DELICA LANCETS FINE MISC, Use to check BG up to qd, Disp: 100 each, Rfl: 2 .  pantoprazole (PROTONIX) 40 MG tablet, Take 1 tablet (40 mg total) by mouth daily., Disp: 90 tablet, Rfl: 3 .  sertraline (ZOLOFT) 50 MG tablet, TAKE 1 TABLET DAILY, Disp: 90 tablet, Rfl: 0 Social History   Socioeconomic History  . Marital status: Divorced    Spouse name: Not on file  . Number of children: 1  . Years of education: GED  . Highest education level: GED or equivalent  Occupational History  . Occupation: retired    Comment: Engineer, manufacturing systems  Tobacco Use  . Smoking status: Never Smoker  . Smokeless tobacco: Never Used  Vaping Use  . Vaping Use: Never used  Substance and Sexual Activity  . Alcohol use: No  Comment: may have a social drink rarely  . Drug use: No  . Sexual activity: Not Currently  Other Topics Concern  . Not on file  Social History Narrative  . Not on file   Social Determinants of Health   Financial Resource Strain: Low Risk   . Difficulty of Paying Living Expenses: Not very hard  Food Insecurity: No Food Insecurity  . Worried About Charity fundraiser in the Last Year: Never true  . Ran Out of Food in the Last Year: Never true  Transportation Needs: No Transportation Needs  . Lack of Transportation (Medical): No  . Lack of Transportation (Non-Medical): No   Physical Activity: Inactive  . Days of Exercise per Week: 0 days  . Minutes of Exercise per Session: 0 min  Stress: No Stress Concern Present  . Feeling of Stress : Not at all  Social Connections: Socially Integrated  . Frequency of Communication with Friends and Family: More than three times a week  . Frequency of Social Gatherings with Friends and Family: More than three times a week  . Attends Religious Services: More than 4 times per year  . Active Member of Clubs or Organizations: Yes  . Attends Archivist Meetings: More than 4 times per year  . Marital Status: Living with partner  Intimate Partner Violence: Not At Risk  . Fear of Current or Ex-Partner: No  . Emotionally Abused: No  . Physically Abused: No  . Sexually Abused: No   Family History  Problem Relation Age of Onset  . Heart disease Mother   . Heart attack Mother   . Heart disease Father   . Heart attack Father   . Cancer Sister        breast  . Breast cancer Sister   . Alcohol abuse Brother   . COPD Sister   . Healthy Daughter     Objective: Office vital signs reviewed. BP 138/74   Pulse 68   Temp (!) 96.9 F (36.1 C) (Temporal)   Ht '5\' 4"'  (1.626 m)   Wt 157 lb (71.2 kg)   SpO2 98%   BMI 26.95 kg/m   Physical Examination:  General: Awake, alert, well nourished, No acute distress HEENT: Normal, sclera white, no carotid bruits Cardio: regular rate and rhythm, S1S2 heard, no murmurs appreciated Pulm: clear to auscultation bilaterally, no wheezes, rhonchi or rales; normal work of breathing on room air Extremities: warm, well perfused, No edema, cyanosis or clubbing; +2 pulses bilaterally Neuro: see foot exam  Diabetic Foot Exam - Simple   Simple Foot Form Diabetic Foot exam was performed with the following findings: Yes 12/12/2020  5:13 PM  Visual Inspection No deformities, no ulcerations, no other skin breakdown bilaterally: Yes Sensation Testing Intact to touch and monofilament  testing bilaterally: Yes Pulse Check Posterior Tibialis and Dorsalis pulse intact bilaterally: Yes Comments     Assessment/ Plan: 69 y.o. female   Controlled type 2 diabetes mellitus with other specified complication, without long-term current use of insulin (HCC) - Plan: Bayer DCA Hb A1c Waived, Bayer DCA Hb A1c Waived  Hyperlipidemia associated with type 2 diabetes mellitus ([place]) - Plan: Lipid panel, CMP14+EGFR  Hypertension associated with diabetes ([place]) - Plan: Lipid panel, CMP14+EGFR  Osteopenia of multiple sites - Plan: CMP14+EGFR, TSH, VITAMIN D 25 Hydroxy (Vit-D Deficiency, Fractures)  Anxiety - Plan: sertraline (ZOLOFT) 50 MG tablet  Brittle nails  Diabetes is under excellent control.  Continue current regimen Plan for fasting lipid panel,  metabolic panel and repeat A1c in 3-6 months, prior to her annual physical exam  Vitamin D will also be collected given history of osteopenia.  I reviewed her LifeScan results but she is already had a DEXA scan.  We will continue to monitor at appropriate intervals given osteopenia  Anxiety is stable.  Zoloft renewed  I suspect brittle nails are likely indicative of deficiencies in biotin and/or collagen.  Recommended hair skin nail vitamin.  Nothing to suggest iron deficiency at this time.  No orders of the defined types were placed in this encounter.  No orders of the defined types were placed in this encounter.    Janora Norlander, DO [place] [phone]

## 2020-12-12 NOTE — Patient Instructions (Addendum)
6 month follow up for full physical with fasting labs.  Future lab orders are in. Come in prior to visit if you want to discuss in person at your physical.  Sugar is well controlled. A1c 6.5.  No medication changes needed  Hair, skin and nail supplement containing Biotin will help with your brittle nails.

## 2020-12-22 ENCOUNTER — Ambulatory Visit
Admission: RE | Admit: 2020-12-22 | Discharge: 2020-12-22 | Disposition: A | Discharge status: Home / Self Care | Payer: PPO | Source: Ambulatory Visit | Attending: Family Medicine | Admitting: Family Medicine

## 2020-12-22 ENCOUNTER — Other Ambulatory Visit: Payer: Self-pay

## 2020-12-22 DIAGNOSIS — R928 Other abnormal and inconclusive findings on diagnostic imaging of breast: Secondary | ICD-10-CM

## 2020-12-22 DIAGNOSIS — N6313 Unspecified lump in the right breast, lower outer quadrant: Secondary | ICD-10-CM | POA: Diagnosis not present

## 2020-12-22 DIAGNOSIS — N6311 Unspecified lump in the right breast, upper outer quadrant: Secondary | ICD-10-CM | POA: Diagnosis not present

## 2021-01-03 ENCOUNTER — Telehealth: Payer: PPO

## 2021-01-05 ENCOUNTER — Other Ambulatory Visit: Payer: Self-pay

## 2021-01-05 ENCOUNTER — Encounter: Payer: Self-pay | Admitting: Internal Medicine

## 2021-01-05 ENCOUNTER — Ambulatory Visit: Payer: PPO | Admitting: Internal Medicine

## 2021-01-05 VITALS — BP 132/74 | HR 67 | Ht 64.0 in | Wt 157.8 lb

## 2021-01-05 DIAGNOSIS — R001 Bradycardia, unspecified: Secondary | ICD-10-CM | POA: Diagnosis not present

## 2021-01-05 DIAGNOSIS — I495 Sick sinus syndrome: Secondary | ICD-10-CM

## 2021-01-05 DIAGNOSIS — I10 Essential (primary) hypertension: Secondary | ICD-10-CM

## 2021-01-05 NOTE — Progress Notes (Signed)
HPI Mrs. Nemes returns today for followup. She is a pleasant 70 yo woman with HTN, sinus node dysfunction s/p PPM insertion who has had a stroke over a year ago. She has recovered and was noted not to have atrial fib on her device interrogation. She denies chest pain or sob. No edema. She notes occaisional episodes lasting for a minute or two where she has trouble getting her words out.  Allergies  Allergen Reactions  . Glipizide Itching  . Pravachol [Pravastatin Sodium] Other (See Comments)    Stomach pain and constipation      Current Outpatient Medications  Medication Sig Dispense Refill  . aspirin EC 81 MG EC tablet Take 1 tablet (81 mg total) by mouth daily. 30 tablet 0  . Blood Glucose Monitoring Suppl (ONE TOUCH ULTRA 2) w/Device KIT 1 Device by Does not apply route daily. 1 each 0  . BYSTOLIC 5 MG tablet TAKE (1) TABLET TWICE A DAY. 60 tablet 11  . calcium citrate-vitamin D (CITRACAL+D) 315-200 MG-UNIT per tablet Take 1 tablet by mouth daily.    Marland Kitchen glucose blood test strip Use to check BG up to qd 100 each 2  . metFORMIN (GLUCOPHAGE) 500 MG tablet Take 1 tablet each morning and 1/2 tablet each evening. 135 tablet 3  . ONETOUCH DELICA LANCETS FINE MISC Use to check BG up to qd 100 each 2  . pantoprazole (PROTONIX) 40 MG tablet Take 1 tablet (40 mg total) by mouth daily. 90 tablet 3  . sertraline (ZOLOFT) 50 MG tablet Take 1 tablet (50 mg total) by mouth daily. 90 tablet 3   No current facility-administered medications for this visit.     Past Medical History:  Diagnosis Date  . Anxiety   . Arthritis    of the neck  . Colon polyps   . Diabetes mellitus without complication ([place])   . Esophageal stricture   . Essential hypertension 01/05/2020  . GERD (gastroesophageal reflux disease)    Hiatal Hernia  . History of uterine cancer   . Hyperlipidemia   . Hypertension   . Osteoporosis   . Pacemaker   . Symptomatic bradycardia     ROS:   All systems reviewed and  negative except as noted in the HPI.   Past Surgical History:  Procedure Laterality Date  . APPENDECTOMY    . CHOLECYSTECTOMY    . LAPAROSCOPIC TOTAL HYSTERECTOMY    . LYMPH NODE DISSECTION     bilateral pelvic and right-sided periaortic  . PACEMAKER GENERATOR CHANGE N/A 05/21/2014   Procedure: PACEMAKER GENERATOR CHANGE;  Surgeon: Evans Lance, MD;  Location: [facility] CATH LAB;  Service: Cardiovascular;  Laterality: N/A;  . status post pacemaker     metronic [id number]  . TRANSTHORACIC ECHOCARDIOGRAM  08/30/04  . TUBAL LIGATION       Family History  Problem Relation Age of Onset  . Heart disease Mother   . Heart attack Mother   . Heart disease Father   . Heart attack Father   . Cancer Sister        breast  . Breast cancer Sister   . Alcohol abuse Brother   . COPD Sister   . Healthy Daughter      Social History   Socioeconomic History  . Marital status: Divorced    Spouse name: Not on file  . Number of children: 1  . Years of education: GED  . Highest education level: GED or equivalent  Occupational  History  . Occupation: retired    Comment: Engineer, manufacturing systems  Tobacco Use  . Smoking status: Never Smoker  . Smokeless tobacco: Never Used  Vaping Use  . Vaping Use: Never used  Substance and Sexual Activity  . Alcohol use: No    Comment: may have a social drink rarely  . Drug use: No  . Sexual activity: Not Currently  Other Topics Concern  . Not on file  Social History Narrative  . Not on file   Social Determinants of Health   Financial Resource Strain: Not on file  Food Insecurity: Not on file  Transportation Needs: Not on file  Physical Activity: Not on file  Stress: Not on file  Social Connections: Not on file  Intimate Partner Violence: Not At Risk  . Fear of Current or Ex-Partner: No  . Emotionally Abused: No  . Physically Abused: No  . Sexually Abused: No     BP 132/74   Pulse 67   Ht '5\' 4"'  (1.626 m)   Wt 157 lb 12.8 oz (71.6 kg)   SpO2 97%    BMI 27.09 kg/m   Physical Exam:  Well appearing 70 yo woman, NAD HEENT: Unremarkable Neck:  No JVD, no thyromegally Lymphatics:  No adenopathy Back:  No CVA tenderness Lungs:  Clear with no wheezes HEART:  Regular rate rhythm, no murmurs, no rubs, no clicks Abd:  soft, positive bowel sounds, no organomegally, no rebound, no guarding Ext:  2 plus pulses, no edema, no cyanosis, no clubbing Skin:  No rashes no nodules Neuro:  CN II through XII intact, motor grossly intact  DEVICE  Normal device function.  See PaceArt for details.   Assess/Plan: 1. Sinus node dysfunction - she is asymptomatic s/p PPM insertion.  2. 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 We will recheck in several months. 3. HTN - her SBP is elevated and I have encouraged her to reduce her sodium intake. She notes that her bp has been better at home. 4. TIA - I asked her to discuss with her neurologist. It is unclear that her occaisional minutes where she has trouble talking his a TIA or not. She will continue her current meds. Her bp is controlled. No indication for systemic anti-coagulation as she has not had any atrial fib.  Carleene Overlie Skyy Nilan,MD

## 2021-01-05 NOTE — Patient Instructions (Signed)
Medication Instructions:  Your physician recommends that you continue on your current medications as directed. Please refer to the Current Medication list given to you today.  Labwork: None ordered.  Testing/Procedures: None ordered.  Follow-Up: Your physician wants you to follow-up in: one year with Cristopher Peru, MD or one of the following Advanced Practice Providers on your designated Care Team:    Chanetta Marshall, NP  Tommye Standard, PA-C  Legrand Como "Jonni Sanger" [place], [place]  Remote monitoring is used to monitor your Pacemaker from home. This monitoring reduces the number of office visits required to check your device to one time per year. It allows [place] to keep an eye on the functioning of your device to ensure it is working properly. You are scheduled for a device check from home on 02/16/2021. You may send your transmission at any time that day. If you have a wireless device, the transmission will be sent automatically. After your physician reviews your transmission, you will receive a postcard with your next transmission date.  Any Other Special Instructions Will Be Listed Below (If Applicable).  If you need a refill on your cardiac medications before your next appointment, please call your pharmacy.

## 2021-01-18 ENCOUNTER — Ambulatory Visit (INDEPENDENT_AMBULATORY_CARE_PROVIDER_SITE_OTHER): Payer: PPO

## 2021-01-18 ENCOUNTER — Other Ambulatory Visit: Payer: Self-pay

## 2021-01-18 DIAGNOSIS — Z23 Encounter for immunization: Secondary | ICD-10-CM

## 2021-01-18 NOTE — Progress Notes (Signed)
   Covid-19 Vaccination Clinic  Name:  [name]    MRN: [id number] DOB: [birth year]  01/18/2021  [name] was observed post Covid-19 immunization for 15 minutes without incident. She was provided with Vaccine Information Sheet and instruction to access the V-Safe system.   [name] was instructed to call 911 with any severe reactions post vaccine: Marland Kitchen Difficulty breathing  . Swelling of face and throat  . A fast heartbeat  . A bad rash all over body  . Dizziness and weakness   Immunizations Administered    Name Date Dose VIS Date Route   Moderna Covid-19 Booster Vaccine 01/18/2021 12:12 PM 0.25 mL 10/05/2020 Intramuscular   Manufacturer: Moderna   Lot: [id number]   [place]: [id number]

## 2021-01-19 ENCOUNTER — Other Ambulatory Visit: Payer: Self-pay | Admitting: Internal Medicine

## 2021-01-19 DIAGNOSIS — I10 Essential (primary) hypertension: Secondary | ICD-10-CM

## 2021-02-16 ENCOUNTER — Ambulatory Visit (INDEPENDENT_AMBULATORY_CARE_PROVIDER_SITE_OTHER): Payer: PPO

## 2021-02-16 DIAGNOSIS — R001 Bradycardia, unspecified: Secondary | ICD-10-CM | POA: Diagnosis not present

## 2021-02-20 LAB — CUP PACEART REMOTE DEVICE CHECK
Battery Impedance: 494 Ohm
Battery Remaining Longevity: 102 mo
Battery Voltage: 2.78 V
Brady Statistic AP VP Percent: 0 %
Brady Statistic AP VS Percent: 0 %
Brady Statistic AS VP Percent: 0 %
Brady Statistic AS VS Percent: 100 %
Date Time Interrogation Session: 20220303191036
Implantable Lead Implant Date: 20050914
Implantable Lead Implant Date: 20050914
Implantable Lead Location: 753859
Implantable Lead Location: 753860
Implantable Lead Model: 5076
Implantable Lead Model: 5076
Implantable Pulse Generator Implant Date: 20150605
Lead Channel Impedance Value: 496 Ohm
Lead Channel Impedance Value: 510 Ohm
Lead Channel Pacing Threshold Amplitude: 0.5 V
Lead Channel Pacing Threshold Amplitude: 0.75 V
Lead Channel Pacing Threshold Pulse Width: 0.4 ms
Lead Channel Pacing Threshold Pulse Width: 0.4 ms
Lead Channel Setting Pacing Amplitude: 2 V
Lead Channel Setting Pacing Amplitude: 2.5 V
Lead Channel Setting Pacing Pulse Width: 0.4 ms
Lead Channel Setting Sensing Sensitivity: 4 mV

## 2021-02-27 NOTE — Progress Notes (Signed)
Remote pacemaker transmission.   

## 2021-04-10 ENCOUNTER — Encounter: Payer: Self-pay | Admitting: Family Medicine

## 2021-04-10 ENCOUNTER — Ambulatory Visit (INDEPENDENT_AMBULATORY_CARE_PROVIDER_SITE_OTHER): Payer: PPO | Admitting: Family Medicine

## 2021-04-10 DIAGNOSIS — S39012A Strain of muscle, fascia and tendon of lower back, initial encounter: Secondary | ICD-10-CM | POA: Diagnosis not present

## 2021-04-10 MED ORDER — CELECOXIB 200 MG PO CAPS: 200.0000 mg | ORAL_CAPSULE | Freq: Every day | ORAL | 0 refills | Status: AC

## 2021-04-10 NOTE — Progress Notes (Signed)
   Virtual Visit  Note Due to COVID-19 pandemic this visit was conducted virtually. This visit type was conducted due to national recommendations for restrictions regarding the COVID-19 Pandemic (e.g. social distancing, sheltering in place) in an effort to limit this patient's exposure and mitigate transmission in our community. All issues noted in this document were discussed and addressed.  A physical exam was not performed with this format.  I connected with [name] on 04/10/21 at 1225 by telephone and verified that I am speaking with the correct person using two identifiers. [name] is currently located at home and no one is currently with her during the visit. The provider, Gwenlyn Perking, FNP is located in their office at time of visit.  I discussed the limitations, risks, security and privacy concerns of performing an evaluation and management service by telephone and the availability of in person appointments. I also discussed with the patient that there may be a patient responsible charge related to this service. The patient expressed understanding and agreed to proceed.  CC: back pain  History and Present Illness:  HPI  [name] reports low back pain x 3 days. She reports that the pain started after doing some yard work. She denies fall or injury. She was bending a lot that day. She pain has been achy with intermittent sharp shooting pain that radiates across her lower back and down her right hip. The pain seemed to worsen last night. It is an 8/10 at it's worse. The pain is worse with movement, bending, and moving from a seated to standing position. She has taken advil with some relief. She reports that she has had a pulled muscle before and that this feels the same. She took Celebrex when that happened with good relief. She denies numbness, tingling, or changes in bowel or bladder control. Denies fever, chest pain, or dysuria.     ROS As per HPI.     Observations/Objective: Alert and oriented x 3. Able to speak in full sentences without difficulty.    Assessment and Plan: [name] was seen today for back pain.  Diagnoses and all orders for this visit:  Strain of lumbar region, initial encounter Celebrex as below. Discussed tylenol, heat, ice. Follow up for in person evaluation if symptoms do not improve or worsen.  -     celecoxib (CELEBREX) 200 MG capsule; Take 1 capsule (200 mg total) by mouth daily for 7 days.     Follow Up Instructions: Follow up for in person evaluation if symptoms do not improve or worsen.     I discussed the assessment and treatment plan with the patient. The patient was provided an opportunity to ask questions and all were answered. The patient agreed with the plan and demonstrated an understanding of the instructions.   The patient was advised to call back or seek an in-person evaluation if the symptoms worsen or if the condition fails to improve as anticipated.  The above assessment and management plan was discussed with the patient. The patient verbalized understanding of and has agreed to the management plan. Patient is aware to call the clinic if symptoms persist or worsen. Patient is aware when to return to the clinic for a follow-up visit. Patient educated on when it is appropriate to go to the emergency department.   Time call ended:  1237  I provided 12 minutes of  non face-to-face time during this encounter.    Gwenlyn Perking, FNP

## 2021-05-19 ENCOUNTER — Telehealth: Payer: Self-pay | Admitting: Internal Medicine

## 2021-05-19 NOTE — Telephone Encounter (Signed)
Patient called in to say that a new hand held monitor is being sent to her and she said she will send her report in once she get the new monitor. Please advise

## 2021-05-19 NOTE — Telephone Encounter (Signed)
I spoke with the patient and let her know we got her message about receiving a new handheld. She asked do she needs to send a transmission when she receive it? I told her to let it charge for one hour then send the transmission. The patient verbalized understanding and thanked me for the call.

## 2021-05-24 ENCOUNTER — Ambulatory Visit (INDEPENDENT_AMBULATORY_CARE_PROVIDER_SITE_OTHER): Payer: PPO

## 2021-05-24 DIAGNOSIS — I495 Sick sinus syndrome: Secondary | ICD-10-CM

## 2021-05-24 LAB — CUP PACEART REMOTE DEVICE CHECK
Battery Impedance: 566 Ohm
Battery Remaining Longevity: 95 mo
Battery Voltage: 2.77 V
Brady Statistic AP VP Percent: 0 %
Brady Statistic AP VS Percent: 0 %
Brady Statistic AS VP Percent: 0 %
Brady Statistic AS VS Percent: 100 %
Date Time Interrogation Session: 20220607193343
Implantable Lead Implant Date: 20050914
Implantable Lead Implant Date: 20050914
Implantable Lead Location: 753859
Implantable Lead Location: 753860
Implantable Lead Model: 5076
Implantable Lead Model: 5076
Implantable Pulse Generator Implant Date: 20150605
Lead Channel Impedance Value: 510 Ohm
Lead Channel Impedance Value: 540 Ohm
Lead Channel Pacing Threshold Amplitude: 0.375 V
Lead Channel Pacing Threshold Amplitude: 0.625 V
Lead Channel Pacing Threshold Pulse Width: 0.4 ms
Lead Channel Pacing Threshold Pulse Width: 0.4 ms
Lead Channel Setting Pacing Amplitude: 2 V
Lead Channel Setting Pacing Amplitude: 2.5 V
Lead Channel Setting Pacing Pulse Width: 0.4 ms
Lead Channel Setting Sensing Sensitivity: 5.6 mV

## 2021-06-05 ENCOUNTER — Other Ambulatory Visit: Payer: Self-pay

## 2021-06-05 ENCOUNTER — Other Ambulatory Visit: Payer: PPO

## 2021-06-05 DIAGNOSIS — E1169 Type 2 diabetes mellitus with other specified complication: Secondary | ICD-10-CM | POA: Diagnosis not present

## 2021-06-05 DIAGNOSIS — M8589 Other specified disorders of bone density and structure, multiple sites: Secondary | ICD-10-CM | POA: Diagnosis not present

## 2021-06-05 DIAGNOSIS — I152 Hypertension secondary to endocrine disorders: Secondary | ICD-10-CM | POA: Diagnosis not present

## 2021-06-05 DIAGNOSIS — E785 Hyperlipidemia, unspecified: Secondary | ICD-10-CM | POA: Diagnosis not present

## 2021-06-05 DIAGNOSIS — E1159 Type 2 diabetes mellitus with other circulatory complications: Secondary | ICD-10-CM | POA: Diagnosis not present

## 2021-06-05 LAB — BAYER DCA HB A1C WAIVED: HB A1C (BAYER DCA - WAIVED): 6.4 % (ref <7.0)

## 2021-06-06 LAB — LIPID PANEL
Chol/HDL Ratio: 4.7 ratio — ABNORMAL HIGH (ref 0.0–4.4)
Cholesterol, Total: 243 mg/dL — ABNORMAL HIGH (ref 100–199)
HDL: 52 mg/dL (ref >39)
LDL Chol Calc (NIH): 146 mg/dL — ABNORMAL HIGH (ref 0–99)
Triglycerides: 249 mg/dL — ABNORMAL HIGH (ref 0–149)
VLDL Cholesterol Cal: 45 mg/dL — ABNORMAL HIGH (ref 5–40)

## 2021-06-06 LAB — CMP14+EGFR
ALT: 12 IU/L (ref 0–32)
AST: 13 IU/L (ref 0–40)
Albumin/Globulin Ratio: 2.3 — ABNORMAL HIGH (ref 1.2–2.2)
Albumin: 4.6 g/dL (ref 3.8–4.8)
Alkaline Phosphatase: 81 IU/L (ref 44–121)
BUN/Creatinine Ratio: 17 (ref 12–28)
BUN: 13 mg/dL (ref 8–27)
Bilirubin Total: 0.4 mg/dL (ref 0.0–1.2)
CO2: 25 mmol/L (ref 20–29)
Calcium: 9.5 mg/dL (ref 8.7–10.3)
Chloride: 101 mmol/L (ref 96–106)
Creatinine, Ser: 0.76 mg/dL (ref 0.57–1.00)
Globulin, Total: 2 g/dL (ref 1.5–4.5)
Glucose: 136 mg/dL — ABNORMAL HIGH (ref 65–99)
Potassium: 4.3 mmol/L (ref 3.5–5.2)
Sodium: 141 mmol/L (ref 134–144)
Total Protein: 6.6 g/dL (ref 6.0–8.5)
eGFR: 85 mL/min/{1.73_m2} (ref >59)

## 2021-06-06 LAB — TSH: TSH: 1.8 u[IU]/mL (ref 0.450–4.500)

## 2021-06-06 LAB — VITAMIN D 25 HYDROXY (VIT D DEFICIENCY, FRACTURES): Vit D, 25-Hydroxy: 41.6 ng/mL (ref 30.0–100.0)

## 2021-06-13 ENCOUNTER — Encounter: Payer: PPO | Admitting: Family Medicine

## 2021-06-16 NOTE — Progress Notes (Signed)
Remote pacemaker transmission.   

## 2021-06-30 ENCOUNTER — Other Ambulatory Visit: Payer: Self-pay | Admitting: Family Medicine

## 2021-06-30 DIAGNOSIS — E1169 Type 2 diabetes mellitus with other specified complication: Secondary | ICD-10-CM

## 2021-07-10 ENCOUNTER — Other Ambulatory Visit: Payer: Self-pay | Admitting: Family Medicine

## 2021-07-10 DIAGNOSIS — K219 Gastro-esophageal reflux disease without esophagitis: Secondary | ICD-10-CM

## 2021-08-02 DIAGNOSIS — Z9189 Other specified personal risk factors, not elsewhere classified: Secondary | ICD-10-CM

## 2021-08-02 NOTE — Progress Notes (Signed)
[place] [facility])                                            [place] Team                                        Statin Quality Measure Assessment    08/02/2021  [name] 07-24-1951 [id number]  Dr. Lajuana Ripple,   I am a [facility] clinical pharmacist that reviews patients for statin quality initiatives.     Per review of chart and payor information, patient has a diagnosis of diabetes but is not currently filling a statin prescription.  This places patient into the SUPD (Statin Use In Patients with Diabetes) measure for CMS.    Patient stopped taking Crestor last year, with no mention of adverse reactions or intolerances.   The 10-year ASCVD risk score [name], et al., 2013) is: 23.6%   Values used to calculate the score:     Age: 70 years     Sex: Female     Is Non-Hispanic African American: No     Diabetic: Yes     Tobacco smoker: No     Systolic Blood Pressure: [id number] mmHg     Is BP treated: Yes     HDL Cholesterol: 52 mg/dL     Total Cholesterol: 243 mg/dL 06/05/2021     Component Value Date/Time   CHOL 243 (H) 06/05/2021 0940   CHOL 242 (H) 03/02/2013 1606   TRIG 249 (H) 06/05/2021 0940   TRIG 229 (H) 04/15/2014 1207   TRIG 343 (H) 03/02/2013 1606   HDL 52 06/05/2021 0940   HDL 45 04/15/2014 1207   HDL 43 03/02/2013 1606   CHOLHDL 4.7 (H) 06/05/2021 0940   CHOLHDL 5.6 02/14/2019 0501   VLDL 62 (H) 02/14/2019 0501   LDLCALC 146 (H) 06/05/2021 0940   LDLCALC 108 (H) 04/15/2014 1207   LDLCALC 130 (H) 03/02/2013 1606   LDLDIRECT 67 09/07/2019 1315    Please consider ONE of the following recommendations:  Initiate high intensity statin Atorvastatin '40mg'$  once daily, #90, 3 refills   Rosuvastatin '20mg'$  once daily, #90, 3 refills    Initiate moderate intensity          statin with reduced frequency if prior          statin intolerance 1x weekly, #13, 3 refills   2x weekly, #26, 3 refills   3x weekly,  #39, 3 refills    Code for past statin intolerance or  other exclusions (required annually)  Provider Requirements: Associate code during an office visit or telehealth encounter  Drug Induced Myopathy G72.0   Myopathy, unspecified G72.9   Myositis, unspecified M60.9   Rhabdomyolysis [id number]   Alcoholic fatty liver 99991111   Cirrhosis of liver K74.69   Prediabetes R73.03   PCOS E28.2   Toxic liver disease, unspecified K71.9   Adverse effect of antihyperlipidemic and antiarteriosclerotic drugs, initial encounter T46.6X5A    Please let us know your decision.    Thank you!  Reed Breech, PharmD Clinical Pharmacist  [place] [phone]

## 2021-08-04 ENCOUNTER — Encounter: Payer: Self-pay | Admitting: Family Medicine

## 2021-08-04 ENCOUNTER — Ambulatory Visit (INDEPENDENT_AMBULATORY_CARE_PROVIDER_SITE_OTHER): Payer: PPO | Admitting: Family Medicine

## 2021-08-04 ENCOUNTER — Other Ambulatory Visit: Payer: Self-pay

## 2021-08-04 VITALS — BP 133/79 | HR 70 | Temp 97.9°F | Ht 64.0 in | Wt 157.2 lb

## 2021-08-04 DIAGNOSIS — Z0001 Encounter for general adult medical examination with abnormal findings: Secondary | ICD-10-CM | POA: Diagnosis not present

## 2021-08-04 DIAGNOSIS — H5310 Unspecified subjective visual disturbances: Secondary | ICD-10-CM

## 2021-08-04 DIAGNOSIS — R0683 Snoring: Secondary | ICD-10-CM | POA: Diagnosis not present

## 2021-08-04 DIAGNOSIS — R6889 Other general symptoms and signs: Secondary | ICD-10-CM | POA: Diagnosis not present

## 2021-08-04 DIAGNOSIS — M2609 Other specified anomalies of jaw size: Secondary | ICD-10-CM

## 2021-08-04 DIAGNOSIS — E785 Hyperlipidemia, unspecified: Secondary | ICD-10-CM

## 2021-08-04 DIAGNOSIS — K219 Gastro-esophageal reflux disease without esophagitis: Secondary | ICD-10-CM | POA: Diagnosis not present

## 2021-08-04 DIAGNOSIS — M170 Bilateral primary osteoarthritis of knee: Secondary | ICD-10-CM

## 2021-08-04 DIAGNOSIS — E1169 Type 2 diabetes mellitus with other specified complication: Secondary | ICD-10-CM | POA: Diagnosis not present

## 2021-08-04 DIAGNOSIS — F419 Anxiety disorder, unspecified: Secondary | ICD-10-CM | POA: Diagnosis not present

## 2021-08-04 DIAGNOSIS — N761 Subacute and chronic vaginitis: Secondary | ICD-10-CM

## 2021-08-04 DIAGNOSIS — Z0000 Encounter for general adult medical examination without abnormal findings: Secondary | ICD-10-CM

## 2021-08-04 LAB — HM DIABETES EYE EXAM

## 2021-08-04 MED ORDER — DICLOFENAC SODIUM 1 % EX GEL: 4.0000 g | Freq: Four times a day (QID) | CUTANEOUS | 99 refills | Status: AC

## 2021-08-04 MED ORDER — ROSUVASTATIN CALCIUM 10 MG PO TABS: ORAL_TABLET | ORAL | 3 refills | Status: DC

## 2021-08-04 MED ORDER — PANTOPRAZOLE SODIUM 40 MG PO TBEC
40.0000 mg | DELAYED_RELEASE_TABLET | Freq: Every day | ORAL | 3 refills | Status: DC
Start: 2021-08-04 — End: 2022-10-04

## 2021-08-04 MED ORDER — SERTRALINE HCL 50 MG PO TABS: 50.0000 mg | ORAL_TABLET | Freq: Every day | ORAL | 3 refills | Status: DC

## 2021-08-04 NOTE — Patient Instructions (Addendum)
Referral placed back to [facility] for sleep study.  I would like you to hold the metformin to see if perhaps the vaginal symptoms you have been experiencing resolved.  I think that your blood sugar is tightly controlled enough that you likely would not have a significant rise in blood sugar without it.  We are starting Crestor twice per week.  We will plan to repeat fasting cholesterol panel and sugar in 3 months.  You may come prior to your appointment for this if you desire  Voltaren gel has been sent to your pharmacy.  If this is not covered by your insurance and they require you to take over-the-counter Voltaren, you may use it up to 4 times daily on your knees if needed.     I think that you have been experiencing eyestrain.  We talked about how to accommodate for this when doing fine activities

## 2021-08-04 NOTE — Progress Notes (Signed)
[name] is a 70 y.o. female presents to office today for annual physical exam examination.    Concerns today include: 1. DM w/ HTN and HLD Patient had labs obtained in June (continue to have well-controlled diabetes with A1c of 6.4) but her ASCVD risk score was noted to be 23.6%.  Statin therapy was highly recommended but she had previous GI issues with Pravachol.  Has not gotten her diabetic eye exam but does admit that she gets an ocular strain where vision will become blurry after she does something that requires prolonged close up vision.   She admits to vaginal irritation and itching and this seems to be present since she started metformin over a decade ago.  She uses Vagisil creams over-the-counter but this has not really helped that much.  The 10-year ASCVD risk score [name]., et al., 2013) is: 23.6%   Values used to calculate the score:     Age: 34 years     Sex: Female     Is Non-Hispanic African American: No     Diabetic: Yes     Tobacco smoker: No     Systolic Blood Pressure: 045 mmHg     Is BP treated: Yes     HDL Cholesterol: 52 mg/dL     Total Cholesterol: 243 mg/dL  2.  Osteoarthritis Patient reports bilateral knee pain.  Not currently using anything orally but has had some over-the-counter topical creams that she has been using intermittently.  She reports that apparent when she gets up from a seated position and the knees are stiff.  She also experiences this at nighttime where she will get pain sometimes even in her feet if they get cold.  She wonders if it has anything to do with her diastolic as it seems to be related to that.  3.  Snoring Patient with history Of CVA.  Was recommended that she obtain a sleep study quite some time ago but she never did because she was not, that she can wear something over her face. She reports frequent nighttime awakenings and has to urinate during these times.  Sometimes her husband feels that she acts out her dreams.  She  admits to vivid dreams.  Occupation: retired, Marital status: married, Substance use: none Diet: balanced Last eye exam: needs Last dental exam: UTD Last colonoscopy: UTD Last mammogram: UTD Last pap smear: n/a Refills needed today: all Immunizations needed: Immunization History  Administered Date(s) Administered   Fluad Quad(high Dose 65+) 09/07/2019   Influenza,inj,Quad PF,6+ Mos 10/29/2016, 01/03/2018, 10/08/2018   Moderna SARS-COV2 Booster Vaccination 01/18/2021   Moderna Sars-Covid-2 Vaccination 02/25/2020, 03/29/2020   Pneumococcal Conjugate-13 08/02/2017   Pneumococcal Polysaccharide-23 08/04/2018     Past Medical History:  Diagnosis Date   Anxiety    Arthritis    of the neck   Colon polyps    Diabetes mellitus without complication ([place])    Esophageal stricture    Essential hypertension 01/05/2020   GERD (gastroesophageal reflux disease)    Hiatal Hernia   History of uterine cancer    Hyperlipidemia    Hypertension    Osteoporosis    Pacemaker    Symptomatic bradycardia    Social History   Socioeconomic History   Marital status: Divorced    Spouse name: Not on file   Number of children: 1   Years of education: GED   Highest education level: GED or equivalent  Occupational History   Occupation: retired    Comment:  Engineer, manufacturing systems  Tobacco Use   Smoking status: Never   Smokeless tobacco: Never  Vaping Use   Vaping Use: Never used  Substance and Sexual Activity   Alcohol use: No    Comment: may have a social drink rarely   Drug use: No   Sexual activity: Not Currently  Other Topics Concern   Not on file  Social History Narrative   Not on file   Social Determinants of Health   Financial Resource Strain: Not on file  Food Insecurity: Not on file  Transportation Needs: Not on file  Physical Activity: Not on file  Stress: Not on file  Social Connections: Not on file  Intimate Partner Violence: Not on file   Past Surgical History:  Procedure  Laterality Date   APPENDECTOMY     CHOLECYSTECTOMY     LAPAROSCOPIC TOTAL HYSTERECTOMY     LYMPH NODE DISSECTION     bilateral pelvic and right-sided periaortic   PACEMAKER GENERATOR CHANGE N/A 05/21/2014   Procedure: PACEMAKER GENERATOR CHANGE;  Surgeon: Stephanie Lance, MD;  Location[facility] C[facility] LAB;  Service: Cardiovascular;  Laterality: N/A;   status post pacemaker     metronic [id number]   TRANSTHORACIC ECHOCARDIOGRAM  08/30/04   TUBAL LIGATION     Family History  Problem Relation Age of Onset   Heart disease Mother    Heart attack Mother    Heart disease Father    Heart attack Father    Cancer Sister        breast   Breast cancer Sister    Alcohol abuse Brother    COPD Sister    Healthy Daughter     Current Outpatient Medications:    aspirin EC 81 MG EC tablet, Take 1 tablet (81 mg total) by mouth daily., Disp: 30 tablet, Rfl: 0   Blood Glucose Monitoring Suppl (ONE TOUCH ULTRA 2) w/Device KIT, 1 Device by Does not apply route daily., Disp: 1 each, Rfl: 0   BYSTOLIC 5 MG tablet, TAKE (1) TABLET TWICE A DAY., Disp: 180 tablet, Rfl: 3   calcium citrate-vitamin D (CITRACAL+D) 315-200 MG-UNIT per tablet, Take 1 tablet by mouth daily., Disp: , Rfl:    glucose blood test strip, Use to check BG up to qd, Disp: 100 each, Rfl: 2   metFORMIN (GLUCOPHAGE) 500 MG tablet, TAKE 1 TABLET IN THE MORNING AND 1/2 TABLET IN THE EVENING, Disp: 135 tablet, Rfl: 0   ONETOUCH DELICA LANCETS FINE MISC, Use to check BG up to qd, Disp: 100 each, Rfl: 2   pantoprazole (PROTONIX) 40 MG tablet, TAKE 1 TABLET DAILY, Disp: 90 tablet, Rfl: 0   sertraline (ZOLOFT) 50 MG tablet, Take 1 tablet (50 mg total) by mouth daily., Disp: 90 tablet, Rfl: 3  Allergies  Allergen Reactions   Glipizide Itching   Pravachol [Pravastatin Sodium] Other (See Comments)    Stomach pain and constipation      ROS: Review of Systems Pertinent items noted in HPI and remainder of comprehensive ROS otherwise negative.     Physical exam BP 133/79   Pulse 70   Temp 97.9 F (36.6 C)   Ht 5' 4" (1.626 m)   Wt 157 lb 3.2 oz (71.3 kg)   SpO2 96%   BMI 26.98 kg/m  General appearance: alert, cooperative, appears stated age, and no distress Head: Normocephalic, without obvious abnormality, atraumatic Eyes: negative findings: lids and lashes normal, conjunctivae and sclerae normal, corneas clear, and pupils equal, round, reactive to light  and accomodation Ears: normal TM's and external ear canals both ears Nose: Nares normal. Septum midline. Mucosa normal. No drainage or sinus tenderness. Throat:  Asymmetric rise of palate noted.  Oropharynx without lesions Neck: no adenopathy, no carotid bruit, no JVD, supple, symmetrical, trachea midline, and thyroid not enlarged, symmetric, no tenderness/mass/nodules Back:  Thoracic kyphosis is present Lungs: clear to auscultation bilaterally Heart: regular rate and rhythm, S1, S2 normal, no murmur, click, rub or gallop Abdomen: soft, non-tender; bowel sounds normal; no masses,  no organomegaly Extremities: extremities normal, atraumatic, no cyanosis or edema Pulses: 2+ and symmetric Skin:  Multiple pigmented nevi.  She has a hemangioma noted along the left forearm Lymph nodes: Cervical, supraclavicular, and axillary nodes normal. Neurologic: Grossly normal with exception of above Psych: Mood is stable, speech is normal.  Patient is pleasant and interactive Depression screen [facility] 2/9 08/04/2021 12/12/2020 06/09/2020  Decreased Interest 1 0 0  Down, Depressed, Hopeless 0 0 0  PHQ - 2 Score 1 0 0  Altered sleeping 2 0 -  Tired, decreased energy 2 0 -  Change in appetite 0 0 -  Feeling bad or failure about yourself  0 0 -  Trouble concentrating 0 0 -  Moving slowly or fidgety/restless 0 0 -  Suicidal thoughts 0 0 -  PHQ-9 Score 5 0 -  Difficult doing work/chores Not difficult at all - -  Some recent data might be hidden   GAD 7 : Generalized Anxiety Score 08/04/2021  01/13/2019  Nervous, Anxious, on Edge 0 0  Control/stop worrying 0 0  Worry too much - different things 0 0  Trouble relaxing 0 0  Restless 0 0  Easily annoyed or irritable 1 1  Afraid - awful might happen 0 0  Total GAD 7 Score 1 1  Anxiety Difficulty Not difficult at all Not difficult at all    Assessment/ Plan: [name] here for annual physical exam.   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  Since she seems to attribute this vaginitis to metformin use I think it is appropriate for her to discontinue.  We discussed that typically A1c goals are somewhere between 7 and 7.9 in patients greater than 70.  I am not sure how much her A1c is being impacted by low-dose metformin at this point anyways.  We discussed consideration for vaginal examination to look for something like lichen sclerosis as there is treatment for that.  We will recheck in 3 months and proceed with that examination if needed at that time  Recommend statin use given elevated ASCVD risk warm previous stroke.  We will try pulse dosing her statin with Crestor 2 days/week and repeat a fasting lipid panel in 3 months.   Additionally, we discussed  alternatives including Vascepa  The symptoms that she describes sound like ocular strain.  Discussed ways to reduce this when doing fine reading or fine detailed activities.  Diabetic retinal exam was obtained during the office visit with [facility] today  For her osteoarthritis trial with diclofenac.  I do agree with sleep study for what is likely an obstructive sleep apnea in the setting of micrognathia.  Referred her back to [facility] for assistance with this.  We discussed that there are alternative devices including oral devices if needed  Zoloft renewed  GERD stable.  PPI has been renewed  Counseled on healthy lifestyle choices, including diet (rich in fruits, vegetables and lean meats and low in salt and simple carbohydrates) and exercise (at least 30 minutes of moderate physical activity daily).  Patient to follow up in 1 year for annual exam or sooner if needed.  Ashly M. Lajuana Ripple, DO

## 2021-08-05 LAB — MICROALBUMIN / CREATININE URINE RATIO
Creatinine, Urine: 216.8 mg/dL
Microalb/Creat Ratio: 53 mg/g creat — ABNORMAL HIGH (ref 0–29)
Microalbumin, Urine: 114.6 ug/mL

## 2021-08-23 ENCOUNTER — Ambulatory Visit (INDEPENDENT_AMBULATORY_CARE_PROVIDER_SITE_OTHER): Payer: PPO

## 2021-08-23 DIAGNOSIS — I495 Sick sinus syndrome: Secondary | ICD-10-CM | POA: Diagnosis not present

## 2021-08-28 LAB — CUP PACEART REMOTE DEVICE CHECK
Battery Impedance: 615 Ohm
Battery Remaining Longevity: 91 mo
Battery Voltage: 2.77 V
Brady Statistic AP VP Percent: 0 %
Brady Statistic AP VS Percent: 0 %
Brady Statistic AS VP Percent: 0 %
Brady Statistic AS VS Percent: 100 %
Date Time Interrogation Session: 20220912102722
Implantable Lead Implant Date: 20050914
Implantable Lead Implant Date: 20050914
Implantable Lead Location: 753859
Implantable Lead Location: 753860
Implantable Lead Model: 5076
Implantable Lead Model: 5076
Implantable Pulse Generator Implant Date: 20150605
Lead Channel Impedance Value: 516 Ohm
Lead Channel Impedance Value: 548 Ohm
Lead Channel Pacing Threshold Amplitude: 0.375 V
Lead Channel Pacing Threshold Amplitude: 0.75 V
Lead Channel Pacing Threshold Pulse Width: 0.4 ms
Lead Channel Pacing Threshold Pulse Width: 0.4 ms
Lead Channel Setting Pacing Amplitude: 2 V
Lead Channel Setting Pacing Amplitude: 2.5 V
Lead Channel Setting Pacing Pulse Width: 0.4 ms
Lead Channel Setting Sensing Sensitivity: 5.6 mV

## 2021-08-31 NOTE — Progress Notes (Signed)
Remote pacemaker transmission.   

## 2021-09-20 ENCOUNTER — Other Ambulatory Visit: Payer: Self-pay | Admitting: Internal Medicine

## 2021-09-20 DIAGNOSIS — I10 Essential (primary) hypertension: Secondary | ICD-10-CM

## 2021-11-06 ENCOUNTER — Other Ambulatory Visit: Payer: Self-pay

## 2021-11-06 ENCOUNTER — Ambulatory Visit (INDEPENDENT_AMBULATORY_CARE_PROVIDER_SITE_OTHER): Payer: PPO | Admitting: Family Medicine

## 2021-11-06 ENCOUNTER — Encounter: Payer: Self-pay | Admitting: Family Medicine

## 2021-11-06 VITALS — BP 132/73 | HR 69 | Temp 98.0°F | Ht 64.0 in | Wt 160.0 lb

## 2021-11-06 DIAGNOSIS — I152 Hypertension secondary to endocrine disorders: Secondary | ICD-10-CM | POA: Diagnosis not present

## 2021-11-06 DIAGNOSIS — E1169 Type 2 diabetes mellitus with other specified complication: Secondary | ICD-10-CM | POA: Diagnosis not present

## 2021-11-06 DIAGNOSIS — E1159 Type 2 diabetes mellitus with other circulatory complications: Secondary | ICD-10-CM | POA: Diagnosis not present

## 2021-11-06 DIAGNOSIS — M609 Myositis, unspecified: Secondary | ICD-10-CM

## 2021-11-06 DIAGNOSIS — E785 Hyperlipidemia, unspecified: Secondary | ICD-10-CM | POA: Diagnosis not present

## 2021-11-06 DIAGNOSIS — Z23 Encounter for immunization: Secondary | ICD-10-CM | POA: Diagnosis not present

## 2021-11-06 LAB — BAYER DCA HB A1C WAIVED: HB A1C (BAYER DCA - WAIVED): 7 % — ABNORMAL HIGH (ref 4.8–5.6)

## 2021-11-06 MED ORDER — METFORMIN HCL 500 MG PO TABS: 500.0000 mg | ORAL_TABLET | Freq: Every day | ORAL | 3 refills | Status: DC

## 2021-11-06 MED ORDER — EZETIMIBE 10 MG PO TABS: 10.0000 mg | ORAL_TABLET | Freq: Every day | ORAL | 3 refills | Status: DC

## 2021-11-06 NOTE — Patient Instructions (Addendum)
Trial of Zetia (generic should be $4) INSTEAD of the Rosuvastatin.  We can try Livalo next ($15 per 30 days) according to your insurance.  Nexlitol doesn't look to be covered by insurance but we can certainly see if Almyra Free can get you patient assistance if we have to try that one later.  Your sugar is controlled with a1c 7.0 today.  I think it is reasonable to take 1 tablet of Metformin at supper only if your sugar has been causing thirst/ urination.

## 2021-11-06 NOTE — Progress Notes (Signed)
Subjective: CC:DM PCP: Janora Norlander, DO [id number] [name] is a 70 y.o. female presenting to clinic today for:  1. Type 2 Diabetes with hypertension, hyperlipidemia:  Started pulse dosed crestor last visit.  She notes that after 3 weeks of twice weekly dosing she started having nausea and bilateral shoulder pain.  Both of those have resolved since discontinuation of medicine.  At last visit we discussed Zetia versus Vascepa versus Livalo.  Patient is willing to start any of these as she has not tried any of them.  She has been off of her metformin since her last visit due to A1c of less than 7.  She notes that blood sugars have gone up quite a bit since that discontinuation, as high as 200s but as low as 160s.  Sometimes she does have polydipsia and polyuria and this tends to be at the nighttime.  She is compliant with her Bystolic.  No chest pain or shortness of breath reported.  Last eye exam: Up-to-date Last foot exam: Up-to-date Last A1c:  Lab Results  Component Value Date   HGBA1C 6.4 06/05/2021   Nephropathy screen indicated?:  Up-to-date Last flu, zoster and/or pneumovax:  Immunization History  Administered Date(s) Administered   Fluad Quad(high Dose 65+) 09/07/2019   Influenza,inj,Quad PF,6+ Mos 10/29/2016, 01/03/2018, 10/08/2018   Moderna SARS-COV2 Booster Vaccination 01/18/2021   Moderna Sars-Covid-2 Vaccination 02/25/2020, 03/29/2020   Pneumococcal Conjugate-13 08/02/2017   Pneumococcal Polysaccharide-23 08/04/2018    ROS: Per HPI  Allergies  Allergen Reactions   Glipizide Itching   Pravachol [Pravastatin Sodium] Other (See Comments)    Stomach pain and constipation    Past Medical History:  Diagnosis Date   Anxiety    Arthritis    of the neck   Colon polyps    Diabetes mellitus without complication ([place])    Esophageal stricture    Essential hypertension 01/05/2020   GERD (gastroesophageal reflux disease)    Hiatal Hernia   History of uterine cancer     Hyperlipidemia    Hypertension    Osteoporosis    Pacemaker    Symptomatic bradycardia     Current Outpatient Medications:    aspirin EC 81 MG EC tablet, Take 1 tablet (81 mg total) by mouth daily., Disp: 30 tablet, Rfl: 0   Blood Glucose Monitoring Suppl (ONE TOUCH ULTRA 2) w/Device KIT, 1 Device by Does not apply route daily., Disp: 1 each, Rfl: 0   BYSTOLIC 5 MG tablet, TAKE (1) TABLET TWICE A DAY., Disp: 60 tablet, Rfl: 3   calcium citrate-vitamin D (CITRACAL+D) 315-200 MG-UNIT per tablet, Take 1 tablet by mouth daily., Disp: , Rfl:    diclofenac Sodium (VOLTAREN) 1 % GEL, Apply 4 g topically 4 (four) times daily., Disp: 400 g, Rfl: PRN   glucose blood test strip, Use to check BG up to qd, Disp: 100 each, Rfl: 2   metFORMIN (GLUCOPHAGE) 500 MG tablet, TAKE 1 TABLET IN THE MORNING AND 1/2 TABLET IN THE EVENING, Disp: 135 tablet, Rfl: 0   ONETOUCH DELICA LANCETS FINE MISC, Use to check BG up to qd, Disp: 100 each, Rfl: 2   pantoprazole (PROTONIX) 40 MG tablet, Take 1 tablet (40 mg total) by mouth daily., Disp: 90 tablet, Rfl: 3   rosuvastatin (CRESTOR) 10 MG tablet, Take 1 tablet 2 days per week., Disp: 24 tablet, Rfl: 3   sertraline (ZOLOFT) 50 MG tablet, Take 1 tablet (50 mg total) by mouth daily., Disp: 90 tablet, Rfl: 3 Social History  Socioeconomic History   Marital status: Divorced    Spouse name: Not on file   Number of children: 1   Years of education: GED   Highest education level: GED or equivalent  Occupational History   Occupation: retired    Comment: Engineer, manufacturing systems  Tobacco Use   Smoking status: Never   Smokeless tobacco: Never  Scientific laboratory technician Use: Never used  Substance and Sexual Activity   Alcohol use: No    Comment: may have a social drink rarely   Drug use: No   Sexual activity: Not Currently  Other Topics Concern   Not on file  Social History Narrative   Not on file   Social Determinants of Health   Financial Resource Strain: Not on file   Food Insecurity: Not on file  Transportation Needs: Not on file  Physical Activity: Not on file  Stress: Not on file  Social Connections: Not on file  Intimate Partner Violence: Not on file   Family History  Problem Relation Age of Onset   Heart disease Mother    Heart attack Mother    Heart disease Father    Heart attack Father    Cancer Sister        breast   Breast cancer Sister    Alcohol abuse Brother    COPD Sister    Healthy Daughter     Objective: Office vital signs reviewed. BP 132/73   Pulse 69   Temp 98 F (36.7 C)   Ht '5\' 4"'  (1.626 m)   Wt 160 lb (72.6 kg)   SpO2 96%   BMI 27.46 kg/m   Physical Examination:  General: Awake, alert, well nourished, No acute distress Cardio: regular rate and rhythm, S1S2 heard, no murmurs appreciated Pulm: clear to auscultation bilaterally, no wheezes, rhonchi or rales; normal work of breathing on room air Extremities: warm, well perfused, No edema, cyanosis or clubbing; +2 pulses bilaterally  Assessment/ Plan: 70 y.o. female   Controlled type 2 diabetes mellitus with other specified complication, without long-term current use of insulin ([facility]) - Plan: Bayer DCA Hb A1c Waived, metFORMIN (GLUCOPHAGE) 500 MG tablet  Hyperlipidemia associated with type 2 diabetes mellitus ([place]) - Plan: Lipid Panel, Hepatic Function Panel, ezetimibe (ZETIA) 10 MG tablet  Statin-induced myositis  Hypertension associated with diabetes ([place]) - Plan: Hepatic Function Panel  Need for immunization against influenza - Plan: Flu Vaccine QUAD High Dose(Fluad)  Sugar remains controlled with A1c of 7.0 because she is having some symptoms of polydipsia and polyuria she may benefit from at least once daily dosing of metformin.  I think that her risk of hypoglycemia given otherwise pretty good health is lower in this patient.  Resume 500 mg with supper.  Statin intolerant thus far.  We discussed Livalo, which does appear to be covered by her insurance  but at a much higher cost than the Zetia.  We will start Zetia first and see how she tolerates.  Could consider combining with Nexletol if greater reduction needed.  However Nexletol does not appear to be covered by insurance and would certainly require her to visit with Almyra Free for possible patient assistance  Influenza vaccination administered.  Patient may follow-up in 3 months, sooner if concerns arise  No orders of the defined types were placed in this encounter.  No orders of the defined types were placed in this encounter.    Janora Norlander, DO [place] [phone]

## 2021-11-07 LAB — HEPATIC FUNCTION PANEL
ALT: 11 IU/L (ref 0–32)
AST: 10 IU/L (ref 0–40)
Albumin: 4.5 g/dL (ref 3.8–4.8)
Alkaline Phosphatase: 94 IU/L (ref 44–121)
Bilirubin Total: 0.3 mg/dL (ref 0.0–1.2)
Bilirubin, Direct: <0.1 mg/dL (ref 0.00–0.40)
Total Protein: 6.5 g/dL (ref 6.0–8.5)

## 2021-11-07 LAB — LIPID PANEL
Chol/HDL Ratio: 5 ratio — ABNORMAL HIGH (ref 0.0–4.4)
Cholesterol, Total: 226 mg/dL — ABNORMAL HIGH (ref 100–199)
HDL: 45 mg/dL (ref >39)
LDL Chol Calc (NIH): 129 mg/dL — ABNORMAL HIGH (ref 0–99)
Triglycerides: 293 mg/dL — ABNORMAL HIGH (ref 0–149)
VLDL Cholesterol Cal: 52 mg/dL — ABNORMAL HIGH (ref 5–40)

## 2021-11-13 ENCOUNTER — Ambulatory Visit (INDEPENDENT_AMBULATORY_CARE_PROVIDER_SITE_OTHER): Payer: PPO

## 2021-11-13 VITALS — Ht 64.0 in | Wt 160.0 lb

## 2021-11-13 DIAGNOSIS — Z0000 Encounter for general adult medical examination without abnormal findings: Secondary | ICD-10-CM | POA: Diagnosis not present

## 2021-11-13 NOTE — Patient Instructions (Signed)
[name] , Thank you for taking time to come for your Medicare Wellness Visit. I appreciate your ongoing commitment to your health goals. Please review the following plan we discussed and let me know if I can assist you in the future.   Screening recommendations/referrals: Colonoscopy: Done 09/22/2018 - Repeat in 5 years  Mammogram: Done 12/22/2020 - Repeat annually Bone Density: Done 03/09/2020 - Repeat every 2 years Recommended yearly ophthalmology/optometry visit for glaucoma screening and checkup Recommended yearly dental visit for hygiene and checkup  Vaccinations: Influenza vaccine: Done 11/06/2021 - Repeat annually  Pneumococcal vaccine: Done 08/02/2017 & 08/04/2018 Tdap vaccine: Done 10/30/2011 -Repeat in 10 years  Shingles vaccine: Due.   Covid-19: Done 02/25/2020, 03/29/2020, & 01/18/2021  Advanced directives: Please bring a copy of your health care power of attorney and living will to the office to be added to your chart at your convenience.   Conditions/risks identified: Aim for 30 minutes of exercise or brisk walking each day, drink 6-8 glasses of water and eat lots of fruits and vegetables.   Next appointment: Follow up in one year for your annual wellness visit    Preventive Care 65 Years and Older, Female Preventive care refers to lifestyle choices and visits with your health care provider that can promote health and wellness. What does preventive care include? A yearly physical exam. This is also called an annual well check. Dental exams once or twice a year. Routine eye exams. Ask your health care provider how often you should have your eyes checked. Personal lifestyle choices, including: Daily care of your teeth and gums. Regular physical activity. Eating a healthy diet. Avoiding tobacco and drug use. Limiting alcohol use. Practicing safe sex. Taking low-dose aspirin every day. Taking vitamin and mineral supplements as recommended by your health care provider. What  happens during an annual well check? The services and screenings done by your health care provider during your annual well check will depend on your age, overall health, lifestyle risk factors, and family history of disease. Counseling  Your health care provider may ask you questions about your: Alcohol use. Tobacco use. Drug use. Emotional well-being. Home and relationship well-being. Sexual activity. Eating habits. History of falls. Memory and ability to understand (cognition). Work and work Statistician. Reproductive health. Screening  You may have the following tests or measurements: Height, weight, and BMI. Blood pressure. Lipid and cholesterol levels. These may be checked every 5 years, or more frequently if you are over 25 years old. Skin check. Lung cancer screening. You may have this screening every year starting at age 39 if you have a 30-pack-year history of smoking and currently smoke or have quit within the past 15 years. Fecal occult blood test (FOBT) of the stool. You may have this test every year starting at age 13. Flexible sigmoidoscopy or colonoscopy. You may have a sigmoidoscopy every 5 years or a colonoscopy every 10 years starting at age 25. Hepatitis C blood test. Hepatitis B blood test. Sexually transmitted disease (STD) testing. Diabetes screening. This is done by checking your blood sugar (glucose) after you have not eaten for a while (fasting). You may have this done every 1-3 years. Bone density scan. This is done to screen for osteoporosis. You may have this done starting at age 43. Mammogram. This may be done every 1-2 years. Talk to your health care provider about how often you should have regular mammograms. Talk with your health care provider about your test results, treatment options, and if necessary,  the need for more tests. Vaccines  Your health care provider may recommend certain vaccines, such as: Influenza vaccine. This is recommended every  year. Tetanus, diphtheria, and acellular pertussis (Tdap, Td) vaccine. You may need a Td booster every 10 years. Zoster vaccine. You may need this after age 88. Pneumococcal 13-valent conjugate (PCV13) vaccine. One dose is recommended after age 13. Pneumococcal polysaccharide (PPSV23) vaccine. One dose is recommended after age 48. Talk to your health care provider about which screenings and vaccines you need and how often you need them. This information is not intended to replace advice given to you by your health care provider. Make sure you discuss any questions you have with your health care provider. Document Released: 12/30/2015 Document Revised: 08/22/2016 Document Reviewed: 10/04/2015 Elsevier Interactive Patient Education  2017 [place] Prevention in the Home Falls can cause injuries. They can happen to people of all ages. There are many things you can do to make your home safe and to help prevent falls. What can I do on the outside of my home? Regularly fix the edges of walkways and driveways and fix any cracks. Remove anything that might make you trip as you walk through a door, such as a raised step or threshold. Trim any bushes or trees on the path to your home. Use bright outdoor lighting. Clear any walking paths of anything that might make someone trip, such as rocks or tools. Regularly check to see if handrails are loose or broken. Make sure that both sides of any steps have handrails. Any raised decks and porches should have guardrails on the edges. Have any leaves, snow, or ice cleared regularly. Use sand or salt on walking paths during winter. Clean up any spills in your garage right away. This includes oil or grease spills. What can I do in the bathroom? Use night lights. Install grab bars by the toilet and in the tub and shower. Do not use towel bars as grab bars. Use non-skid mats or decals in the tub or shower. If you need to sit down in the shower, use a  plastic, non-slip stool. Keep the floor dry. Clean up any water that spills on the floor as soon as it happens. Remove soap buildup in the tub or shower regularly. Attach bath mats securely with double-sided non-slip rug tape. Do not have throw rugs and other things on the floor that can make you trip. What can I do in the bedroom? Use night lights. Make sure that you have a light by your bed that is easy to reach. Do not use any sheets or blankets that are too big for your bed. They should not hang down onto the floor. Have a firm chair that has side arms. You can use this for support while you get dressed. Do not have throw rugs and other things on the floor that can make you trip. What can I do in the kitchen? Clean up any spills right away. Avoid walking on wet floors. Keep items that you use a lot in easy-to-reach places. If you need to reach something above you, use a strong step stool that has a grab bar. Keep electrical cords out of the way. Do not use floor polish or wax that makes floors slippery. If you must use wax, use non-skid floor wax. Do not have throw rugs and other things on the floor that can make you trip. What can I do with my stairs? Do not leave any items on  the stairs. Make sure that there are handrails on both sides of the stairs and use them. Fix handrails that are broken or loose. Make sure that handrails are as long as the stairways. Check any carpeting to make sure that it is firmly attached to the stairs. Fix any carpet that is loose or worn. Avoid having throw rugs at the top or bottom of the stairs. If you do have throw rugs, attach them to the floor with carpet tape. Make sure that you have a light switch at the top of the stairs and the bottom of the stairs. If you do not have them, ask someone to add them for you. What else can I do to help prevent falls? Wear shoes that: Do not have high heels. Have rubber bottoms. Are comfortable and fit you  well. Are closed at the toe. Do not wear sandals. If you use a stepladder: Make sure that it is fully opened. Do not climb a closed stepladder. Make sure that both sides of the stepladder are locked into place. Ask someone to hold it for you, if possible. Clearly mark and make sure that you can see: Any grab bars or handrails. First and last steps. Where the edge of each step is. Use tools that help you move around (mobility aids) if they are needed. These include: Canes. Walkers. Scooters. Crutches. Turn on the lights when you go into a dark area. Replace any light bulbs as soon as they burn out. Set up your furniture so you have a clear path. Avoid moving your furniture around. If any of your floors are uneven, fix them. If there are any pets around you, be aware of where they are. Review your medicines with your doctor. Some medicines can make you feel dizzy. This can increase your chance of falling. Ask your doctor what other things that you can do to help prevent falls. This information is not intended to replace advice given to you by your health care provider. Make sure you discuss any questions you have with your health care provider. Document Released: 09/29/2009 Document Revised: 05/10/2016 Document Reviewed: 01/07/2015 Elsevier Interactive Patient Education  2017 [employer].

## 2021-11-13 NOTE — Progress Notes (Signed)
Subjective:   [name] is a 70 y.o. female who presents for Medicare Annual (Subsequent) preventive examination.  Virtual Visit via Telephone Note  I connected with  [name] on 11/13/21 at 11:15 AM EST by telephone and verified that I am speaking with the correct person using two identifiers.  Location: Patient: Home Provider: [facility] Persons participating in the virtual visit: patient/Nurse Health Advisor   I discussed the limitations, risks, security and privacy concerns of performing an evaluation and management service by telephone and the availability of in person appointments. The patient expressed understanding and agreed to proceed.  Interactive audio and video telecommunications were attempted between this nurse and patient, however failed, due to patient having technical difficulties OR patient did not have access to video capability.  We continued and completed visit with audio only.  Some vital signs may be absent or patient reported.   Analynn Daum E Riata Ikeda, LPN   Review of Systems     Cardiac Risk Factors include: advanced age (>79mn, >>15women);diabetes mellitus;dyslipidemia;hypertension;sedentary lifestyle;Other (see comment), Risk factor comments: has pacemaker, hx of occipital stroke     Objective:    Today's Vitals   11/13/21 1116  Weight: 160 lb (72.6 kg)  Height: '5\' 4"'  (1.626 m)   Body mass index is 27.46 kg/m.  Advanced Directives 11/13/2021 03/24/2020 02/14/2019 02/13/2019 02/13/2019 08/04/2018 08/02/2017  Does Patient Have a Medical Advance Directive? Yes Yes - Yes Yes Yes Yes  Type of Advance Directive [place] will Living will Healthcare Power of [place] Living will;Healthcare Power of [place] will  Does patient want to make changes to medical advance directive? - No - Patient declined No - Patient declined - - No - Patient declined Yes  (MAU/Ambulatory/Procedural Areas - Information given)  Copy of [place] Chart? No - copy requested - No - copy requested No - copy requested - No - copy requested No - copy requested  Would patient like information on creating a medical advance directive? - - - - No - Patient declined No - Patient declined -    Current Medications (verified) Outpatient Encounter Medications as of 11/13/2021  Medication Sig   aspirin EC 81 MG EC tablet Take 1 tablet (81 mg total) by mouth daily.   Blood Glucose Monitoring Suppl (ONE TOUCH ULTRA 2) w/Device KIT 1 Device by Does not apply route daily.   BYSTOLIC 5 MG tablet TAKE (1) TABLET TWICE A DAY.   calcium citrate-vitamin D (CITRACAL+D) 315-200 MG-UNIT per tablet Take 1 tablet by mouth daily.   diclofenac Sodium (VOLTAREN) 1 % GEL Apply 4 g topically 4 (four) times daily.   ezetimibe (ZETIA) 10 MG tablet Take 1 tablet (10 mg total) by mouth daily.   glucose blood test strip Use to check BG up to qd   metFORMIN (GLUCOPHAGE) 500 MG tablet Take 1 tablet (500 mg total) by mouth daily with supper.   ONETOUCH DELICA LANCETS FINE MISC Use to check BG up to qd   pantoprazole (PROTONIX) 40 MG tablet Take 1 tablet (40 mg total) by mouth daily.   rosuvastatin (CRESTOR) 10 MG tablet Take 1 tablet 2 days per week.   sertraline (ZOLOFT) 50 MG tablet Take 1 tablet (50 mg total) by mouth daily.   No facility-administered encounter medications on file as of 11/13/2021.    Allergies (verified) Glipizide and Pravachol [pravastatin sodium]   History: Past Medical History:  Diagnosis Date   Anxiety    Arthritis    of the neck   Colon polyps    Diabetes mellitus without complication ([place])    Esophageal stricture    Essential hypertension 01/05/2020   GERD (gastroesophageal reflux disease)    Hiatal Hernia   History of uterine cancer    Hyperlipidemia    Hypertension    Osteoporosis    Pacemaker    Symptomatic bradycardia    Past  Surgical History:  Procedure Laterality Date   APPENDECTOMY     CHOLECYSTECTOMY     LAPAROSCOPIC TOTAL HYSTERECTOMY     LYMPH NODE DISSECTION     bilateral pelvic and right-sided periaortic   PACEMAKER GENERATOR CHANGE N/A 05/21/2014   Procedure: PACEMAKER GENERATOR CHANGE;  Surgeon: Evans Lance, MD;  Location[facility] CATH LAB;  Service: Cardiovascular;  Laterality: N/A;   status post pacemaker     metronic [id number]   TRANSTHORACIC ECHOCARDIOGRAM  08/30/04   TUBAL LIGATION     Family History  Problem Relation Age of Onset   Heart disease Mother    Heart attack Mother    Heart disease Father    Heart attack Father    Cancer Sister        breast   Breast cancer Sister    Alcohol abuse Brother    COPD Sister    Healthy Daughter    Social History   Socioeconomic History   Marital status: Significant Other    Spouse name: Not on file   Number of children: 1   Years of education: GED   Highest education level: GED or equivalent  Occupational History   Occupation: retired    Comment: Engineer, manufacturing systems  Tobacco Use   Smoking status: Never   Smokeless tobacco: Never  Scientific laboratory technician Use: Never used  Substance and Sexual Activity   Alcohol use: No    Comment: may have a social drink rarely   Drug use: No   Sexual activity: Not Currently  Other Topics Concern   Not on file  Social History Narrative   Not on file   Social Determinants of Health   Financial Resource Strain: Low Risk    Difficulty of Paying Living Expenses: Not hard at all  Food Insecurity: No Food Insecurity   Worried About Charity fundraiser in the Last Year: Never true   [place] in the Last Year: Never true  Transportation Needs: No Transportation Needs   Lack of Transportation (Medical): No   Lack of Transportation (Non-Medical): No  Physical Activity: Sufficiently Active   Days of Exercise per Week: 5 days   Minutes of Exercise per Session: 30 min  Stress: No Stress Concern Present    Feeling of Stress : Only a little  Social Connections: Engineer, building services of Communication with Friends and Family: More than three times a week   Frequency of Social Gatherings with Friends and Family: More than three times a week   Attends Religious Services: 1 to 4 times per year   Active Member of [employer] or Organizations: Yes   Attends Archivist Meetings: 1 to 4 times per year   Marital Status: Living with partner    Tobacco Counseling Counseling given: Not Answered   Clinical Intake:  Pre-visit preparation completed: Yes  Pain : No/denies pain     BMI - recorded: 27.46 Nutritional Status: BMI 25 -29 Overweight Nutritional Risks: None Diabetes: Yes CBG  done?: No Did pt. bring in CBG monitor from home?: No  How often do you need to have someone help you when you read instructions, pamphlets, or other written materials from your doctor or pharmacy?: 1 - Never  Diabetic? Yes Nutrition Risk Assessment:  Has the patient had any N/V/D within the last 2 months?  No  Does the patient have any non-healing wounds?  No  Has the patient had any unintentional weight loss or weight gain?  No   Diabetes:  Is the patient diabetic?  Yes  If diabetic, was a CBG obtained today?  No  Did the patient bring in their glucometer from home?  No  How often do you monitor your CBG's? 3x per week or less.   Financial Strains and Diabetes Management:  Are you having any financial strains with the device, your supplies or your medication? No .  Does the patient want to be seen by Chronic Care Management for management of their diabetes?  No  Would the patient like to be referred to a Nutritionist or for Diabetic Management?  No   Diabetic Exams:  Diabetic Eye Exam: Completed 08/04/2021.   Diabetic Foot Exam: Completed 12/12/2020. Pt has been advised about the importance in completing this exam. Pt is scheduled for diabetic foot exam on 01/2022.    Interpreter  Needed?: No  Information entered by :: Azizi Bally, LPN   Activities of Daily Living In your present state of health, do you have any difficulty performing the following activities: 11/13/2021  Hearing? Y  Vision? N  Difficulty concentrating or making decisions? Y  Walking or climbing stairs? Y  Dressing or bathing? N  Doing errands, shopping? N  Preparing Food and eating ? N  Using the Toilet? N  In the past six months, have you accidently leaked urine? Y  Comment when she waits too long, sneezes, coughs - usually wears pads or pantyliner for protection  Do you have problems with loss of bowel control? N  Managing your Medications? N  Managing your Finances? N  Housekeeping or managing your Housekeeping? N  Some recent data might be hidden    Patient Care Team: Janora Norlander, DO as PCP - General (Family Medicine) Evans Lance, MD as Consulting Physician (Cardiology) Harlen Labs, MD as Referring Physician (Optometry) Ilean China, RN as Registered Nurse  Indicate any recent Medical Services you may have received from other than [facility] providers in the past year (date may be approximate).     Assessment:   This is a routine wellness examination for [name].  Hearing/Vision screen Hearing Screening - Comments:: Moderate hearing difficulties - wears hearing aids from [facility] Vision Screening - Comments:: Wears rx glasses - up to date with annual eye exams at [facility] issues and exercise activities discussed: Current Exercise Habits: Home exercise routine, Type of exercise: walking;Other - see comments (house work), Time (Minutes): 30, Frequency (Times/Week): 5, Weekly Exercise (Minutes/Week): 150, Intensity: Mild, Exercise limited by: orthopedic condition(s);cardiac condition(s)   Goals Addressed             This Visit's Progress    Exercise 150 minutes per week (moderate activity)   Not on track      Depression Screen PHQ 2/9 Scores  11/13/2021 11/06/2021 08/04/2021 12/12/2020 06/09/2020 03/24/2020 03/09/2020  PHQ - 2 Score 0 0 1 0 0 0 0  PHQ- 9 Score '3 3 5 ' 0 - - 0    Fall Risk Fall  Risk  11/13/2021 11/06/2021 08/04/2021 12/12/2020 06/09/2020  Falls in the past year? 0 0 0 0 0  Number falls in past yr: 0 - - - -  Injury with Fall? 0 - - - -  Risk for fall due to : Orthopedic patient;Impaired balance/gait - - - -  Follow up Falls prevention discussed - - - -    FALL RISK PREVENTION PERTAINING TO THE HOME:  Any stairs in or around the home? Yes  If so, are there any without handrails? No  Home free of loose throw rugs in walkways, pet beds, electrical cords, etc? Yes  Adequate lighting in your home to reduce risk of falls? Yes   ASSISTIVE DEVICES UTILIZED TO PREVENT FALLS:  Life alert? No  Use of a cane, walker or w/c? No  Grab bars in the bathroom? No  Shower chair or bench in shower? No  Elevated toilet seat or a handicapped toilet? No   TIMED UP AND GO:  Was the test performed? No . Telephonic visit  Cognitive Function: MMSE - Mini Mental State Exam 08/02/2017  Orientation to time 5  Orientation to Place 5  Registration 3  Attention/ Calculation 5  Recall 3  Language- name 2 objects 2  Language- repeat 1  Language- follow 3 step command 3  Language- read & follow direction 1  Write a sentence 1  Copy design 1  Total score 30     6CIT Screen 11/13/2021 03/24/2020 03/24/2020  What Year? 0 points 0 points 0 points  What month? 0 points 0 points 0 points  What time? 0 points 0 points 0 points  Count back from 20 0 points 0 points -  Months in reverse 0 points 0 points -  Repeat phrase 4 points 0 points -  Total Score 4 0 -    Immunizations Immunization History  Administered Date(s) Administered   Fluad Quad(high Dose 65+) 09/07/2019, 11/06/2021   Influenza,inj,Quad PF,6+ Mos 10/29/2016, 01/03/2018, 10/08/2018   Moderna SARS-COV2 Booster Vaccination 01/18/2021   Moderna Sars-Covid-2 Vaccination  02/25/2020, 03/29/2020   Pneumococcal Conjugate-13 08/02/2017   Pneumococcal Polysaccharide-23 08/04/2018   Tdap 10/30/2011    TDAP status: Due, Education has been provided regarding the importance of this vaccine. Advised may receive this vaccine at local pharmacy or Health Dept. Aware to provide a copy of the vaccination record if obtained from local pharmacy or Health Dept. Verbalized acceptance and understanding.  Flu Vaccine status: Up to date  Pneumococcal vaccine status: Up to date  Covid-19 vaccine status: Completed vaccines  Qualifies for Shingles Vaccine? Yes   Zostavax completed No   Shingrix Completed?: No.    Education has been provided regarding the importance of this vaccine. Patient has been advised to call insurance company to determine out of pocket expense if they have not yet received this vaccine. Advised may also receive vaccine at local pharmacy or Health Dept. Verbalized acceptance and understanding.  Screening Tests Health Maintenance  Topic Date Due   COVID-19 Vaccine (3 - Moderna risk series) 11/22/2021 (Originally 02/15/2021)   Zoster Vaccines- Shingrix (1 of 2) 02/06/2022 (Originally 10/07/1970)   TETANUS/TDAP  11/06/2022 (Originally 10/29/2021)   MAMMOGRAM  11/23/2021   FOOT EXAM  12/12/2021   DEXA SCAN  03/09/2022   HEMOGLOBIN A1C  05/06/2022   OPHTHALMOLOGY EXAM  08/04/2022   URINE MICROALBUMIN  08/04/2022   COLONOSCOPY (Pts 45-19yr Insurance coverage will need to be confirmed)  09/23/2023   Pneumonia Vaccine 70 Years old  Completed  INFLUENZA VACCINE  Completed   Hepatitis C Screening  Completed   HPV VACCINES  Aged Out    Health Maintenance  There are no preventive care reminders to display for this patient.  Colorectal cancer screening: Type of screening: Colonoscopy. Completed 09/22/2018. Repeat every 5 years  Mammogram status: Completed 12/22/2020. Repeat every year  Bone Density status: Completed 03/09/2020. Results reflect: Bone density  results: OSTEOPENIA. Repeat every 2 years.  Lung Cancer Screening: (Low Dose CT Chest recommended if Age 16-80 years, 30 pack-year currently smoking OR have quit w/in 15years.) does not qualify.   Additional Screening:  Hepatitis C Screening: does qualify; Completed 11/17/2015  Vision Screening: Recommended annual ophthalmology exams for early detection of glaucoma and other disorders of the eye. Is the patient up to date with their annual eye exam?  Yes  Who is the provider or what is the name of the office in which the patient attends annual eye exams? [place] If pt is not established with a provider, would they like to be referred to a provider to establish care? No .   Dental Screening: Recommended annual dental exams for proper oral hygiene  Community Resource Referral / Chronic Care Management: CRR required this visit?  No   CCM required this visit?  No      Plan:     I have personally reviewed and noted the following in the patient's chart:   Medical and social history Use of alcohol, tobacco or illicit drugs  Current medications and supplements including opioid prescriptions.  Functional ability and status Nutritional status Physical activity Advanced directives List of other physicians Hospitalizations, surgeries, and ER visits in previous 12 months Vitals Screenings to include cognitive, depression, and falls Referrals and appointments  In addition, I have reviewed and discussed with patient certain preventive protocols, quality metrics, and best practice recommendations. A written personalized care plan for preventive services as well as general preventive health recommendations were provided to patient.     Sandrea Hammond, LPN   [id number]   Nurse Notes: She's concerned about her memory; can read a book and forget what she read 5 minutes ago, forgets what people said as soon as she hangs up the phone, takes her 10 minutes to remember names at times;  6CIT score = 4; she did have to keep going back and forth to get the reverse months correct.

## 2021-11-22 ENCOUNTER — Ambulatory Visit (INDEPENDENT_AMBULATORY_CARE_PROVIDER_SITE_OTHER): Payer: PPO

## 2021-11-22 DIAGNOSIS — I495 Sick sinus syndrome: Secondary | ICD-10-CM | POA: Diagnosis not present

## 2021-11-22 LAB — CUP PACEART REMOTE DEVICE CHECK
Battery Impedance: 666 Ohm
Battery Remaining Longevity: 88 mo
Battery Voltage: 2.77 V
Brady Statistic AP VP Percent: 0 %
Brady Statistic AP VS Percent: 0 %
Brady Statistic AS VP Percent: 0 %
Brady Statistic AS VS Percent: 100 %
Date Time Interrogation Session: 20221207113435
Implantable Lead Implant Date: 20050914
Implantable Lead Implant Date: 20050914
Implantable Lead Location: 753859
Implantable Lead Location: 753860
Implantable Lead Model: 5076
Implantable Lead Model: 5076
Implantable Pulse Generator Implant Date: 20150605
Lead Channel Impedance Value: 502 Ohm
Lead Channel Impedance Value: 536 Ohm
Lead Channel Pacing Threshold Amplitude: 0.375 V
Lead Channel Pacing Threshold Amplitude: 0.75 V
Lead Channel Pacing Threshold Pulse Width: 0.4 ms
Lead Channel Pacing Threshold Pulse Width: 0.4 ms
Lead Channel Setting Pacing Amplitude: 2 V
Lead Channel Setting Pacing Amplitude: 2.5 V
Lead Channel Setting Pacing Pulse Width: 0.4 ms
Lead Channel Setting Sensing Sensitivity: 5.6 mV

## 2021-12-01 NOTE — Progress Notes (Signed)
Remote pacemaker transmission.   

## 2022-01-09 ENCOUNTER — Ambulatory Visit: Payer: PPO | Admitting: Internal Medicine

## 2022-01-09 ENCOUNTER — Encounter: Payer: Self-pay | Admitting: Internal Medicine

## 2022-01-09 ENCOUNTER — Other Ambulatory Visit: Payer: Self-pay

## 2022-01-09 VITALS — BP 142/82 | HR 75 | Ht 64.0 in | Wt 160.0 lb

## 2022-01-09 DIAGNOSIS — Z950 Presence of cardiac pacemaker: Secondary | ICD-10-CM | POA: Diagnosis not present

## 2022-01-09 DIAGNOSIS — I152 Hypertension secondary to endocrine disorders: Secondary | ICD-10-CM

## 2022-01-09 DIAGNOSIS — R001 Bradycardia, unspecified: Secondary | ICD-10-CM | POA: Diagnosis not present

## 2022-01-09 DIAGNOSIS — E1159 Type 2 diabetes mellitus with other circulatory complications: Secondary | ICD-10-CM

## 2022-01-09 NOTE — Patient Instructions (Signed)
Medication Instructions:  Your physician recommends that you continue on your current medications as directed. Please refer to the Current Medication list given to you today.  Labwork: None ordered.  Testing/Procedures: None ordered.  Follow-Up: Your physician wants you to follow-up in: one year with Cristopher Peru, MD or one of the following Advanced Practice Providers on your designated Care Team:   Tommye Standard, [place] Legrand Como "Jonni Sanger" Chalmers Cater, [place]  Remote monitoring is used to monitor your Pacemaker from home. This monitoring reduces the number of office visits required to check your device to one time per year. It allows [place] to keep an eye on the functioning of your device to ensure it is working properly. You are scheduled for a device check from home on 02/21/2022. You may send your transmission at any time that day. If you have a wireless device, the transmission will be sent automatically. After your physician reviews your transmission, you will receive a postcard with your next transmission date.  Any Other Special Instructions Will Be Listed Below (If Applicable).  If you need a refill on your cardiac medications before your next appointment, please call your pharmacy.

## 2022-01-09 NOTE — Progress Notes (Signed)
HPI Mrs. Mejorado returns today for followup. She is a pleasant 71 yo woman with HTN, sinus node dysfunction s/p PPM insertion who has had a stroke over a year ago. She has recovered and was noted not to have atrial fib on her device interrogation. She denies chest pain or sob. No edema. She has recovered from Bell's palsy.  Allergies  Allergen Reactions   Glipizide Itching   Pravachol [Pravastatin Sodium] Other (See Comments)    Stomach pain and constipation      Current Outpatient Medications  Medication Sig Dispense Refill   aspirin EC 81 MG EC tablet Take 1 tablet (81 mg total) by mouth daily. 30 tablet 0   Blood Glucose Monitoring Suppl (ONE TOUCH ULTRA 2) w/Device KIT 1 Device by Does not apply route daily. 1 each 0   BYSTOLIC 5 MG tablet TAKE (1) TABLET TWICE A DAY. 60 tablet 3   calcium citrate-vitamin D (CITRACAL+D) 315-200 MG-UNIT per tablet Take 1 tablet by mouth daily.     diclofenac Sodium (VOLTAREN) 1 % GEL Apply 4 g topically 4 (four) times daily. 400 g PRN   ezetimibe (ZETIA) 10 MG tablet Take 1 tablet (10 mg total) by mouth daily. 90 tablet 3   glucose blood test strip Use to check BG up to qd 100 each 2   metFORMIN (GLUCOPHAGE) 500 MG tablet Take 1 tablet (500 mg total) by mouth daily with supper. 90 tablet 3   ONETOUCH DELICA LANCETS FINE MISC Use to check BG up to qd 100 each 2   pantoprazole (PROTONIX) 40 MG tablet Take 1 tablet (40 mg total) by mouth daily. 90 tablet 3   rosuvastatin (CRESTOR) 10 MG tablet Take 1 tablet 2 days per week. 24 tablet 3   sertraline (ZOLOFT) 50 MG tablet Take 1 tablet (50 mg total) by mouth daily. 90 tablet 3   No current facility-administered medications for this visit.     Past Medical History:  Diagnosis Date   Anxiety    Arthritis    of the neck   Colon polyps    Diabetes mellitus without complication ([place])    Esophageal stricture    Essential hypertension 01/05/2020   GERD (gastroesophageal reflux disease)    Hiatal  Hernia   History of uterine cancer    Hyperlipidemia    Hypertension    Osteoporosis    Pacemaker    Symptomatic bradycardia     ROS:   All systems reviewed and negative except as noted in the HPI.   Past Surgical History:  Procedure Laterality Date   APPENDECTOMY     CHOLECYSTECTOMY     LAPAROSCOPIC TOTAL HYSTERECTOMY     LYMPH NODE DISSECTION     bilateral pelvic and right-sided periaortic   PACEMAKER GENERATOR CHANGE N/A 05/21/2014   Procedure: PACEMAKER GENERATOR CHANGE;  Surgeon: Evans Lance, MD;  Location[facility] CATH LAB;  Service: Cardiovascular;  Laterality: N/A;   status post pacemaker     metronic [id number]   TRANSTHORACIC ECHOCARDIOGRAM  08/30/04   TUBAL LIGATION       Family History  Problem Relation Age of Onset   Heart disease Mother    Heart attack Mother    Heart disease Father    Heart attack Father    Cancer Sister        breast   Breast cancer Sister    Alcohol abuse Brother    COPD Sister    Healthy Daughter  Social History   Socioeconomic History   Marital status: Significant Other    Spouse name: Not on file   Number of children: 1   Years of education: GED   Highest education level: GED or equivalent  Occupational History   Occupation: retired    Comment: Engineer, manufacturing systems  Tobacco Use   Smoking status: Never   Smokeless tobacco: Never  Scientific laboratory technician Use: Never used  Substance and Sexual Activity   Alcohol use: No    Comment: may have a social drink rarely   Drug use: No   Sexual activity: Not Currently  Other Topics Concern   Not on file  Social History Narrative   Not on file   Social Determinants of Health   Financial Resource Strain: Low Risk    Difficulty of Paying Living Expenses: Not hard at all  Food Insecurity: No Food Insecurity   Worried About Charity fundraiser in the Last Year: Never true   [place] in the Last Year: Never true  Transportation Needs: No Transportation Needs   Lack of  Transportation (Medical): No   Lack of Transportation (Non-Medical): No  Physical Activity: Sufficiently Active   Days of Exercise per Week: 5 days   Minutes of Exercise per Session: 30 min  Stress: No Stress Concern Present   Feeling of Stress : Only a little  Social Connections: Engineer, building services of Communication with Friends and Family: More than three times a week   Frequency of Social Gatherings with Friends and Family: More than three times a week   Attends Religious Services: 1 to 4 times per year   Active Member of [employer] or Organizations: Yes   Attends Archivist Meetings: 1 to 4 times per year   Marital Status: Living with partner  Intimate Partner Violence: Not At Risk   Fear of Current or Ex-Partner: No   Emotionally Abused: No   Physically Abused: No   Sexually Abused: No     BP (!) 142/82    Pulse 75    Ht _0  (1.626 m)    Wt 160 lb (72.6 kg)    SpO2 98%    BMI 27.46 kg/m   Physical Exam:  Well appearing NAD HEENT: Unremarkable Neck:  No JVD, no thyromegally Lymphatics:  No adenopathy Back:  No CVA tenderness Lungs:  Clear with no wheezes HEART:  Regular rate rhythm, no murmurs, no rubs, no clicks Abd:  soft, positive bowel sounds, no organomegally, no rebound, no guarding Ext:  2 plus pulses, no edema, no cyanosis, no clubbing Skin:  No rashes no nodules Neuro:  CN II through XII intact, motor grossly intact  EKG - nsr  DEVICE  Normal device function.  See PaceArt for details.   Assess/Plan:   1. Sinus node dysfunction - she is asymptomatic s/p PPM insertion.  2. 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 We will recheck in several months. 3. HTN - her SBP is elevated and I have encouraged her to reduce her sodium intake. She notes that her bp has been better at home.  Carleene Overlie Joscelynn Brutus,MD

## 2022-01-25 LAB — HM DIABETES EYE EXAM

## 2022-02-05 ENCOUNTER — Other Ambulatory Visit: Payer: Self-pay

## 2022-02-05 ENCOUNTER — Other Ambulatory Visit: Payer: Self-pay | Admitting: Family Medicine

## 2022-02-05 ENCOUNTER — Other Ambulatory Visit: Payer: PPO

## 2022-02-05 DIAGNOSIS — I152 Hypertension secondary to endocrine disorders: Secondary | ICD-10-CM

## 2022-02-05 DIAGNOSIS — E1169 Type 2 diabetes mellitus with other specified complication: Secondary | ICD-10-CM | POA: Diagnosis not present

## 2022-02-05 DIAGNOSIS — E785 Hyperlipidemia, unspecified: Secondary | ICD-10-CM | POA: Diagnosis not present

## 2022-02-05 DIAGNOSIS — E1159 Type 2 diabetes mellitus with other circulatory complications: Secondary | ICD-10-CM

## 2022-02-05 DIAGNOSIS — Z1231 Encounter for screening mammogram for malignant neoplasm of breast: Secondary | ICD-10-CM

## 2022-02-05 LAB — BAYER DCA HB A1C WAIVED: HB A1C (BAYER DCA - WAIVED): 6.7 % — ABNORMAL HIGH (ref 4.8–5.6)

## 2022-02-06 ENCOUNTER — Other Ambulatory Visit: Payer: Self-pay | Admitting: Family Medicine

## 2022-02-06 DIAGNOSIS — E1169 Type 2 diabetes mellitus with other specified complication: Secondary | ICD-10-CM

## 2022-02-06 DIAGNOSIS — E785 Hyperlipidemia, unspecified: Secondary | ICD-10-CM

## 2022-02-06 DIAGNOSIS — E781 Pure hyperglyceridemia: Secondary | ICD-10-CM

## 2022-02-06 LAB — LIPID PANEL
Chol/HDL Ratio: 4.7 ratio — ABNORMAL HIGH (ref 0.0–4.4)
Cholesterol, Total: 232 mg/dL — ABNORMAL HIGH (ref 100–199)
HDL: 49 mg/dL (ref >39)
LDL Chol Calc (NIH): 138 mg/dL — ABNORMAL HIGH (ref 0–99)
Triglycerides: 249 mg/dL — ABNORMAL HIGH (ref 0–149)
VLDL Cholesterol Cal: 45 mg/dL — ABNORMAL HIGH (ref 5–40)

## 2022-02-06 LAB — HEPATIC FUNCTION PANEL
ALT: 9 IU/L (ref 0–32)
AST: 12 IU/L (ref 0–40)
Albumin: 4.2 g/dL (ref 3.8–4.8)
Alkaline Phosphatase: 98 IU/L (ref 44–121)
Bilirubin Total: 0.4 mg/dL (ref 0.0–1.2)
Bilirubin, Direct: 0.12 mg/dL (ref 0.00–0.40)
Total Protein: 6.7 g/dL (ref 6.0–8.5)

## 2022-02-06 MED ORDER — ICOSAPENT ETHYL 1 G PO CAPS: 2.0000 g | ORAL_CAPSULE | Freq: Two times a day (BID) | ORAL | 12 refills | Status: DC

## 2022-02-07 ENCOUNTER — Ambulatory Visit
Admission: RE | Admit: 2022-02-07 | Discharge: 2022-02-07 | Disposition: A | Discharge status: Home / Self Care | Payer: PPO | Source: Ambulatory Visit | Attending: Family Medicine | Admitting: Family Medicine

## 2022-02-07 ENCOUNTER — Encounter: Payer: Self-pay | Admitting: Family Medicine

## 2022-02-07 ENCOUNTER — Ambulatory Visit (INDEPENDENT_AMBULATORY_CARE_PROVIDER_SITE_OTHER): Payer: PPO | Admitting: Family Medicine

## 2022-02-07 ENCOUNTER — Other Ambulatory Visit: Payer: Self-pay

## 2022-02-07 VITALS — BP 135/83 | HR 68 | Temp 98.7°F | Ht 64.0 in | Wt 159.8 lb

## 2022-02-07 DIAGNOSIS — N904 Leukoplakia of vulva: Secondary | ICD-10-CM | POA: Diagnosis not present

## 2022-02-07 DIAGNOSIS — I152 Hypertension secondary to endocrine disorders: Secondary | ICD-10-CM | POA: Diagnosis not present

## 2022-02-07 DIAGNOSIS — E1159 Type 2 diabetes mellitus with other circulatory complications: Secondary | ICD-10-CM | POA: Diagnosis not present

## 2022-02-07 DIAGNOSIS — E1169 Type 2 diabetes mellitus with other specified complication: Secondary | ICD-10-CM | POA: Diagnosis not present

## 2022-02-07 DIAGNOSIS — Z23 Encounter for immunization: Secondary | ICD-10-CM

## 2022-02-07 DIAGNOSIS — E785 Hyperlipidemia, unspecified: Secondary | ICD-10-CM | POA: Diagnosis not present

## 2022-02-07 DIAGNOSIS — Z1231 Encounter for screening mammogram for malignant neoplasm of breast: Secondary | ICD-10-CM

## 2022-02-07 MED ORDER — CLOBETASOL PROPIONATE 0.05 % EX OINT
TOPICAL_OINTMENT | CUTANEOUS | 99 refills | Status: DC
Start: 2022-02-07 — End: 2022-08-16

## 2022-02-07 NOTE — Patient Instructions (Addendum)
Stop Zetia Start Vascepa Repeat fasting labs in 3 months Start clobetasol ointment nightly for 6 weeks, then ok to reduce to 2-3 times per week if symptoms are controlled.  Lichen Sclerosus Lichen sclerosus is a skin problem. It can happen on any part of the body, but it commonly involves the anal and genital areas. It can cause itching and discomfort in these areas. Treatment can help to control symptoms. When the genital area is affected, getting treatment is important because the condition can cause scarring that may lead to other problems if left untreated. What are the causes? The cause of this condition is not known. It may be related to an overactive immune system or a lack of certain hormones. Lichen sclerosus is not an infection or a fungus, and it is not passed from one person to another (non-contagious). What increases the risk? The following factors may make you more likely to develop this condition: You are a woman who has reached menopause. You are a man who was not circumcised. This condition may also develop for the first time in children, usually before they enter puberty. What are the signs or symptoms? Symptoms of this condition include: White areas (plaques) on the skin that may be thin and wrinkled, or thickened. Red and swollen patches (lesions) on the skin. Tears or cracks in the skin. Bruising. Blood blisters. Severe itching. Pain, itching, or burning when urinating. Constipation is also common in children with lichen sclerosus, but can be seen in adults. How is this diagnosed? This condition may be diagnosed with a physical exam. In some cases, a tissue sample may be removed to be checked under a microscope (biopsy). How is this treated? This condition may be treated with: Topical steroids. These are medicated creams or ointments that are applied over the affected areas. Medicines that are taken by mouth. Topical immunotherapy. These are medicated creams or  ointments that are applied over the affected areas. They stimulate your immune system to fight the skin condition. This may be used if steroids are not effective. Surgery. This may be needed in more severe cases that are causing problems such as scarring. Follow these instructions at home: Medicines Take over-the-counter and prescription medicines only as told by your health care provider. Use creams or ointments as told by your health care provider. Skin care Do not scratch the affected areas of skin. If you are a woman, be sure to keep the vaginal area as clean and dry as possible. Clean the affected area of skin gently with water only. Pat skin dry and avoid the use of rough towels or toilet paper. Avoid irritating skin products, including soap and scented lotions. Use emollient creams as directed by your health care provider to help reduce itching. General instructions Keep all follow-up visits. This is important. Your condition may cause constipation. To prevent or treat constipation, you may need to: Drink enough fluid to keep your urine pale yellow. Take over-the-counter or prescription medicines. Eat foods that are high in fiber, such as beans, whole grains, and fresh fruits and vegetables. Limit foods that are high in fat and processed sugars, such as fried or sweet foods. Contact a health care provider if: You have increasing redness, swelling, or pain in the affected area. You have fluid, blood, or pus coming from the affected area. You have new lesions on your skin. You have a fever. You have pain during sex. Get help right away if: You develop severe pain or burning in the affected  areas, especially in the genital area. Summary Lichen sclerosus is a skin problem. When the genital area is affected, getting treatment is important because the condition can cause scarring that may lead to other problems if left untreated. This condition is usually treated with medicated creams or  ointments (topical steroids) that are applied over the affected areas. Take or use over-the-counter and prescription medicines only as told by your health care provider. Contact a health care provider if you have new lesions on your skin, have pain during sex, or have increasing redness, swelling, or pain in the affected area. Keep all follow-up visits. This is important. This information is not intended to replace advice given to you by your health care provider. Make sure you discuss any questions you have with your health care provider. Document Revised: 04/16/2020 Document Reviewed: 04/16/2020 Elsevier Patient Education  [place].

## 2022-02-07 NOTE — Progress Notes (Signed)
Subjective: CC: Follow-up diabetes, hyperlipidemia PCP: Janora Norlander, DO [id number] [name] is a 71 y.o. female presenting to clinic today for:  1. Type 2 Diabetes with hypertension, hyperlipidemia:  Her Bystolic is recently gone generic and she worries about transitioning over to generic.she is compliant with metformin 500 mg daily with supper, her Zetia.  Unfortunately her cholesterol panel did not show any significant improvement in her levels with the Zetia.  Her 10-year risk for for cardiovascular event remains above 29%.  She denies any chest pain, shortness of breath, visual disturbance or edema.  Last eye exam: Up-to-date Last foot exam: Needs Last A1c:  Lab Results  Component Value Date   HGBA1C 6.7 (H) 02/05/2022   Nephropathy screen indicated?:  Up-to-date compliant with medications.   Last flu, zoster and/or pneumovax:  Immunization History  Administered Date(s) Administered   Fluad Quad(high Dose 65+) 09/07/2019, 11/06/2021   Influenza,inj,Quad PF,6+ Mos 10/29/2016, 01/03/2018, 10/08/2018   Moderna SARS-COV2 Booster Vaccination 01/18/2021   Moderna Sars-Covid-2 Vaccination 02/25/2020, 03/29/2020   Pneumococcal Conjugate-13 08/02/2017   Pneumococcal Polysaccharide-23 08/04/2018   Tdap 10/30/2011   Zoster Recombinat (Shingrix) 02/07/2022   2.  Vaginitis Patient reports ongoing vaginal irritation and itching.  She has been utilizing over-the-counter yeast medications.  She has some vaginal discharge but nothing that looks to be a yeast infection per se.  She has suffered with vaginal itching for years.  Denies any bleeding.   ROS: Per HPI  Allergies  Allergen Reactions   Glipizide Itching   Pravachol [Pravastatin Sodium] Other (See Comments)    Stomach pain and constipation    Past Medical History:  Diagnosis Date   Anxiety    Arthritis    of the neck   Colon polyps    Diabetes mellitus without complication (HCC)    Esophageal stricture     Essential hypertension 01/05/2020   GERD (gastroesophageal reflux disease)    Hiatal Hernia   History of uterine cancer    Hyperlipidemia    Hypertension    Osteoporosis    Pacemaker    Symptomatic bradycardia     Current Outpatient Medications:    aspirin EC 81 MG EC tablet, Take 1 tablet (81 mg total) by mouth daily., Disp: 30 tablet, Rfl: 0   Blood Glucose Monitoring Suppl (ONE TOUCH ULTRA 2) w/Device KIT, 1 Device by Does not apply route daily., Disp: 1 each, Rfl: 0   BYSTOLIC 5 MG tablet, TAKE (1) TABLET TWICE A DAY., Disp: 60 tablet, Rfl: 3   calcium citrate-vitamin D (CITRACAL+D) 315-200 MG-UNIT per tablet, Take 1 tablet by mouth daily., Disp: , Rfl:    diclofenac Sodium (VOLTAREN) 1 % GEL, Apply 4 g topically 4 (four) times daily., Disp: 400 g, Rfl: PRN   ezetimibe (ZETIA) 10 MG tablet, Take 1 tablet (10 mg total) by mouth daily., Disp: 90 tablet, Rfl: 3   glucose blood test strip, Use to check BG up to qd, Disp: 100 each, Rfl: 2   icosapent Ethyl (VASCEPA) 1 g capsule, Take 2 capsules (2 g total) by mouth 2 (two) times daily., Disp: 120 capsule, Rfl: 12   metFORMIN (GLUCOPHAGE) 500 MG tablet, Take 1 tablet (500 mg total) by mouth daily with supper., Disp: 90 tablet, Rfl: 3   ONETOUCH DELICA LANCETS FINE MISC, Use to check BG up to qd, Disp: 100 each, Rfl: 2   pantoprazole (PROTONIX) 40 MG tablet, Take 1 tablet (40 mg total) by mouth daily., Disp: 90 tablet, Rfl: 3  sertraline (ZOLOFT) 50 MG tablet, Take 1 tablet (50 mg total) by mouth daily., Disp: 90 tablet, Rfl: 3 Social History   Socioeconomic History   Marital status: Significant Other    Spouse name: Not on file   Number of children: 1   Years of education: GED   Highest education level: GED or equivalent  Occupational History   Occupation: retired    Comment: Engineer, manufacturing systems  Tobacco Use   Smoking status: Never   Smokeless tobacco: Never  Scientific laboratory technician Use: Never used  Substance and Sexual Activity    Alcohol use: No    Comment: may have a social drink rarely   Drug use: No   Sexual activity: Not Currently  Other Topics Concern   Not on file  Social History Narrative   Not on file   Social Determinants of Health   Financial Resource Strain: Low Risk    Difficulty of Paying Living Expenses: Not hard at all  Food Insecurity: No Food Insecurity   Worried About Charity fundraiser in the Last Year: Never true   [place] in the Last Year: Never true  Transportation Needs: No Transportation Needs   Lack of Transportation (Medical): No   Lack of Transportation (Non-Medical): No  Physical Activity: Sufficiently Active   Days of Exercise per Week: 5 days   Minutes of Exercise per Session: 30 min  Stress: No Stress Concern Present   Feeling of Stress : Only a little  Social Connections: Engineer, building services of Communication with Friends and Family: More than three times a week   Frequency of Social Gatherings with Friends and Family: More than three times a week   Attends Religious Services: 1 to 4 times per year   Active Member of [employer] or Organizations: Yes   Attends Archivist Meetings: 1 to 4 times per year   Marital Status: Living with partner  Intimate Partner Violence: Not At Risk   Fear of Current or Ex-Partner: No   Emotionally Abused: No   Physically Abused: No   Sexually Abused: No   Family History  Problem Relation Age of Onset   Heart disease Mother    Heart attack Mother    Heart disease Father    Heart attack Father    Cancer Sister        breast   Breast cancer Sister    Alcohol abuse Brother    COPD Sister    Healthy Daughter     Objective: Office vital signs reviewed. BP 135/83    Pulse 68    Temp 98.7 F (37.1 C)    Ht '5\' 4"'  (1.626 m)    Wt 159 lb 12.8 oz (72.5 kg)    SpO2 97%    BMI 27.43 kg/m   Physical Examination:  General: Awake, alert, well nourished, No acute distress HEENT sclera white.  Moist mucous  membranes Cardio: regular rate and rhythm  Pulm:  normal work of breathing on room air GU: Atrophic vaginal mucosa.  She does have some sclerotic changes to the base of the vagina.  No significant vaginal discharge appreciated  Assessment/ Plan: 71 y.o. female   Lichen sclerosus of female genitalia - Plan: clobetasol ointment (TEMOVATE) 0.05 %  Controlled type 2 diabetes mellitus with other specified complication, without long-term current use of insulin ([place]) - Plan: Bayer DCA Hb A1c Waived  Hyperlipidemia associated with type 2 diabetes mellitus ([place]) - Plan: Lipid  panel, Hepatic function panel  Hypertension associated with diabetes ([place])  Her vaginal symptoms are clinically consistent with a lichen sclerosus of the genitalia.  We will trial her on clobetasol ointment nightly for 6 to 12 weeks and then 2-3 times per week thereafter for maintenance.  Would like to reevaluate her in 3 months, sooner if concerns arise  Sugar is well controlled.  Continue current regimen with metformin.  Due for diabetic eye exam, which we will perform at next visit  Her lipid panel was relatively unchanged, if not slightly worse from an LDL standpoint, compared to her check in November.  This is despite compliance with her Zetia.  I have since discontinued and transitioned her over to [place].  May need to consider Nexletol but I fear that that may be unaffordable.  I calculated her ASCVD risk score which was 29.1%.  Need to find something that is going to reduce her risk.  Future orders for lipid and liver function panel placed.  She understands to be fasting for that appointment.  In the meantime Mediterranean diet information is also been provided  Blood pressure well controlled.  No changes.  Shingles vaccination administered  Orders Placed This Encounter  Procedures   Varicella-zoster vaccine IM (Shingrix)   No orders of the defined types were placed in this encounter.    Janora Norlander,  DO [facility] [place] [phone]

## 2022-02-09 ENCOUNTER — Telehealth: Payer: Self-pay | Admitting: Family Medicine

## 2022-02-09 NOTE — Telephone Encounter (Signed)
LMTCB

## 2022-02-21 ENCOUNTER — Ambulatory Visit (INDEPENDENT_AMBULATORY_CARE_PROVIDER_SITE_OTHER): Payer: PPO

## 2022-02-21 DIAGNOSIS — I495 Sick sinus syndrome: Secondary | ICD-10-CM

## 2022-02-22 LAB — CUP PACEART REMOTE DEVICE CHECK
Battery Impedance: 767 Ohm
Battery Remaining Longevity: 82 mo
Battery Voltage: 2.77 V
Brady Statistic AP VP Percent: 0 %
Brady Statistic AP VS Percent: 0 %
Brady Statistic AS VP Percent: 0 %
Brady Statistic AS VS Percent: 100 %
Date Time Interrogation Session: 20230309103140
Implantable Lead Implant Date: 20050914
Implantable Lead Implant Date: 20050914
Implantable Lead Location: 753859
Implantable Lead Location: 753860
Implantable Lead Model: 5076
Implantable Lead Model: 5076
Implantable Pulse Generator Implant Date: 20150605
Lead Channel Impedance Value: 510 Ohm
Lead Channel Impedance Value: 555 Ohm
Lead Channel Pacing Threshold Amplitude: 0.375 V
Lead Channel Pacing Threshold Amplitude: 0.75 V
Lead Channel Pacing Threshold Pulse Width: 0.4 ms
Lead Channel Pacing Threshold Pulse Width: 0.4 ms
Lead Channel Setting Pacing Amplitude: 2 V
Lead Channel Setting Pacing Amplitude: 2.5 V
Lead Channel Setting Pacing Pulse Width: 0.4 ms
Lead Channel Setting Sensing Sensitivity: 5.6 mV

## 2022-03-06 NOTE — Progress Notes (Signed)
Remote pacemaker transmission.   

## 2022-03-16 NOTE — Telephone Encounter (Signed)
Attempts to contact pt without a return call in over 3 days, will close encounter. °

## 2022-03-22 ENCOUNTER — Other Ambulatory Visit: Payer: Self-pay | Admitting: Internal Medicine

## 2022-03-22 DIAGNOSIS — I10 Essential (primary) hypertension: Secondary | ICD-10-CM

## 2022-03-23 MED ORDER — BYSTOLIC 5 MG PO TABS: ORAL_TABLET | ORAL | 3 refills | Status: DC

## 2022-05-09 ENCOUNTER — Encounter: Payer: Self-pay | Admitting: Family Medicine

## 2022-05-09 ENCOUNTER — Ambulatory Visit (INDEPENDENT_AMBULATORY_CARE_PROVIDER_SITE_OTHER): Payer: PPO | Admitting: Family Medicine

## 2022-05-09 VITALS — BP 138/76 | HR 97 | Temp 97.3°F | Wt 159.0 lb

## 2022-05-09 DIAGNOSIS — Z23 Encounter for immunization: Secondary | ICD-10-CM

## 2022-05-09 DIAGNOSIS — E785 Hyperlipidemia, unspecified: Secondary | ICD-10-CM

## 2022-05-09 DIAGNOSIS — E1159 Type 2 diabetes mellitus with other circulatory complications: Secondary | ICD-10-CM

## 2022-05-09 DIAGNOSIS — I152 Hypertension secondary to endocrine disorders: Secondary | ICD-10-CM

## 2022-05-09 DIAGNOSIS — E1169 Type 2 diabetes mellitus with other specified complication: Secondary | ICD-10-CM

## 2022-05-09 DIAGNOSIS — N904 Leukoplakia of vulva: Secondary | ICD-10-CM

## 2022-05-09 LAB — BAYER DCA HB A1C WAIVED: HB A1C (BAYER DCA - WAIVED): 6.7 % — ABNORMAL HIGH (ref 4.8–5.6)

## 2022-05-09 NOTE — Progress Notes (Signed)
Subjective: CC: Follow-up vaginitis, diabetes PCP: Janora Norlander, DO [name] is a 71 y.o. female presenting to clinic today for:  1.  Vaginitis Patient was clinically suspected to have lichen sclerosis in February.  She was placed on clobetasol topically for 6 to 12 weeks and advised to reduce down to 2-3 times per week after that for maintenance.  She is here for 56-monthfollow-up and notes that symptoms have been really unchanged.  She feels like the base of the vagina has gotten slightly better but the vaginal lips really have not gotten any better.  She admits that she has not been utilizing clobetasol regularly but rather as needed.  2. Type 2 Diabetes with hypertension, hyperlipidemia:  She reports compliance with metformin 5309mg daily, Bystolic 5 mg twice daily and Vascepa 2 g twice daily.  Last eye exam: UTD Last foot exam: needs Last A1c:  Lab Results  Component Value Date   HGBA1C 6.7 (H) 02/05/2022   Nephropathy screen indicated?: UTD Last flu, zoster and/or pneumovax:  Immunization History  Administered Date(s) Administered   Fluad Quad(high Dose 65+) 09/07/2019, 11/06/2021   Influenza,inj,Quad PF,6+ Mos 10/29/2016, 01/03/2018, 10/08/2018   Moderna SARS-COV2 Booster Vaccination 01/18/2021   Moderna Sars-Covid-2 Vaccination 02/25/2020, 03/29/2020   Pneumococcal Conjugate-13 08/02/2017   Pneumococcal Polysaccharide-23 08/04/2018   Tdap 10/30/2011   Zoster Recombinat (Shingrix) 02/07/2022    ROS: No chest pain, shortness of breath.  She does occasionally have pins-and-needles type pain in her feet but this is not frequent.  ROS: Per HPI  Allergies  Allergen Reactions   Glipizide Itching   Pravachol [Pravastatin Sodium] Other (See Comments)    Stomach pain and constipation    Past Medical History:  Diagnosis Date   Anxiety    Arthritis    of the neck   Colon polyps    Diabetes mellitus without complication (HCC)    Esophageal stricture     Essential hypertension 01/05/2020   GERD (gastroesophageal reflux disease)    Hiatal Hernia   History of uterine cancer    Hyperlipidemia    Hypertension    Osteoporosis    Pacemaker    Symptomatic bradycardia     Current Outpatient Medications:    aspirin EC 81 MG EC tablet, Take 1 tablet (81 mg total) by mouth daily., Disp: 30 tablet, Rfl: 0   Blood Glucose Monitoring Suppl (ONE TOUCH ULTRA 2) w/Device KIT, 1 Device by Does not apply route daily., Disp: 1 each, Rfl: 0   BYSTOLIC 5 MG tablet, TAKE (1) TABLET TWICE A DAY., Disp: 180 tablet, Rfl: 3   calcium citrate-vitamin D (CITRACAL+D) 315-200 MG-UNIT per tablet, Take 1 tablet by mouth daily., Disp: , Rfl:    clobetasol ointment (TEMOVATE) 04.07%, Apply 1 application topically at bedtime for 42 days, THEN 1 application 2 (two) times a week., Disp: 60 g, Rfl: PRN   diclofenac Sodium (VOLTAREN) 1 % GEL, Apply 4 g topically 4 (four) times daily., Disp: 400 g, Rfl: PRN   glucose blood test strip, Use to check BG up to qd, Disp: 100 each, Rfl: 2   icosapent Ethyl (VASCEPA) 1 g capsule, Take 2 capsules (2 g total) by mouth 2 (two) times daily., Disp: 120 capsule, Rfl: 12   metFORMIN (GLUCOPHAGE) 500 MG tablet, Take 1 tablet (500 mg total) by mouth daily with supper., Disp: 90 tablet, Rfl: 3   ONETOUCH DELICA LANCETS FINE MISC, Use to check BG up to qd, Disp: 100 each,  Rfl: 2   pantoprazole (PROTONIX) 40 MG tablet, Take 1 tablet (40 mg total) by mouth daily., Disp: 90 tablet, Rfl: 3   sertraline (ZOLOFT) 50 MG tablet, Take 1 tablet (50 mg total) by mouth daily., Disp: 90 tablet, Rfl: 3 Social History   Socioeconomic History   Marital status: Significant Other    Spouse name: Not on file   Number of children: 1   Years of education: GED   Highest education level: GED or equivalent  Occupational History   Occupation: retired    Comment: Engineer, manufacturing systems  Tobacco Use   Smoking status: Never   Smokeless tobacco: Never  Brewing technologist Use: Never used  Substance and Sexual Activity   Alcohol use: No    Comment: may have a social drink rarely   Drug use: No   Sexual activity: Not Currently  Other Topics Concern   Not on file  Social History Narrative   Not on file   Social Determinants of Health   Financial Resource Strain: Low Risk    Difficulty of Paying Living Expenses: Not hard at all  Food Insecurity: No Food Insecurity   Worried About Charity fundraiser in the Last Year: Never true   [place] in the Last Year: Never true  Transportation Needs: No Transportation Needs   Lack of Transportation (Medical): No   Lack of Transportation (Non-Medical): No  Physical Activity: Sufficiently Active   Days of Exercise per Week: 5 days   Minutes of Exercise per Session: 30 min  Stress: No Stress Concern Present   Feeling of Stress : Only a little  Social Connections: Engineer, building services of Communication with Friends and Family: More than three times a week   Frequency of Social Gatherings with Friends and Family: More than three times a week   Attends Religious Services: 1 to 4 times per year   Active Member of [employer] or Organizations: Yes   Attends Archivist Meetings: 1 to 4 times per year   Marital Status: Living with partner  Intimate Partner Violence: Not At Risk   Fear of Current or Ex-Partner: No   Emotionally Abused: No   Physically Abused: No   Sexually Abused: No   Family History  Problem Relation Age of Onset   Heart disease Mother    Heart attack Mother    Heart disease Father    Heart attack Father    Cancer Sister        breast   Breast cancer Sister    Alcohol abuse Brother    COPD Sister    Healthy Daughter     Objective: Office vital signs reviewed. BP 138/76   Pulse 97   Temp (!) 97.3 F (36.3 C)   Wt 159 lb (72.1 kg)   SpO2 97%   BMI 27.29 kg/m   Physical Examination:  General: Awake, alert, well nourished, No acute distress HEENT:  Sclera white.  Moist mucous membranes Cardio: regular rate and rhythm, S1S2 heard, no murmurs appreciated Pulm: clear to auscultation bilaterally, no wheezes, rhonchi or rales; normal work of breathing on room air  Assessment/ Plan: 70 y.o. female   Controlled type 2 diabetes mellitus with other specified complication, without long-term current use of insulin ([place]) - Plan: Bayer DCA Hb A1c Waived  Hyperlipidemia associated with type 2 diabetes mellitus ([place]) - Plan: Hepatic function panel, Lipid panel  Hypertension associated with diabetes ([place])  Lichen sclerosus  of female genitalia  Sugar remains well controlled.  No changes  Check liver function panel, lipids.  Continue Vascepa  Blood pressure is controlled.  No changes  Uncertain if incompletely treated due to inadequate compliance with the topical regimen versus if we simply have the wrong diagnosis.  I did offer referral to gynecology but she declined this today would like to retry the cream  No orders of the defined types were placed in this encounter.  No orders of the defined types were placed in this encounter.    Janora Norlander, DO [place] [phone]

## 2022-05-10 LAB — HEPATIC FUNCTION PANEL
ALT: 10 IU/L (ref 0–32)
AST: 12 IU/L (ref 0–40)
Albumin: 4.4 g/dL (ref 3.8–4.8)
Alkaline Phosphatase: 93 IU/L (ref 44–121)
Bilirubin Total: 0.4 mg/dL (ref 0.0–1.2)
Bilirubin, Direct: 0.1 mg/dL (ref 0.00–0.40)
Total Protein: 6.8 g/dL (ref 6.0–8.5)

## 2022-05-10 LAB — LIPID PANEL
Chol/HDL Ratio: 3.2 ratio (ref 0.0–4.4)
Cholesterol, Total: 164 mg/dL (ref 100–199)
HDL: 52 mg/dL (ref >39)
LDL Chol Calc (NIH): 70 mg/dL (ref 0–99)
Triglycerides: 258 mg/dL — ABNORMAL HIGH (ref 0–149)
VLDL Cholesterol Cal: 42 mg/dL — ABNORMAL HIGH (ref 5–40)

## 2022-05-23 ENCOUNTER — Ambulatory Visit (INDEPENDENT_AMBULATORY_CARE_PROVIDER_SITE_OTHER): Payer: PPO

## 2022-05-23 DIAGNOSIS — I495 Sick sinus syndrome: Secondary | ICD-10-CM | POA: Diagnosis not present

## 2022-05-24 LAB — CUP PACEART REMOTE DEVICE CHECK
Battery Impedance: 867 Ohm
Battery Remaining Longevity: 77 mo
Battery Voltage: 2.77 V
Brady Statistic AP VP Percent: 0 %
Brady Statistic AP VS Percent: 0 %
Brady Statistic AS VP Percent: 0 %
Brady Statistic AS VS Percent: 100 %
Date Time Interrogation Session: 20230607151905
Implantable Lead Implant Date: 20050914
Implantable Lead Implant Date: 20050914
Implantable Lead Location: 753859
Implantable Lead Location: 753860
Implantable Lead Model: 5076
Implantable Lead Model: 5076
Implantable Pulse Generator Implant Date: 20150605
Lead Channel Impedance Value: 525 Ohm
Lead Channel Impedance Value: 578 Ohm
Lead Channel Pacing Threshold Amplitude: 0.375 V
Lead Channel Pacing Threshold Amplitude: 0.75 V
Lead Channel Pacing Threshold Pulse Width: 0.4 ms
Lead Channel Pacing Threshold Pulse Width: 0.4 ms
Lead Channel Setting Pacing Amplitude: 2 V
Lead Channel Setting Pacing Amplitude: 2.5 V
Lead Channel Setting Pacing Pulse Width: 0.4 ms
Lead Channel Setting Sensing Sensitivity: 5.6 mV

## 2022-06-01 NOTE — Progress Notes (Signed)
Remote pacemaker transmission.   

## 2022-06-26 ENCOUNTER — Ambulatory Visit: Payer: Self-pay | Admitting: *Deleted

## 2022-06-26 NOTE — Chronic Care Management (AMB) (Signed)
  Chronic Care Management   Note  06/26/2022 Name: [name] MRN: [id number] DOB: 1951-08-01   Patient has not recently engaged with the Chronic Care Management RN Care Manager. Removing RN Care Manager from Care Team and closing [place]. If patient is currently engaged with another CCM team member I will forward this encounter to inform them of my case closure. Patient may be eligible for re-engagement with RN Care Manager in the future if necessary and can discuss this with their PCP.  [name], BSN, RN-BC Embedded Chronic Care Manager [facility] Direct Dial: [phone]

## 2022-07-17 ENCOUNTER — Ambulatory Visit (INDEPENDENT_AMBULATORY_CARE_PROVIDER_SITE_OTHER): Payer: PPO | Admitting: Nurse Practitioner

## 2022-07-17 ENCOUNTER — Encounter: Payer: Self-pay | Admitting: Nurse Practitioner

## 2022-07-17 VITALS — BP 157/78 | HR 67 | Temp 98.6°F | Ht 64.0 in | Wt 160.0 lb

## 2022-07-17 DIAGNOSIS — R030 Elevated blood-pressure reading, without diagnosis of hypertension: Secondary | ICD-10-CM

## 2022-07-17 DIAGNOSIS — R609 Edema, unspecified: Secondary | ICD-10-CM

## 2022-07-17 DIAGNOSIS — R42 Dizziness and giddiness: Secondary | ICD-10-CM | POA: Diagnosis not present

## 2022-07-17 DIAGNOSIS — E1169 Type 2 diabetes mellitus with other specified complication: Secondary | ICD-10-CM

## 2022-07-17 LAB — GLUCOSE HEMOCUE WAIVED: Glu Hemocue Waived: 246 mg/dL — ABNORMAL HIGH (ref 70–99)

## 2022-07-17 NOTE — Patient Instructions (Signed)
Diabetes Insipidus Diabetes insipidus (DI) is a rare condition that causes the body to produce more urine than normal. This leads to thirst and low fluid in the body (dehydration). In this condition, the urine is made mostly of water, or dilute urine. DI affects mostly adults, but it can happen at any age. There are four types of DI: Central DI. This is the most common type. Dipsogenic DI. Nephrogenic DI. Gestational DI. The most common forms of this condition are caused by a decrease in the production of the hormone that regulates urine output (antidiuretic hormone), or the body's resistance to this hormone. This condition is not related to type 1 or type 2 diabetes mellitus. What are the causes? Central DI is caused by damage to the pituitary gland or hypothalamus in the brain. Dipsogenic DI is caused by a defect in the thirst mechanism in the brain. This defect causes you to drink too much fluid. These may result from: Brain surgery. Infection. Inflammation. Brain tumor. Head injury. Nephrogenic DI is caused by the kidneys not responding to the antidiuretic hormone in the body. This may result from: Chronic kidney disease (CKD). Certain medicines, such as lithium. Low potassium levels. High calcium levels. Gestational DI is rare and is caused by the antidiuretic hormone that has stopped working properly. What are the signs or symptoms? Symptoms of this condition include: Excessive urination. This means urinating more than 10 cups (2.4 L) during a period of 24 hours. Excessive thirst. Too much nighttime urination (nocturia). Nausea. Diarrhea. How is this diagnosed? This condition may be diagnosed based on: Your medical history. A physical exam. Blood tests. Urine tests. A water deprivation test. During this test, you will stop drinking fluids for a period of time and your blood and urine will be checked regularly. An MRI. How is this treated? Once your specific type of  diabetes insipidus is diagnosed, treatment may include one or more of the following: Increasing or limiting your fluid intake. Taking medicines that contain artificial (synthetic) versions of the antidiuretic hormone. Stopping certain medicines that you take. Correcting the balance of minerals (electrolytes) in your body. Changing your diet. You may be put on a low-protein and low-sodium diet. You may need to visit your health care provider regularly to make sure your condition is being treated properly. You may also need to work with providers who specialize in: Kidney problems (nephrologist). Hormone disorders (endocrinologist). Follow these instructions at home:  Eating and drinking Follow instructions from your health care provider about how much fluid and water to drink. You may be directed to drink more fluids and water, or to limit how much fluid and water you drink. Follow instructions from your health care provider about eating or drinking restrictions. General instructions Take over-the-counter and prescription medicines only as told by your health care provider. If directed, monitor your risk of dehydration in extreme heat. Carry a medical alert card or wear medical alert jewelry. Keep all follow-up visits as told by your health care provider. This is important. You may need to visit your health care provider regularly to make sure your condition is being treated properly. Contact a health care provider if: You continue to have symptoms after treatment. Get help right away if: You have extreme thirst. You have symptoms of severe dehydration, such as rapid heart rate, muscle cramps, or confusion. Summary Diabetes insipidus (DI) is a rare condition that causes the body to produce more urine than normal, which leads to thirst and dehydration. Follow instructions  from your health care provider about eating or drinking restrictions. Treatment may include increasing or limiting your  fluid intake and correcting the balance of minerals (electrolytes) in your body. Get help right away if you have symptoms of severe dehydration, such as rapid heart rate, muscle cramps, or confusion. This information is not intended to replace advice given to you by your health care provider. Make sure you discuss any questions you have with your health care provider. Document Revised: 01/06/2020 Document Reviewed: 01/06/2020 Elsevier Patient Education  [place].

## 2022-07-17 NOTE — Progress Notes (Signed)
Acute Office Visit  Subjective:     Patient ID: [name], female    DOB: 01-23-1951, 71 y.o.   MRN: [id number]  Chief Complaint  Patient presents with   Dizziness    1 day    Leg Swelling    Started one day ago    HPI  Dizziness  She reports new onset dizziness. She describes it as feeling light headed, occurs constantly, and typically lasts few minutes.  It typically occurs when she is walking. It is usually relieved by standing still. She has not started new medications around the time the dizziness started.   Associated symptoms: No hearing loss No tinnitus  No chest discomfort No heart palpitations  No heart racing No numbness or tingling of extremities  No nausea No vomiting  No speech difficulty No visual changes    Wt Readings from Last 3 Encounters:  07/17/22 160 lb (72.6 kg)  05/09/22 159 lb (72.1 kg)  02/07/22 159 lb 12.8 oz (72.5 kg)    BP Readings from Last 3 Encounters:  07/17/22 (!) 157/78  05/09/22 138/76  02/07/22 135/83      Lab Results  Component Value Date   WBC 7.4 08/06/2020   HGB 13.2 08/06/2020   HCT 41.6 08/06/2020   MCV 94.5 08/06/2020   PLT 196 08/06/2020   Lab Results  Component Value Date   NA 141 06/05/2021   K 4.3 06/05/2021   CO2 25 06/05/2021   BUN 13 06/05/2021   CREATININE 0.76 06/05/2021   CALCIUM 9.5 06/05/2021   GLUCOSE 136 (H) 06/05/2021     Edema: Patient complains of edema. The location of the edema is feet left.  The edema has been mild. More like trace.  Onset of symptoms was a few days ago, gradually improving since that time. The edema is present  all day . The patient states never.  The swelling has been aggravated by nothing, relieved by nothing, and been associated with nothing. Cardiac risk factors include advanced age (older than 42 for men, 62 for women) and hypertension.   Patient is a 71 year old female who presents to clinic with elevated blood pressure.  Patient's blood pressure is not well  controlled.  Patient reports that she is taking only 1 tablet of her Bystolic instead of 2 Monday.  Review of Systems  Constitutional: Negative.   HENT: Negative.    Eyes: Negative.   Respiratory: Negative.    Cardiovascular:  Positive for leg swelling.  Genitourinary: Negative.   Skin: Negative.  Negative for itching and rash.  All other systems reviewed and are negative.       Objective:    BP (!) 157/78   Pulse 67   Temp 98.6 F (37 C)   Ht '5\' 4"'$  (1.626 m)   Wt 160 lb (72.6 kg)   SpO2 95%   BMI 27.46 kg/m  BP Readings from Last 3 Encounters:  07/17/22 (!) 157/78  05/09/22 138/76  02/07/22 135/83   Wt Readings from Last 3 Encounters:  07/17/22 160 lb (72.6 kg)  05/09/22 159 lb (72.1 kg)  02/07/22 159 lb 12.8 oz (72.5 kg)      Physical Exam Vitals reviewed.  Constitutional:      Appearance: Normal appearance.  HENT:     Head: Normocephalic.     Right Ear: External ear normal.     Left Ear: External ear normal.     Nose: Nose normal.     Mouth/Throat:  Mouth: Mucous membranes are moist.  Eyes:     Conjunctiva/sclera: Conjunctivae normal.  Cardiovascular:     Rate and Rhythm: Normal rate and regular rhythm.     Pulses: Normal pulses.     Heart sounds: Normal heart sounds.     Comments: Trace edema on left ankle /feet Pulmonary:     Effort: Pulmonary effort is normal.     Breath sounds: Normal breath sounds.  Abdominal:     General: Bowel sounds are normal.  Skin:    General: Skin is warm.     Findings: No erythema or rash.  Neurological:     Mental Status: She is alert and oriented to person, place, and time.  Psychiatric:        Behavior: Behavior normal.     Results for orders placed or performed in visit on 07/17/22  Glucose Hemocue Waived  Result Value Ref Range   Glu Hemocue Waived 246 (H) 70 - 99 mg/dL        Assessment & Plan:  Patient is in clinic today with concerns of elevated blood pressure, bilateral mild edema with left  leg greater than right.  With dizziness.  On assessment patient discontinued her metformin due to side effects of pruritus and vaginal discharge, patient also started taking Bystolic twice a day and took it only 1 time a day.  I spent time providing education to patient to please inform provider before discontinuing medication, I also provided patient with education that it is important for blood sugar and blood pressure should be maintained to avoid future stroke.  Patient has had stroke in the past and is a little concerned/worried.    Completed glucose finger stick completed Blood sugar 247, I advised patient to continue  taking metformin and if she had a vaginal discharge we can check for yeast and treat. Patient also is slightly dehydrated as she drinks 16 oz water in 24 hours, I encouraged her to drink water and keep a blood pressure log and return if symptoms are not better  Patient verbalize understanding, education printed and given to patient.    Problem List Items Addressed This Visit   None Visit Diagnoses     Controlled type 2 diabetes mellitus with other specified complication, without long-term current use of insulin ([place])    -  Primary   Relevant Orders   Glucose Hemocue Waived (Completed)   Edema, unspecified type       Dizzy       Elevated blood pressure reading           No orders of the defined types were placed in this encounter.   Return if symptoms worsen or fail to improve.  Ivy Lynn, NP

## 2022-07-19 ENCOUNTER — Observation Stay (HOSPITAL_COMMUNITY)
Admission: EM | Admit: 2022-07-19 | Discharge: 2022-07-21 | Disposition: A | Discharge status: Home Health | Payer: PPO | Attending: Internal Medicine | Admitting: Internal Medicine

## 2022-07-19 ENCOUNTER — Emergency Department (HOSPITAL_COMMUNITY): Payer: PPO

## 2022-07-19 ENCOUNTER — Other Ambulatory Visit: Payer: Self-pay

## 2022-07-19 ENCOUNTER — Encounter (HOSPITAL_COMMUNITY): Payer: Self-pay | Admitting: Emergency Medicine

## 2022-07-19 DIAGNOSIS — I672 Cerebral atherosclerosis: Secondary | ICD-10-CM | POA: Diagnosis not present

## 2022-07-19 DIAGNOSIS — Z7984 Long term (current) use of oral hypoglycemic drugs: Secondary | ICD-10-CM | POA: Diagnosis not present

## 2022-07-19 DIAGNOSIS — I10 Essential (primary) hypertension: Secondary | ICD-10-CM | POA: Insufficient documentation

## 2022-07-19 DIAGNOSIS — R202 Paresthesia of skin: Secondary | ICD-10-CM | POA: Diagnosis present

## 2022-07-19 DIAGNOSIS — R739 Hyperglycemia, unspecified: Secondary | ICD-10-CM | POA: Diagnosis not present

## 2022-07-19 DIAGNOSIS — Z7982 Long term (current) use of aspirin: Secondary | ICD-10-CM | POA: Insufficient documentation

## 2022-07-19 DIAGNOSIS — Z8542 Personal history of malignant neoplasm of other parts of uterus: Secondary | ICD-10-CM | POA: Insufficient documentation

## 2022-07-19 DIAGNOSIS — I639 Cerebral infarction, unspecified: Secondary | ICD-10-CM | POA: Diagnosis not present

## 2022-07-19 DIAGNOSIS — E1169 Type 2 diabetes mellitus with other specified complication: Secondary | ICD-10-CM | POA: Diagnosis not present

## 2022-07-19 DIAGNOSIS — I6381 Other cerebral infarction due to occlusion or stenosis of small artery: Secondary | ICD-10-CM | POA: Diagnosis not present

## 2022-07-19 DIAGNOSIS — E785 Hyperlipidemia, unspecified: Secondary | ICD-10-CM | POA: Diagnosis not present

## 2022-07-19 DIAGNOSIS — E119 Type 2 diabetes mellitus without complications: Secondary | ICD-10-CM

## 2022-07-19 DIAGNOSIS — E1165 Type 2 diabetes mellitus with hyperglycemia: Secondary | ICD-10-CM | POA: Insufficient documentation

## 2022-07-19 DIAGNOSIS — Z79899 Other long term (current) drug therapy: Secondary | ICD-10-CM | POA: Diagnosis not present

## 2022-07-19 DIAGNOSIS — Z950 Presence of cardiac pacemaker: Secondary | ICD-10-CM | POA: Diagnosis not present

## 2022-07-19 DIAGNOSIS — E1159 Type 2 diabetes mellitus with other circulatory complications: Secondary | ICD-10-CM | POA: Diagnosis present

## 2022-07-19 DIAGNOSIS — E041 Nontoxic single thyroid nodule: Secondary | ICD-10-CM | POA: Diagnosis not present

## 2022-07-19 DIAGNOSIS — R600 Localized edema: Secondary | ICD-10-CM | POA: Insufficient documentation

## 2022-07-19 DIAGNOSIS — R41841 Cognitive communication deficit: Secondary | ICD-10-CM | POA: Diagnosis not present

## 2022-07-19 DIAGNOSIS — G238 Other specified degenerative diseases of basal ganglia: Secondary | ICD-10-CM | POA: Diagnosis not present

## 2022-07-19 DIAGNOSIS — R410 Disorientation, unspecified: Secondary | ICD-10-CM | POA: Diagnosis not present

## 2022-07-19 DIAGNOSIS — R519 Headache, unspecified: Secondary | ICD-10-CM | POA: Diagnosis present

## 2022-07-19 DIAGNOSIS — I152 Hypertension secondary to endocrine disorders: Secondary | ICD-10-CM | POA: Diagnosis present

## 2022-07-19 LAB — CBC WITH DIFFERENTIAL/PLATELET
Abs Immature Granulocytes: 0.04 10*3/uL (ref 0.00–0.07)
Basophils Absolute: 0 10*3/uL (ref 0.0–0.1)
Basophils Relative: 0 %
Eosinophils Absolute: 0 10*3/uL (ref 0.0–0.5)
Eosinophils Relative: 0 %
HCT: 40.7 % (ref 36.0–46.0)
Hemoglobin: 13.7 g/dL (ref 12.0–15.0)
Immature Granulocytes: 0 %
Lymphocytes Relative: 17 %
Lymphs Abs: 2 10*3/uL (ref 0.7–4.0)
MCH: 31 pg (ref 26.0–34.0)
MCHC: 33.7 g/dL (ref 30.0–36.0)
MCV: 92.1 fL (ref 80.0–100.0)
Monocytes Absolute: 0.7 10*3/uL (ref 0.1–1.0)
Monocytes Relative: 6 %
Neutro Abs: 9.4 10*3/uL — ABNORMAL HIGH (ref 1.7–7.7)
Neutrophils Relative %: 77 %
Platelets: 177 10*3/uL (ref 150–400)
RBC: 4.42 MIL/uL (ref 3.87–5.11)
RDW: 12.6 % (ref 11.5–15.5)
WBC: 12.3 10*3/uL — ABNORMAL HIGH (ref 4.0–10.5)
nRBC: 0 % (ref 0.0–0.2)

## 2022-07-19 LAB — URINALYSIS, ROUTINE W REFLEX MICROSCOPIC
Bilirubin Urine: NEGATIVE
Glucose, UA: NEGATIVE mg/dL
Hgb urine dipstick: NEGATIVE
Ketones, ur: NEGATIVE mg/dL
Leukocytes,Ua: NEGATIVE
Nitrite: NEGATIVE
Protein, ur: NEGATIVE mg/dL
Specific Gravity, Urine: 1.005 (ref 1.005–1.030)
pH: 6 (ref 5.0–8.0)

## 2022-07-19 LAB — BASIC METABOLIC PANEL
Anion gap: 11 (ref 5–15)
BUN: 16 mg/dL (ref 8–23)
CO2: 26 mmol/L (ref 22–32)
Calcium: 9.8 mg/dL (ref 8.9–10.3)
Chloride: 99 mmol/L (ref 98–111)
Creatinine, Ser: 0.74 mg/dL (ref 0.44–1.00)
GFR, Estimated: >60 mL/min (ref >60)
Glucose, Bld: 127 mg/dL — ABNORMAL HIGH (ref 70–99)
Potassium: 3.7 mmol/L (ref 3.5–5.1)
Sodium: 136 mmol/L (ref 135–145)

## 2022-07-19 LAB — CBG MONITORING, ED: Glucose-Capillary: 106 mg/dL — ABNORMAL HIGH (ref 70–99)

## 2022-07-19 LAB — PROTIME-INR
INR: 1 (ref 0.8–1.2)
Prothrombin Time: 13 seconds (ref 11.4–15.2)

## 2022-07-19 MED ORDER — METFORMIN HCL 500 MG PO TABS: 500.0000 mg | ORAL_TABLET | Freq: Every day | ORAL | Status: DC

## 2022-07-19 MED ORDER — SERTRALINE HCL 50 MG PO TABS
50.0000 mg | ORAL_TABLET | Freq: Every day | ORAL | Status: DC
  Administered 2022-07-20 – 2022-07-21 (×2): 50 mg via ORAL
  Filled 2022-07-19 (×3): qty 1

## 2022-07-19 MED ORDER — NEBIVOLOL HCL 5 MG PO TABS
5.0000 mg | ORAL_TABLET | Freq: Once | ORAL | Status: AC
  Administered 2022-07-19: 5 mg via ORAL
  Filled 2022-07-19: qty 1

## 2022-07-19 NOTE — ED Provider Triage Note (Signed)
Emergency Medicine Provider Triage Evaluation Note  [name] , a 71 y.o. female  was evaluated in triage.  Pt complains of headache, high blood pressure and high blood sugar.  Patient reports she has been having intermittent headaches for about a month.  On Monday she saw her PCP and they adjusted her blood pressure and diabetes medications.  Told her that her A1c was high.  She reports despite increasing her metformin she is continued to have elevated blood sugars in the 200s.  She also reports that she has had some numbness in her left foot since Monday.  She also reports intermittent confusion recently.  Review of Systems  Positive: Headache, confusion, numbness Negative: Chest pain, shortness of breath, fever, chills, abdominal pain  Physical Exam  BP (!) 159/86   Pulse 75   Temp 98.5 F (36.9 C) (Oral)   Resp 15   SpO2 99%  Gen:   Awake, no distress   Resp:  Normal effort  MSK:   Moves extremities without difficulty  Other:  No focal neurologic deficits  Medical Decision Making  Medically screening exam initiated at 4:19 PM.  Appropriate orders placed.  [name] was informed that the remainder of the evaluation will be completed by another provider, this initial triage assessment does not replace that evaluation, and the importance of remaining in the ED until their evaluation is complete.     Jacqlyn Larsen, [place] 07/19/22 [phone]

## 2022-07-19 NOTE — ED Triage Notes (Addendum)
Patient reports headache that starts this AM, BP, and blood sugars were also elevated. Pt saw PCP on Monday and a1c was elevated and patient instructed if she did not feel better to be evaluated at the ED. Patient also reports numbness in her foot. Patient reports falling and hitting her head yesterday.  Denies chest pain, abdominal pain.

## 2022-07-19 NOTE — ED Provider Notes (Signed)
[facility] EMERGENCY DEPARTMENT Provider Note   CSN: [id number] Arrival date & time: 07/19/22  1538     History  Chief Complaint  Patient presents with   Headache    [name] is a 72 y.o. female.  The history is provided by the patient and a significant other.   Patient with history of diabetes and hypertension presents with multiple complaints.  Patient has acute swelling she reports for least 3 days she has had generalized headache, elevated blood pressure and elevated blood glucose.  She also reports at times her left foot feels weak.  She reports she has had some difficulty walking and even fell.  No traumatic injuries reported. No fevers or vomiting.  No chest pain.  No new vision changes.  No slurred speech.  She has had some confusion and her companion reports that she is slower to respond    Home Medications Prior to Admission medications   Medication Sig Start Date End Date Taking? Authorizing Provider  aspirin EC 81 MG EC tablet Take 1 tablet (81 mg total) by mouth daily. 02/16/19   Regalado, Belkys A, MD  Blood Glucose Monitoring Suppl (ONE TOUCH ULTRA 2) w/Device KIT 1 Device by Does not apply route daily. 09/17/17   Eustaquio Maize, MD  BYSTOLIC 5 MG tablet TAKE (1) TABLET TWICE A DAY. 03/23/22   Evans Lance, MD  calcium citrate-vitamin D (CITRACAL+D) 315-200 MG-UNIT per tablet Take 1 tablet by mouth daily.    [provider]  clobetasol ointment (TEMOVATE) 0.96 % Apply 1 application topically at bedtime for 42 days, THEN 1 application 2 (two) times a week. 02/07/22 03/21/23  Janora Norlander, DO  diclofenac Sodium (VOLTAREN) 1 % GEL Apply 4 g topically 4 (four) times daily. 08/04/21   Ronnie Doss M, DO  glucose blood test strip Use to check BG up to qd 09/02/17   Eustaquio Maize, MD  icosapent Ethyl (VASCEPA) 1 g capsule Take 2 capsules (2 g total) by mouth 2 (two) times daily. 02/06/22   Janora Norlander, DO  metFORMIN  (GLUCOPHAGE) 500 MG tablet Take 1 tablet (500 mg total) by mouth daily with supper. 11/06/21   Ronnie Doss M, DO  ONETOUCH DELICA LANCETS FINE MISC Use to check BG up to qd 09/02/17   Eustaquio Maize, MD  pantoprazole (PROTONIX) 40 MG tablet Take 1 tablet (40 mg total) by mouth daily. 08/04/21   Janora Norlander, DO  sertraline (ZOLOFT) 50 MG tablet Take 1 tablet (50 mg total) by mouth daily. 08/04/21   Janora Norlander, DO      Allergies    Glipizide and Pravachol [pravastatin sodium]    Review of Systems   Review of Systems  Constitutional:  Negative for fever.  Eyes:  Negative for visual disturbance.  Cardiovascular:  Negative for chest pain.  Neurological:  Positive for headaches.    Physical Exam Updated Vital Signs BP (!) 168/80   Pulse 76   Temp 98.2 F (36.8 C) (Oral)   Resp 18   SpO2 98%  Physical Exam CONSTITUTIONAL: Well developed/well nourished HEAD: Normocephalic/atraumatic EYES: EOMI/PERRL, no nystagmus,no ptosis ENMT: Mucous membranes moist NECK: supple no meningeal signs CV: S1/S2 noted, no murmurs/rubs/gallops noted LUNGS: Lungs are clear to auscultation bilaterally, no apparent distress ABDOMEN: soft, nontender, no rebound or guarding GU:no cva tenderness NEURO:Awake/alert, face symmetric, no arm or leg drift is noted Equal 5/5 strength with shoulder abduction, elbow flex/extension, wrist flex/extension in  upper extremities and equal hand grips bilaterally Equal 5/5 strength with hip flexion,knee flex/extension, foot dorsi/plantar flexion Cranial nerves 3/4/5/6/06/24/09/11/12 tested and intact Sensation to light touch intact in all extremities EXTREMITIES: pulses normal, full ROM SKIN: warm, color normal PSYCH: no abnormalities of mood noted  ED Results / Procedures / Treatments   Labs (all labs ordered are listed, but only abnormal results are displayed) Labs Reviewed  CBC WITH DIFFERENTIAL/PLATELET - Abnormal; Notable for the following  components:      Result Value   WBC 12.3 (*)    Neutro Abs 9.4 (*)    All other components within normal limits  BASIC METABOLIC PANEL - Abnormal; Notable for the following components:   Glucose, Bld 127 (*)    All other components within normal limits  URINALYSIS, ROUTINE W REFLEX MICROSCOPIC - Abnormal; Notable for the following components:   Color, Urine STRAW (*)    All other components within normal limits  CBG MONITORING, ED - Abnormal; Notable for the following components:   Glucose-Capillary 106 (*)    All other components within normal limits  PROTIME-INR    EKG EKG Interpretation  Date/Time:  Friday July 20 2022 00:19:03 EDT Ventricular Rate:  69 PR Interval:  172 QRS Duration: 93 QT Interval:  415 QTC Calculation: 445 R Axis:   -24 Text Interpretation: Sinus rhythm Left atrial enlargement Borderline left axis deviation Consider anterior infarct Confirmed by Ripley Fraise [id number]) on 07/20/2022 12:42:08 AM  Radiology CT Head Wo Contrast  Result Date: 07/19/2022 CLINICAL DATA:  Headache, new or worsening. High blood pressure and high blood sugar. Intermittent headaches for a month. EXAM: CT HEAD WITHOUT CONTRAST TECHNIQUE: Contiguous axial images were obtained from the base of the skull through the vertex without intravenous contrast. RADIATION DOSE REDUCTION: This exam was performed according to the departmental dose-optimization program which includes automated exposure control, adjustment of the mA and/or kV according to patient size and/or use of iterative reconstruction technique. COMPARISON:  08/06/2020 FINDINGS: Brain: Focal areas of encephalomalacia in the left occipital region consistent with old infarct and unchanged since prior study. New areas of encephalomalacia demonstrated in the left thalamus, left caudate lobe, right cerebellum, and right centrum semiovale have developed since previous study. Appearance is likely to represent old lacunar infarcts. Acute  lacunar changes are not excluded. Basal ganglia calcifications. No mass-effect or midline shift. No abnormal extra-axial fluid collections. Gray-white matter junctions are distinct. Basal cisterns are not effaced. No acute intracranial hemorrhage. Vascular: Intracranial arterial calcifications. Skull: Calvarium appears intact. Sinuses/Orbits: Paranasal sinuses and mastoid air cells are clear. Other: None. IMPRESSION: 1. 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 2. New foci of encephalomalacia consistent with lacunar infarcts demonstrated in the left thalamus, left caudate lobe, right cerebellum, and right symptoms in the ovale. 3. No acute intracranial hemorrhage or mass effect. Electronically Signed   By: Lucienne Capers M.D.   On: 07/19/2022 17:10   DG Chest 2 View  Result Date: 07/19/2022 CLINICAL DATA:  Confusion EXAM: CHEST - 2 VIEW COMPARISON:  11/06/2007 FINDINGS: LEFT-sided pacemaker overlies normal cardiac silhouette. No effusion, infiltrate or pneumothorax. Degenerative osteophytosis of the spine. IMPRESSION: No acute cardiopulmonary process. Electronically Signed   By: Suzy Bouchard M.D.   On: 07/19/2022 16:58    Procedures Procedures    Medications Ordered in ED Medications  metFORMIN (GLUCOPHAGE) tablet 500 mg (has no administration in time range)  sertraline (ZOLOFT) tablet 50 mg (has no administration in time range)  nebivolol (BYSTOLIC) tablet 5 mg (5 mg Oral Given 07/19/22 2358)    ED Course/ Medical Decision Making/ A&P Clinical Course as of 07/20/22 0045  Thu Jul 19, 2022  2304 Glucose(!): 127 Hyperglycemia [DW]  2304 WBC(!): 12.3 Leukocytosis [DW]  Fri Jul 20, 2022  0044 Discussed with neurology who will see the patient [DW]  0044 Discussed with Dr. Hal Hope for admission [DW]  [phone] Patient resting comfortably no acute distress, will need blood pressure management as inpatient [DW]    Clinical Course User Index [DW] Ripley Fraise,  MD                           Medical Decision Making Amount and/or Complexity of Data Reviewed Labs: ordered. Decision-making details documented in ED Course.  Risk Prescription drug management. Decision regarding hospitalization.   This patient presents to the ED for concern of headache and elevated blood pressure and left foot weakness, this involves an extensive number of treatment options, and is a complaint that carries with it a high risk of complications and morbidity.  The differential diagnosis includes but is not limited to CVA, hypertensive emergency, intracranial hemorrhage, lumbosacral radiculopathy  Comorbidities that complicate the patient evaluation: Patient's presentation is complicated by their history of HTN and diabetes   Additional history obtained: Additional history obtained from significant other Records reviewed Primary Care Documents Previous admission records reviewed, previous history of occipital stroke  Lab Tests: I Ordered, and personally interpreted labs.  The pertinent results include: Hyperglycemia, leukocytosis  Imaging Studies ordered: I ordered imaging studies including CT scan head   I independently visualized and interpreted imaging which showed an infarct I agree with the radiologist interpretation  Cardiac Monitoring: The patient was maintained on a cardiac monitor.  I personally viewed and interpreted the cardiac monitor which showed an underlying rhythm of:  sinus rhythm  Critical Interventions:  Admission and neurology consultation  Consultations Obtained: I requested consultation with the admitting physician [facility] hospitalist and radiologist neurology , and discussed  findings as well as pertinent plan - they recommend: Admission  Reevaluation: After the interventions noted above, I reevaluated the patient and found that they have :stayed the same  Complexity of problems addressed: Patient's presentation is most consistent with   acute presentation with potential threat to life or bodily function  Disposition: After consideration of the diagnostic results and the patient's response to treatment,  I feel that the patent would benefit from admission   .           Final Clinical Impression(s) / ED Diagnoses Final diagnoses:  Cerebrovascular accident (CVA), unspecified mechanism ([place])    Rx / [place] Orders ED Discharge Orders     None         Ripley Fraise, MD 07/20/22 0045

## 2022-07-20 ENCOUNTER — Observation Stay (HOSPITAL_BASED_OUTPATIENT_CLINIC_OR_DEPARTMENT_OTHER): Payer: PPO

## 2022-07-20 ENCOUNTER — Observation Stay (HOSPITAL_COMMUNITY): Payer: PPO

## 2022-07-20 ENCOUNTER — Other Ambulatory Visit: Payer: Self-pay

## 2022-07-20 ENCOUNTER — Encounter (HOSPITAL_COMMUNITY): Payer: Self-pay | Admitting: Internal Medicine

## 2022-07-20 DIAGNOSIS — R202 Paresthesia of skin: Secondary | ICD-10-CM

## 2022-07-20 DIAGNOSIS — E041 Nontoxic single thyroid nodule: Secondary | ICD-10-CM | POA: Diagnosis not present

## 2022-07-20 DIAGNOSIS — I6389 Other cerebral infarction: Secondary | ICD-10-CM

## 2022-07-20 DIAGNOSIS — I639 Cerebral infarction, unspecified: Secondary | ICD-10-CM

## 2022-07-20 DIAGNOSIS — I672 Cerebral atherosclerosis: Secondary | ICD-10-CM | POA: Diagnosis not present

## 2022-07-20 DIAGNOSIS — R609 Edema, unspecified: Secondary | ICD-10-CM

## 2022-07-20 DIAGNOSIS — I6523 Occlusion and stenosis of bilateral carotid arteries: Secondary | ICD-10-CM | POA: Diagnosis not present

## 2022-07-20 DIAGNOSIS — Z8673 Personal history of transient ischemic attack (TIA), and cerebral infarction without residual deficits: Secondary | ICD-10-CM | POA: Diagnosis not present

## 2022-07-20 HISTORY — DX: Cerebral infarction, unspecified: I63.9

## 2022-07-20 HISTORY — DX: Paresthesia of skin: R20.2

## 2022-07-20 LAB — CBG MONITORING, ED
Glucose-Capillary: 132 mg/dL — ABNORMAL HIGH (ref 70–99)
Glucose-Capillary: 171 mg/dL — ABNORMAL HIGH (ref 70–99)

## 2022-07-20 LAB — COMPREHENSIVE METABOLIC PANEL
ALT: 13 U/L (ref 0–44)
AST: 16 U/L (ref 15–41)
Albumin: 3.5 g/dL (ref 3.5–5.0)
Alkaline Phosphatase: 67 U/L (ref 38–126)
Anion gap: 10 (ref 5–15)
BUN: 14 mg/dL (ref 8–23)
CO2: 24 mmol/L (ref 22–32)
Calcium: 9.2 mg/dL (ref 8.9–10.3)
Chloride: 102 mmol/L (ref 98–111)
Creatinine, Ser: 0.74 mg/dL (ref 0.44–1.00)
GFR, Estimated: >60 mL/min (ref >60)
Glucose, Bld: 147 mg/dL — ABNORMAL HIGH (ref 70–99)
Potassium: 4 mmol/L (ref 3.5–5.1)
Sodium: 136 mmol/L (ref 135–145)
Total Bilirubin: 0.9 mg/dL (ref 0.3–1.2)
Total Protein: 6.4 g/dL — ABNORMAL LOW (ref 6.5–8.1)

## 2022-07-20 LAB — CBC
HCT: 39.7 % (ref 36.0–46.0)
Hemoglobin: 13.4 g/dL (ref 12.0–15.0)
MCH: 30.8 pg (ref 26.0–34.0)
MCHC: 33.8 g/dL (ref 30.0–36.0)
MCV: 91.3 fL (ref 80.0–100.0)
Platelets: 178 10*3/uL (ref 150–400)
RBC: 4.35 MIL/uL (ref 3.87–5.11)
RDW: 12.7 % (ref 11.5–15.5)
WBC: 10.7 10*3/uL — ABNORMAL HIGH (ref 4.0–10.5)
nRBC: 0 % (ref 0.0–0.2)

## 2022-07-20 LAB — LIPID PANEL
Cholesterol: 223 mg/dL — ABNORMAL HIGH (ref 0–200)
HDL: 49 mg/dL (ref >40)
LDL Cholesterol: 147 mg/dL — ABNORMAL HIGH (ref 0–99)
Total CHOL/HDL Ratio: 4.6 RATIO
Triglycerides: 134 mg/dL (ref <150)
VLDL: 27 mg/dL (ref 0–40)

## 2022-07-20 LAB — ECHOCARDIOGRAM COMPLETE
Area-P 1/2: 2.36 cm2
S' Lateral: 2.4 cm

## 2022-07-20 LAB — HEMOGLOBIN A1C
Hgb A1c MFr Bld: 7 % — ABNORMAL HIGH (ref 4.8–5.6)
Mean Plasma Glucose: 154.2 mg/dL

## 2022-07-20 LAB — HIV ANTIBODY (ROUTINE TESTING W REFLEX): HIV Screen 4th Generation wRfx: NONREACTIVE

## 2022-07-20 MED ORDER — SODIUM CHLORIDE 0.9 % IV SOLN: INTRAVENOUS | Status: AC

## 2022-07-20 MED ORDER — STROKE: EARLY STAGES OF RECOVERY BOOK
Freq: Once | Status: AC
  Filled 2022-07-20 (×2): qty 1

## 2022-07-20 MED ORDER — ACETAMINOPHEN 160 MG/5ML PO SOLN: 650.0000 mg | ORAL | Status: DC | PRN

## 2022-07-20 MED ORDER — HYDRALAZINE HCL 20 MG/ML IJ SOLN
10.0000 mg | INTRAMUSCULAR | Status: DC | PRN
Start: 2022-07-20 — End: 2022-07-20

## 2022-07-20 MED ORDER — MUSCLE RUB 10-15 % EX CREA
TOPICAL_CREAM | CUTANEOUS | Status: DC | PRN
  Filled 2022-07-20: qty 85

## 2022-07-20 MED ORDER — CLOPIDOGREL BISULFATE 75 MG PO TABS
75.0000 mg | ORAL_TABLET | Freq: Every day | ORAL | Status: DC
  Administered 2022-07-20 – 2022-07-21 (×2): 75 mg via ORAL
  Filled 2022-07-20 (×2): qty 1

## 2022-07-20 MED ORDER — ASPIRIN 81 MG PO CHEW
81.0000 mg | CHEWABLE_TABLET | Freq: Every day | ORAL | Status: DC
  Administered 2022-07-20 – 2022-07-21 (×2): 81 mg via ORAL
  Filled 2022-07-20 (×2): qty 1

## 2022-07-20 MED ORDER — ASPIRIN 300 MG RE SUPP: 300.0000 mg | Freq: Every day | RECTAL | Status: DC

## 2022-07-20 MED ORDER — ROSUVASTATIN CALCIUM 20 MG PO TABS
20.0000 mg | ORAL_TABLET | Freq: Every day | ORAL | Status: DC
  Administered 2022-07-20 – 2022-07-21 (×2): 20 mg via ORAL
  Filled 2022-07-20 (×2): qty 1

## 2022-07-20 MED ORDER — ENOXAPARIN SODIUM 40 MG/0.4ML IJ SOSY
40.0000 mg | PREFILLED_SYRINGE | INTRAMUSCULAR | Status: DC
  Administered 2022-07-20 – 2022-07-21 (×2): 40 mg via SUBCUTANEOUS
  Filled 2022-07-20 (×2): qty 0.4

## 2022-07-20 MED ORDER — INSULIN ASPART 100 UNIT/ML IJ SOLN
0.0000 [IU] | Freq: Three times a day (TID) | INTRAMUSCULAR | Status: DC
  Administered 2022-07-20 (×2): 1 [IU] via SUBCUTANEOUS
  Administered 2022-07-21: 2 [IU] via SUBCUTANEOUS
  Administered 2022-07-21: 3 [IU] via SUBCUTANEOUS

## 2022-07-20 MED ORDER — HYDRALAZINE HCL 20 MG/ML IJ SOLN: 5.0000 mg | INTRAMUSCULAR | Status: DC | PRN

## 2022-07-20 MED ORDER — ACETAMINOPHEN 650 MG RE SUPP: 650.0000 mg | RECTAL | Status: DC | PRN

## 2022-07-20 MED ORDER — IOHEXOL 350 MG/ML SOLN
100.0000 mL | Freq: Once | INTRAVENOUS | Status: AC | PRN
  Administered 2022-07-20: 75 mL via INTRAVENOUS

## 2022-07-20 MED ORDER — ACETAMINOPHEN 325 MG PO TABS: 650.0000 mg | ORAL_TABLET | ORAL | Status: DC | PRN

## 2022-07-20 MED ORDER — PANTOPRAZOLE SODIUM 40 MG PO TBEC
40.0000 mg | DELAYED_RELEASE_TABLET | Freq: Every day | ORAL | Status: DC
  Administered 2022-07-20 – 2022-07-21 (×2): 40 mg via ORAL
  Filled 2022-07-20 (×2): qty 1

## 2022-07-20 NOTE — ED Notes (Signed)
Pt back from CT

## 2022-07-20 NOTE — ED Notes (Signed)
RN attempted to call floor to give report to CN. X2

## 2022-07-20 NOTE — Consult Note (Signed)
NEUROLOGY CONSULTATION NOTE   Date of service: July 20, 2022 Patient Name: [name] MRN:  [id number] DOB:  10/26/1951 Reason for consult: "strokes on [facility]" Requesting Provider: Rise Patience, MD _ _ _   _ __   _ __ _ _  __ __   _ __   __ _  History of Present Illness  [name] is a 71 y.o. female with PMH significant for DM2, GERD, HLD, HTN, who presents with elevated blood pressure.  She reports that she checked her blood pressure at home and was elevated to 170s and she got really concerned and came to the ED.  On further questioning reports that on Monday, she and her husband went to a funeral and she noted that her left foot was weak, felt heavy and was giving her a lot of trouble.  She thought that this was initially because of wearing a shoe that was too big for foot.  She reports the next day, she felt that her left leg was swollen.  She saw her primary care doctor the same day.  She endorses a history of diabetes, hypertension and hyperlipidemia.  She reports that her diabetes is not well controlled, neither is her hypertension or high cholesterol.  She does not smoke, does not drink alcohol, does not use any recreational substances.  She does endorses family history of stroke in her mother.  LKW: 07/16/2022.  An unknown time. mRS: 1 tNKASE: Not offered as patient is outside the window. Thrombectomy: Not offered as patient is outside the window. NIHSS components Score: Comment  1a Level of Conscious 0'[x]'$  1'[]'$  2'[]'$  3'[]'$      1b LOC Questions 0'[x]'$  1'[]'$  2'[]'$       1c LOC Commands 0'[x]'$  1'[]'$  2'[]'$       2 Best Gaze 0'[x]'$  1'[]'$  2'[]'$       3 Visual 0'[]'$  1'[x]'$  2'[]'$  3'[]'$    Inconsistent responses in right hemivisual field.  4 Facial Palsy 0'[x]'$  1'[]'$  2'[]'$  3'[]'$      5a Motor Arm - left 0'[x]'$  1'[]'$  2'[]'$  3'[]'$  4'[]'$  UN'[]'$    5b Motor Arm - Right 0'[x]'$  1'[]'$  2'[]'$  3'[]'$  4'[]'$  UN'[]'$    6a Motor Leg - Left 0'[]'$  1'[x]'$  2'[]'$  3'[]'$  4'[]'$  UN'[]'$    6b Motor Leg - Right 0'[x]'$  1'[]'$  2'[]'$  3'[]'$  4'[]'$  UN'[]'$    7 Limb Ataxia 0'[]'$  1'[x]'$  2'[]'$  3'[]'$  UN'[]'$     8  Sensory 0'[x]'$  1'[]'$  2'[]'$  UN'[]'$      9 Best Language 0'[x]'$  1'[]'$  2'[]'$  3'[]'$      10 Dysarthria 0'[x]'$  1'[]'$  2'[]'$  UN'[]'$      11 Extinct. and Inattention 0'[x]'$  1'[]'$  2'[]'$       TOTAL: 3        ROS   Constitutional Denies weight loss, fever and chills.   HEENT Denies changes in vision and hearing.   Respiratory Denies SOB and cough.   CV Denies palpitations and CP   GI Denies abdominal pain, nausea, vomiting and diarrhea.   GU Denies dysuria and urinary frequency.   MSK Denies myalgia and joint pain.   Skin Denies rash and pruritus.   Neurological Denies headache and syncope.   Psychiatric Denies recent changes in mood. Denies anxiety and depression.    Past History   Past Medical History:  Diagnosis Date   Anxiety    Arthritis    of the neck   Colon polyps    Diabetes mellitus without complication ([place])    Esophageal stricture    Essential hypertension 01/05/2020   GERD (gastroesophageal reflux  disease)    Hiatal Hernia   History of uterine cancer    Hyperlipidemia    Hypertension    Osteoporosis    Pacemaker    Symptomatic bradycardia    Past Surgical History:  Procedure Laterality Date   APPENDECTOMY     CHOLECYSTECTOMY     LAPAROSCOPIC TOTAL HYSTERECTOMY     LYMPH NODE DISSECTION     bilateral pelvic and right-sided periaortic   PACEMAKER GENERATOR CHANGE N/A 05/21/2014   Procedure: PACEMAKER GENERATOR CHANGE;  Surgeon: Evans Lance, MD;  Location: [facility] CATH LAB;  Service: Cardiovascular;  Laterality: N/A;   status post pacemaker     metronic [id number]   TRANSTHORACIC ECHOCARDIOGRAM  08/30/04   TUBAL LIGATION     Family History  Problem Relation Age of Onset   Heart disease Mother    Heart attack Mother    Heart disease Father    Heart attack Father    Cancer Sister        breast   Breast cancer Sister    Alcohol abuse Brother    COPD Sister    Healthy Daughter    Social History   Socioeconomic History   Marital status: Significant Other    Spouse name: Not on file    Number of children: 1   Years of education: GED   Highest education level: GED or equivalent  Occupational History   Occupation: retired    Comment: Engineer, manufacturing systems  Tobacco Use   Smoking status: Never   Smokeless tobacco: Never  Scientific laboratory technician Use: Never used  Substance and Sexual Activity   Alcohol use: No    Comment: may have a social drink rarely   Drug use: No   Sexual activity: Not Currently  Other Topics Concern   Not on file  Social History Narrative   Not on file   Social Determinants of Health   Financial Resource Strain: Low Risk  (11/13/2021)   Overall Financial Resource Strain (CARDIA)    Difficulty of Paying Living Expenses: Not hard at all  Food Insecurity: No Food Insecurity (11/13/2021)   Hunger Vital Sign    Worried About Running Out of Food in the Last Year: Never true    [place] in the Last Year: Never true  Transportation Needs: No Transportation Needs (11/13/2021)   PRAPARE - Hydrologist (Medical): No    Lack of Transportation (Non-Medical): No  Physical Activity: Sufficiently Active (11/13/2021)   Exercise Vital Sign    Days of Exercise per Week: 5 days    Minutes of Exercise per Session: 30 min  Stress: No Stress Concern Present (11/13/2021)   [place]    Feeling of Stress : Only a little  Social Connections: Socially Integrated (11/13/2021)   Social Connection and Isolation Panel [NHANES]    Frequency of Communication with Friends and Family: More than three times a week    Frequency of Social Gatherings with Friends and Family: More than three times a week    Attends Religious Services: 1 to 4 times per year    Active Member of [employer] or Organizations: Yes    Attends Archivist Meetings: 1 to 4 times per year    Marital Status: Living with partner   Allergies  Allergen Reactions   Glipizide Itching   Pravachol [Pravastatin  Sodium] Other (See Comments)    Stomach pain and constipation  Medications  (Not in a hospital admission)    Vitals   Vitals:   07/19/22 2253 07/20/22 0000 07/20/22 0050 07/20/22 0100  BP: (!) 163/88 (!) 168/80  (!) 147/87  Pulse: 70 76  72  Resp: '16 18  14  '$ Temp:   98.2 F (36.8 C)   TempSrc:   Oral   SpO2: 98% 98%  97%     There is no height or weight on file to calculate BMI.  Physical Exam   General: Laying comfortably in bed; in no acute distress.  HENT: Normal oropharynx and mucosa. Normal external appearance of ears and nose.  Neck: Supple, no pain or tenderness  CV: No JVD. No peripheral edema.  Pulmonary: Symmetric Chest rise. Normal respiratory effort.  Abdomen: Soft to touch, non-tender.  Ext: No cyanosis, edema, or deformity  Skin: No rash. Normal palpation of skin.   Musculoskeletal: Normal digits and nails by inspection. No clubbing.   Neurologic Examination  Mental status/Cognition: Alert, oriented to self, place, month and year, good attention.  Speech/language: Fluent, comprehension intact, object naming intact, repetition intact.  Cranial nerves:   CN II Pupils equal and reactive to light, no VF deficits    CN III,IV,VI EOM intact, no gaze preference or deviation, no nystagmus    CN V normal sensation in V1, V2, and V3 segments bilaterally    CN VII no asymmetry, no nasolabial fold flattening    CN VIII normal hearing to speech    CN IX & X normal palatal elevation, no uvular deviation    CN XI 5/5 head turn and 5/5 shoulder shrug bilaterally    CN XII midline tongue protrusion    Motor:  Muscle bulk: normal, tone normal Mvmt Root Nerve  Muscle Right Left Comments  SA C5/6 Ax Deltoid 4+ 5   EF C5/6 Mc Biceps 4+ 5   EE C6/7/8 Rad Triceps 4+ 5   WF C6/7 Med FCR     WE C7/8 PIN ECU     F Ab C8/T1 U ADM/FDI 5 5   HF L1/2/3 Fem Illopsoas 5 4   KE L2/3/4 Fem Quad 5 4   DF L4/5 D Peron Tib Ant 5 3   PF S1/2 Tibial Grc/Sol 5 3    Reflexes:   Right Left Comments  Pectoralis      Biceps (C5/6) 2 2   Brachioradialis (C5/6) 2 2    Triceps (C6/7) 2 2    Patellar (L3/4) 2 2    Achilles (S1)      Hoffman      Plantar     Jaw jerk    Sensation:  Light touch Intact throughout   Pin prick    Temperature    Vibration   Proprioception    Coordination/Complex Motor:  - Finger to Nose intact bilaterally - Heel to shin with ataxia in left lower extremity. - Rapid alternating movement are slowed in right upper extremity - Gait: Deferred for patient safety Labs   CBC:  Recent Labs  Lab 07/19/22 1636  WBC 12.3*  NEUTROABS 9.4*  HGB 13.7  HCT 40.7  MCV 92.1  PLT 242    Basic Metabolic Panel:  Lab Results  Component Value Date   NA 136 07/19/2022   K 3.7 07/19/2022   CO2 26 07/19/2022   GLUCOSE 127 (H) 07/19/2022   BUN 16 07/19/2022   CREATININE 0.74 07/19/2022   CALCIUM 9.8 07/19/2022   GFRNONAA >60 07/19/2022   GFRAA >60 08/06/2020  Lipid Panel:  Lab Results  Component Value Date   LDLCALC 70 05/09/2022   HgbA1c:  Lab Results  Component Value Date   HGBA1C 6.7 (H) 05/09/2022   Urine Drug Screen: No results found for: "LABOPIA", "COCAINSCRNUR", "LABBENZ", "AMPHETMU", "THCU", "LABBARB"  Alcohol Level No results found for: "ETH"  CT Head without contrast(Personally reviewed): 1. 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 2. New foci of encephalomalacia consistent with lacunar infarcts demonstrated in the left thalamus, left caudate lobe, right cerebellum, and right symptoms in the ovale. 3. No acute intracranial hemorrhage or mass effect.  CT angio Head and Neck with contrast: pending  MRI Brain: pending  Impression   [name] is a 71 y.o. female with PMH significant for DM2, GERD, HLD, HTN, who presents with right upper extremity and left lower extremity weakness.  She has poorly controlled diabetes, hypertension, hyperlipidemia.  CT head is concerning for new  lacunar infarct in left thalamus, left caudate, right cerebellum.  Unclear if her pacemaker is MRI compatible. Suspect that the strokes are acute and recommend admission for stroke workup.   Primary Diagnosis:  Other cerebral infarction due to occlusion of stenosis of small artery.  Secondary Diagnosis: Essential (primary) hypertension and Type 2 diabetes mellitus with hyperglycemia   Recommendations   - Frequent Neuro checks per stroke unit protocol - Recommend Vascular imaging with CT angio head and neck - Recommend obtaining TTE - Recommend obtaining Lipid panel with LDL - Please start statin if LDL > 70 - Recommend HbA1c - Antithrombotic -aspirin 81 mg daily along with Plavix 75 mg daily for 21 days, followed by aspirin 81 mg daily alone. - Recommend DVT ppx - SBP goal - permissive hypertension first 24 h < 220/110. Held home meds.  - Recommend Telemetry monitoring for arrythmia - Recommend bedside swallow screen prior to PO intake. - Stroke education booklet - Recommend PT/OT/SLP consult    ______________________________________________________________________   Thank you for the opportunity to take part in the care of this patient. If you have any further questions, please contact the neurology consultation attending.  Signed,  [place] Pager Number [phone] _ _ _   _ __   _ __ _ _  __ __   _ __   __ _

## 2022-07-20 NOTE — Progress Notes (Addendum)
STROKE TEAM PROGRESS NOTE   INTERVAL HISTORY Patient is seen in her room with no family at the bedside.  On Monday, she experienced acute onset weakness and heaviness of her left leg.  She presented to the ED yesterday with elevated blood pressure.  She denied back pain all radicular pain or sciatica.  She did complain of some visual symptoms earlier but today she is denying this when I specifically asked her.  Vital signs are stable.  Neurological exam is unchanged.  CT scan of the head shows no acute abnormality.  CT angiogram of the brain and neck shows no large vessel stenosis or occlusion.  She has a pacemaker and not sure if it is MRI compatible  Vitals:   07/20/22 0700 07/20/22 0930 07/20/22 0953 07/20/22 1015  BP: 137/75 (!) 155/75  (!) 160/74  Pulse: 72 69  80  Resp: '16 13  18  '$ Temp:   98.1 F (36.7 C)   TempSrc:   Oral   SpO2: 95% 97%  96%   CBC:  Recent Labs  Lab 07/19/22 1636 07/20/22 0529  WBC 12.3* 10.7*  NEUTROABS 9.4*  --   HGB 13.7 13.4  HCT 40.7 39.7  MCV 92.1 91.3  PLT 177 161   Basic Metabolic Panel:  Recent Labs  Lab 07/19/22 1636 07/20/22 0529  NA 136 136  K 3.7 4.0  CL 99 102  CO2 26 24  GLUCOSE 127* 147*  BUN 16 14  CREATININE 0.74 0.74  CALCIUM 9.8 9.2   Lipid Panel:  Recent Labs  Lab 07/20/22 0529  CHOL 223*  TRIG 134  HDL 49  CHOLHDL 4.6  VLDL 27  LDLCALC 147*   HgbA1c:  Recent Labs  Lab 07/20/22 0529  HGBA1C 7.0*   Urine Drug Screen: No results for input(s): "LABOPIA", "COCAINSCRNUR", "LABBENZ", "AMPHETMU", "THCU", "LABBARB" in the last 168 hours.  Alcohol Level No results for input(s): "ETH" in the last 168 hours.  IMAGING past 24 hours VAS [place] LOWER EXTREMITY VENOUS (DVT)  Result Date: 07/20/2022  Lower Venous DVT Study Patient Name:  [name]  Date of Exam:   07/20/2022 Medical Rec #: [id number]      Accession #:    [id number] Date of Birth: 08-02-1951     Patient Gender: F Patient Age:   71 years Exam Location:  [facility] Procedure:      VAS [place] LOWER EXTREMITY VENOUS (DVT) Referring Phys: Gean Birchwood --------------------------------------------------------------------------------  Indications: Edema.  Performing Technologist: Maudry Mayhew RDMS, RVT, RDCS  Examination Guidelines: A complete evaluation includes B-mode imaging, spectral Doppler, color Doppler, and power Doppler as needed of all accessible portions of each vessel. Bilateral testing is considered an integral part of a complete examination. Limited examinations for reoccurring indications may be performed as noted. The reflux portion of the exam is performed with the patient in reverse Trendelenburg.  +---------+---------------+---------+-----------+----------+--------------+ RIGHT    CompressibilityPhasicitySpontaneityPropertiesThrombus Aging +---------+---------------+---------+-----------+----------+--------------+ CFV      Full           Yes      Yes                                 +---------+---------------+---------+-----------+----------+--------------+ SFJ      Full                                                        +---------+---------------+---------+-----------+----------+--------------+  FV Prox  Full                                                        +---------+---------------+---------+-----------+----------+--------------+ FV Mid   Full                                                        +---------+---------------+---------+-----------+----------+--------------+ FV DistalFull                                                        +---------+---------------+---------+-----------+----------+--------------+ PFV      Full                                                        +---------+---------------+---------+-----------+----------+--------------+ POP      Full           Yes      Yes                                  +---------+---------------+---------+-----------+----------+--------------+ PTV      Full                                                        +---------+---------------+---------+-----------+----------+--------------+ PERO     Full                                                        +---------+---------------+---------+-----------+----------+--------------+   +---------+---------------+---------+-----------+----------+--------------+ LEFT     CompressibilityPhasicitySpontaneityPropertiesThrombus Aging +---------+---------------+---------+-----------+----------+--------------+ CFV      Full           Yes      Yes                                 +---------+---------------+---------+-----------+----------+--------------+ SFJ      Full                                                        +---------+---------------+---------+-----------+----------+--------------+ FV Prox  Full                                                        +---------+---------------+---------+-----------+----------+--------------+  FV Mid   Full                                                        +---------+---------------+---------+-----------+----------+--------------+ FV DistalFull                                                        +---------+---------------+---------+-----------+----------+--------------+ PFV      Full                                                        +---------+---------------+---------+-----------+----------+--------------+ POP      Full           Yes      Yes                                 +---------+---------------+---------+-----------+----------+--------------+ PTV      Full                                                        +---------+---------------+---------+-----------+----------+--------------+ PERO     Full                                                         +---------+---------------+---------+-----------+----------+--------------+     Summary: BILATERAL: - No evidence of deep vein thrombosis seen in the lower extremities, bilaterally. -No evidence of popliteal cyst, bilaterally.   *See table(s) above for measurements and observations. Electronically signed by Deitra Mayo MD on 07/20/2022 at 1:00:35 PM.    Final    CT ANGIO HEAD NECK W WO CM  Result Date: 07/20/2022 CLINICAL DATA:  Stroke/TIA EXAM: CT ANGIOGRAPHY HEAD AND NECK TECHNIQUE: Multidetector CT imaging of the head and neck was performed using the standard protocol during bolus administration of intravenous contrast. Multiplanar CT image reconstructions and MIPs were obtained to evaluate the vascular anatomy. Carotid stenosis measurements (when applicable) are obtained utilizing NASCET criteria, using the distal internal carotid diameter as the denominator. RADIATION DOSE REDUCTION: This exam was performed according to the departmental dose-optimization program which includes automated exposure control, adjustment of the mA and/or kV according to patient size and/or use of iterative reconstruction technique. CONTRAST:  25m OMNIPAQUE IOHEXOL 350 MG/ML SOLN COMPARISON:  02/13/2019 FINDINGS: CTA NECK FINDINGS SKELETON: There is no bony spinal canal stenosis. No lytic or blastic lesion. OTHER NECK: Heterogeneous 2.1 cm right thyroid nodule. UPPER CHEST: No pneumothorax or pleural effusion. No nodules or masses. AORTIC ARCH: There is no calcific atherosclerosis of the aortic arch. There is no aneurysm, dissection or hemodynamically significant stenosis of the visualized portion of the aorta. Conventional  3 vessel aortic branching pattern. The visualized proximal subclavian arteries are widely patent. RIGHT CAROTID SYSTEM: Normal without aneurysm, dissection or stenosis. LEFT CAROTID SYSTEM: Normal without aneurysm, dissection or stenosis. VERTEBRAL ARTERIES: Left dominant configuration. Both origins are  clearly patent. There is no dissection, occlusion or flow-limiting stenosis to the skull base (V1-V3 segments). CTA HEAD FINDINGS POSTERIOR CIRCULATION: --Vertebral arteries: Normal V4 segments. --Inferior cerebellar arteries: Normal. --Basilar artery: Normal. --Superior cerebellar arteries: Normal. --Posterior cerebral arteries (PCA): Normal. ANTERIOR CIRCULATION: --Intracranial internal carotid arteries: Atherosclerotic calcification of the internal carotid arteries at the skull base without hemodynamically significant stenosis. --Anterior cerebral arteries (ACA): Normal. Both A1 segments are present. Patent anterior communicating artery (a-comm). --Middle cerebral arteries (MCA): Normal. VENOUS SINUSES: As permitted by contrast timing, patent. ANATOMIC VARIANTS: None Review of the MIP images confirms the above findings. IMPRESSION: 1. No emergent large vessel occlusion or hemodynamically significant stenosis of the head and neck. 2. Heterogeneous 2.1 cm right thyroid nodule. Recommend nonemergent thyroid US. Reference: J Am Coll Radiol. 2015 Feb;12(2): 143-50 Electronically Signed   By: Ulyses Jarred M.D.   On: 07/20/2022 03:23   CT Head Wo Contrast  Result Date: 07/19/2022 CLINICAL DATA:  Headache, new or worsening. High blood pressure and high blood sugar. Intermittent headaches for a month. EXAM: CT HEAD WITHOUT CONTRAST TECHNIQUE: Contiguous axial images were obtained from the base of the skull through the vertex without intravenous contrast. RADIATION DOSE REDUCTION: This exam was performed according to the departmental dose-optimization program which includes automated exposure control, adjustment of the mA and/or kV according to patient size and/or use of iterative reconstruction technique. COMPARISON:  08/06/2020 FINDINGS: Brain: Focal areas of encephalomalacia in the left occipital region consistent with old infarct and unchanged since prior study. New areas of encephalomalacia demonstrated in the  left thalamus, left caudate lobe, right cerebellum, and right centrum semiovale have developed since previous study. Appearance is likely to represent old lacunar infarcts. Acute lacunar changes are not excluded. Basal ganglia calcifications. No mass-effect or midline shift. No abnormal extra-axial fluid collections. Gray-white matter junctions are distinct. Basal cisterns are not effaced. No acute intracranial hemorrhage. Vascular: Intracranial arterial calcifications. Skull: Calvarium appears intact. Sinuses/Orbits: Paranasal sinuses and mastoid air cells are clear. Other: None. IMPRESSION: 1. 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 2. New foci of encephalomalacia consistent with lacunar infarcts demonstrated in the left thalamus, left caudate lobe, right cerebellum, and right symptoms in the ovale. 3. No acute intracranial hemorrhage or mass effect. Electronically Signed   By: Lucienne Capers M.D.   On: 07/19/2022 17:10   DG Chest 2 View  Result Date: 07/19/2022 CLINICAL DATA:  Confusion EXAM: CHEST - 2 VIEW COMPARISON:  11/06/2007 FINDINGS: LEFT-sided pacemaker overlies normal cardiac silhouette. No effusion, infiltrate or pneumothorax. Degenerative osteophytosis of the spine. IMPRESSION: No acute cardiopulmonary process. Electronically Signed   By: Suzy Bouchard M.D.   On: 07/19/2022 16:58    PHYSICAL EXAM General:  Alert, well-nourished, well-developed elderly Caucasian lady patient in no acute distress Respiratory:  Regular, unlabored respirations on room air  NEURO:  Mental Status: AA&Ox3  Speech/Language: speech is without dysarthria or aphasia.  Fluency, and comprehension intact.  Cranial Nerves:  II: PERRL. Visual fields full.  III, IV, VI: EOMI. Eyelids elevate symmetrically.  V: Sensation is intact to light touch and symmetrical to face.  VII: Smile is symmetrical.   VIII: hearing intact to voice. IX, X: Phonation is normal.  XII: tongue is  midline without fasciculations. Motor: 5/5 strength to RUE, RLE and LUE, 4+/5 to LLE with pain in the hip limiting movement Tone: is normal and bulk is normal Sensation- Intact to light touch bilaterally.  Coordination: FTN intact bilaterally.No drift.  Gait- deferred   ASSESSMENT/PLAN [name] is a 71 y.o. female with history of GERD, DM2, HLD and HTN presenting with acute onset weakness and heaviness of her left leg which began on Monday.  She presented to the ED yesterday with elevated blood pressure.  No MRI has been performed yet due to presence of pacemaker.  Will perform follow up CT instead.  Likely Stroke:  right sided small stroke not seen on CT.  MRI cannot be obtained due to pacer Etiology:  probable small vessel disease in the setting of uncontrolled risk factors  CT head No acute abnormality. Old lacunar infarcts in left thalamus, left caudate lobe, right cerebellum and right centrum ovale.  Old infarct in left occipital region. CTA head & neck no LVO or hemodynamically significant stenosis, right thyroid nodule Follow up CT pending 2D Echo pending BLE [place] pending LDL 147 HgbA1c 7.0 VTE prophylaxis - lovenox    Diet   Diet Heart Room service appropriate? Yes; Fluid consistency: Thin   aspirin 81 mg daily prior to admission, now on aspirin 81 mg daily and clopidogrel 75 mg daily for three weeks then clopidogrel indefinitely Therapy recommendations:  pending Disposition:  pending  Hypertension Home meds:  nebivolol 5 mg BID Stable Permissive hypertension (OK if < 220/120) but gradually normalize in 5-7 days Long-term BP goal normotensive  Hyperlipidemia Home meds: none LDL 147, goal < 70 Add rosuvastatin 20 mg daily  Continue statin at discharge  Diabetes type II Uncontrolled Home meds:  metformin 500 mg daily HgbA1c 7.0, goal < 7.0 CBGs Recent Labs    07/19/22 1634 07/20/22 0809 07/20/22 1153  GLUCAP 106* 132* 171*   Recommend close PCP follow  up for better diabetes control SSI  Other Stroke Risk Factors Advanced Age >/= 51  Hx stroke  Other Active Problems Right thyroid nodule Outpatient thyroid ultrasound  Hospital day # Laverne , MSN, AGACNP-BC [facility] See Amion for schedule and pager information 07/20/2022 2:18 PM   STROKE MD NOTE :  I have personally obtained history,examined this patient, reviewed notes, independently viewed imaging studies, participated in medical decision making and plan of care.ROS completed by me personally and pertinent positives fully documented  I have made any additions or clarifications directly to the above note. Agree with note above.  Presented with left leg pain and weakness unsure this is due to mechanical pain rather than right brain subcortical infarct which is not visualized on CT.  MRI cannot be obtained due to pacemaker.  Recommend continue ongoing stroke work-up and aggressive risk factor modification.  Recommend aspirin and Plavix for 3 weeks followed by Plavix alone and aggressive risk factor modification.  Mobilize out of bed.  Therapy consults.  Greater than 50% time during this 50-minute visit was spent in counseling and coordination of care about the leg weakness and discussion about stroke evaluation, prevention and treatment and answering questions.  Antony Contras, MD Medical Director [facility] Pager: [phone] 07/20/2022 5:11 PM   To contact Stroke Continuity provider, please refer to [email or web address] After hours, contact General Neurology

## 2022-07-20 NOTE — ED Notes (Signed)
Provided pt supplies to do bedside bath.

## 2022-07-20 NOTE — ED Notes (Signed)
Pt ambulated to restroom independently without difficulties. This RN attempted to place yellow hospital socks on pt, pt refused at this time.

## 2022-07-20 NOTE — Evaluation (Signed)
Speech Language Pathology Evaluation Patient Details Name: [name] MRN: [id number] DOB: March 17, 1951 Today's Date: 07/20/2022 Time: 8099-8338 SLP Time Calculation (min) (ACUTE ONLY): 25 min  Problem List:  Patient Active Problem List   Diagnosis Date Noted   Tingling in extremities 07/20/2022   Acute CVA (cerebrovascular accident) ([place]) 07/20/2022   GERD (gastroesophageal reflux disease) 11/25/2019   Anxiety 11/25/2019   Colon polyps 11/25/2019   Thyroid nodule 02/24/2019   Difficult airway for intubation 02/24/2019   Occipital stroke ([place]) 02/13/2019   Symptomatic bradycardia    BMI 29.0-29.9,adult 10/29/2016   Type 2 diabetes mellitus without complication, without long-term current use of insulin ([place]) 01/12/2016   Vitamin D deficiency 01/21/2015   Palpitations 04/15/2014   Arthritis    History of uterine cancer    Hyperlipidemia associated with type 2 diabetes mellitus ([place])    Hypertension associated with diabetes ([place])    Osteopenia    Esophageal stricture    Pacemaker    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   Past Medical History:  Past Medical History:  Diagnosis Date   Anxiety    Arthritis    of the neck   Colon polyps    Diabetes mellitus without complication ([place])    Esophageal stricture    Essential hypertension 01/05/2020   GERD (gastroesophageal reflux disease)    Hiatal Hernia   History of uterine cancer    Hyperlipidemia    Hypertension    Osteoporosis    Pacemaker    Symptomatic bradycardia    Past Surgical History:  Past Surgical History:  Procedure Laterality Date   APPENDECTOMY     CHOLECYSTECTOMY     LAPAROSCOPIC TOTAL HYSTERECTOMY     LYMPH NODE DISSECTION     bilateral pelvic and right-sided periaortic   PACEMAKER GENERATOR CHANGE N/A 05/21/2014   Procedure: PACEMAKER GENERATOR CHANGE;  Surgeon: Evans Lance, MD;  Location: [facility] CATH LAB;  Service: Cardiovascular;  Laterality: N/A;   status post pacemaker     metronic 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 female with history of hypertension, diabetes mellitus, hyperlipidemia, GERD has been experiencing some strange sensation in the left foot since Monday about 5 days ago.  Patient felt that her left foot was heavy and was not able to wear her shoes as usual.  Then the following day she noted that she had difficulty walking.  Her blood pressure was running high and decided to come to the ER on 07/19/22.  Denies any visual symptoms difficulty speaking or swallowing or any other weakness in the extremities. CT head on 07/20/31 indicated old infarct in the left occipital region is unchanged since prior  study.  2. New foci of encephalomalacia consistent with lacunar infarcts  demonstrated in the left thalamus, left caudate lobe, right  cerebellum, and right symptoms in the ovale.  3. No acute intracranial hemorrhage or mass effect.  SLE generated to assess speech/language and cognition.   Assessment / Plan / Recommendation Clinical Impression  Pt seen for speech/language/cognitive assessment with administration of [place] Mental Status Examination (SLUMS) with a score obtained of 26/30 with 27/30 being considered within functional limits.  Pt demonstrated difficulty with memory recall for objects with a time delay with pt only recalling 2/5 without a category/choice cue from SLP.  Pt recalled all 5 objects with assistance.  Pt able to answer questions from paragraph accurately, repeat digits backwards up to  4 digits, clock formation adequate and simple calculation task accurate during testing.  Speech 100% intelligible within complex conversation and OME unremarkable.  Pt's family/pt stated she was "forgetful" at home occasionally which matches screening performance with impaired recall after a time constraint without assistance provided.  Visual deficits noted from prior CVA, but pt able to compensate per family.  Discussed  test results with family/nurse with no recommendation for ST at this time.  Thank you for this consult.    SLP Assessment  SLP Recommendation/Assessment: Patient does not need any further Speech Language Pathology Services SLP Visit Diagnosis: Cognitive communication deficit (R41.841)    Recommendations for follow up therapy are one component of a multi-disciplinary discharge planning process, led by the attending physician.  Recommendations may be updated based on patient status, additional functional criteria and insurance authorization.    Follow Up Recommendations  No SLP follow up    Assistance Recommended at Discharge  Other (comment) (TBD)  Functional Status Assessment Patient has had a recent decline in their functional status and demonstrates the ability to make significant improvements in function in a reasonable and predictable amount of time.  Frequency and Duration  (evaluation only)         SLP Evaluation Cognition  Overall Cognitive Status: Within Functional Limits for tasks assessed Arousal/Alertness: Awake/alert Orientation Level: Oriented X4 Year: 2023 Month: August Day of Week: Correct Attention: Sustained Sustained Attention: Impaired Sustained Attention Impairment: Verbal basic;Functional basic Memory: Impaired Memory Impairment: Retrieval deficit;Decreased recall of new information Awareness: Appears intact Problem Solving: Appears intact Safety/Judgment: Appears intact       Comprehension  Auditory Comprehension Overall Auditory Comprehension: Appears within functional limits for tasks assessed Visual Recognition/Discrimination Discrimination: Within Function Limits Reading Comprehension Reading Status: Within funtional limits    Expression Expression Primary Mode of Expression: Verbal Verbal Expression Overall Verbal Expression: Appears within functional limits for tasks assessed Level of Generative/Spontaneous Verbalization:  Conversation Naming: No impairment Pragmatics: No impairment Non-Verbal Means of Communication: Not applicable Written Expression Dominant Hand: Right Written Expression: Within Functional Limits   Oral / Motor  Oral Motor/Sensory Function Overall Oral Motor/Sensory Function: Within functional limits Motor Speech Overall Motor Speech: Appears within functional limits for tasks assessed Respiration: Within functional limits Phonation: Normal Resonance: Within functional limits Articulation: Within functional limitis Intelligibility: Intelligible Motor Planning: Witnin functional limits            Elvina Sidle, M.S., CCC-SLP 07/20/2022, 3:13 PM

## 2022-07-20 NOTE — H&P (Signed)
History and Physical    [name]:[id number] DOB: [birth year] DOA: 07/19/2022  PCP: Janora Norlander, DO  Patient coming from: Home.  Chief Complaint: Elevated blood pressure.  Left foot weakness.  HPI: [name] is a 71 y.o. female with history of hypertension, diabetes mellitus, hyperlipidemia, GERD has been experiencing some strange sensation in the left foot since Monday about 5 days ago.  Patient felt that her left foot was heavy and was not able to wear her shoes as usual.  Then the following day she noted that she had difficulty walking.  Her blood pressure was running high and decided to come to the ER.  Denies any visual symptoms difficulty speaking or swallowing or any other weakness in the extremities.  ED Course: CT head in the ER shows features concerning for likely infarct involving multiple areas.  Neurologist was consulted and at this time admitted for further stroke work-up.  CT angiogram of the head and neck does not show any large vessel obstruction.  Review of Systems: As per HPI, rest all negative.   Past Medical History:  Diagnosis Date   Anxiety    Arthritis    of the neck   Colon polyps    Diabetes mellitus without complication ([place])    Esophageal stricture    Essential hypertension 01/05/2020   GERD (gastroesophageal reflux disease)    Hiatal Hernia   History of uterine cancer    Hyperlipidemia    Hypertension    Osteoporosis    Pacemaker    Symptomatic bradycardia     Past Surgical History:  Procedure Laterality Date   APPENDECTOMY     CHOLECYSTECTOMY     LAPAROSCOPIC TOTAL HYSTERECTOMY     LYMPH NODE DISSECTION     bilateral pelvic and right-sided periaortic   PACEMAKER GENERATOR CHANGE N/A 05/21/2014   Procedure: PACEMAKER GENERATOR CHANGE;  Surgeon: Evans Lance, MD;  Location: [facility] CATH LAB;  Service: Cardiovascular;  Laterality: N/A;   status post pacemaker     metronic [id number]   TRANSTHORACIC ECHOCARDIOGRAM  08/30/04    TUBAL LIGATION       reports that she has never smoked. She has never used smokeless tobacco. She reports that she does not drink alcohol and does not use drugs.  Allergies  Allergen Reactions   Glipizide Itching   Pravachol [Pravastatin Sodium] Other (See Comments)    Stomach pain and constipation     Family History  Problem Relation Age of Onset   Heart disease Mother    Heart attack Mother    Heart disease Father    Heart attack Father    Cancer Sister        breast   Breast cancer Sister    Alcohol abuse Brother    COPD Sister    Healthy Daughter     Prior to Admission medications   Medication Sig Start Date End Date Taking? Authorizing Provider  aspirin EC 81 MG EC tablet Take 1 tablet (81 mg total) by mouth daily. 02/16/19   Regalado, Belkys A, MD  Blood Glucose Monitoring Suppl (ONE TOUCH ULTRA 2) w/Device KIT 1 Device by Does not apply route daily. 09/17/17   Eustaquio Maize, MD  BYSTOLIC 5 MG tablet TAKE (1) TABLET TWICE A DAY. 03/23/22   Evans Lance, MD  calcium citrate-vitamin D (CITRACAL+D) 315-200 MG-UNIT per tablet Take 1 tablet by mouth daily.    [provider]  clobetasol ointment (TEMOVATE) 0.05 % Apply 1  application topically at bedtime for 42 days, THEN 1 application 2 (two) times a week. 02/07/22 03/21/23  Janora Norlander, DO  diclofenac Sodium (VOLTAREN) 1 % GEL Apply 4 g topically 4 (four) times daily. 08/04/21   Ronnie Doss M, DO  glucose blood test strip Use to check BG up to qd 09/02/17   Eustaquio Maize, MD  icosapent Ethyl (VASCEPA) 1 g capsule Take 2 capsules (2 g total) by mouth 2 (two) times daily. 02/06/22   Janora Norlander, DO  metFORMIN (GLUCOPHAGE) 500 MG tablet Take 1 tablet (500 mg total) by mouth daily with supper. 11/06/21   Ronnie Doss M, DO  ONETOUCH DELICA LANCETS FINE MISC Use to check BG up to qd 09/02/17   Eustaquio Maize, MD  pantoprazole (PROTONIX) 40 MG tablet Take 1 tablet (40 mg total) by mouth daily.  08/04/21   Janora Norlander, DO  sertraline (ZOLOFT) 50 MG tablet Take 1 tablet (50 mg total) by mouth daily. 08/04/21   Janora Norlander, DO    Physical Exam: Constitutional: Moderately built and nourished. Vitals:   07/20/22 0245 07/20/22 0345 07/20/22 0400 07/20/22 0430  BP:   (!) 145/72   Pulse: 79 77 71 69  Resp: _0 Temp:      TempSrc:      SpO2: 93% 97% 94% 94%   Eyes: Anicteric no pallor. ENMT: No discharge from the ears eyes nose and mouth. Neck: No mass felt.  No neck rigidity. Respiratory: No rhonchi or crepitations. Cardiovascular: S1-S2 heard. Abdomen: Soft nontender bowel sound present. Musculoskeletal: No edema. Skin: No rash. Neurologic: Alert awake oriented to time place and person.  Moving all extremities 5/5 but patient has difficulty to dorsiflex left foot.  No facial asymmetry pupils equal reacting to light. Psychiatric: Appears normal.  Normal affect.   Labs on Admission: I have personally reviewed following labs and imaging studies  CBC: Recent Labs  Lab 07/19/22 1636  WBC 12.3*  NEUTROABS 9.4*  HGB 13.7  HCT 40.7  MCV 92.1  PLT 161   Basic Metabolic Panel: Recent Labs  Lab 07/19/22 1636  NA 136  K 3.7  CL 99  CO2 26  GLUCOSE 127*  BUN 16  CREATININE 0.74  CALCIUM 9.8   GFR: Estimated Creatinine Clearance: 63.9 mL/min (by C-G formula based on SCr of 0.74 mg/dL). Liver Function Tests: No results for input(s): "AST", "ALT", "ALKPHOS", "BILITOT", "PROT", "ALBUMIN" in the last 168 hours. No results for input(s): "LIPASE", "AMYLASE" in the last 168 hours. No results for input(s): "AMMONIA" in the last 168 hours. Coagulation Profile: Recent Labs  Lab 07/19/22 2330  INR 1.0   Cardiac Enzymes: No results for input(s): "CKTOTAL", "CKMB", "CKMBINDEX", "TROPONINI" in the last 168 hours. BNP (last 3 results) No results for input(s): "PROBNP" in the last 8760 hours. HbA1C: No results for input(s): "HGBA1C" in the last 72  hours. CBG: Recent Labs  Lab 07/19/22 1634  GLUCAP 106*   Lipid Profile: No results for input(s): "CHOL", "HDL", "LDLCALC", "TRIG", "CHOLHDL", "LDLDIRECT" in the last 72 hours. Thyroid Function Tests: No results for input(s): "TSH", "T4TOTAL", "FREET4", "T3FREE", "THYROIDAB" in the last 72 hours. Anemia Panel: No results for input(s): "VITAMINB12", "FOLATE", "FERRITIN", "TIBC", "IRON", "RETICCTPCT" in the last 72 hours. Urine analysis:    Component Value Date/Time   COLORURINE STRAW (A) 07/19/2022 1538   APPEARANCEUR CLEAR 07/19/2022 1538   LABSPEC 1.005 07/19/2022 1538   PHURINE 6.0 07/19/2022 1538  GLUCOSEU NEGATIVE 07/19/2022 1538   HGBUR NEGATIVE 07/19/2022 1538   BILIRUBINUR NEGATIVE 07/19/2022 1538   BILIRUBINUR neg 08/25/2015 1551   KETONESUR NEGATIVE 07/19/2022 1538   PROTEINUR NEGATIVE 07/19/2022 1538   UROBILINOGEN negative 08/25/2015 1551   NITRITE NEGATIVE 07/19/2022 1538   LEUKOCYTESUR NEGATIVE 07/19/2022 1538   Sepsis Labs: _0 (procalcitonin:4,lacticidven:4) )No results found for this or any previous visit (from the past 240 hour(s)).   Radiological Exams on Admission: CT ANGIO HEAD NECK W WO CM  Result Date: 07/20/2022 CLINICAL DATA:  Stroke/TIA EXAM: CT ANGIOGRAPHY HEAD AND NECK TECHNIQUE: Multidetector CT imaging of the head and neck was performed using the standard protocol during bolus administration of intravenous contrast. Multiplanar CT image reconstructions and MIPs were obtained to evaluate the vascular anatomy. Carotid stenosis measurements (when applicable) are obtained utilizing NASCET criteria, using the distal internal carotid diameter as the denominator. RADIATION DOSE REDUCTION: This exam was performed according to the departmental dose-optimization program which includes automated exposure control, adjustment of the mA and/or kV according to patient size and/or use of iterative reconstruction technique. CONTRAST:  31m OMNIPAQUE IOHEXOL 350  MG/ML SOLN COMPARISON:  02/13/2019 FINDINGS: CTA NECK FINDINGS SKELETON: There is no bony spinal canal stenosis. No lytic or blastic lesion. OTHER NECK: Heterogeneous 2.1 cm right thyroid nodule. UPPER CHEST: No pneumothorax or pleural effusion. No nodules or masses. AORTIC ARCH: There is no calcific atherosclerosis of the aortic arch. There is no aneurysm, dissection or hemodynamically significant stenosis of the visualized portion of the aorta. Conventional 3 vessel aortic branching pattern. The visualized proximal subclavian arteries are widely patent. RIGHT CAROTID SYSTEM: Normal without aneurysm, dissection or stenosis. LEFT CAROTID SYSTEM: Normal without aneurysm, dissection or stenosis. VERTEBRAL ARTERIES: Left dominant configuration. Both origins are clearly patent. There is no dissection, occlusion or flow-limiting stenosis to the skull base (V1-V3 segments). CTA HEAD FINDINGS POSTERIOR CIRCULATION: --Vertebral arteries: Normal V4 segments. --Inferior cerebellar arteries: Normal. --Basilar artery: Normal. --Superior cerebellar arteries: Normal. --Posterior cerebral arteries (PCA): Normal. ANTERIOR CIRCULATION: --Intracranial internal carotid arteries: Atherosclerotic calcification of the internal carotid arteries at the skull base without hemodynamically significant stenosis. --Anterior cerebral arteries (ACA): Normal. Both A1 segments are present. Patent anterior communicating artery (a-comm). --Middle cerebral arteries (MCA): Normal. VENOUS SINUSES: As permitted by contrast timing, patent. ANATOMIC VARIANTS: None Review of the MIP images confirms the above findings. IMPRESSION: 1. No emergent large vessel occlusion or hemodynamically significant stenosis of the head and neck. 2. Heterogeneous 2.1 cm right thyroid nodule. Recommend nonemergent thyroid [place] Reference: J Am Coll Radiol. 2015 Feb;12(2): 143-50 Electronically Signed   By: KUlyses JarredM.D.   On: 07/20/2022 03:23   CT Head Wo  Contrast  Result Date: 07/19/2022 CLINICAL DATA:  Headache, new or worsening. High blood pressure and high blood sugar. Intermittent headaches for a month. EXAM: CT HEAD WITHOUT CONTRAST TECHNIQUE: Contiguous axial images were obtained from the base of the skull through the vertex without intravenous contrast. RADIATION DOSE REDUCTION: This exam was performed according to the departmental dose-optimization program which includes automated exposure control, adjustment of the mA and/or kV according to patient size and/or use of iterative reconstruction technique. COMPARISON:  08/06/2020 FINDINGS: Brain: Focal areas of encephalomalacia in the left occipital region consistent with old infarct and unchanged since prior study. New areas of encephalomalacia demonstrated in the left thalamus, left caudate lobe, right cerebellum, and right centrum semiovale have developed since previous study. Appearance is likely to represent old lacunar infarcts. Acute lacunar changes are not excluded. Basal  ganglia calcifications. No mass-effect or midline shift. No abnormal extra-axial fluid collections. Gray-white matter junctions are distinct. Basal cisterns are not effaced. No acute intracranial hemorrhage. Vascular: Intracranial arterial calcifications. Skull: Calvarium appears intact. Sinuses/Orbits: Paranasal sinuses and mastoid air cells are clear. Other: None. IMPRESSION: 1. 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 2. New foci of encephalomalacia consistent with lacunar infarcts demonstrated in the left thalamus, left caudate lobe, right cerebellum, and right symptoms in the ovale. 3. No acute intracranial hemorrhage or mass effect. Electronically Signed   By: Lucienne Capers M.D.   On: 07/19/2022 17:10   DG Chest 2 View  Result Date: 07/19/2022 CLINICAL DATA:  Confusion EXAM: CHEST - 2 VIEW COMPARISON:  11/06/2007 FINDINGS: LEFT-sided pacemaker overlies normal cardiac silhouette. No effusion,  infiltrate or pneumothorax. Degenerative osteophytosis of the spine. IMPRESSION: No acute cardiopulmonary process. Electronically Signed   By: Suzy Bouchard M.D.   On: 07/19/2022 16:58    EKG: Independently reviewed.  Normal sinus rhythm.  Assessment/Plan Principal Problem:   Acute CVA (cerebrovascular accident) [facility]) Active Problems:   Hyperlipidemia associated with type 2 diabetes mellitus ([place])   Hypertension associated with diabetes ([place])   Type 2 diabetes mellitus without complication, without long-term current use of insulin ([facility])   Thyroid nodule   Tingling in extremities    Acute CVA -appreciate neurology consult.  MRI was unable to be done at this time due to patient having pacemaker.  We will keep patient on neurochecks patient did pass stroke swallow.  Aspirin Plavix has been ordered by neurologist.  Check lipid panel hemoglobin A1c physical therapy consult.  Check Dopplers of the lower extremity. Hypertension allow for permissive hypertension given the stroke.  As needed IV hydralazine for systolic blood pressure more than 220. Diabetes mellitus type 2 last hemoglobin A1c was 6.72 months ago.  On sliding scale coverage. History of GERD on Protonix. History of sinus node dysfunction status post pacemaker placement.   DVT prophylaxis: Lovenox. Code Status: Full code. Family Communication: Discussed with patient. Disposition Plan: Home. Consults called: Neurology. Admission status: Observation.   Rise Patience MD [facility] Pager [phone].  If 7PM-7AM, please contact night-coverage [email or web address] Password [facility]  07/20/2022, 4:35 AM

## 2022-07-20 NOTE — ED Notes (Signed)
RN notified attending Vanna Scotland MD) to place diet for pt

## 2022-07-20 NOTE — ED Notes (Signed)
Pt requested to sit in chair next to bed. RN helped pt to chair and provided pt call bell

## 2022-07-20 NOTE — Progress Notes (Signed)
  Progress Note   Patient: [name] OMV:[id number] DOB: December 23, 1950 DOA: 07/19/2022     0 DOS: the patient was seen and examined on 07/20/2022   Brief hospital course: No notes on file  Assessment and Plan: Left foot heaviness #Acute CVA -appreciate neurology consult.   PM ?MRI compatible - Neuro eval Recommendations:  "- Recommend Vascular imaging with CT angio head and neck - Recommend obtaining TTE - Recommend obtaining Lipid panel with LDL - Please start statin if LDL > 70 - Recommend HbA1c - Antithrombotic -aspirin 81 mg daily along with Plavix 75 mg daily for 21 days, followed by aspirin 81 mg daily alone. - Recommend DVT ppx - SBP goal - permissive hypertension first 24 h < 220/110. Held home meds.  - Recommend Telemetry monitoring for arrythmia - Recommend bedside swallow screen prior to PO intake. - Stroke education booklet - Recommend PT/OT/SLP consult" - Check Dopplers of the lower extremity. - Hypertension allow for permissive hypertension given the stroke.  As needed IV hydralazine for systolic blood pressure more than 220.  Diabetes mellitus type 2  A1C pending   On sliding scale coverage. GERD Protonix. History of sinus node dysfunctio PPM     DVT prophylaxis: Lovenox. Code Status: Full code. Family Communication: Discussed with patient. Disposition Plan: Home. Consults called: Neurology. Admission status: Observation.      Subjective:  NAEON Seen by neurology PT/OT pending Unable to complete MRI bc PM non MRI compatible?   Physical Exam: Vitals:   07/20/22 0430 07/20/22 0529 07/20/22 0534 07/20/22 0600  BP:    139/64  Pulse: 69   65  Resp: 11   16  Temp:  98 F (36.7 C) 98 F (36.7 C)   TempSrc:  Oral Oral   SpO2: 94%   94%  General: Laying comfortably in bed; in no acute distress.  HENT: Normal oropharynx and mucosa. Normal external appearance of ears and nose.  Neck: Supple, no pain or tenderness  CV: No JVD. No peripheral edema.   Pulmonary: Symmetric Chest rise. Normal respiratory effort.  Abdomen: Soft to touch, non-tender.  Ext: No cyanosis, edema, or deformity  Skin: No rash. Normal palpation of skin.   Musculoskeletal: Normal digits and nails by inspection. No clubbing.  Data Reviewed:  There are no new results to review at this time.  Family Communication: none available  Disposition: Status is: Observation The patient remains OBS appropriate and will d/c before 2 midnights.  Planned Discharge Destination: Home    Time spent: 25 minutes  Author: Vanna Scotland, MD 07/20/2022 7:58 AM  For on call review [email or web address].

## 2022-07-20 NOTE — ED Notes (Signed)
CN notified to initiate purpleman.

## 2022-07-20 NOTE — Progress Notes (Signed)
Bilateral lower extremity venous duplex completed. Refer to "CV Proc" under chart review to view preliminary results.  07/20/2022 8:37 AM Kelby Aline., MHA, RVT, RDCS, RDMS

## 2022-07-20 NOTE — ED Notes (Signed)
RN assisted pt to restroom.

## 2022-07-20 NOTE — ED Notes (Signed)
Breakfast tray ordered for pt arriving at 1030

## 2022-07-20 NOTE — ED Notes (Signed)
RN provided pt with slip resistant socks

## 2022-07-20 NOTE — Evaluation (Signed)
Physical Therapy Evaluation Patient Details Name: [name] MRN: [id number] DOB: 03/22/1951 Today's Date: 07/20/2022  History of Present Illness  71 yo female with onset of L foot weakness was admitted on 8/3, noted new stroke findings on MRI in L thalamus, L caudate lobe, R cerebellum and R ovale.  New encephalomalacia, hypertension, but not aware of any vision changes.  PMHx:  HTN, DM, HLD, GERD, colon polyps, OA neck, pacemaker, bradycardia, uterine CA, osteoporosis  Clinical Impression  Pt is demonstrating some lateral instability and coordination changes on LLE with L ankle and ext  hall weakness.  Her control of gait improved with practice but was much better with the walker.  Follow up with her for regular therapy to improve her safety with walker, to increase balance control and protective responses as well as LLE strength.  Her plan is HHPT for further progression of goals, and will need RW for support of gait.  Family is expecting to help her with safety of mobility as well.  Follow along for goals of acute PT as outlined in POC.       Recommendations for follow up therapy are one component of a multi-disciplinary discharge planning process, led by the attending physician.  Recommendations may be updated based on patient status, additional functional criteria and insurance authorization.  Follow Up Recommendations Home health PT      Assistance Recommended at Discharge Intermittent Supervision/Assistance  Patient can return home with the following  A little help with walking and/or transfers;A little help with bathing/dressing/bathroom;Assistance with cooking/housework;Assist for transportation;Help with stairs or ramp for entrance    Equipment Recommendations Rolling walker (2 wheels)  Recommendations for Other Services       Functional Status Assessment Patient has had a recent decline in their functional status and demonstrates the ability to make significant improvements in  function in a reasonable and predictable amount of time.     Precautions / Restrictions Precautions Precautions: Fall Precaution Comments: L foot and ankle weak Restrictions Weight Bearing Restrictions: No      Mobility  Bed Mobility Overal bed mobility: Needs Assistance Bed Mobility: Supine to Sit, Sit to Supine     Supine to sit: Min guard Sit to supine: Min assist   General bed mobility comments: min assist back to bed for supporting legs    Transfers Overall transfer level: Needs assistance Equipment used: Rolling walker (2 wheels) Transfers: Sit to/from Stand Sit to Stand: Min guard           General transfer comment: min guard for safety    Ambulation/Gait Ambulation/Gait assistance: Min guard, Min assist Gait Distance (Feet): 20 Feet (100+20) Assistive device: 1 person hand held assist, Rolling walker (2 wheels) Gait Pattern/deviations: Step-through pattern, Decreased stride length, Decreased dorsiflexion - left Gait velocity: reduced Gait velocity interpretation: <1.31 ft/sec, indicative of household ambulator   General Gait Details: pt tends to have to exaggerate L foot clearance  Stairs            Wheelchair Mobility    Modified Rankin (Stroke Patients Only) Modified Rankin (Stroke Patients Only) Pre-Morbid Rankin Score: No symptoms Modified Rankin: Moderate disability     Balance Overall balance assessment: Needs assistance, History of Falls Sitting-balance support: Feet supported, Bilateral upper extremity supported Sitting balance-Leahy Scale: Fair     Standing balance support: Single extremity supported Standing balance-Leahy Scale: Fair  Pertinent Vitals/Pain Pain Assessment Pain Assessment: Faces Faces Pain Scale: Hurts little more Pain Location: coccyx Pain Descriptors / Indicators: Discomfort, Shooting Pain Intervention(s): Limited activity within patient's tolerance, Premedicated  before session, Repositioned    Home Living Family/patient expects to be discharged to:: Private residence Living Arrangements: Spouse/significant other Available Help at Discharge: Family;Available 24 hours/day Type of Home: House Home Access: Stairs to enter Entrance Stairs-Rails: Left Entrance Stairs-Number of Steps: 3 steps   Home Layout: One level Home Equipment: Cane - single point      Prior Function Prior Level of Function : Independent/Modified Independent             Mobility Comments: No AD ADLs Comments: Indep     Hand Dominance   Dominant Hand: Right    Extremity/Trunk Assessment   Upper Extremity Assessment Upper Extremity Assessment: Defer to OT evaluation    Lower Extremity Assessment Lower Extremity Assessment: LLE deficits/detail LLE Deficits / Details: 3+ DF and ext hall, 3 peroneals, LLE Coordination: decreased gross motor       Communication   Communication: No difficulties  Cognition Arousal/Alertness: Awake/alert Behavior During Therapy: WFL for tasks assessed/performed Overall Cognitive Status: Within Functional Limits for tasks assessed                                          General Comments General comments (skin integrity, edema, etc.): Pt is getting up to walk with help on HHA, then to use RW for home preparation    Exercises     Assessment/Plan    PT Assessment Patient needs continued PT services  PT Problem List Decreased strength;Decreased activity tolerance;Decreased balance;Decreased range of motion;Decreased mobility;Decreased coordination       PT Treatment Interventions DME instruction;Gait training;Functional mobility training;Stair training;Therapeutic activities;Therapeutic exercise;Balance training;Neuromuscular re-education;Patient/family education    PT Goals (Current goals can be found in the Care Plan section)  Acute Rehab PT Goals Patient Stated Goal: to get home and feel stronger PT  Goal Formulation: With patient/family Time For Goal Achievement: 08/03/22 Potential to Achieve Goals: Good    Frequency Min 4X/week     Co-evaluation               AM-PAC PT "6 Clicks" Mobility  Outcome Measure Help needed turning from your back to your side while in a flat bed without using bedrails?: None Help needed moving from lying on your back to sitting on the side of a flat bed without using bedrails?: A Little Help needed moving to and from a bed to a chair (including a wheelchair)?: A Little Help needed standing up from a chair using your arms (e.g., wheelchair or bedside chair)?: A Little Help needed to walk in hospital room?: A Little Help needed climbing 3-5 steps with a railing? : A Lot 6 Click Score: 18    End of Session   Activity Tolerance: Patient tolerated treatment well Patient left: in bed;with call bell/phone within reach;with family/visitor present Nurse Communication: Mobility status PT Visit Diagnosis: Unsteadiness on feet (R26.81);Muscle weakness (generalized) (M62.81);Other abnormalities of gait and mobility (R26.89);History of falling (Z91.81)    Time: 6283-1517 PT Time Calculation (min) (ACUTE ONLY): 25 min   Charges:   PT Evaluation $PT Eval Moderate Complexity: 1 Mod PT Treatments $Gait Training: 8-22 mins       Ramond Dial 07/20/2022, 3:55 PM  Mee Hives, PT PhD Acute Rehab  Dept. Number: [place] [facility] [place]

## 2022-07-20 NOTE — Evaluation (Signed)
Occupational Therapy Evaluation Patient Details Name: [name] MRN: [id number] DOB: 1951/11/24 Today's Date: 07/20/2022   History of Present Illness 71 yo female with onset of L foot weakness was admitted on 8/3, noted new stroke findings on MRI in L thalamus, L caudate lobe, R cerebellum and R ovale.  New encephalomalacia, hypertension, but not aware of any vision changes.  PMHx:  HTN, DM, HLD, GERD, colon polyps, OA neck, pacemaker, bradycardia, uterine CA, osteoporosis   Clinical Impression   Pt admitted for concerns listed above. PTA pt reported that she was independent with all ADL's and IADL's. At this time, pt is limited by mild L visual field deficits, which are affecting her balance and ability to complete ADL's/functional mobility with independence. Her strength and cognition are [facility]. Recommending OP OT to maximize her independence and safety. OT will follow acutely.       Recommendations for follow up therapy are one component of a multi-disciplinary discharge planning process, led by the attending physician.  Recommendations may be updated based on patient status, additional functional criteria and insurance authorization.   Follow Up Recommendations  Outpatient OT    Assistance Recommended at Discharge Set up Supervision/Assistance  Patient can return home with the following A little help with walking and/or transfers;A little help with bathing/dressing/bathroom;Assistance with cooking/housework    Functional Status Assessment  Patient has had a recent decline in their functional status and demonstrates the ability to make significant improvements in function in a reasonable and predictable amount of time.  Equipment Recommendations  None recommended by OT    Recommendations for Other Services       Precautions / Restrictions Precautions Precautions: Fall Restrictions Weight Bearing Restrictions: No      Mobility Bed Mobility Overal bed mobility: Modified  Independent                  Transfers Overall transfer level: Needs assistance Equipment used: Rolling walker (2 wheels) Transfers: Sit to/from Stand Sit to Stand: Min guard           General transfer comment: min guard for safety      Balance Overall balance assessment: Mild deficits observed, not formally tested                                         ADL either performed or assessed with clinical judgement   ADL Overall ADL's : Needs assistance/impaired                                       General ADL Comments: Min guard due to L visual field deficit     Vision Baseline Vision/History: 1 Wears glasses Ability to See in Adequate Light: 0 Adequate Patient Visual Report: No change from baseline Vision Assessment?: Vision impaired- to be further tested in functional context Additional Comments: Pt having decreased awareness of L visual field     Perception     Praxis      Pertinent Vitals/Pain Pain Assessment Pain Assessment: No/denies pain     Hand Dominance Right   Extremity/Trunk Assessment Upper Extremity Assessment Upper Extremity Assessment: Overall WFL for tasks assessed   Lower Extremity Assessment Lower Extremity Assessment: Defer to PT evaluation   Cervical / Trunk Assessment Cervical / Trunk Assessment: Normal   Communication Communication  Communication: No difficulties   Cognition Arousal/Alertness: Awake/alert Behavior During Therapy: WFL for tasks assessed/performed Overall Cognitive Status: Within Functional Limits for tasks assessed                                       General Comments  VSS on rA    Exercises     Shoulder Instructions      Home Living Family/patient expects to be discharged to:: Private residence Living Arrangements: Spouse/significant other Available Help at Discharge: Family;Available 24 hours/day Type of Home: House Home Access: Stairs to  enter [employer] of Steps: 3 steps Entrance Stairs-Rails: Left Home Layout: One level     Bathroom Shower/Tub: Teacher, early years/pre: Handicapped height Bathroom Accessibility: No   Home Equipment: Cane - single point          Prior Functioning/Environment Prior Level of Function : Independent/Modified Independent             Mobility Comments: No AD ADLs Comments: Indep        OT Problem List: Decreased strength;Impaired balance (sitting and/or standing);Impaired vision/perception      OT Treatment/Interventions: Self-care/ADL training;Therapeutic exercise;Energy conservation;DME and/or AE instruction;Therapeutic activities;Visual/perceptual remediation/compensation;Patient/family education;Balance training    OT Goals(Current goals can be found in the care plan section) Acute Rehab OT Goals Patient Stated Goal: To go home OT Goal Formulation: With patient Time For Goal Achievement: 08/03/22 Potential to Achieve Goals: Good ADL Goals Pt Will Perform Grooming: Independently;standing Pt Will Perform Lower Body Bathing: Independently;sit to/from stand;sitting/lateral leans Pt Will Perform Lower Body Dressing: Independently;sitting/lateral leans;sit to/from stand Pt Will Transfer to Toilet: Independently;ambulating Pt Will Perform Toileting - Clothing Manipulation and hygiene: Independently;sitting/lateral leans;sit to/from stand  OT Frequency: Min 2X/week    Co-evaluation              AM-PAC OT "6 Clicks" Daily Activity     Outcome Measure Help from another person eating meals?: A Little Help from another person taking care of personal grooming?: A Little Help from another person toileting, which includes using toliet, bedpan, or urinal?: A Little Help from another person bathing (including washing, rinsing, drying)?: A Little Help from another person to put on and taking off regular upper body clothing?: A Little Help from another  person to put on and taking off regular lower body clothing?: A Little 6 Click Score: 18   End of Session Equipment Utilized During Treatment: Gait belt Nurse Communication: Mobility status  Activity Tolerance: Patient tolerated treatment well Patient left: in bed;with call bell/phone within reach  OT Visit Diagnosis: Unsteadiness on feet (R26.81);Other abnormalities of gait and mobility (R26.89);Muscle weakness (generalized) (M62.81)                Time: 0174-9449 OT Time Calculation (min): 20 min Charges:  OT General Charges $OT Visit: 1 Visit OT Evaluation $OT Eval Moderate Complexity: 1 Mod  Pebbles Zeiders H., OTR/L Acute Rehabilitation  Dorothye Berni Elane Yolanda Bonine 07/20/2022, 8:38 PM

## 2022-07-20 NOTE — Progress Notes (Signed)
  Echocardiogram 2D Echocardiogram has been performed.  Eartha Inch 07/20/2022, 11:25 AM

## 2022-07-20 NOTE — ED Notes (Signed)
Pt with PT. 

## 2022-07-20 NOTE — ED Notes (Signed)
Pt received lunch tray family at bedside,.

## 2022-07-20 NOTE — ED Notes (Signed)
RN assisted pt to restroom. Pt ambulated with steady gait.

## 2022-07-20 NOTE — ED Notes (Signed)
Pt sitting in chair at bedside.   Pt stated MD informed her if she is walking well she can go home.

## 2022-07-21 ENCOUNTER — Observation Stay (HOSPITAL_COMMUNITY): Payer: PPO

## 2022-07-21 DIAGNOSIS — I639 Cerebral infarction, unspecified: Secondary | ICD-10-CM | POA: Diagnosis not present

## 2022-07-21 DIAGNOSIS — R519 Headache, unspecified: Secondary | ICD-10-CM | POA: Diagnosis not present

## 2022-07-21 DIAGNOSIS — R531 Weakness: Secondary | ICD-10-CM | POA: Diagnosis not present

## 2022-07-21 LAB — GLUCOSE, CAPILLARY
Glucose-Capillary: 167 mg/dL — ABNORMAL HIGH (ref 70–99)
Glucose-Capillary: 222 mg/dL — ABNORMAL HIGH (ref 70–99)

## 2022-07-21 MED ORDER — ASPIRIN 81 MG PO TBEC: 81.0000 mg | DELAYED_RELEASE_TABLET | Freq: Every day | ORAL | 0 refills | Status: DC

## 2022-07-21 MED ORDER — LINAGLIPTIN 5 MG PO TABS: 5.0000 mg | ORAL_TABLET | Freq: Every day | ORAL | 0 refills | Status: DC

## 2022-07-21 MED ORDER — CLOPIDOGREL BISULFATE 75 MG PO TABS: 75.0000 mg | ORAL_TABLET | Freq: Every day | ORAL | 0 refills | Status: DC

## 2022-07-21 MED ORDER — METFORMIN HCL 500 MG PO TABS: 500.0000 mg | ORAL_TABLET | Freq: Every day | ORAL | 0 refills | Status: DC

## 2022-07-21 MED ORDER — ROSUVASTATIN CALCIUM 20 MG PO TABS: 20.0000 mg | ORAL_TABLET | Freq: Every day | ORAL | 0 refills | Status: DC

## 2022-07-21 NOTE — TOC Transition Note (Addendum)
Transition of Care [facility]) - CM/SW Discharge Note   Patient Details  Name: [name] MRN: [id number] Date of Birth: [birth year]  [facility] CM/SW Contact:  Carles Collet, RN Phone Number: 07/21/2022, 11:01 AM   Clinical Narrative:    Spoke w patient at bedside. RW will be delivered to the room [facility] set up with [facility]. No other TOC needs  PT recs changed post DC order and consult to rollator. [employer] able to change equipment out, however this will delay delivery, hopefully will be within an hour or so to the room    Final next level of care: [place] Barriers to Discharge: No Barriers Identified   Patient Goals and CMS Choice Patient states their goals for this hospitalization and ongoing recovery are:: to go home CMS Medicare.gov Compare Post Acute Care list provided to:: Patient Choice offered to / list presented to : Patient  Discharge Placement                       Discharge Plan and Services                DME Arranged: Walker rolling DME Agency: [employer] Date DME Agency Contacted: 07/21/22 Time DME Agency Contacted: 13 Representative spoke with at DME Agency: [place]: PT, OT HH Agency: [place] Date [place]: 07/21/22 Time [place]: 55 Representative spoke with at [place]: Amy  Social Determinants of Health ([place]) Interventions     Readmission Risk Interventions     No data to display

## 2022-07-21 NOTE — Care Management Obs Status (Signed)
[place] NOTIFICATION   Patient Details  Name: [name] MRN: [id number] Date of Birth: 07-21-1951   Medicare Observation Status Notification Given:  Yes    Carles Collet, RN 07/21/2022, 10:59 AM

## 2022-07-21 NOTE — Progress Notes (Signed)
Physical Therapy Treatment Patient Details Name: [name] MRN: [id number] DOB: 1951/10/09 Today's Date: 07/21/2022   History of Present Illness 71 yo female with onset of L foot weakness was admitted on 8/3, noted new stroke findings on MRI in L thalamus, L caudate lobe, R cerebellum and R ovale.  New encephalomalacia, hypertension, but not aware of any vision changes.  PMHx:  HTN, DM, HLD, GERD, colon polyps, OA neck, pacemaker, bradycardia, uterine CA, osteoporosis    PT Comments    Pt received in chair, agreeable to therapy session with encouragement, with emphasis on fall risk prevention, gait and stair training. Pt needing intermittent minA (mostly while performing higher level balance activities during Dynamic Gait Index) and consistent min guard for longer household distance gait task without AD. Pt states she did not want to use RW then later c/o fatigue in LE and states she would be agreeable to rollator at home. Pt scored 10/24 on Dynamic Gait Index. Scores of 19 or less are predictive of falls in older community living adults. DME recommendations updated below per discussion with pt and supervising PT Arby Barrette, case manager notified.  Recommendations for follow up therapy are one component of a multi-disciplinary discharge planning process, led by the attending physician.  Recommendations may be updated based on patient status, additional functional criteria and insurance authorization.  Follow Up Recommendations  Home health PT     Assistance Recommended at Discharge Intermittent Supervision/Assistance  Patient can return home with the following A little help with walking and/or transfers;A little help with bathing/dressing/bathroom;Assistance with cooking/housework;Assist for transportation;Help with stairs or ramp for entrance   Equipment Recommendations  Rollator (4 wheels)    Recommendations for Other Services       Precautions / Restrictions Precautions Precautions:  Fall Precaution Comments: L foot and ankle weak Restrictions Weight Bearing Restrictions: No     Mobility  Bed Mobility               General bed mobility comments: received in chair    Transfers Overall transfer level: Needs assistance Equipment used: None Transfers: Sit to/from Stand Sit to Stand: Supervision                Ambulation/Gait Ambulation/Gait assistance: Min guard, Min assist Gait Distance (Feet): 200 Feet Assistive device: None, 1 person hand held assist Gait Pattern/deviations: Decreased stride length, Decreased dorsiflexion - left, Drifts right/left, Step-through pattern Gait velocity: reduced Gait velocity interpretation: <1.8 ft/sec, indicate of risk for recurrent falls   General Gait Details: pt not agreeable to trial RW, but consistently reaching out for furniture support in room and at times asking for HHA in hallway during DGI tasks (stepping over obstacle) and with very slow, guarded gait (see DGI)   Stairs Stairs: Yes Stairs assistance: Min assist Stair Management: One rail Right, Step to pattern, Forwards Number of Stairs: 2 General stair comments: cues for step sequencing, activity pacing, safe guarding positions   Wheelchair Mobility    Modified Rankin (Stroke Patients Only) Modified Rankin (Stroke Patients Only) Pre-Morbid Rankin Score: No symptoms Modified Rankin: Moderate disability     Balance Overall balance assessment: Needs assistance Sitting-balance support: No upper extremity supported, Feet supported Sitting balance-Leahy Scale: Normal     Standing balance support: No upper extremity supported Standing balance-Leahy Scale: Poor Standing balance comment: LOB with challenges, needing single UE support for stepping over glove box and stopping to look to L/R sides  Standardized Balance Assessment Standardized Balance Assessment : Dynamic Gait Index   Dynamic Gait Index Level Surface: Mild  Impairment Change in Gait Speed: Mild Impairment Gait with Horizontal Head Turns: Severe Impairment Gait with Vertical Head Turns: Moderate Impairment Gait and Pivot Turn: Normal Step Over Obstacle: Severe Impairment Step Around Obstacles: Moderate Impairment Steps: Moderate Impairment Total Score: 10      Cognition Arousal/Alertness: Awake/alert Behavior During Therapy: WFL for tasks assessed/performed Overall Cognitive Status: No family/caregiver present to determine baseline cognitive functioning                                 General Comments: Pt stated that the Dr. had told her this morning to get up and walk during the day.  Nursing also confirmed this.  Educate pt that this is true, but only with nursing or therapy at this time. Chair alarm placed for safety after PT session. Pt initially resistant to AD but agreeable to rollator with education on fall risk prevention and energy conservation.        Exercises      General Comments General comments (skin integrity, edema, etc.): VSS on RA      Pertinent Vitals/Pain Pain Assessment Pain Assessment: No/denies pain    Home Living                          Prior Function            PT Goals (current goals can now be found in the care plan section) Acute Rehab PT Goals Patient Stated Goal: to get home and feel stronger PT Goal Formulation: With patient/family Time For Goal Achievement: 08/03/22 Progress towards PT goals: Progressing toward goals    Frequency    Min 4X/week      PT Plan Equipment recommendations need to be updated    Co-evaluation              AM-PAC PT "6 Clicks" Mobility   Outcome Measure  Help needed turning from your back to your side while in a flat bed without using bedrails?: None Help needed moving from lying on your back to sitting on the side of a flat bed without using bedrails?: A Little Help needed moving to and from a bed to a chair (including  a wheelchair)?: A Little Help needed standing up from a chair using your arms (e.g., wheelchair or bedside chair)?: A Little Help needed to walk in hospital room?: A Little Help needed climbing 3-5 steps with a railing? : A Lot (mod cues) 6 Click Score: 18    End of Session Equipment Utilized During Treatment: Gait belt Activity Tolerance: Patient tolerated treatment well Patient left: in chair;with call bell/phone within reach;with chair alarm set Nurse Communication: Mobility status PT Visit Diagnosis: Unsteadiness on feet (R26.81);Muscle weakness (generalized) (M62.81);Other abnormalities of gait and mobility (R26.89);History of falling (Z91.81)     Time: 1111-1130 PT Time Calculation (min) (ACUTE ONLY): 19 min  Charges:  $Gait Training: 8-22 mins                     Aristotelis Vilardi P., PTA Acute Rehabilitation Services Secure Chat Preferred 9a-5:30pm Office: [place] 07/21/2022, 11:48 AM

## 2022-07-21 NOTE — Discharge Instructions (Signed)
You need a thyroid ultrasound ordered by a PCP within the next 1 to 2 weeks.  Follow with Primary MD Janora Norlander, DO in 7 days   Get CBC, CMP, 2 view Chest X ray -  checked next visit within 1 week by Primary MD   Activity: As tolerated with Full fall precautions use walker/cane & assistance as needed  Disposition Home    Diet: Heart Healthy Low Carb  Accuchecks 4 times/day, Once in AM empty stomach and then before each meal. Log in all results and show them to your Prim.MD in 3 days. If any glucose reading is under 80 or above 300 call your Prim MD immidiately. Follow Low glucose instructions for glucose under 80 as instructed.   Special Instructions: If you have smoked or chewed Tobacco  in the last 2 yrs please stop smoking, stop any regular Alcohol  and or any Recreational drug use.  On your next visit with your primary care physician please Get Medicines reviewed and adjusted.  Please request your Prim.MD to go over all Hospital Tests and Procedure/Radiological results at the follow up, please get all Hospital records sent to your Prim MD by signing hospital release before you go home.  If you experience worsening of your admission symptoms, develop shortness of breath, life threatening emergency, suicidal or homicidal thoughts you must seek medical attention immediately by calling 911 or calling your MD immediately  if symptoms less severe.  You Must read complete instructions/literature along with all the possible adverse reactions/side effects for all the Medicines you take and that have been prescribed to you. Take any new Medicines after you have completely understood and accpet all the possible adverse reactions/side effects.

## 2022-07-21 NOTE — Discharge Summary (Signed)
[name] UOR:[id number] DOB: 03-Nov-1951 DOA: 07/19/2022  PCP: Janora Norlander, DO  Admit date: 07/19/2022  Discharge date: 07/21/2022  Admitted From: Home   Disposition:  Home   Recommendations for Outpatient Follow-up:   Follow up with PCP in 1-2 weeks  PCP Please obtain BMP/CBC, 2 view CXR in 1week,  (see Discharge instructions)   PCP Please follow up on the following pending results: Needs outpatient thyroid ultrasound in 1 to 2 weeks, close outpatient neurology follow-up.   Home Health: PT, OT if she qualifies Equipment/Devices: as below  Consultations: Neuro Discharge Condition: Stable    CODE STATUS: Full    Diet Recommendation: Heart Healthy Low Carb    Chief Complaint  Patient presents with   Headache     Brief history of present illness from the day of admission and additional interim summary     71 y.o. female with history of hypertension, diabetes mellitus, hyperlipidemia, GERD has been experiencing some strange sensation in the left foot since Monday about 5 days ago.  Patient felt that her left foot was heavy and was not able to wear her shoes as usual.  Then the following day she noted that she had difficulty walking, further work-up was consistent with acute stroke.                                                                 Hospital Course     Some difficulty in walking with left foot weakness due to new foci of encephalomalacia consistent with lacunar infarcts demonstrated in the left thalamus, left caudate lobe, right cerebellum, and right symptoms in the ovale on CT scan - MRI could not be done due to permanent pacemaker.  She is seen by stroke team.  Plan is 21 days of dual antiplatelet therapy thereafter Plavix only, continue statin indefinitely, LDL was above goal, A1c was above  goal.  PCP to monitor secondary risk factors for CVA in the outpatient setting.  Symptoms have largely resolved except some subjective left foot weakness for which she will get home PT OT and walker.  Echocardiogram and lower extremity venous duplex were unremarkable.  CTA of the head was nonacute as well.  Patient is close to baseline but still has some subjective weakness in the left foot.  Follow-up with PCP and neurology.   Incidental finding of thyroid nodule.  Needs dedicated thyroid ultrasound within 1 to 2 weeks to be directed by PCP.   DM. type II A1c 7.  On Glucophage which will be resumed in 2 days, Tradjenta added.  She has testing supplies requested to check CBGs q. [facility] and follow-up with PCP for further adjustment.  Hypertension.  Currently permissive hypertension thereafter home regimen.  PCP to monitor closely.  GERD.  PPI.  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  Follow with Primary MD Janora Norlander, DO in 7 days   Get CBC, CMP, 2 view Chest X ray -  checked next visit within 1 week by Primary MD   Activity: As tolerated with Full fall precautions use walker/cane & assistance as needed  Disposition Home    Diet: Heart Healthy Low Carb  Accuchecks 4 times/day, Once in AM empty stomach and then before each meal. Log in all results and show them to your Prim.MD in 3 days. If  any glucose reading is under 80 or above 300 call your Prim MD immidiately. Follow Low glucose instructions for glucose under 80 as instructed.   Special Instructions: If you have smoked or chewed Tobacco  in the last 2 yrs please stop smoking, stop any regular Alcohol  and or any Recreational drug use.  On your next visit with your primary care physician please Get Medicines reviewed and adjusted.  Please request your Prim.MD to go over all Hospital Tests and Procedure/Radiological results at the follow up, please get all Hospital records sent to your Prim MD by signing hospital release before you go home.  If you experience worsening of your admission symptoms, develop shortness of breath, life threatening emergency, suicidal or homicidal thoughts you must seek medical attention immediately by calling 911 or calling your MD immediately  if symptoms less severe.  You Must read complete instructions/literature along with all the possible adverse reactions/side effects for all the Medicines you take and that have been prescribed to you. Take any new Medicines after you have completely understood and accpet all the possible adverse reactions/side effects.   For home use only DME 4 wheeled rolling walker with seat   Complete by: As directed    Patient needs a walker to treat with the following condition: CVA (cerebral vascular accident) ([place])   Increase activity slowly   Complete by: As directed        Discharge Medications   Allergies as of 07/21/2022       Reactions   Glipizide Itching   Pravachol [pravastatin Sodium] Other (See Comments)   Stomach pain and constipation        Medication List     STOP taking these medications    icosapent Ethyl 1 g capsule Commonly known as: Vascepa       TAKE these medications    aspirin EC 81 MG tablet Take 1 tablet (81 mg total) by mouth daily.   Bystolic 5 MG tablet Generic drug: nebivolol TAKE (1) TABLET TWICE A DAY.   calcium  citrate-vitamin D 315-200 MG-UNIT tablet Commonly known as: CITRACAL+D Take 1 tablet by mouth daily.   carboxymethylcellulose 0.5 % Soln Commonly known as: REFRESH PLUS Place 1 drop into both eyes daily as needed (dry eyes).   clobetasol ointment 0.05 % Commonly known as: TEMOVATE Apply 1  application topically at bedtime for 42 days, THEN 1 application 2 (two) times a week. Start taking on: February 07, 2022   clopidogrel 75 MG tablet Commonly known as: PLAVIX Take 1 tablet (75 mg total) by mouth daily. Start taking on: July 22, 2022   COLLAGEN PO Take 0.5 Scoops by mouth daily.   diclofenac Sodium 1 % Gel Commonly known as: Voltaren Apply 4 g topically 4 (four) times daily. What changed:  when to take this reasons to take this   glucose blood test strip Use to check BG up to qd   linagliptin 5 MG Tabs tablet Commonly known as: TRADJENTA Take 1 tablet (5 mg total) by mouth daily.   metFORMIN 500 MG tablet Commonly known as: GLUCOPHAGE Take 1 tablet (500 mg total) by mouth daily with supper. Start taking on: July 23, 2022 What changed: These instructions start on July 23, 2022. If you are unsure what to do until then, ask your doctor or other care provider.   ONE TOUCH ULTRA 2 w/Device Kit 1 Device by Does not apply route daily.   OneTouch Delica Lancets Fine Misc Use to check BG up to qd   pantoprazole 40 MG tablet Commonly known as: PROTONIX Take 1 tablet (40 mg total) by mouth daily.   rosuvastatin 20 MG tablet Commonly known as: CRESTOR Take 1 tablet (20 mg total) by mouth daily. Start taking on: July 22, 2022   sertraline 50 MG tablet Commonly known as: ZOLOFT Take 1 tablet (50 mg total) by mouth daily. What changed:  how much to take when to take this               Durable Medical Equipment  (From admission, onward)           Start     Ordered   07/21/22 0000  For home use only DME 4 wheeled rolling walker with seat        Question:  Patient needs a walker to treat with the following condition  Answer:  CVA (cerebral vascular accident) ([place])   07/21/22 1036             Follow-up Information     Ronnie Doss M, DO. Schedule an appointment as soon as possible for a visit in 1 week(s).   Specialty: Family Medicine Contact information: [place] [address] [phone]         [facility]. Schedule an appointment as soon as possible for a visit in 3 week(s).   Contact information: [address]     [place] [address] [phone]                Major procedures and Radiology Reports - PLEASE review detailed and final reports thoroughly  -       CT HEAD WO CONTRAST (5MM)  Result Date: 07/21/2022 CLINICAL DATA:  Stroke follow-up.  Headache EXAM: CT HEAD WITHOUT CONTRAST TECHNIQUE: Contiguous axial images were obtained from the base of the skull through the vertex without intravenous contrast. RADIATION DOSE REDUCTION: This exam was performed according to the departmental dose-optimization program which includes automated exposure control, adjustment of the mA and/or kV according to patient size and/or use of iterative reconstruction technique. COMPARISON:  Two days ago FINDINGS: Brain: Established infarcts in the left occipital lobe, right posterior frontal white matter, and left anterior centrum semiovale. Less well demonstrated left thalamic lacune. Small bilateral cerebellar infarcts. No evidence of progression or hemorrhage.  Vascular: No hyperdense vessel or unexpected calcification. Skull: Normal. Negative for fracture or focal lesion. Sinuses/Orbits: No acute finding. IMPRESSION: Established infarcts without progression or hemorrhage. Electronically Signed   By: Jorje Guild M.D.   On: 07/21/2022 08:43   ECHOCARDIOGRAM COMPLETE  Result Date: 07/20/2022    ECHOCARDIOGRAM REPORT   Patient Name:   MARIONA SCHOLES Date of  Exam: 07/20/2022 Medical Rec #:  [id number]     Height:       64.0 in Accession #:    [id number]    Weight:       160.0 lb Date of Birth:  [birth year]    BSA:          1.779 m Patient Age:    71 years      BP:           160/74 mmHg Patient Gender: F             HR:           76 bpm. Exam Location:  Inpatient Procedure: 2D Echo, Color Doppler and Cardiac Doppler Indications:    Stroke I63.9  History:        Patient has prior history of Echocardiogram examinations, most                 recent 02/14/2019. Pacemaker, Arrythmias:Bradycardia; Risk                 Factors:Hypertension, Dyslipidemia and Diabetes.  Sonographer:    Eartha Inch Referring Phys: [place]  1. Left ventricular ejection fraction, by estimation, is 60 to 65%. The left ventricle has normal function. The left ventricle has no regional wall motion abnormalities. Left ventricular diastolic parameters are consistent with Grade I diastolic dysfunction (impaired relaxation).  2. Right ventricular systolic function is normal. The right ventricular size is normal. There is normal pulmonary artery systolic pressure. The estimated right ventricular systolic pressure is 53.6 mmHg.  3. Left atrial size was mildly dilated.  4. The mitral valve is normal in structure. No evidence of mitral valve regurgitation. No evidence of mitral stenosis.  5. The aortic valve is tricuspid. Aortic valve regurgitation is not visualized. No aortic stenosis is present.  6. The inferior vena cava is normal in size with greater than 50% respiratory variability, suggesting right atrial pressure of 3 mmHg. FINDINGS  Left Ventricle: Left ventricular ejection fraction, by estimation, is 60 to 65%. The left ventricle has normal function. The left ventricle has no regional wall motion abnormalities. The left ventricular internal cavity size was normal in size. There is  no left ventricular hypertrophy. Left ventricular diastolic parameters are consistent with  Grade I diastolic dysfunction (impaired relaxation). Right Ventricle: The right ventricular size is normal. No increase in right ventricular wall thickness. Right ventricular systolic function is normal. There is normal pulmonary artery systolic pressure. The tricuspid regurgitant velocity is 2.69 m/s, and  with an assumed right atrial pressure of 3 mmHg, the estimated right ventricular systolic pressure is 14.4 mmHg. Left Atrium: Left atrial size was mildly dilated. Right Atrium: Right atrial size was normal in size. Pericardium: Trivial pericardial effusion is present. Mitral Valve: The mitral valve is normal in structure. No evidence of mitral valve regurgitation. No evidence of mitral valve stenosis. Tricuspid Valve: The tricuspid valve is normal in structure. Tricuspid valve regurgitation is mild. Aortic Valve: The aortic valve is tricuspid. Aortic valve regurgitation is not visualized. No aortic stenosis is present. Pulmonic Valve: The pulmonic  valve was normal in structure. Pulmonic valve regurgitation is not visualized. Aorta: The aortic root is normal in size and structure. Venous: The inferior vena cava is normal in size with greater than 50% respiratory variability, suggesting right atrial pressure of 3 mmHg. IAS/Shunts: No atrial level shunt detected by color flow Doppler. Additional Comments: A device lead is visualized in the right ventricle.  LEFT VENTRICLE PLAX 2D LVIDd:         3.90 cm   Diastology LVIDs:         2.40 cm   LV e' medial:    4.99 cm/s LV PW:         0.90 cm   LV E/e' medial:  13.6 LV IVS:        1.00 cm   LV e' lateral:   5.26 cm/s LVOT diam:     1.90 cm   LV E/e' lateral: 12.9 LV SV:         64 LV SV Index:   36 LVOT Area:     2.84 cm  RIGHT VENTRICLE RV S prime:     14.30 cm/s TAPSE (M-mode): 2.6 cm LEFT ATRIUM             Index        RIGHT ATRIUM           Index LA diam:        4.00 cm 2.25 cm/m   RA Area:     16.70 cm LA Vol (A2C):   57.8 ml 32.48 ml/m  RA Volume:   45.20 ml   25.40 ml/m LA Vol (A4C):   48.2 ml 27.09 ml/m LA Biplane Vol: 54.9 ml 30.85 ml/m  AORTIC VALVE LVOT Vmax:   95.30 cm/s LVOT Vmean:  71.400 cm/s LVOT VTI:    0.225 m  AORTA Ao Root diam: 2.40 cm Ao Asc diam:  3.10 cm MITRAL VALVE                TRICUSPID VALVE MV Area (PHT): 2.36 cm     TR Peak grad:   28.9 mmHg MV Decel Time: 322 msec     TR Mean grad:   16.0 mmHg MV E velocity: 68.00 cm/s   TR Vmax:        269.00 cm/s MV A velocity: 119.00 cm/s  TR Vmean:       188.0 cm/s MV E/A ratio:  0.57                             SHUNTS                             Systemic VTI:  0.22 m                             Systemic Diam: 1.90 cm Dalton McleanMD Electronically signed by Franki Monte Signature Date/Time: 07/20/2022/2:50:08 PM    Final    VAS [place] LOWER EXTREMITY VENOUS (DVT)  Result Date: 07/20/2022  Lower Venous DVT Study Patient Name:  KENNEDI LIZARDO  Date of Exam:   07/20/2022 Medical Rec #: [id number]      Accession #:    [id number] Date of Birth: [birth year]     Patient Gender: F Patient Age:   77 years Exam Location:  [facility] Procedure:  VAS [place] LOWER EXTREMITY VENOUS (DVT) Referring Phys: Gean Birchwood --------------------------------------------------------------------------------  Indications: Edema.  Performing Technologist: Maudry Mayhew RDMS, RVT, RDCS  Examination Guidelines: A complete evaluation includes B-mode imaging, spectral Doppler, color Doppler, and power Doppler as needed of all accessible portions of each vessel. Bilateral testing is considered an integral part of a complete examination. Limited examinations for reoccurring indications may be performed as noted. The reflux portion of the exam is performed with the patient in reverse Trendelenburg.  +---------+---------------+---------+-----------+----------+--------------+ RIGHT    CompressibilityPhasicitySpontaneityPropertiesThrombus Aging +---------+---------------+---------+-----------+----------+--------------+  CFV      Full           Yes      Yes                                 +---------+---------------+---------+-----------+----------+--------------+ SFJ      Full                                                        +---------+---------------+---------+-----------+----------+--------------+ FV Prox  Full                                                        +---------+---------------+---------+-----------+----------+--------------+ FV Mid   Full                                                        +---------+---------------+---------+-----------+----------+--------------+ FV DistalFull                                                        +---------+---------------+---------+-----------+----------+--------------+ PFV      Full                                                        +---------+---------------+---------+-----------+----------+--------------+ POP      Full           Yes      Yes                                 +---------+---------------+---------+-----------+----------+--------------+ PTV      Full                                                        +---------+---------------+---------+-----------+----------+--------------+ PERO     Full                                                        +---------+---------------+---------+-----------+----------+--------------+   +---------+---------------+---------+-----------+----------+--------------+  LEFT     CompressibilityPhasicitySpontaneityPropertiesThrombus Aging +---------+---------------+---------+-----------+----------+--------------+ CFV      Full           Yes      Yes                                 +---------+---------------+---------+-----------+----------+--------------+ SFJ      Full                                                        +---------+---------------+---------+-----------+----------+--------------+ FV Prox  Full                                                         +---------+---------------+---------+-----------+----------+--------------+ FV Mid   Full                                                        +---------+---------------+---------+-----------+----------+--------------+ FV DistalFull                                                        +---------+---------------+---------+-----------+----------+--------------+ PFV      Full                                                        +---------+---------------+---------+-----------+----------+--------------+ POP      Full           Yes      Yes                                 +---------+---------------+---------+-----------+----------+--------------+ PTV      Full                                                        +---------+---------------+---------+-----------+----------+--------------+ PERO     Full                                                        +---------+---------------+---------+-----------+----------+--------------+     Summary: BILATERAL: - No evidence of deep vein thrombosis seen in the lower extremities, bilaterally. -No evidence of popliteal cyst, bilaterally.   *See table(s) above for measurements and observations. Electronically signed by Deitra Mayo MD on 07/20/2022 at 1:00:35 PM.  Final    CT ANGIO HEAD NECK W WO CM  Result Date: 07/20/2022 CLINICAL DATA:  Stroke/TIA EXAM: CT ANGIOGRAPHY HEAD AND NECK TECHNIQUE: Multidetector CT imaging of the head and neck was performed using the standard protocol during bolus administration of intravenous contrast. Multiplanar CT image reconstructions and MIPs were obtained to evaluate the vascular anatomy. Carotid stenosis measurements (when applicable) are obtained utilizing NASCET criteria, using the distal internal carotid diameter as the denominator. RADIATION DOSE REDUCTION: This exam was performed according to the departmental dose-optimization program which includes  automated exposure control, adjustment of the mA and/or kV according to patient size and/or use of iterative reconstruction technique. CONTRAST:  94m OMNIPAQUE IOHEXOL 350 MG/ML SOLN COMPARISON:  02/13/2019 FINDINGS: CTA NECK FINDINGS SKELETON: There is no bony spinal canal stenosis. No lytic or blastic lesion. OTHER NECK: Heterogeneous 2.1 cm right thyroid nodule. UPPER CHEST: No pneumothorax or pleural effusion. No nodules or masses. AORTIC ARCH: There is no calcific atherosclerosis of the aortic arch. There is no aneurysm, dissection or hemodynamically significant stenosis of the visualized portion of the aorta. Conventional 3 vessel aortic branching pattern. The visualized proximal subclavian arteries are widely patent. RIGHT CAROTID SYSTEM: Normal without aneurysm, dissection or stenosis. LEFT CAROTID SYSTEM: Normal without aneurysm, dissection or stenosis. VERTEBRAL ARTERIES: Left dominant configuration. Both origins are clearly patent. There is no dissection, occlusion or flow-limiting stenosis to the skull base (V1-V3 segments). CTA HEAD FINDINGS POSTERIOR CIRCULATION: --Vertebral arteries: Normal V4 segments. --Inferior cerebellar arteries: Normal. --Basilar artery: Normal. --Superior cerebellar arteries: Normal. --Posterior cerebral arteries (PCA): Normal. ANTERIOR CIRCULATION: --Intracranial internal carotid arteries: Atherosclerotic calcification of the internal carotid arteries at the skull base without hemodynamically significant stenosis. --Anterior cerebral arteries (ACA): Normal. Both A1 segments are present. Patent anterior communicating artery (a-comm). --Middle cerebral arteries (MCA): Normal. VENOUS SINUSES: As permitted by contrast timing, patent. ANATOMIC VARIANTS: None Review of the MIP images confirms the above findings. IMPRESSION: 1. No emergent large vessel occlusion or hemodynamically significant stenosis of the head and neck. 2. Heterogeneous 2.1 cm right thyroid nodule. Recommend  nonemergent thyroid [place] Reference: J Am Coll Radiol. 2015 Feb;12(2): 143-50 Electronically Signed   By: KUlyses JarredM.D.   On: 07/20/2022 03:23   CT Head Wo Contrast  Result Date: 07/19/2022 CLINICAL DATA:  Headache, new or worsening. High blood pressure and high blood sugar. Intermittent headaches for a month. EXAM: CT HEAD WITHOUT CONTRAST TECHNIQUE: Contiguous axial images were obtained from the base of the skull through the vertex without intravenous contrast. RADIATION DOSE REDUCTION: This exam was performed according to the departmental dose-optimization program which includes automated exposure control, adjustment of the mA and/or kV according to patient size and/or use of iterative reconstruction technique. COMPARISON:  08/06/2020 FINDINGS: Brain: Focal areas of encephalomalacia in the left occipital region consistent with old infarct and unchanged since prior study. New areas of encephalomalacia demonstrated in the left thalamus, left caudate lobe, right cerebellum, and right centrum semiovale have developed since previous study. Appearance is likely to represent old lacunar infarcts. Acute lacunar changes are not excluded. Basal ganglia calcifications. No mass-effect or midline shift. No abnormal extra-axial fluid collections. Gray-white matter junctions are distinct. Basal cisterns are not effaced. No acute intracranial hemorrhage. Vascular: Intracranial arterial calcifications. Skull: Calvarium appears intact. Sinuses/Orbits: Paranasal sinuses and mastoid air cells are clear. Other: None. IMPRESSION: 1. 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 2. New foci of encephalomalacia consistent with lacunar infarcts demonstrated in the left thalamus,  left caudate lobe, right cerebellum, and right symptoms in the ovale. 3. No acute intracranial hemorrhage or mass effect. Electronically Signed   By: Lucienne Capers M.D.   On: 07/19/2022 17:10   DG Chest 2 View  Result Date:  07/19/2022 CLINICAL DATA:  Confusion EXAM: CHEST - 2 VIEW COMPARISON:  11/06/2007 FINDINGS: LEFT-sided pacemaker overlies normal cardiac silhouette. No effusion, infiltrate or pneumothorax. Degenerative osteophytosis of the spine. IMPRESSION: No acute cardiopulmonary process. Electronically Signed   By: Suzy Bouchard M.D.   On: 07/19/2022 16:58       Today   Subjective    Delene Morais today has no headache,no chest abdominal pain,no new weakness tingling or numbness, feels much better wants to go home today.     Objective   Blood pressure (!) 160/77, pulse 70, temperature 97.8 F (36.6 C), temperature source Oral, resp. rate 20, height _0  (1.626 m), weight 73.1 kg, SpO2 (!) 87 %.   Intake/Output Summary (Last 24 hours) at 07/21/2022 1040 Last data filed at 07/21/2022 0400 Gross per 24 hour  Intake 1652.92 ml  Output --  Net 1652.92 ml    Exam  Awake Alert, No new F.N deficits, has some left foot weakness which is subjective [place].AT,PERRAL Supple Neck,   Symmetrical Chest wall movement, Good air movement bilaterally, CTAB RRR,No Gallops,   +ve B.Sounds, Abd Soft, Non tender,  No Cyanosis, Clubbing or edema    Data Review   Recent Labs  Lab 07/19/22 1636 07/20/22 0529  WBC 12.3* 10.7*  HGB 13.7 13.4  HCT 40.7 39.7  PLT 177 178  MCV 92.1 91.3  MCH 31.0 30.8  MCHC 33.7 33.8  RDW 12.6 12.7  LYMPHSABS 2.0  --   MONOABS 0.7  --   EOSABS 0.0  --   BASOSABS 0.0  --     Recent Labs  Lab 07/19/22 1636 07/19/22 2330 07/20/22 0529  NA 136  --  136  K 3.7  --  4.0  CL 99  --  102  CO2 26  --  24  GLUCOSE 127*  --  147*  BUN 16  --  14  CREATININE 0.74  --  0.74  CALCIUM 9.8  --  9.2  AST  --   --  16  ALT  --   --  13  ALKPHOS  --   --  67  BILITOT  --   --  0.9  ALBUMIN  --   --  3.5  INR  --  1.0  --   HGBA1C  --   --  7.0*   Lab Results  Component Value Date   CHOL 223 (H) 07/20/2022   HDL 49 07/20/2022   LDLCALC 147 (H) 07/20/2022   LDLDIRECT 67  09/07/2019   TRIG 134 07/20/2022   CHOLHDL 4.6 07/20/2022     Total Time in preparing paper work, data evaluation and todays exam - 63 minutes  Lala Lund M.D on 07/21/2022 at 10:40 AM  [facility]

## 2022-07-21 NOTE — Progress Notes (Signed)
Discharge instructions reviewed with patient and significant other  utilizing teach back method no questions at this time

## 2022-07-21 NOTE — Inpatient Diabetes Management (Signed)
Inpatient Diabetes Program Recommendations  AACE/ADA: New Consensus Statement on Inpatient Glycemic Control (2015)  Target Ranges:  Prepandial:   less than 140 mg/dL      Peak postprandial:   less than 180 mg/dL (1-2 hours)      Critically ill patients:  140 - 180 mg/dL   Lab Results  Component Value Date   GLUCAP 167 (H) 07/21/2022   HGBA1C 7.0 (H) 07/20/2022    Review of Glycemic Control  Latest Reference Range & Units 07/19/22 16:34 07/20/22 08:09 07/20/22 11:53 07/21/22 08:13  Glucose-Capillary 70 - 99 mg/dL 106 (H) 132 (H) 171 (H) 167 (H)  (H): Data is abnormally high  Diabetes history: DM2 Outpatient Diabetes medications: Metformin 500 mg QD Current orders for Inpatient glycemic control: Novolog 0-9 units TID  Acute CVA  Referral received for DM and insulin teaching.  Current A1C is 7%.  Ordered Living Well with Diabetes booklet.    Will continue to follow while inpatient.  Thank you, Reche Dixon, MSN, [place] Diabetes Coordinator Inpatient Diabetes Program [phone] (team pager from 8a-5p)

## 2022-07-21 NOTE — Progress Notes (Addendum)
Occupational Therapy Treatment Patient Details Name: [name] MRN: [id number] DOB: 1951-05-03 Today's Date: 07/21/2022   History of present illness 71 yo female with onset of L foot weakness was admitted on 8/3, noted new stroke findings on MRI in L thalamus, L caudate lobe, R cerebellum and R ovale.  New encephalomalacia, hypertension, but not aware of any vision changes.  PMHx:  HTN, DM, HLD, GERD, colon polyps, OA neck, pacemaker, bradycardia, uterine CA, osteoporosis   OT comments  Completed gross vision testing at bedside with pt's tracking and convergence WFLs.  She was able to detect peripheral vision in all areas and read small print held at midline and slightly left of midline without missing any words.  With functional mobility, she moved slower than baseline without an assistive device in the hallway when attempted to locate items specified by therapist that were a certain color.  She missed items on both sides of the hallway with therapist having to stop her and have her scan.  Feel with gross testing her peripheral fields were intact however functionally she had more trouble on both sides in all areas (upper, eye level, and lower).  Will continue to follow for acute care OT needs and continue to assess vision in functional context.      Recommendations for follow up therapy are one component of a multi-disciplinary discharge planning process, led by the attending physician.  Recommendations may be updated based on patient status, additional functional criteria and insurance authorization.    Follow Up Recommendations  Home health OT    Assistance Recommended at Discharge Frequent or constant Supervision/Assistance  Patient can return home with the following  Assistance with cooking/housework;Assist for transportation;Direct supervision/assist for medications management   Equipment Recommendations  None recommended by OT       Precautions / Restrictions  Precautions Precautions: Fall Precaution Comments: L foot and ankle weak Restrictions Weight Bearing Restrictions: No       Mobility Bed Mobility                    Transfers Overall transfer level: Needs assistance Equipment used: None Transfers: Sit to/from Stand Sit to Stand: Supervision                 Balance Overall balance assessment: Mild deficits observed, not formally tested Sitting-balance support: No upper extremity supported, Feet supported Sitting balance-Leahy Scale: Normal     Standing balance support: No upper extremity supported Standing balance-Leahy Scale: Fair                             ADL either performed or assessed with clinical judgement   ADL Overall ADL's : Needs assistance/impaired Eating/Feeding: Independent;Sitting   Grooming: Wash/dry hands;Wash/dry face;Total assistance;Standing                   Toilet Transfer: Supervision/safety;Ambulation;Regular Toilet   Toileting- Water quality scientist and Hygiene: Supervision/safety;Sit to/from stand       Functional mobility during ADLs: Supervision/safety General ADL Comments: Pt able to ambulate slowly in the room with locate and pick up items off of the floor without use of an assistive device and supervison.  When ambulating in the hallway, she was inconsistent with locating objects on both the left and right sides of the hallway.  She missed the large crash cart on the right which was in the lower quadrant.  With vision testing at bedside she was able to  detect all areas of periphery, and correctly read small print held at midline and slightly left without missing any words.  Recommend continued vision testing at MD office with permietry testing machine in addition to working on vision functionally for scanning and locating items in the enviroment in therapy sessions.    Extremity/Trunk Assessment              Vision Baseline Vision/History: 1 Wears  glasses (for near and far vision) Ability to See in Adequate Light: 0 Adequate Patient Visual Report: No change from baseline Vision Assessment?: Yes Eye Alignment: Within Functional Limits Ocular Range of Motion: Within Functional Limits Alignment/Gaze Preference: Within Defined Limits Tracking/Visual Pursuits: Able to track stimulus in all quads without difficulty Saccades: Within functional limits Convergence: Within functional limits Visual Fields: Other (comment) Diplopia Assessment: Other (comment) (WFLs for gross testing, however functionally pt with inconsistencies locating objects on both side of the hallway when asked to find a certain color.) Additional Comments: Pt with one episode of bumping into the trashcan placed on the left side when returning to her room.   Perception Perception Perception: Within Functional Limits   Praxis Praxis Praxis: Intact    Cognition Arousal/Alertness: Awake/alert Behavior During Therapy: WFL for tasks assessed/performed Overall Cognitive Status: No family/caregiver present to determine baseline cognitive functioning                                 General Comments: Noted pt up in the room wiping off the floor in front of the sink when therapist entered without anyone in the room.  Re-directed her that it's probably not safe to get up without assistance.  She stated that the Dr. had told her this morning to get up and walk during the day.  Nursing also confirmed this.  Educate pt that this is true, but only with nursing or therapy at this time.              General Comments No LOB noted with functional mobility this session but moves at a slower rate of speed and pt continues to report her left foot is still a little weak.    Pertinent Vitals/ Pain       Pain Assessment Pain Assessment: No/denies pain         Frequency  Min 2X/week        Progress Toward Goals  OT Goals(current goals can now be found in the  care plan section)  Progress towards OT goals: Progressing toward goals  Acute Rehab OT Goals Patient Stated Goal: To go home as soon as possible OT Goal Formulation: With patient Time For Goal Achievement: 08/03/22  Plan Discharge plan remains appropriate       AM-PAC OT "6 Clicks" Daily Activity     Outcome Measure   Help from another person eating meals?: None Help from another person taking care of personal grooming?: None Help from another person toileting, which includes using toliet, bedpan, or urinal?: A Little Help from another person bathing (including washing, rinsing, drying)?: A Little Help from another person to put on and taking off regular upper body clothing?: None Help from another person to put on and taking off regular lower body clothing?: A Little 6 Click Score: 21    End of Session Equipment Utilized During Treatment: Gait belt  OT Visit Diagnosis: Unsteadiness on feet (R26.81);Other abnormalities of gait and mobility (R26.89);Muscle weakness (generalized) (M62.81);Other (comment) (visual  deficits)   Activity Tolerance Patient tolerated treatment well   Patient Left in chair;with call bell/phone within reach;with chair alarm set   Nurse Communication Mobility status        Time: 1000-1027 OT Time Calculation (min): 27 min  Charges: OT General Charges $OT Visit: 1 Visit OT Treatments $Self Care/Home Management : 8-22 mins $Therapeutic Activity: 8-22 mins  Nariah Morgano OTR/L 07/21/2022, 10:55 AM

## 2022-07-23 ENCOUNTER — Telehealth: Payer: Self-pay

## 2022-07-23 NOTE — Telephone Encounter (Signed)
Transition Care Management Follow-up Telephone Call Date of discharge and from where: 07/21/[facility] Acute [facility] How have you been since you were released from the hospital? Feeling fair Any questions or concerns? No  Items Reviewed: Did the pt receive and understand the discharge instructions provided? Yes  Medications obtained and verified? No  Other?  She needs appts for chest xray, thyroid ultrasound, CBC/CMP labs. Wants to make sure we are able to do Any new allergies since your discharge? No  Dietary orders reviewed? Yes Do you have support at home? Yes   Home Care and Equipment/Supplies: Were home health services ordered? yes If so, what is the name of the agency? unknown  Has the agency set up a time to come to the patient's home? yes Were any new equipment or medical supplies ordered?  Yes: walker with 4 wheels and seat What is the name of the medical supply agency? unknown Were you able to get the supplies/equipment? yes Do you have any questions related to the use of the equipment or supplies? No  Functional Questionnaire: (I = Independent and D = Dependent) ADLs: I  Bathing/Dressing- I  Meal Prep- I  Eating- I  Maintaining continence- I  Transferring/Ambulation- I  Managing Meds- I  Follow up appointments reviewed:  PCP Hospital f/u appt confirmed? Yes  Scheduled to see [facility] on 07/24/22 @ 11:50. [facility] f/u appt confirmed? Yes  Scheduled to see [facility] on 08/16/22. Are transportation arrangements needed? No  If their condition worsens, is the pt aware to call PCP or go to the Emergency Dept.? Yes Was the patient provided with contact information for the PCP's office or ED? Yes Was to pt encouraged to call back with questions or concerns? Yes

## 2022-07-24 ENCOUNTER — Encounter: Payer: Self-pay | Admitting: Family Medicine

## 2022-07-24 ENCOUNTER — Ambulatory Visit (INDEPENDENT_AMBULATORY_CARE_PROVIDER_SITE_OTHER): Payer: PPO | Admitting: Family Medicine

## 2022-07-24 VITALS — BP 130/76 | HR 71 | Temp 98.0°F | Ht 64.0 in | Wt 156.0 lb

## 2022-07-24 DIAGNOSIS — Z09 Encounter for follow-up examination after completed treatment for conditions other than malignant neoplasm: Secondary | ICD-10-CM | POA: Diagnosis not present

## 2022-07-24 DIAGNOSIS — I152 Hypertension secondary to endocrine disorders: Secondary | ICD-10-CM | POA: Diagnosis not present

## 2022-07-24 DIAGNOSIS — E785 Hyperlipidemia, unspecified: Secondary | ICD-10-CM | POA: Diagnosis not present

## 2022-07-24 DIAGNOSIS — Z8673 Personal history of transient ischemic attack (TIA), and cerebral infarction without residual deficits: Secondary | ICD-10-CM | POA: Diagnosis not present

## 2022-07-24 DIAGNOSIS — E041 Nontoxic single thyroid nodule: Secondary | ICD-10-CM | POA: Diagnosis not present

## 2022-07-24 DIAGNOSIS — E1159 Type 2 diabetes mellitus with other circulatory complications: Secondary | ICD-10-CM | POA: Diagnosis not present

## 2022-07-24 DIAGNOSIS — E1169 Type 2 diabetes mellitus with other specified complication: Secondary | ICD-10-CM

## 2022-07-24 MED ORDER — LINAGLIPTIN 5 MG PO TABS: 5.0000 mg | ORAL_TABLET | Freq: Every day | ORAL | 3 refills | Status: DC

## 2022-07-24 MED ORDER — CLOPIDOGREL BISULFATE 75 MG PO TABS
75.0000 mg | ORAL_TABLET | Freq: Every day | ORAL | 3 refills | Status: DC
Start: 2022-07-24 — End: 2023-08-08

## 2022-07-24 NOTE — Progress Notes (Signed)
Subjective: CC: Lacunar infarct PCP: Janora Norlander, DO [id number] [name] is a 71 y.o. female presenting to clinic today for:  1.  Recent CVA Patient was admitted to the hospital after she came in with left foot weakness.  She had imaging performed which showed lacunar infarcts in the left thalamus, left caudate lobe, right cerebellum.  MRI could not be performed secondary to history of permanent pacemaker.  She was advised to continue 3 weeks of dual antiplatelet therapy and then she can continue Plavix only.  She was restarted on Crestor, which had been previously discontinued secondary to intolerance.  Vascepa was discontinued.  Her lipid panel in the hospital showed LDL having gone from 70 up to 147.  She was started on Tradjenta and advised to restart metformin in a few days.  A1c in the hospital was 7.0.  She has follow-up with neurology on 08/16/2022.   She is accompanied today's visit by her son.  She admits that she had discontinued the Vascepa for several weeks prior to her evaluation in the ER.  She apparently had been taking half of a tablet of her husbands Crestor up until that point.  She is tolerating the Crestor without difficulty but again admits that she has been splitting the dose in half to only 10 mg despite having been prescribed 20 mg at discharge.  She is compliant with Tradjenta, metformin as prescribed.  She has been taking the Plavix and aspirin as directed but did not know that she was post to discontinue aspirin after 3 weeks of treatment.  She notes that she still has some abnormal sensation in that left foot but it seems to be getting stronger each day.  Her hospitalist recommended repeat CMP, CBC and chest x-ray at discharge despite having had normal chest x-ray in the hospital.  Thyroid ultrasound was also recommended to follow-up on the nodule that was previously biopsied and determined to be benign.  Nodules noted to be 2.1 cm on the right side.  Ultrasound noted  this lesion to be 5.4 x 1.8 x 2.1 cm previously in 2020.   ROS: Per HPI  Allergies  Allergen Reactions   Glipizide Itching   Pravachol [Pravastatin Sodium] Other (See Comments)    Stomach pain and constipation    Past Medical History:  Diagnosis Date   Anxiety    Arthritis    of the neck   Colon polyps    Diabetes mellitus without complication (HCC)    Esophageal stricture    Essential hypertension 01/05/2020   GERD (gastroesophageal reflux disease)    Hiatal Hernia   History of uterine cancer    Hyperlipidemia    Hypertension    Osteoporosis    Pacemaker    Symptomatic bradycardia     Current Outpatient Medications:    aspirin EC 81 MG tablet, Take 1 tablet (81 mg total) by mouth daily., Disp: 20 tablet, Rfl: 0   Blood Glucose Monitoring Suppl (ONE TOUCH ULTRA 2) w/Device KIT, 1 Device by Does not apply route daily., Disp: 1 each, Rfl: 0   BYSTOLIC 5 MG tablet, TAKE (1) TABLET TWICE A DAY., Disp: 180 tablet, Rfl: 3   calcium citrate-vitamin D (CITRACAL+D) 315-200 MG-UNIT per tablet, Take 1 tablet by mouth daily., Disp: , Rfl:    carboxymethylcellulose (REFRESH PLUS) 0.5 % SOLN, Place 1 drop into both eyes daily as needed (dry eyes)., Disp: , Rfl:    clobetasol ointment (TEMOVATE) 9.75 %, Apply 1 application topically at  bedtime for 42 days, THEN 1 application 2 (two) times a week., Disp: 60 g, Rfl: PRN   clopidogrel (PLAVIX) 75 MG tablet, Take 1 tablet (75 mg total) by mouth daily., Disp: 30 tablet, Rfl: 0   COLLAGEN PO, Take 0.5 Scoops by mouth daily., Disp: , Rfl:    diclofenac Sodium (VOLTAREN) 1 % GEL, Apply 4 g topically 4 (four) times daily. (Patient taking differently: Apply 4 g topically 2 (two) times daily as needed (pain).), Disp: 400 g, Rfl: PRN   glucose blood test strip, Use to check BG up to qd, Disp: 100 each, Rfl: 2   linagliptin (TRADJENTA) 5 MG TABS tablet, Take 1 tablet (5 mg total) by mouth daily., Disp: 30 tablet, Rfl: 0   metFORMIN (GLUCOPHAGE) 500 MG  tablet, Take 1 tablet (500 mg total) by mouth daily with supper., Disp: 90 tablet, Rfl: 0   ONETOUCH DELICA LANCETS FINE MISC, Use to check BG up to qd, Disp: 100 each, Rfl: 2   pantoprazole (PROTONIX) 40 MG tablet, Take 1 tablet (40 mg total) by mouth daily., Disp: 90 tablet, Rfl: 3   rosuvastatin (CRESTOR) 20 MG tablet, Take 1 tablet (20 mg total) by mouth daily., Disp: 30 tablet, Rfl: 0   sertraline (ZOLOFT) 50 MG tablet, Take 1 tablet (50 mg total) by mouth daily. (Patient taking differently: Take 25 mg by mouth at bedtime.), Disp: 90 tablet, Rfl: 3 Social History   Socioeconomic History   Marital status: Significant Other    Spouse name: Not on file   Number of children: 1   Years of education: GED   Highest education level: GED or equivalent  Occupational History   Occupation: retired    Comment: Engineer, manufacturing systems  Tobacco Use   Smoking status: Never   Smokeless tobacco: Never  Scientific laboratory technician Use: Never used  Substance and Sexual Activity   Alcohol use: No    Comment: may have a social drink rarely   Drug use: No   Sexual activity: Not Currently  Other Topics Concern   Not on file  Social History Narrative   Not on file   Social Determinants of Health   Financial Resource Strain: Low Risk  (11/13/2021)   Overall Financial Resource Strain (CARDIA)    Difficulty of Paying Living Expenses: Not hard at all  Food Insecurity: No Food Insecurity (11/13/2021)   Hunger Vital Sign    Worried About Running Out of Food in the Last Year: Never true    S[place] in the Last Year: Never true  Transportation Needs: No Transportation Needs (11/13/2021)   PRAPARE - Hydrologist (Medical): No    Lack of Transportation (Non-Medical): No  Physical Activity: Sufficiently Active (11/13/2021)   Exercise Vital Sign    Days of Exercise per Week: 5 days    Minutes of Exercise per Session: 30 min  Stress: No Stress Concern Present (11/13/2021)   [place]    Feeling of Stress : Only a little  Social Connections: Socially Integrated (11/13/2021)   Social Connection and Isolation Panel [NHANES]    Frequency of Communication with Friends and Family: More than three times a week    Frequency of Social Gatherings with Friends and Family: More than three times a week    Attends Religious Services: 1 to 4 times per year    Active Member of [employer] or Organizations: Yes  Attends Archivist Meetings: 1 to 4 times per year    Marital Status: Living with partner  Intimate Partner Violence: Not At Risk (11/13/2021)   Humiliation, Afraid, Rape, and Kick questionnaire    Fear of Current or Ex-Partner: No    Emotionally Abused: No    Physically Abused: No    Sexually Abused: No   Family History  Problem Relation Age of Onset   Heart disease Mother    Heart attack Mother    Heart disease Father    Heart attack Father    Cancer Sister        breast   Breast cancer Sister    Alcohol abuse Brother    COPD Sister    Healthy Daughter     Objective: Office vital signs reviewed. BP 130/76   Pulse 71   Temp 98 F (36.7 C)   Ht '5\' 4"'  (1.626 m)   Wt 156 lb (70.8 kg)   SpO2 96%   BMI 26.78 kg/m   Physical Examination:  General: Awake, alert, nontoxic-appearing female, No acute distress HEENT: No exophthalmos.  No goiter Neuro: Some difficulty with quick memory but ultimately able to formulate thoughts and sentences.  No appreciable dragging of the foot appreciated with ambulation  Assessment/ Plan: 71 y.o. female   History of lacunar cerebrovascular accident (CVA) - Plan: CBC, [facility] discharge follow-up  Controlled type 2 diabetes mellitus with other specified complication, without long-term current use of insulin ([name]) - Plan: CBC, CMP14+EGFR  Hyperlipidemia associated with type 2 diabetes mellitus ([place]) w/ goal LDL <70 - Plan: CBC,  CMP14+EGFR  Hypertension associated with diabetes ([place]) - Plan: CBC, CMP14+EGFR  Thyroid nodule - Plan: US THYROID, CBC, CMP14+EGFR  I reviewed her hospital discharge summary and recommendations.  CMP and CBC were collected but I reviewed her x-ray and there was no acute abnormalities to support repeat chest x-ray.  She is not having any pulmonary symptoms either  Her sugar technically is under control with A1c of 7.0 but now that she is have a CVA we discussed A1c goal of 6.5.  We discussed LDL of 70 or less recommended as well, which she had previously achieved but it sounds like due to some self changes of medications perhaps her LDL skyrocketed after that as her hospital LDL was well above 140.  So far it sounds like she is tolerating the Crestor without difficulty but I advised her to go ahead and increase back up to 20 mg because she has had a CVA.  A moderate to high intensity statin is recommended.  Again reiterated need for discontinuation of aspirin after 3 weeks of dual therapy.  She denoted this today.  Blood pressure is controlled.  Ultrasound ordered for the thyroid follow-up but according to the size on the CT it seems to be consistent with previous size noted on ultrasound in 2020.  This was previously biopsied as well.  Will follow-up for completion but unsure if any further interventions will be needed  She will follow-up in 3 months for repeat fasting lipid, A1c  No orders of the defined types were placed in this encounter.  No orders of the defined types were placed in this encounter.   Today's visit is for Transitional Care Management.  The patient was discharged from [facility] on 07/21/2022 with a primary diagnosis of Acute CVA.   Contact with the patient and/or caregiver, by a clinical staff member, was made on 07/23/2022 and was documented  as a telephone encounter within the EMR.  Through chart review and discussion with the patient I have determined that management of  their condition is of moderate complexity.    Janora Norlander, DO [place] [phone]

## 2022-07-25 LAB — CMP14+EGFR
ALT: 21 IU/L (ref 0–32)
AST: 21 IU/L (ref 0–40)
Albumin/Globulin Ratio: 1.8 (ref 1.2–2.2)
Albumin: 4.5 g/dL (ref 3.9–4.9)
Alkaline Phosphatase: 84 IU/L (ref 44–121)
BUN/Creatinine Ratio: 25 (ref 12–28)
BUN: 21 mg/dL (ref 8–27)
Bilirubin Total: 0.4 mg/dL (ref 0.0–1.2)
CO2: 21 mmol/L (ref 20–29)
Calcium: 9.7 mg/dL (ref 8.7–10.3)
Chloride: 102 mmol/L (ref 96–106)
Creatinine, Ser: 0.83 mg/dL (ref 0.57–1.00)
Globulin, Total: 2.5 g/dL (ref 1.5–4.5)
Glucose: 109 mg/dL — ABNORMAL HIGH (ref 70–99)
Potassium: 4.6 mmol/L (ref 3.5–5.2)
Sodium: 141 mmol/L (ref 134–144)
Total Protein: 7 g/dL (ref 6.0–8.5)
eGFR: 76 mL/min/{1.73_m2} (ref >59)

## 2022-07-25 LAB — CBC
Hematocrit: 42.4 % (ref 34.0–46.6)
Hemoglobin: 13.8 g/dL (ref 11.1–15.9)
MCH: 30.3 pg (ref 26.6–33.0)
MCHC: 32.5 g/dL (ref 31.5–35.7)
MCV: 93 fL (ref 79–97)
Platelets: 222 x10E3/uL (ref 150–450)
RBC: 4.56 x10E6/uL (ref 3.77–5.28)
RDW: 12.3 % (ref 11.7–15.4)
WBC: 8.9 x10E3/uL (ref 3.4–10.8)

## 2022-08-02 ENCOUNTER — Other Ambulatory Visit (HOSPITAL_COMMUNITY): Payer: PPO

## 2022-08-06 ENCOUNTER — Encounter: Payer: Self-pay | Admitting: Nurse Practitioner

## 2022-08-06 ENCOUNTER — Ambulatory Visit (INDEPENDENT_AMBULATORY_CARE_PROVIDER_SITE_OTHER): Payer: PPO | Admitting: Nurse Practitioner

## 2022-08-06 ENCOUNTER — Telehealth: Payer: Self-pay

## 2022-08-06 VITALS — BP 127/82 | HR 68 | Temp 98.5°F | Ht 64.0 in | Wt 155.0 lb

## 2022-08-06 DIAGNOSIS — N898 Other specified noninflammatory disorders of vagina: Secondary | ICD-10-CM | POA: Diagnosis not present

## 2022-08-06 LAB — WET PREP FOR TRICH, YEAST, CLUE
Clue Cell Exam: NEGATIVE
Trichomonas Exam: NEGATIVE
Yeast Exam: POSITIVE — AB

## 2022-08-06 MED ORDER — FLUCONAZOLE 150 MG PO TABS: 150.0000 mg | ORAL_TABLET | Freq: Once | ORAL | 0 refills | Status: AC

## 2022-08-06 NOTE — Addendum Note (Signed)
Addended by: Ivy Lynn on: 08/06/2022 04:01 PM   Modules accepted: Orders

## 2022-08-06 NOTE — Patient Instructions (Signed)

## 2022-08-06 NOTE — Telephone Encounter (Signed)
error 

## 2022-08-06 NOTE — Progress Notes (Signed)
Acute Office Visit  Subjective:     Patient ID: [name], female    DOB: 05-05-1951, 71 y.o.   MRN: [id number]  Chief Complaint  Patient presents with   Vaginitis   Vaginal Itching    Last couple of days    Vaginal Discharge    Vaginal Itching The patient's primary symptoms include vaginal discharge. The patient's pertinent negatives include no genital itching, genital rash or vaginal bleeding. This is a new problem. The current episode started in the past 7 days. The problem occurs constantly. The problem has been gradually worsening. The patient is experiencing no pain. Pertinent negatives include no abdominal pain, anorexia, back pain, chills, fever, flank pain, nausea or rash. The vaginal discharge was copious and white. There has been no bleeding. Nothing aggravates the symptoms. Treatments tried: Over-the-counter Monistat and Vagisil. The treatment provided no relief. She is not sexually active. She is postmenopausal.  Vaginal Discharge The patient's primary symptoms include vaginal discharge. The patient's pertinent negatives include no genital itching, genital rash or vaginal bleeding. This is a new problem. The current episode started in the past 7 days. The problem occurs constantly. The problem has been unchanged. The patient is experiencing no pain. Pertinent negatives include no abdominal pain, anorexia, back pain, chills, fever, flank pain, nausea or rash. The vaginal discharge was copious and white. There has been no bleeding. She has not been passing clots. She has not been passing tissue. Nothing aggravates the symptoms. She is not sexually active. She is postmenopausal.     Review of Systems  Constitutional:  Negative for chills and fever.  Eyes: Negative.   Cardiovascular: Negative.   Gastrointestinal:  Negative for abdominal pain, anorexia and nausea.  Genitourinary:  Positive for vaginal discharge. Negative for flank pain.  Musculoskeletal:  Negative for back  pain.  Skin:  Negative for rash.  All other systems reviewed and are negative.       Objective:    BP 127/82   Pulse 68   Temp 98.5 F (36.9 C)   Ht '5\' 4"'$  (1.626 m)   Wt 155 lb (70.3 kg)   SpO2 94%   BMI 26.61 kg/m  BP Readings from Last 3 Encounters:  08/06/22 127/82  07/24/22 130/76  07/21/22 (!) 162/70   Wt Readings from Last 3 Encounters:  08/06/22 155 lb (70.3 kg)  07/24/22 156 lb (70.8 kg)  07/20/22 161 lb 2.5 oz (73.1 kg)      Physical Exam Vitals and nursing note reviewed.  Constitutional:      Appearance: Normal appearance.  HENT:     Head: Normocephalic.     Right Ear: External ear normal.     Left Ear: External ear normal.     Nose: Nose normal.     Mouth/Throat:     Mouth: Mucous membranes are moist.     Pharynx: Oropharynx is clear.  Eyes:     Conjunctiva/sclera: Conjunctivae normal.  Cardiovascular:     Rate and Rhythm: Normal rate and regular rhythm.     Pulses: Normal pulses.  Pulmonary:     Effort: Pulmonary effort is normal.     Breath sounds: Normal breath sounds.  Abdominal:     General: Bowel sounds are normal.  Skin:    General: Skin is warm.     Findings: No erythema.  Neurological:     General: No focal deficit present.     Mental Status: She is alert and oriented to person, place, and  time.  Psychiatric:        Mood and Affect: Mood normal.        Behavior: Behavior normal.     No results found for any visits on 08/06/22.      Assessment & Plan:  Patient presents with vaginal discharge and itching in the last 7 days.  Patient reports using Vagisil daily and Monistat with no therapeutic effect. Completed wet prep results pending. Will treat patient based on lab results.  Education provided to patient printed handouts given follow-up with unresolved symptoms. Problem List Items Addressed This Visit   None Visit Diagnoses     Vaginal discharge    -  Primary   Relevant Orders   WET PREP FOR [place], YEAST, CLUE        No orders of the defined types were placed in this encounter.   Return if symptoms worsen or fail to improve.  Ivy Lynn, NP

## 2022-08-09 ENCOUNTER — Ambulatory Visit (HOSPITAL_COMMUNITY)
Admission: RE | Admit: 2022-08-09 | Discharge: 2022-08-09 | Disposition: A | Discharge status: Home / Self Care | Payer: PPO | Source: Ambulatory Visit | Attending: Family Medicine | Admitting: Family Medicine

## 2022-08-09 DIAGNOSIS — E041 Nontoxic single thyroid nodule: Secondary | ICD-10-CM | POA: Insufficient documentation

## 2022-08-14 NOTE — Progress Notes (Unsigned)
[facility] [address] [place]. [place] [address] [phone]       HOSPITAL FOLLOW UP NOTE  [name] Date of Birth:  [birth year] Medical Record Number:  [id number]   Reason for Referral:  hospital stroke follow up    SUBJECTIVE:   CHIEF COMPLAINT:  No chief complaint on file.   HPI:   [name] is a 71 y.o. female with history of GERD, DM2, HLD and HTN who presented on 07/19/2022 with acute onset weakness and heaviness of her left leg which began on Monday 7/31.  Personally reviewed hospitalization pertinent progress notes, lab work and imaging.  Evaluated by Dr. Leonie Man for likely right-sided small stroke not seen on CT (unable to obtain MRI due to pacer) etiology likely secondary to small vessel disease in setting of uncontrolled risk factors although unsure if presenting symptom of left leg pain and weakness more mechanical issue rather than right brain infarct.  CTA head/neck negative LVO or hemodynamically significant stenosis. CT head showed multiple old prior strokes as noted below. EF ***.  LDL 147.  A1c 7.0.  On aspirin PTA and recommended DAPT for 3 weeks then Plavix alone as well as added Crestor 20 mg daily.  Therapies recommended home health PT/OT for residual subjective left foot weakness.        PERTINENT IMAGING  Per hospitalization 07/19/2022 CT head No acute abnormality. Old lacunar infarcts in left thalamus, left caudate lobe, right cerebellum and right centrum ovale.  Old infarct in left occipital region. CTA head & neck no LVO or hemodynamically significant stenosis, right thyroid nodule Follow up CT pending 2D Echo pending BLE [place] pending LDL 147 HgbA1c 7.0    ROS:   14 system review of systems performed and negative with exception of ***  PMH:  Past Medical History:  Diagnosis Date   Acute CVA (cerebrovascular accident) ([place]) 07/20/2022   Anxiety    Arthritis    of the neck   Colon polyps    Diabetes  mellitus without complication ([place])    Esophageal stricture    Essential hypertension 01/05/2020   GERD (gastroesophageal reflux disease)    Hiatal Hernia   History of uterine cancer    Hyperlipidemia    Hypertension    Occipital stroke ([place]) 02/13/2019   Osteoporosis    Pacemaker    Symptomatic bradycardia    Tingling in extremities 07/20/2022    PSH:  Past Surgical History:  Procedure Laterality Date   APPENDECTOMY     CHOLECYSTECTOMY     LAPAROSCOPIC TOTAL HYSTERECTOMY     LYMPH NODE DISSECTION     bilateral pelvic and right-sided periaortic   PACEMAKER GENERATOR CHANGE N/A 05/21/2014   Procedure: PACEMAKER GENERATOR CHANGE;  Surgeon: Evans Lance, MD;  Location: [facility] CATH LAB;  Service: Cardiovascular;  Laterality: N/A;   status post pacemaker     metronic [id number]   TRANSTHORACIC ECHOCARDIOGRAM  08/30/04   TUBAL LIGATION      Social History:  Social History   Socioeconomic History   Marital status: Significant Other    Spouse name: Not on file   Number of children: 1   Years of education: GED   Highest education level: GED or equivalent  Occupational History   Occupation: retired    Comment: Engineer, manufacturing systems  Tobacco Use   Smoking status: Never   Smokeless tobacco: Never  Vaping Use   Vaping Use: Never used  Substance and Sexual Activity   Alcohol  use: No    Comment: may have a social drink rarely   Drug use: No   Sexual activity: Not Currently  Other Topics Concern   Not on file  Social History Narrative   Not on file   Social Determinants of Health   Financial Resource Strain: Low Risk  (11/13/2021)   Overall Financial Resource Strain (CARDIA)    Difficulty of Paying Living Expenses: Not hard at all  Food Insecurity: No Food Insecurity (11/13/2021)   Hunger Vital Sign    Worried About Running Out of Food in the Last Year: Never true    Ran Out of Food in the Last Year: Never true  Transportation Needs: No Transportation Needs (11/13/2021)    PRAPARE - Hydrologist (Medical): No    Lack of Transportation (Non-Medical): No  Physical Activity: Sufficiently Active (11/13/2021)   Exercise Vital Sign    Days of Exercise per Week: 5 days    Minutes of Exercise per Session: 30 min  Stress: No Stress Concern Present (11/13/2021)   [place]    Feeling of Stress : Only a little  Social Connections: Socially Integrated (11/13/2021)   Social Connection and Isolation Panel [NHANES]    Frequency of Communication with Friends and Family: More than three times a week    Frequency of Social Gatherings with Friends and Family: More than three times a week    Attends Religious Services: 1 to 4 times per year    Active Member of [employer] or Organizations: Yes    Attends Archivist Meetings: 1 to 4 times per year    Marital Status: Living with partner  Intimate Partner Violence: Not At Risk (11/13/2021)   Humiliation, Afraid, Rape, and Kick questionnaire    Fear of Current or Ex-Partner: No    Emotionally Abused: No    Physically Abused: No    Sexually Abused: No    Family History:  Family History  Problem Relation Age of Onset   Heart disease Mother    Heart attack Mother    Heart disease Father    Heart attack Father    Cancer Sister        breast   Breast cancer Sister    Alcohol abuse Brother    COPD Sister    Healthy Daughter     Medications:   Current Outpatient Medications on File Prior to Visit  Medication Sig Dispense Refill   aspirin EC 81 MG tablet Take 1 tablet (81 mg total) by mouth daily. 20 tablet 0   Blood Glucose Monitoring Suppl (ONE TOUCH ULTRA 2) w/Device KIT 1 Device by Does not apply route daily. 1 each 0   BYSTOLIC 5 MG tablet TAKE (1) TABLET TWICE A DAY. 180 tablet 3   calcium citrate-vitamin D (CITRACAL+D) 315-200 MG-UNIT per tablet Take 1 tablet by mouth daily.     carboxymethylcellulose (REFRESH  PLUS) 0.5 % SOLN Place 1 drop into both eyes daily as needed (dry eyes).     clobetasol ointment (TEMOVATE) 1.61 % Apply 1 application topically at bedtime for 42 days, THEN 1 application 2 (two) times a week. 60 g PRN   clopidogrel (PLAVIX) 75 MG tablet Take 1 tablet (75 mg total) by mouth daily. 90 tablet 3   COLLAGEN PO Take 0.5 Scoops by mouth daily.     diclofenac Sodium (VOLTAREN) 1 % GEL Apply 4 g topically 4 (four) times daily. (Patient  taking differently: Apply 4 g topically 2 (two) times daily as needed (pain).) 400 g PRN   glucose blood test strip Use to check BG up to qd 100 each 2   linagliptin (TRADJENTA) 5 MG TABS tablet Take 1 tablet (5 mg total) by mouth daily. For diabetes 90 tablet 3   metFORMIN (GLUCOPHAGE) 500 MG tablet Take 1 tablet (500 mg total) by mouth daily with supper. 90 tablet 0   ONETOUCH DELICA LANCETS FINE MISC Use to check BG up to qd 100 each 2   pantoprazole (PROTONIX) 40 MG tablet Take 1 tablet (40 mg total) by mouth daily. 90 tablet 3   rosuvastatin (CRESTOR) 20 MG tablet Take 1 tablet (20 mg total) by mouth daily. 30 tablet 0   sertraline (ZOLOFT) 50 MG tablet Take 1 tablet (50 mg total) by mouth daily. (Patient taking differently: Take 25 mg by mouth at bedtime.) 90 tablet 3   No current facility-administered medications on file prior to visit.    Allergies:   Allergies  Allergen Reactions   Glipizide Itching   Pravachol [Pravastatin Sodium] Other (See Comments)    Stomach pain and constipation       OBJECTIVE:  Physical Exam  There were no vitals filed for this visit. There is no height or weight on file to calculate BMI. No results found.     08/06/2022    3:02 PM  Depression screen PHQ 2/9  Decreased Interest 0  Down, Depressed, Hopeless 0  PHQ - 2 Score 0  Altered sleeping 2  Tired, decreased energy 2  Change in appetite 0  Feeling bad or failure about yourself  0  Trouble concentrating 0  Moving slowly or fidgety/restless 0   PHQ-9 Score 4  Difficult doing work/chores Not difficult at all     General: well developed, well nourished, seated, in no evident distress Head: head normocephalic and atraumatic.   Neck: supple with no carotid or supraclavicular bruits Cardiovascular: regular rate and rhythm, no murmurs Musculoskeletal: no deformity Skin:  no rash/petichiae Vascular:  Normal pulses all extremities   Neurologic Exam Mental Status: Awake and fully alert. Oriented to place and time. Recent and remote memory intact. Attention span, concentration and fund of knowledge appropriate. Mood and affect appropriate.  Cranial Nerves: Fundoscopic exam reveals sharp disc margins. Pupils equal, briskly reactive to light. Extraocular movements full without nystagmus. Visual fields full to confrontation. Hearing intact. Facial sensation intact. Face, tongue, palate moves normally and symmetrically.  Motor: Normal bulk and tone. Normal strength in all tested extremity muscles Sensory.: intact to touch , pinprick , position and vibratory sensation.  Coordination: Rapid alternating movements normal in all extremities. Finger-to-nose and heel-to-shin performed accurately bilaterally. Gait and Station: Arises from chair without difficulty. Stance is normal. Gait demonstrates normal stride length and balance with ***. Tandem walk and heel toe ***.  Reflexes: 1+ and symmetric. Toes downgoing.     NIHSS  *** Modified [place]  ***      ASSESSMENT: [name] is a 49 y.o. year old female with suspected right-sided small stroke not seen on CT (unable to obtain MRI due to pacer) likely secondary to small vessel disease in setting of uncontrolled risk factors. Vascular risk factors include HTN, HLD, DM, advanced age and multiple prior strokes.      PLAN:  Suspected right brain stroke:  Residual deficit: ***.  Continue Plavix and Crestor 20 mg daily for secondary stroke prevention.   Discussed secondary stroke  prevention  measures and importance of close PCP follow up for aggressive stroke risk factor management including BP goal<130/90, HLD with LDL goal<70 and DM with A1c.<7 .  Stroke labs 07/2022: LDL 147, A1c 7.0 I have gone over the pathophysiology of stroke, warning signs and symptoms, risk factors and their management in some detail with instructions to go to the closest emergency room for symptoms of concern.     Follow up in *** or call earlier if needed   CC:  [facility] provider: Dr. Leonie Man PCP: Janora Norlander, DO    I spent *** minutes of face-to-face and non-face-to-face time with patient.  This included previsit chart review including review of recent hospitalization, lab review, study review, order entry, electronic health record documentation, patient education regarding recent stroke including etiology, secondary stroke prevention measures and importance of managing stroke risk factors, residual deficits and typical recovery time and answered all other questions to patient satisfaction   Frann Rider, AGNP-BC  [facility] [address] [place], [place] [address]  Phone [phone] Fax [phone] Note: This document was prepared with digital dictation and possible smart phrase technology. Any transcriptional errors that result from this process are unintentional.

## 2022-08-16 ENCOUNTER — Ambulatory Visit: Payer: PPO | Admitting: Adult Health

## 2022-08-16 ENCOUNTER — Encounter: Payer: Self-pay | Admitting: Adult Health

## 2022-08-16 VITALS — BP 142/79 | HR 65 | Ht 64.0 in | Wt 154.0 lb

## 2022-08-16 DIAGNOSIS — I69398 Other sequelae of cerebral infarction: Secondary | ICD-10-CM

## 2022-08-16 DIAGNOSIS — I639 Cerebral infarction, unspecified: Secondary | ICD-10-CM

## 2022-08-16 DIAGNOSIS — Z09 Encounter for follow-up examination after completed treatment for conditions other than malignant neoplasm: Secondary | ICD-10-CM

## 2022-08-16 DIAGNOSIS — R269 Unspecified abnormalities of gait and mobility: Secondary | ICD-10-CM

## 2022-08-16 DIAGNOSIS — Z8673 Personal history of transient ischemic attack (TIA), and cerebral infarction without residual deficits: Secondary | ICD-10-CM

## 2022-08-16 DIAGNOSIS — R29898 Other symptoms and signs involving the musculoskeletal system: Secondary | ICD-10-CM | POA: Diagnosis not present

## 2022-08-16 NOTE — Patient Instructions (Addendum)
Referral placed to [facility] outpatient rehab - you will be called to schedule   Continue clopidogrel 75 mg daily   for secondary stroke prevention Please follow up with your primary care doctor (PCP) to discuss initiating Repatha   Continue to follow up with PCP regarding blood pressure, cholesterol and diabetes management  Maintain strict control of hypertension with blood pressure goal below 130/90, diabetes with hemoglobin A1c goal below 7.0 % and cholesterol with LDL cholesterol (bad cholesterol) goal below 70 mg/dL.   Signs of a Stroke? Follow the BEFAST method:  Balance Watch for a sudden loss of balance, trouble with coordination or vertigo Eyes Is there a sudden loss of vision in one or both eyes? Or double vision?  Face: Ask the person to smile. Does one side of the face droop or is it numb?  Arms: Ask the person to raise both arms. Does one arm drift downward? Is there weakness or numbness of a leg? Speech: Ask the person to repeat a simple phrase. Does the speech sound slurred/strange? Is the person confused ? Time: If you observe any of these signs, call 911.     Followup in the future with me in 4 months or call earlier if needed       Thank you for coming to see [place] at [facility]. I hope we have been able to provide you high quality care today.  You may receive a patient satisfaction survey over the next few weeks. We would appreciate your feedback and comments so that we may continue to improve ourselves and the health of our patients.     Stroke Prevention Some medical conditions and lifestyle choices can lead to a higher risk for a stroke. You can help to prevent a stroke by eating healthy foods and exercising. It also helps to not smoke and to manage any health problems you may have. How can this condition affect me? A stroke is an emergency. It should be treated right away. A stroke can lead to brain damage or threaten your life. There is a  better chance of surviving and getting better after a stroke if you get medical help right away. What can increase my risk? The following medical conditions may increase your risk of a stroke: Diseases of the heart and blood vessels (cardiovascular disease). High blood pressure (hypertension). Diabetes. High cholesterol. Sickle cell disease. Problems with blood clotting. Being very overweight. Sleeping problems (obstructivesleep apnea). Other risk factors include: Being older than age 48. A history of blood clots, stroke, or mini-stroke (TIA). Race, ethnic background, or a family history of stroke. Smoking or using tobacco products. Taking birth control pills, especially if you smoke. Heavy alcohol and drug use. Not being active. What actions can I take to prevent this? Manage your health conditions High cholesterol. Eat a healthy diet. If this is not enough to manage your cholesterol, you may need to take medicines. Take medicines as told by your doctor. High blood pressure. Try to keep your blood pressure below 130/80. If your blood pressure cannot be managed through a healthy diet and regular exercise, you may need to take medicines. Take medicines as told by your doctor. Ask your doctor if you should check your blood pressure at home. Have your blood pressure checked every year. Diabetes. Eat a healthy diet and get regular exercise. If your blood sugar (glucose) cannot be managed through diet and exercise, you may need to take medicines. Take medicines as told by your doctor. Talk  to your doctor about getting checked for sleeping problems. Signs of a problem can include: Snoring a lot. Feeling very tired. Make sure that you manage any other conditions you have. Nutrition  Follow instructions from your doctor about what to eat or drink. You may be told to: Eat and drink fewer calories each day. Limit how much salt (sodium) you use to 1,500 milligrams (mg) each day. Use  only healthy fats for cooking, such as olive oil, canola oil, and sunflower oil. Eat healthy foods. To do this: Choose foods that are high in fiber. These include whole grains, and fresh fruits and vegetables. Eat at least 5 servings of fruits and vegetables a day. Try to fill one-half of your plate with fruits and vegetables at each meal. Choose low-fat (lean) proteins. These include low-fat cuts of meat, chicken without skin, fish, tofu, beans, and nuts. Eat low-fat dairy products. Avoid foods that: Are high in salt. Have saturated fat. Have trans fat. Have cholesterol. Are processed or pre-made. Count how many carbohydrates you eat and drink each day. Lifestyle If you drink alcohol: Limit how much you have to: 0-1 drink a day for women who are not pregnant. 0-2 drinks a day for men. Know how much alcohol is in your drink. In the [place] one drink equals one 12 oz bottle of beer (373m), one 5 oz glass of wine (148m, or one 1 oz glass of hard liquor (4442m Do not smoke or use any products that have nicotine or tobacco. If you need help quitting, ask your doctor. Avoid secondhand smoke. Do not use drugs. Activity  Try to stay at a healthy weight. Get at least 30 minutes of exercise on most days, such as: Fast walking. Biking. Swimming. Medicines Take over-the-counter and prescription medicines only as told by your doctor. Avoid taking birth control pills. Talk to your doctor about the risks of taking birth control pills if: You are over 35 63ars old. You smoke. You get very bad headaches. You have had a blood clot. Where to find more information American Stroke Association: www.strokeassociation.org Get help right away if: You or a loved one has any signs of a stroke. "BE FAST" is an easy way to remember the warning signs: B - Balance. Dizziness, sudden trouble walking, or loss of balance. E - Eyes. Trouble seeing or a change in how you see. F - Face. Sudden weakness or  loss of feeling of the face. The face or eyelid may droop on one side. A - Arms. Weakness or loss of feeling in an arm. This happens all of a sudden and most often on one side of the body. S - Speech. Sudden trouble speaking, slurred speech, or trouble understanding what people say. T - Time. Time to call emergency services. Write down what time symptoms started. You or a loved one has other signs of a stroke, such as: A sudden, very bad headache with no known cause. Feeling like you may vomit (nausea). Vomiting. A seizure. These symptoms may be an emergency. Get help right away. Call your local emergency services (911 in the [place] Do not wait to see if the symptoms will go away. Do not drive yourself to the hospital. Summary You can help to prevent a stroke by eating healthy, exercising, and not smoking. It also helps to manage any health problems you have. Do not smoke or use any products that contain nicotine or tobacco. Get help right away if you or a loved one has any  signs of a stroke. This information is not intended to replace advice given to you by your health care provider. Make sure you discuss any questions you have with your health care provider. Document Revised: 07/04/2020 Document Reviewed: 07/04/2020 Elsevier Patient Education  [place].

## 2022-08-17 NOTE — Progress Notes (Signed)
I agree with the above plan 

## 2022-08-21 ENCOUNTER — Other Ambulatory Visit: Payer: Self-pay | Admitting: Family Medicine

## 2022-08-21 ENCOUNTER — Telehealth: Payer: Self-pay | Admitting: Family Medicine

## 2022-08-21 DIAGNOSIS — E1169 Type 2 diabetes mellitus with other specified complication: Secondary | ICD-10-CM

## 2022-08-21 MED ORDER — ROSUVASTATIN CALCIUM 20 MG PO TABS: 20.0000 mg | ORAL_TABLET | Freq: Every day | ORAL | 1 refills | Status: DC

## 2022-08-21 NOTE — Telephone Encounter (Signed)
Crestor '20mg'$  sent to [facility].

## 2022-08-21 NOTE — Telephone Encounter (Signed)
  Prescription Request  08/21/2022  Is this a "Controlled Substance" medicine? no  Have you seen your PCP in the last 2 weeks? NO  If YES, route message to pool  -  If NO, patient needs to be scheduled for appointment.  What is the name of the medication or equipment? Rosuvastatin 20 mg - Dr. Darnell Level was supposed to send in at her appt  Have you contacted your pharmacy to request a refill? yes   Which pharmacy would you like this sent to? [facility]   Patient notified that their request is being sent to the clinical staff for review and that they should receive a response within 2 business days.

## 2022-08-21 NOTE — Addendum Note (Signed)
Addended by: Alphonzo Dublin on: 08/21/2022 01:14 PM   Modules accepted: Orders

## 2022-08-22 ENCOUNTER — Other Ambulatory Visit: Payer: Self-pay | Admitting: Family Medicine

## 2022-08-22 ENCOUNTER — Ambulatory Visit (INDEPENDENT_AMBULATORY_CARE_PROVIDER_SITE_OTHER): Payer: PPO

## 2022-08-22 DIAGNOSIS — I495 Sick sinus syndrome: Secondary | ICD-10-CM | POA: Diagnosis not present

## 2022-08-22 DIAGNOSIS — E119 Type 2 diabetes mellitus without complications: Secondary | ICD-10-CM

## 2022-08-23 LAB — CUP PACEART REMOTE DEVICE CHECK
Battery Impedance: 944 Ohm
Battery Remaining Longevity: 73 mo
Battery Voltage: 2.77 V
Brady Statistic AP VP Percent: 0 %
Brady Statistic AP VS Percent: 0 %
Brady Statistic AS VP Percent: 0 %
Brady Statistic AS VS Percent: 100 %
Date Time Interrogation Session: 20230906170234
Implantable Lead Implant Date: 20050914
Implantable Lead Implant Date: 20050914
Implantable Lead Location: 753859
Implantable Lead Location: 753860
Implantable Lead Model: 5076
Implantable Lead Model: 5076
Implantable Pulse Generator Implant Date: 20150605
Lead Channel Impedance Value: 495 Ohm
Lead Channel Impedance Value: 505 Ohm
Lead Channel Pacing Threshold Amplitude: 0.375 V
Lead Channel Pacing Threshold Amplitude: 0.875 V
Lead Channel Pacing Threshold Pulse Width: 0.4 ms
Lead Channel Pacing Threshold Pulse Width: 0.4 ms
Lead Channel Setting Pacing Amplitude: 2 V
Lead Channel Setting Pacing Amplitude: 2.5 V
Lead Channel Setting Pacing Pulse Width: 0.4 ms
Lead Channel Setting Sensing Sensitivity: 4 mV

## 2022-08-27 ENCOUNTER — Other Ambulatory Visit: Payer: Self-pay

## 2022-08-27 ENCOUNTER — Ambulatory Visit: Payer: PPO | Attending: Adult Health

## 2022-08-27 DIAGNOSIS — R2689 Other abnormalities of gait and mobility: Secondary | ICD-10-CM | POA: Diagnosis not present

## 2022-08-27 DIAGNOSIS — M6281 Muscle weakness (generalized): Secondary | ICD-10-CM | POA: Insufficient documentation

## 2022-08-27 DIAGNOSIS — I69398 Other sequelae of cerebral infarction: Secondary | ICD-10-CM | POA: Diagnosis not present

## 2022-08-27 DIAGNOSIS — R29898 Other symptoms and signs involving the musculoskeletal system: Secondary | ICD-10-CM | POA: Diagnosis not present

## 2022-08-27 DIAGNOSIS — R269 Unspecified abnormalities of gait and mobility: Secondary | ICD-10-CM | POA: Diagnosis not present

## 2022-08-27 NOTE — Therapy (Signed)
OUTPATIENT PHYSICAL THERAPY NEURO EVALUATION   Patient Name: [name] MRN: [id number] DOB:09/16/1951, 71 y.o., female Today's Date: 08/27/2022   REFERRING PROVIDER: Frann Rider, NP   PT End of Session - 08/27/22 1246     Visit Number 1    Number of Visits 8    Date for PT Re-Evaluation 10/12/22    PT Start Time 1300    PT Stop Time 9470    PT Time Calculation (min) 43 min    Activity Tolerance Patient tolerated treatment well    Behavior During Therapy [facility] for tasks assessed/performed             Past Medical History:  Diagnosis Date   Acute CVA (cerebrovascular accident) ([place]) 07/20/2022   Anxiety    Arthritis    of the neck   Colon polyps    Diabetes mellitus without complication ([place])    Esophageal stricture    Essential hypertension 01/05/2020   GERD (gastroesophageal reflux disease)    Hiatal Hernia   History of uterine cancer    Hyperlipidemia    Hypertension    Occipital stroke ([place]) 02/13/2019   Osteoporosis    Pacemaker    Symptomatic bradycardia    Tingling in extremities 07/20/2022   Past Surgical History:  Procedure Laterality Date   APPENDECTOMY     CHOLECYSTECTOMY     LAPAROSCOPIC TOTAL HYSTERECTOMY     LYMPH NODE DISSECTION     bilateral pelvic and right-sided periaortic   PACEMAKER GENERATOR CHANGE N/A 05/21/2014   Procedure: PACEMAKER GENERATOR CHANGE;  Surgeon: Evans Lance, MD;  Location: [facility] CATH LAB;  Service: Cardiovascular;  Laterality: N/A;   status post pacemaker     metronic [id number]   TRANSTHORACIC ECHOCARDIOGRAM  08/30/04   TUBAL LIGATION     Patient Active Problem List   Diagnosis Date Noted   History of lacunar cerebrovascular accident (CVA) 07/24/2022   GERD (gastroesophageal reflux disease) 11/25/2019   Anxiety 11/25/2019   Colon polyps 11/25/2019   Thyroid nodule 02/24/2019   Difficult airway for intubation 02/24/2019   BMI 29.0-29.9,adult 10/29/2016   Type 2 diabetes mellitus without complication, without  long-term current use of insulin ([place]) 01/12/2016   Vitamin D deficiency 01/21/2015   Arthritis    History of uterine cancer    Hyperlipidemia associated with type 2 diabetes mellitus ([place]) w/ goal LDL <70    Hypertension associated with diabetes ([place])    Osteopenia    Esophageal stricture    Pacemaker    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    ONSET DATE: July 2023  REFERRING DIAG: Left leg weakness; Gait disturbance, post-stroke  THERAPY DIAG:  No diagnosis found.  Rationale for Evaluation and Treatment Rehabilitation  SUBJECTIVE:  SUBJECTIVE STATEMENT: Patient reports that she had a stroke late July 2023. She has primarily noticed that she feels unsteady when she is walking. She notes that she fell once after having her stroke while she was in her kitchen. She ended up going to the hospital the next day and she was told that she had experienced a "series of mini strokes." She has noticed that she feels safer when holding onto something while she walks, but she is able to walk unsupported if she is able to go at her own pace. She has noticed that she has more trouble picking up her left foot while she is walking.  Pt accompanied by: self  PERTINENT HISTORY: chronic BLE pain, HTN, DM, OA, osteopenia, history of cancer, history of CVA, pacemaker   PAIN:  Are you having pain? No  PRECAUTIONS: Fall  WEIGHT BEARING RESTRICTIONS No  FALLS: Has patient fallen in last 6 months? Yes. Number of falls 1  LIVING ENVIRONMENT: Lives with: lives with an adult companion Lives in: House/apartment Stairs: Yes: External: 3-4 steps; can reach both Has following equipment at home: None  PLOF: Independent  PATIENT GOALS improved safety, balance, and lower extremity strength  OBJECTIVE:   DIAGNOSTIC FINDINGS: CT  (Head) 07/19/22 IMPRESSION: 1. 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 2. New foci of encephalomalacia consistent with lacunar infarcts demonstrated in the left thalamus, left caudate lobe, right cerebellum, and right symptoms in the ovale. 3. No acute intracranial hemorrhage or mass effect.  COGNITION: Overall cognitive status: Within functional limits for tasks assessed   SENSATION: WFL  COORDINATION: WFL for activities assessed  POSTURE: rounded shoulders and forward head  LOWER EXTREMITY ROM:  WFL for activities assessed  LOWER EXTREMITY MMT:    MMT Right Eval Left Eval  Hip flexion 4/5 4-/5  Hip extension    Hip abduction    Hip adduction    Hip internal rotation    Hip external rotation    Knee flexion 4/5 4/5  Knee extension 5/5 4+/5  Ankle dorsiflexion 4/5 4/5  Ankle plantarflexion    Ankle inversion    Ankle eversion    (Blank rows = not tested)  TRANSFERS: Assistive device utilized: None  Sit to stand: Complete Independence Stand to sit: Complete Independence   GAIT: Gait pattern: step through pattern, decreased step length- Right, decreased step length- Left, decreased stride length, Left foot flat, and poor foot clearance- Left Assistive device utilized: None Level of assistance: Complete Independence   FUNCTIONAL TESTs:  5 times sit to stand: 18.13 seconds w/o UE support Timed up and go (TUG): 14.25 seconds  BALANCE:  Romberg:30 seconds with EO and EC  Tandem: L foot leading: 5 seconds; R foot leading: 19 seconds   TODAY'S TREATMENT:                                    9/11 EXERCISE LOG  Exercise Repetitions and Resistance Comments  Standing marching 10 reps each   Standing heel/ toe raises 15 reps each                Blank cell = exercise not performed today    PATIENT EDUCATION: Education details: HEP, healing, POC, prognosis Person educated: Patient Education method: Explanation Education  comprehension: verbalized understanding   HOME EXERCISE PROGRAM: See today's interventions (patient declined a handout)     GOALS: Goals reviewed  with patient? Yes  LONG TERM GOALS: Target date: 09/24/2022  Patient will be independent with her HEP.  Baseline:  Goal status: INITIAL  2.  Patient will improve her TUG time to 12 seconds or less for improved safety.  Baseline:  Goal status: INITIAL  3.  Patient will improve her five time sit to stand time to 12 seconds or less for improved lower extremity power and safety.  Baseline:  Goal status: INITIAL  4.  Patient will be able to safely ambulate with no significant gait deviations.  Baseline:  Goal status: INITIAL  ASSESSMENT:  CLINICAL IMPRESSION: Patient is a 71 y.o. female who was seen today for physical therapy evaluation and treatment following a CVA that occurred prior to 07/19/22. She is a high fall risk as evidenced by her TUG and five time sit to stand assessments. She also exhibited reduced left lower extremity strength and gait deviations compared to the right lower extremity. She was provided a HEP which she was able to properly demonstrate. She reported feeling comfortable with these interventions. Recommend that she continue with skilled physical therapy to address her remaining impairments to maximize her safety and functional mobility.    OBJECTIVE IMPAIRMENTS Abnormal gait, decreased balance, decreased mobility, difficulty walking, decreased strength, and postural dysfunction.   ACTIVITY LIMITATIONS standing, stairs, and locomotion level  PARTICIPATION LIMITATIONS: shopping, community activity, and yard work  PERSONAL FACTORS 3+ comorbidities: chronic BLE pain, HTN, DM, OA, osteopenia, history of cancer, history of CVA  are also affecting patient's functional outcome.   REHAB POTENTIAL: Excellent  CLINICAL DECISION MAKING: Stable/uncomplicated  EVALUATION COMPLEXITY: Low  PLAN: PT FREQUENCY:  2x/week  PT DURATION: 4 weeks  PLANNED INTERVENTIONS: Therapeutic exercises, Therapeutic activity, Neuromuscular re-education, Balance training, Gait training, Patient/Family education, Self Care, Stair training, and Re-evaluation  PLAN FOR NEXT SESSION: nustep, lower extremity strengthening and balance interventions    Darlin Coco, PT 08/27/2022, 3:59 PM

## 2022-08-29 ENCOUNTER — Ambulatory Visit: Payer: PPO | Admitting: Physical Therapy

## 2022-08-29 ENCOUNTER — Encounter: Payer: Self-pay | Admitting: Physical Therapy

## 2022-08-29 DIAGNOSIS — R2689 Other abnormalities of gait and mobility: Secondary | ICD-10-CM

## 2022-08-29 DIAGNOSIS — M6281 Muscle weakness (generalized): Secondary | ICD-10-CM

## 2022-08-29 NOTE — Therapy (Addendum)
OUTPATIENT PHYSICAL THERAPY NEURO TREATMENT   Patient Name: [name] MRN: [id number] DOB:[birth year], 71 y.o., female Today's Date: 08/29/2022   REFERRING PROVIDER: Frann Rider, NP   PT End of Session - 08/29/22 1300     Visit Number 2    Number of Visits 8    Date for PT Re-Evaluation 10/12/22    PT Start Time 1751    PT Stop Time 0258    PT Time Calculation (min) 46 min    Activity Tolerance Patient tolerated treatment well    Behavior During Therapy [facility] for tasks assessed/performed             Past Medical History:  Diagnosis Date   Acute CVA (cerebrovascular accident) ([place]) 07/20/2022   Anxiety    Arthritis    of the neck   Colon polyps    Diabetes mellitus without complication ([place])    Esophageal stricture    Essential hypertension 01/05/2020   GERD (gastroesophageal reflux disease)    Hiatal Hernia   History of uterine cancer    Hyperlipidemia    Hypertension    Occipital stroke ([place]) 02/13/2019   Osteoporosis    Pacemaker    Symptomatic bradycardia    Tingling in extremities 07/20/2022   Past Surgical History:  Procedure Laterality Date   APPENDECTOMY     CHOLECYSTECTOMY     LAPAROSCOPIC TOTAL HYSTERECTOMY     LYMPH NODE DISSECTION     bilateral pelvic and right-sided periaortic   PACEMAKER GENERATOR CHANGE N/A 05/21/2014   Procedure: PACEMAKER GENERATOR CHANGE;  Surgeon: Evans Lance, MD;  Location: [facility] CATH LAB;  Service: Cardiovascular;  Laterality: N/A;   status post pacemaker     metronic [id number]   TRANSTHORACIC ECHOCARDIOGRAM  08/30/04   TUBAL LIGATION     Patient Active Problem List   Diagnosis Date Noted   History of lacunar cerebrovascular accident (CVA) 07/24/2022   GERD (gastroesophageal reflux disease) 11/25/2019   Anxiety 11/25/2019   Colon polyps 11/25/2019   Thyroid nodule 02/24/2019   Difficult airway for intubation 02/24/2019   BMI 29.0-29.9,adult 10/29/2016   Type 2 diabetes mellitus without complication, without  long-term current use of insulin ([place]) 01/12/2016   Vitamin D deficiency 01/21/2015   Arthritis    History of uterine cancer    Hyperlipidemia associated with type 2 diabetes mellitus ([place]) w/ goal LDL <70    Hypertension associated with diabetes ([place])    Osteopenia    Esophageal stricture    Pacemaker    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    ONSET DATE: July 2023  REFERRING DIAG: Left leg weakness; Gait disturbance, post-stroke  THERAPY DIAG:  Muscle weakness (generalized)  Other abnormalities of gait and mobility  Rationale for Evaluation and Treatment Rehabilitation  SUBJECTIVE:  SUBJECTIVE STATEMENT: Reports that she feels like the unsteadiness is worse in the mornings.  PERTINENT HISTORY: chronic BLE pain, HTN, DM, OA, osteopenia, history of cancer, history of CVA, pacemaker   PAIN:  Are you having pain? No  PRECAUTIONS: Fall  WEIGHT BEARING RESTRICTIONS No   OBJECTIVE:   DIAGNOSTIC FINDINGS: CT (Head) 07/19/22 IMPRESSION: 1. 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 2. New foci of encephalomalacia consistent with lacunar infarcts demonstrated in the left thalamus, left caudate lobe, right cerebellum, and right symptoms in the ovale. 3. No acute intracranial hemorrhage or mass effect.  LOWER EXTREMITY MMT:    MMT Right Eval Left Eval  Hip flexion 4/5 4-/5  Hip extension    Hip abduction    Hip adduction    Hip internal rotation    Hip external rotation    Knee flexion 4/5 4/5  Knee extension 5/5 4+/5  Ankle dorsiflexion 4/5 4/5  Ankle plantarflexion    Ankle inversion    Ankle eversion    (Blank rows = not tested)  TODAY'S TREATMENT:                                    9/13 EXERCISE LOG  Exercise Repetitions and Resistance Comments   Nustep L3 x15 min   Heel/toe raise  X20 reps each   Hip flexion Alternating BUE support x20 reps   Hip abduction X20 reps each LE   Forward step ups BLE; 2 UE support x15 reps each   LAQ BLE; 3# x20 reps each 5 sec holds   HS curls Yellow x20 reps each   Hip clam Yellow x20 reps    Blank cell = exercise not performed today    PATIENT EDUCATION: Education details: HEP, healing, POC, prognosis Person educated: Patient Education method: Explanation Education comprehension: verbalized understanding   HOME EXERCISE PROGRAM: See today's interventions (patient declined a handout)   GOALS: Goals reviewed with patient? Yes  LONG TERM GOALS: Target date: 09/24/2022  Patient will be independent with her HEP.  Baseline:  Goal status: INITIAL  2.  Patient will improve her TUG time to 12 seconds or less for improved safety.  Baseline:  Goal status: INITIAL  3.  Patient will improve her five time sit to stand time to 12 seconds or less for improved lower extremity power and safety.  Baseline:  Goal status: INITIAL  4.  Patient will be able to safely ambulate with no significant gait deviations.  Baseline:  Goal status: INITIAL  ASSESSMENT:  CLINICAL IMPRESSION: Patient presented in clinic with reports of minimal weakness in LEs. Patient unable to fully remember any functional limitations such as stairs but she only has minimal stairs at home. Patient required only minimal multimodal cueing to correct technique. Patient educated throughout therex regarding post CVA care and expectations.   OBJECTIVE IMPAIRMENTS Abnormal gait, decreased balance, decreased mobility, difficulty walking, decreased strength, and postural dysfunction.   ACTIVITY LIMITATIONS standing, stairs, and locomotion level  PARTICIPATION LIMITATIONS: shopping, community activity, and yard work  PERSONAL FACTORS 3+ comorbidities: chronic BLE pain, HTN, DM, OA, osteopenia, history of cancer, history of CVA  are  also affecting patient's functional outcome.   REHAB POTENTIAL: Excellent  CLINICAL DECISION MAKING: Stable/uncomplicated  EVALUATION COMPLEXITY: Low  PLAN: PT FREQUENCY: 2x/week  PT DURATION: 4 weeks  PLANNED INTERVENTIONS: Therapeutic exercises, Therapeutic activity, Neuromuscular re-education, Balance training, Gait training,  Patient/Family education, Self Care, Stair training, and Re-evaluation  PLAN FOR NEXT SESSION: nustep, lower extremity strengthening and balance interventions    Standley Brooking, PTA 08/29/2022, 1:52 PM

## 2022-08-31 ENCOUNTER — Other Ambulatory Visit: Payer: Self-pay | Admitting: Family Medicine

## 2022-08-31 DIAGNOSIS — F419 Anxiety disorder, unspecified: Secondary | ICD-10-CM

## 2022-09-03 ENCOUNTER — Encounter: Payer: Self-pay | Admitting: Physical Therapy

## 2022-09-03 ENCOUNTER — Ambulatory Visit: Payer: PPO | Admitting: Physical Therapy

## 2022-09-03 DIAGNOSIS — M6281 Muscle weakness (generalized): Secondary | ICD-10-CM | POA: Diagnosis not present

## 2022-09-03 DIAGNOSIS — R2689 Other abnormalities of gait and mobility: Secondary | ICD-10-CM

## 2022-09-03 NOTE — Therapy (Signed)
OUTPATIENT PHYSICAL THERAPY NEURO TREATMENT   Patient Name: [name] MRN: [id number] DOB:January 13, 1951, 71 y.o., female Today's Date: 09/03/2022   REFERRING PROVIDER: Frann Rider, NP   PT End of Session - 09/03/22 1322     Visit Number 3    Number of Visits 8    Date for PT Re-Evaluation 10/12/22    PT Start Time 7915    PT Stop Time 1344    PT Time Calculation (min) 38 min    Activity Tolerance Patient tolerated treatment well    Behavior During Therapy [facility] for tasks assessed/performed             Past Medical History:  Diagnosis Date   Acute CVA (cerebrovascular accident) ([place]) 07/20/2022   Anxiety    Arthritis    of the neck   Colon polyps    Diabetes mellitus without complication ([place])    Esophageal stricture    Essential hypertension 01/05/2020   GERD (gastroesophageal reflux disease)    Hiatal Hernia   History of uterine cancer    Hyperlipidemia    Hypertension    Occipital stroke ([place]) 02/13/2019   Osteoporosis    Pacemaker    Symptomatic bradycardia    Tingling in extremities 07/20/2022   Past Surgical History:  Procedure Laterality Date   APPENDECTOMY     CHOLECYSTECTOMY     LAPAROSCOPIC TOTAL HYSTERECTOMY     LYMPH NODE DISSECTION     bilateral pelvic and right-sided periaortic   PACEMAKER GENERATOR CHANGE N/A 05/21/2014   Procedure: PACEMAKER GENERATOR CHANGE;  Surgeon: Evans Lance, MD;  Location: [facility] CATH LAB;  Service: Cardiovascular;  Laterality: N/A;   status post pacemaker     metronic [id number]   TRANSTHORACIC ECHOCARDIOGRAM  08/30/04   TUBAL LIGATION     Patient Active Problem List   Diagnosis Date Noted   History of lacunar cerebrovascular accident (CVA) 07/24/2022   GERD (gastroesophageal reflux disease) 11/25/2019   Anxiety 11/25/2019   Colon polyps 11/25/2019   Thyroid nodule 02/24/2019   Difficult airway for intubation 02/24/2019   BMI 29.0-29.9,adult 10/29/2016   Type 2 diabetes mellitus without complication, without  long-term current use of insulin ([place]) 01/12/2016   Vitamin D deficiency 01/21/2015   Arthritis    History of uterine cancer    Hyperlipidemia associated with type 2 diabetes mellitus ([place]) w/ goal LDL <70    Hypertension associated with diabetes ([place])    Osteopenia    Esophageal stricture    Pacemaker    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    ONSET DATE: July 2023  REFERRING DIAG: Left leg weakness; Gait disturbance, post-stroke  THERAPY DIAG:  Muscle weakness (generalized)  Other abnormalities of gait and mobility  Rationale for Evaluation and Treatment Rehabilitation  SUBJECTIVE:  SUBJECTIVE STATEMENT: No new complaints or concerns.  PERTINENT HISTORY: chronic BLE pain, HTN, DM, OA, osteopenia, history of cancer, history of CVA, pacemaker   PAIN:  Are you having pain? No  PRECAUTIONS: Fall  WEIGHT BEARING RESTRICTIONS No   OBJECTIVE:   DIAGNOSTIC FINDINGS: CT (Head) 07/19/22 IMPRESSION: 1. 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 2. New foci of encephalomalacia consistent with lacunar infarcts demonstrated in the left thalamus, left caudate lobe, right cerebellum, and right symptoms in the ovale. 3. No acute intracranial hemorrhage or mass effect.  LOWER EXTREMITY MMT:    MMT Right Eval Left Eval  Hip flexion 4/5 4-/5  Hip extension    Hip abduction    Hip adduction    Hip internal rotation    Hip external rotation    Knee flexion 4/5 4/5  Knee extension 5/5 4+/5  Ankle dorsiflexion 4/5 4/5  Ankle plantarflexion    Ankle inversion    Ankle eversion    (Blank rows = not tested)  TODAY'S TREATMENT:                                    9/18 EXERCISE LOG  Exercise Repetitions and Resistance Comments  Nustep L3 x15 min   Heel/toe raise  X20  reps each   Hip flexion Alternating BUE support x20 reps   Hip abduction X20 reps each LE   Forward step ups BLE; 2 UE support x15 reps each 4"   LAQ BLE; 3# x20 reps each 5 sec holds   Hip clam Green x20 reps   Sit <> stands X10 reps    Blank cell = exercise not performed today    PATIENT EDUCATION: Education details: HEP, healing, POC, prognosis Person educated: Patient Education method: Explanation Education comprehension: verbalized understanding   HOME EXERCISE PROGRAM: See today's interventions (patient declined a handout)   GOALS: Goals reviewed with patient? Yes  LONG TERM GOALS: Target date: 09/24/2022  Patient will be independent with her HEP.  Baseline:  Goal status: INITIAL  2.  Patient will improve her TUG time to 12 seconds or less for improved safety.  Baseline:  Goal status: INITIAL  3.  Patient will improve her five time sit to stand time to 12 seconds or less for improved lower extremity power and safety.  Baseline:  Goal status: INITIAL  4.  Patient will be able to safely ambulate with no significant gait deviations.  Baseline:  Goal status: INITIAL  ASSESSMENT:  CLINICAL IMPRESSION: Patient presented in clinic with no new complaints or concerns. Patient progressed through therex in standing and sitting. Patient had no obvious limitations but did fatigue with standing.    OBJECTIVE IMPAIRMENTS Abnormal gait, decreased balance, decreased mobility, difficulty walking, decreased strength, and postural dysfunction.   ACTIVITY LIMITATIONS standing, stairs, and locomotion level  PARTICIPATION LIMITATIONS: shopping, community activity, and yard work  PERSONAL FACTORS 3+ comorbidities: chronic BLE pain, HTN, DM, OA, osteopenia, history of cancer, history of CVA  are also affecting patient's functional outcome.   REHAB POTENTIAL: Excellent  CLINICAL DECISION MAKING: Stable/uncomplicated  EVALUATION COMPLEXITY: Low  PLAN: PT FREQUENCY:  2x/week  PT DURATION: 4 weeks  PLANNED INTERVENTIONS: Therapeutic exercises, Therapeutic activity, Neuromuscular re-education, Balance training, Gait training, Patient/Family education, Self Care, Stair training, and Re-evaluation  PLAN FOR NEXT SESSION: nustep, lower extremity strengthening and balance interventions    Standley Brooking, PTA  09/03/2022, 2:01 PM

## 2022-09-05 ENCOUNTER — Encounter: Payer: Self-pay | Admitting: Physical Therapy

## 2022-09-05 ENCOUNTER — Ambulatory Visit: Payer: PPO | Admitting: Physical Therapy

## 2022-09-05 DIAGNOSIS — R2689 Other abnormalities of gait and mobility: Secondary | ICD-10-CM

## 2022-09-05 DIAGNOSIS — M6281 Muscle weakness (generalized): Secondary | ICD-10-CM

## 2022-09-05 NOTE — Therapy (Signed)
OUTPATIENT PHYSICAL THERAPY NEURO TREATMENT   Patient Name: [name] MRN: [id number] DOB:Jun 28, 1951, 71 y.o., female Today's Date: 09/05/2022   REFERRING PROVIDER: Frann Rider, NP   PT End of Session - 09/05/22 1314     Visit Number 4    Number of Visits 8    Date for PT Re-Evaluation 10/12/22    PT Start Time 1301    PT Stop Time 8416    PT Time Calculation (min) 44 min    Activity Tolerance Patient tolerated treatment well    Behavior During Therapy [facility] for tasks assessed/performed             Past Medical History:  Diagnosis Date   Acute CVA (cerebrovascular accident) ([place]) 07/20/2022   Anxiety    Arthritis    of the neck   Colon polyps    Diabetes mellitus without complication ([place])    Esophageal stricture    Essential hypertension 01/05/2020   GERD (gastroesophageal reflux disease)    Hiatal Hernia   History of uterine cancer    Hyperlipidemia    Hypertension    Occipital stroke ([place]) 02/13/2019   Osteoporosis    Pacemaker    Symptomatic bradycardia    Tingling in extremities 07/20/2022   Past Surgical History:  Procedure Laterality Date   APPENDECTOMY     CHOLECYSTECTOMY     LAPAROSCOPIC TOTAL HYSTERECTOMY     LYMPH NODE DISSECTION     bilateral pelvic and right-sided periaortic   PACEMAKER GENERATOR CHANGE N/A 05/21/2014   Procedure: PACEMAKER GENERATOR CHANGE;  Surgeon: Evans Lance, MD;  Location: [facility] CATH LAB;  Service: Cardiovascular;  Laterality: N/A;   status post pacemaker     metronic [id number]   TRANSTHORACIC ECHOCARDIOGRAM  08/30/04   TUBAL LIGATION     Patient Active Problem List   Diagnosis Date Noted   History of lacunar cerebrovascular accident (CVA) 07/24/2022   GERD (gastroesophageal reflux disease) 11/25/2019   Anxiety 11/25/2019   Colon polyps 11/25/2019   Thyroid nodule 02/24/2019   Difficult airway for intubation 02/24/2019   BMI 29.0-29.9,adult 10/29/2016   Type 2 diabetes mellitus without complication, without  long-term current use of insulin ([place]) 01/12/2016   Vitamin D deficiency 01/21/2015   Arthritis    History of uterine cancer    Hyperlipidemia associated with type 2 diabetes mellitus ([place]) w/ goal LDL <70    Hypertension associated with diabetes ([place])    Osteopenia    Esophageal stricture    Pacemaker    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    ONSET DATE: July 2023  REFERRING DIAG: Left leg weakness; Gait disturbance, post-stroke  THERAPY DIAG:  Muscle weakness (generalized)  Other abnormalities of gait and mobility  Rationale for Evaluation and Treatment Rehabilitation  SUBJECTIVE:  SUBJECTIVE STATEMENT: No new complaints or concerns.  PERTINENT HISTORY: chronic BLE pain, HTN, DM, OA, osteopenia, history of cancer, history of CVA, pacemaker   PAIN:  Are you having pain? No  PRECAUTIONS: Fall  WEIGHT BEARING RESTRICTIONS No   OBJECTIVE:   DIAGNOSTIC FINDINGS: CT (Head) 07/19/22 IMPRESSION: 1. 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 2. New foci of encephalomalacia consistent with lacunar infarcts demonstrated in the left thalamus, left caudate lobe, right cerebellum, and right symptoms in the ovale. 3. No acute intracranial hemorrhage or mass effect.  LOWER EXTREMITY MMT:    MMT Right Eval Left Eval  Hip flexion 4/5 4-/5  Hip extension    Hip abduction    Hip adduction    Hip internal rotation    Hip external rotation    Knee flexion 4/5 4/5  Knee extension 5/5 4+/5  Ankle dorsiflexion 4/5 4/5  Ankle plantarflexion    Ankle inversion    Ankle eversion    (Blank rows = not tested)  TODAY'S TREATMENT:                                    9/18 EXERCISE LOG  Exercise Repetitions and Resistance Comments  Nustep L3 x15 min   Heel/toe raise  X20  reps each   Hip flexion Alternating BUE support x20 reps   Hip abduction X20 reps each LE   Forward step ups BLE; 2 UE support x15 reps each 4"   NBOS on airex  X3 min Intermittant UE support  Semitandem on airex X3 min Intermittant UE support  Foot on step  SLS on airex x2 min each Intermittant UE support  Marching on airex Fingertip contact x10 reps    Blank cell = exercise not performed today    PATIENT EDUCATION: Education details: HEP, healing, POC, prognosis Person educated: Patient Education method: Explanation Education comprehension: verbalized understanding   HOME EXERCISE PROGRAM: See today's interventions (patient declined a handout)   GOALS: Goals reviewed with patient? Yes  LONG TERM GOALS: Target date: 09/24/2022  Patient will be independent with her HEP.  Baseline:  Goal status: INITIAL  2.  Patient will improve her TUG time to 12 seconds or less for improved safety.  Baseline:  Goal status: INITIAL  3.  Patient will improve her five time sit to stand time to 12 seconds or less for improved lower extremity power and safety.  Baseline:  Goal status: INITIAL  4.  Patient will be able to safely ambulate with no significant gait deviations.  Baseline:  Goal status: INITIAL  ASSESSMENT:  CLINICAL IMPRESSION: Patient presented in clinic with no new complaints or concerns. Patient reports that she has always walked or worked at a slower pace and admits never feeling very strong. Patient introduced to beginning balance exercises although on uneven surface. Intermittant to fingertip contact utilized through balance. Patient demonstrated an expected but min to mod amount of ankle strategy.   OBJECTIVE IMPAIRMENTS Abnormal gait, decreased balance, decreased mobility, difficulty walking, decreased strength, and postural dysfunction.   ACTIVITY LIMITATIONS standing, stairs, and locomotion level  PARTICIPATION LIMITATIONS: shopping, community activity, and yard  work  PERSONAL FACTORS 3+ comorbidities: chronic BLE pain, HTN, DM, OA, osteopenia, history of cancer, history of CVA  are also affecting patient's functional outcome.   REHAB POTENTIAL: Excellent  CLINICAL DECISION MAKING: Stable/uncomplicated  EVALUATION COMPLEXITY: Low  PLAN: PT FREQUENCY:  2x/week  PT DURATION: 4 weeks  PLANNED INTERVENTIONS: Therapeutic exercises, Therapeutic activity, Neuromuscular re-education, Balance training, Gait training, Patient/Family education, Self Care, Stair training, and Re-evaluation  PLAN FOR NEXT SESSION: nustep, lower extremity strengthening and balance interventions    Standley Brooking, PTA 09/05/2022, 2:00 PM

## 2022-09-10 ENCOUNTER — Encounter: Payer: Self-pay | Admitting: Nurse Practitioner

## 2022-09-10 ENCOUNTER — Ambulatory Visit (INDEPENDENT_AMBULATORY_CARE_PROVIDER_SITE_OTHER): Payer: PPO | Admitting: Nurse Practitioner

## 2022-09-10 ENCOUNTER — Ambulatory Visit: Payer: PPO

## 2022-09-10 VITALS — BP 139/88 | HR 59 | Temp 97.9°F | Ht 64.0 in | Wt 150.0 lb

## 2022-09-10 DIAGNOSIS — M6281 Muscle weakness (generalized): Secondary | ICD-10-CM

## 2022-09-10 DIAGNOSIS — R300 Dysuria: Secondary | ICD-10-CM

## 2022-09-10 DIAGNOSIS — R2689 Other abnormalities of gait and mobility: Secondary | ICD-10-CM

## 2022-09-10 LAB — URINALYSIS, ROUTINE W REFLEX MICROSCOPIC
Bilirubin, UA: NEGATIVE
Glucose, UA: NEGATIVE
Ketones, UA: NEGATIVE
Leukocytes,UA: NEGATIVE
Nitrite, UA: NEGATIVE
RBC, UA: NEGATIVE
Specific Gravity, UA: 1.02 (ref 1.005–1.030)
Urobilinogen, Ur: 0.2 mg/dL (ref 0.2–1.0)
pH, UA: 5.5 (ref 5.0–7.5)

## 2022-09-10 LAB — MICROSCOPIC EXAMINATION
RBC, Urine: NONE SEEN /hpf (ref 0–2)
Renal Epithel, UA: NONE SEEN /hpf
WBC, UA: NONE SEEN /hpf (ref 0–5)

## 2022-09-10 LAB — WET PREP FOR TRICH, YEAST, CLUE
Clue Cell Exam: NEGATIVE
Trichomonas Exam: NEGATIVE
Yeast Exam: NEGATIVE

## 2022-09-10 MED ORDER — PHENAZOPYRIDINE HCL 95 MG PO TABS: 95.0000 mg | ORAL_TABLET | Freq: Three times a day (TID) | ORAL | 0 refills | Status: DC | PRN

## 2022-09-10 MED ORDER — NITROFURANTOIN MONOHYD MACRO 100 MG PO CAPS: 100.0000 mg | ORAL_CAPSULE | Freq: Two times a day (BID) | ORAL | 0 refills | Status: DC

## 2022-09-10 NOTE — Patient Instructions (Signed)

## 2022-09-10 NOTE — Progress Notes (Signed)
Remote pacemaker transmission.   

## 2022-09-10 NOTE — Therapy (Signed)
OUTPATIENT PHYSICAL THERAPY NEURO TREATMENT   Patient Name: [name] MRN: [id number] DOB:05-02-1951, 71 y.o., female Today's Date: 09/10/2022   REFERRING PROVIDER: Frann Rider, NP   PT End of Session - 09/10/22 1303     Visit Number 5    Number of Visits 8    Date for PT Re-Evaluation 10/12/22    PT Start Time 1300    PT Stop Time 6384    PT Time Calculation (min) 56 min    Activity Tolerance Patient tolerated treatment well    Behavior During Therapy [facility] for tasks assessed/performed              Past Medical History:  Diagnosis Date   Acute CVA (cerebrovascular accident) ([place]) 07/20/2022   Anxiety    Arthritis    of the neck   Colon polyps    Diabetes mellitus without complication ([place])    Esophageal stricture    Essential hypertension 01/05/2020   GERD (gastroesophageal reflux disease)    Hiatal Hernia   History of uterine cancer    Hyperlipidemia    Hypertension    Occipital stroke ([place]) 02/13/2019   Osteoporosis    Pacemaker    Symptomatic bradycardia    Tingling in extremities 07/20/2022   Past Surgical History:  Procedure Laterality Date   APPENDECTOMY     CHOLECYSTECTOMY     LAPAROSCOPIC TOTAL HYSTERECTOMY     LYMPH NODE DISSECTION     bilateral pelvic and right-sided periaortic   PACEMAKER GENERATOR CHANGE N/A 05/21/2014   Procedure: PACEMAKER GENERATOR CHANGE;  Surgeon: Evans Lance, MD;  Location: [facility] CATH LAB;  Service: Cardiovascular;  Laterality: N/A;   status post pacemaker     metronic [id number]   TRANSTHORACIC ECHOCARDIOGRAM  08/30/04   TUBAL LIGATION     Patient Active Problem List   Diagnosis Date Noted   History of lacunar cerebrovascular accident (CVA) 07/24/2022   GERD (gastroesophageal reflux disease) 11/25/2019   Anxiety 11/25/2019   Colon polyps 11/25/2019   Thyroid nodule 02/24/2019   Difficult airway for intubation 02/24/2019   BMI 29.0-29.9,adult 10/29/2016   Type 2 diabetes mellitus without complication, without  long-term current use of insulin ([place]) 01/12/2016   Vitamin D deficiency 01/21/2015   Arthritis    History of uterine cancer    Hyperlipidemia associated with type 2 diabetes mellitus ([place]) w/ goal LDL <70    Hypertension associated with diabetes ([place])    Osteopenia    Esophageal stricture    Pacemaker    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    ONSET DATE: July 2023  REFERRING DIAG: Left leg weakness; Gait disturbance, post-stroke  THERAPY DIAG:  Muscle weakness (generalized)  Other abnormalities of gait and mobility  Rationale for Evaluation and Treatment Rehabilitation  SUBJECTIVE:  SUBJECTIVE STATEMENT: Patient reports that she feels alright today. She has not had any problems since her last appointment.   PERTINENT HISTORY: chronic BLE pain, HTN, DM, OA, osteopenia, history of cancer, history of CVA, pacemaker   PAIN:  Are you having pain? No  PRECAUTIONS: Fall  WEIGHT BEARING RESTRICTIONS No   OBJECTIVE:   DIAGNOSTIC FINDINGS: CT (Head) 07/19/22 IMPRESSION: 1. 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 2. New foci of encephalomalacia consistent with lacunar infarcts demonstrated in the left thalamus, left caudate lobe, right cerebellum, and right symptoms in the ovale. 3. No acute intracranial hemorrhage or mass effect.  LOWER EXTREMITY MMT:    MMT Right Eval Left Eval  Hip flexion 4/5 4-/5  Hip extension    Hip abduction    Hip adduction    Hip internal rotation    Hip external rotation    Knee flexion 4/5 4/5  Knee extension 5/5 4+/5  Ankle dorsiflexion 4/5 4/5  Ankle plantarflexion    Ankle inversion    Ankle eversion    (Blank rows = not tested)  TODAY'S TREATMENT:                                    9/25 EXERCISE LOG  Exercise  Repetitions and Resistance Comments  Nustep L4 x 15 minutes   Rocker board 5 minutes   Marching on foam  2 minutes BUE support progressing to single UE support  Step up onto BOSU 2 minutes; alternating LE BUE support  LAQ 4# x 3 minutes    Lateral step up  20 reps each; 6" step   Semi tandem on floor 5 x 30 seconds each  Infrequent UE support  Standing hip ABD On foam; 2 minutes BUE support   Seated clams Green t-band x 25 reps    Blank cell = exercise not performed today                                    9/18 EXERCISE LOG  Exercise Repetitions and Resistance Comments  Nustep L3 x15 min   Heel/toe raise  X20 reps each   Hip flexion Alternating BUE support x20 reps   Hip abduction X20 reps each LE   Forward step ups BLE; 2 UE support x15 reps each 4"   NBOS on airex  X3 min Intermittant UE support  Semitandem on airex X3 min Intermittant UE support  Foot on step  SLS on airex x2 min each Intermittant UE support  Marching on airex Fingertip contact x10 reps    Blank cell = exercise not performed today    PATIENT EDUCATION: Education details: HEP, healing, progress with therapy Person educated: Patient Education method: Explanation Education comprehension: verbalized understanding   HOME EXERCISE PROGRAM: See today's interventions (patient declined a handout)   GOALS: Goals reviewed with patient? Yes  LONG TERM GOALS: Target date: 09/24/2022  Patient will be independent with her HEP.  Baseline:  Goal status: INITIAL  2.  Patient will improve her TUG time to 12 seconds or less for improved safety.  Baseline:  Goal status: INITIAL  3.  Patient will improve her five time sit to stand time to 12 seconds or less for improved lower extremity power and safety.  Baseline:  Goal status: INITIAL  4.  Patient will  be able to safely ambulate with no significant gait deviations.  Baseline:  Goal status: INITIAL  ASSESSMENT:  CLINICAL IMPRESSION: Patient was progressed  with multiple new and familiar interventions for improved lower extremity strength and stability needed for improved safety and functional mobility. She required minimal cueing with standing hip abduction to prevent lumbar side bending to isolate hip abductor engagement. She experienced a mild increase in lower extremity fatigue with today's interventions, but this did not limit her ability to complete any of today's interventions. She reported that her legs felt a little tired, but she was good otherwise at the conclusion of treatment. She continues to require skilled physical therapy to address her remaining impairments to return to her prior level of function.    OBJECTIVE IMPAIRMENTS Abnormal gait, decreased balance, decreased mobility, difficulty walking, decreased strength, and postural dysfunction.   ACTIVITY LIMITATIONS standing, stairs, and locomotion level  PARTICIPATION LIMITATIONS: shopping, community activity, and yard work  PERSONAL FACTORS 3+ comorbidities: chronic BLE pain, HTN, DM, OA, osteopenia, history of cancer, history of CVA  are also affecting patient's functional outcome.   REHAB POTENTIAL: Excellent  CLINICAL DECISION MAKING: Stable/uncomplicated  EVALUATION COMPLEXITY: Low  PLAN: PT FREQUENCY: 2x/week  PT DURATION: 4 weeks  PLANNED INTERVENTIONS: Therapeutic exercises, Therapeutic activity, Neuromuscular re-education, Balance training, Gait training, Patient/Family education, Self Care, Stair training, and Re-evaluation  PLAN FOR NEXT SESSION: nustep, lower extremity strengthening and balance interventions    Darlin Coco, PT 09/10/2022, 3:04 PM

## 2022-09-10 NOTE — Progress Notes (Signed)
Acute Office Visit  Subjective:     Patient ID: [name], female    DOB: [birth year], 71 y.o.   MRN: [id number]  Chief Complaint  Patient presents with   Dysuria    Burning / itching been going on for a little over a month    Vaginal Itching    Friday got really bad - states she thinks it is coming from her linagliptin - stopped taking it - been off for 5 days now - states she has felt better since she stopped      Dysuria  This is a new problem. The current episode started yesterday. The problem occurs every urination. The problem has been unchanged. The quality of the pain is described as burning and aching. The pain is moderate. There has been no fever. Associated symptoms include frequency and urgency. Pertinent negatives include no chills, discharge, flank pain, nausea or vomiting. Treatments tried: Monistat OTC, baking soda and water. The treatment provided no relief.    Review of Systems  Constitutional: Negative.  Negative for chills.  HENT: Negative.    Eyes: Negative.   Respiratory: Negative.    Cardiovascular: Negative.   Gastrointestinal: Negative.  Negative for nausea and vomiting.  Genitourinary:  Positive for dysuria, frequency and urgency. Negative for flank pain.  Musculoskeletal: Negative.   Skin: Negative.   All other systems reviewed and are negative.       Objective:    BP 139/88   Pulse (!) 59   Temp 97.9 F (36.6 C)   Ht '5\' 4"'$  (1.626 m)   Wt 150 lb (68 kg)   SpO2 95%   BMI 25.75 kg/m  BP Readings from Last 3 Encounters:  09/10/22 139/88  08/16/22 (!) 142/79  08/06/22 127/82      Physical Exam Vitals and nursing note reviewed.  Constitutional:      Appearance: Normal appearance.  HENT:     Head: Normocephalic.     Right Ear: External ear normal.     Left Ear: External ear normal.     Mouth/Throat:     Pharynx: Oropharynx is clear.  Cardiovascular:     Rate and Rhythm: Normal rate and regular rhythm.     Pulses: Normal  pulses.     Heart sounds: Normal heart sounds.  Pulmonary:     Effort: Pulmonary effort is normal.     Breath sounds: Normal breath sounds.  Abdominal:     General: Bowel sounds are normal.     Tenderness: There is no abdominal tenderness. There is no right CVA tenderness or left CVA tenderness.  Skin:    General: Skin is warm.     Findings: No erythema or rash.  Neurological:     General: No focal deficit present.     Mental Status: She is alert and oriented to person, place, and time.     No results found for any visits on 09/10/22.      Assessment & Plan:   UTI  vs  interstitial cystitis -urinalysis completed results pending -pyridium for pain -Macrobid 100 mg tablet by mouth twice daily.  For dysuria and burning -Discontinue Monistat, baking soda water to reduce irritation. Precaution and education provided -All questions answered -follow up with unresolved symptoms  Problem List Items Addressed This Visit   None Visit Diagnoses     Dysuria    -  Primary   Relevant Medications   phenazopyridine (PYRIDIUM) 95 MG tablet   nitrofurantoin, macrocrystal-monohydrate, (MACROBID) 100  MG capsule   Other Relevant Orders   Urinalysis, Routine w reflex microscopic   CULTURE, URINE COMPREHENSIVE   WET PREP FOR TRICH, YEAST, CLUE       Meds ordered this encounter  Medications   phenazopyridine (PYRIDIUM) 95 MG tablet    Sig: Take 1 tablet (95 mg total) by mouth 3 (three) times daily as needed for pain.    Dispense:  10 tablet    Refill:  0    Order Specific Question:   Supervising Provider    Answer:   Claretta Fraise [[id number]]   nitrofurantoin, macrocrystal-monohydrate, (MACROBID) 100 MG capsule    Sig: Take 1 capsule (100 mg total) by mouth 2 (two) times daily. 1 po BId    Dispense:  14 capsule    Refill:  0    Order Specific Question:   Supervising Provider    Answer:   Claretta Fraise [[id number]]    Return if symptoms worsen or fail to improve.  Ivy Lynn,  NP

## 2022-09-11 ENCOUNTER — Ambulatory Visit: Payer: PPO | Admitting: Family Medicine

## 2022-09-12 ENCOUNTER — Encounter: Payer: Self-pay | Admitting: Physical Therapy

## 2022-09-12 ENCOUNTER — Ambulatory Visit: Payer: PPO | Admitting: Physical Therapy

## 2022-09-12 DIAGNOSIS — R2689 Other abnormalities of gait and mobility: Secondary | ICD-10-CM

## 2022-09-12 DIAGNOSIS — M6281 Muscle weakness (generalized): Secondary | ICD-10-CM | POA: Diagnosis not present

## 2022-09-12 LAB — CULTURE, URINE COMPREHENSIVE

## 2022-09-12 NOTE — Therapy (Signed)
OUTPATIENT PHYSICAL THERAPY NEURO TREATMENT   Patient Name: [name] MRN: [id number] DOB:1951-09-17, 71 y.o., female Today's Date: 09/12/2022   REFERRING PROVIDER: Frann Rider, NP   PT End of Session - 09/12/22 1317     Visit Number 6    Number of Visits 8    Date for PT Re-Evaluation 10/12/22    PT Start Time 4680    PT Stop Time 3212    PT Time Calculation (min) 43 min    Activity Tolerance Patient tolerated treatment well    Behavior During Therapy [facility] for tasks assessed/performed              Past Medical History:  Diagnosis Date   Acute CVA (cerebrovascular accident) ([place]) 07/20/2022   Anxiety    Arthritis    of the neck   Colon polyps    Diabetes mellitus without complication ([place])    Esophageal stricture    Essential hypertension 01/05/2020   GERD (gastroesophageal reflux disease)    Hiatal Hernia   History of uterine cancer    Hyperlipidemia    Hypertension    Occipital stroke ([place]) 02/13/2019   Osteoporosis    Pacemaker    Symptomatic bradycardia    Tingling in extremities 07/20/2022   Past Surgical History:  Procedure Laterality Date   APPENDECTOMY     CHOLECYSTECTOMY     LAPAROSCOPIC TOTAL HYSTERECTOMY     LYMPH NODE DISSECTION     bilateral pelvic and right-sided periaortic   PACEMAKER GENERATOR CHANGE N/A 05/21/2014   Procedure: PACEMAKER GENERATOR CHANGE;  Surgeon: Evans Lance, MD;  Location: [facility] CATH LAB;  Service: Cardiovascular;  Laterality: N/A;   status post pacemaker     metronic [id number]   TRANSTHORACIC ECHOCARDIOGRAM  08/30/04   TUBAL LIGATION     Patient Active Problem List   Diagnosis Date Noted   History of lacunar cerebrovascular accident (CVA) 07/24/2022   GERD (gastroesophageal reflux disease) 11/25/2019   Anxiety 11/25/2019   Colon polyps 11/25/2019   Thyroid nodule 02/24/2019   Difficult airway for intubation 02/24/2019   BMI 29.0-29.9,adult 10/29/2016   Type 2 diabetes mellitus without complication, without  long-term current use of insulin ([place]) 01/12/2016   Vitamin D deficiency 01/21/2015   Arthritis    History of uterine cancer    Hyperlipidemia associated with type 2 diabetes mellitus ([place]) w/ goal LDL <70    Hypertension associated with diabetes ([place])    Osteopenia    Esophageal stricture    Pacemaker    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    ONSET DATE: July 2023  REFERRING DIAG: Left leg weakness; Gait disturbance, post-stroke  THERAPY DIAG:  Muscle weakness (generalized)  Other abnormalities of gait and mobility  Rationale for Evaluation and Treatment Rehabilitation  SUBJECTIVE:  SUBJECTIVE STATEMENT: No new complaints.  PERTINENT HISTORY: chronic BLE pain, HTN, DM, OA, osteopenia, history of cancer, history of CVA, pacemaker   PAIN:  Are you having pain? No  PRECAUTIONS: Fall  WEIGHT BEARING RESTRICTIONS No   OBJECTIVE:   DIAGNOSTIC FINDINGS: CT (Head) 07/19/22 IMPRESSION: 1. 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 2. New foci of encephalomalacia consistent with lacunar infarcts demonstrated in the left thalamus, left caudate lobe, right cerebellum, and right symptoms in the ovale. 3. No acute intracranial hemorrhage or mass effect.  LOWER EXTREMITY MMT:    MMT Right Eval Left Eval  Hip flexion 4/5 4-/5  Hip extension    Hip abduction    Hip adduction    Hip internal rotation    Hip external rotation    Knee flexion 4/5 4/5  Knee extension 5/5 4+/5  Ankle dorsiflexion 4/5 4/5  Ankle plantarflexion    Ankle inversion    Ankle eversion    (Blank rows = not tested)  TODAY'S TREATMENT:                                    9/27 EXERCISE LOG  Exercise Repetitions and Resistance Comments  Nustep L4 x 15 minutes   Heel/toe raises X20 reps    Hip flexion Standing x20 reps   Sit <> stands Mid height no UE support x15 reps   EO on airex NBOS X1 min   EC on airex NBOS X3 min with intermittant UE support   Toe taps on airex  X20 reps Intermittant UE support  Semi tandem on airex X3 min Infrequent UE support  Standing hip ABD On foam; 2 minutes BUE support    Blank cell = exercise not performed today   PATIENT EDUCATION: Education details: HEP, healing, progress with therapy Person educated: Patient Education method: Explanation Education comprehension: verbalized understanding   HOME EXERCISE PROGRAM: See today's interventions (patient declined a handout)   GOALS: Goals reviewed with patient? Yes  LONG TERM GOALS: Target date: 09/24/2022  Patient will be independent with her HEP.  Baseline:  Goal status: INITIAL  2.  Patient will improve her TUG time to 12 seconds or less for improved safety.  Baseline:  Goal status: INITIAL  3.  Patient will improve her five time sit to stand time to 12 seconds or less for improved lower extremity power and safety.  Baseline:  Goal status: INITIAL  4.  Patient will be able to safely ambulate with no significant gait deviations.  Baseline:  Goal status: INITIAL  ASSESSMENT:  CLINICAL IMPRESSION: Patient progressed to more balance activities today in which she demonstrated normal ankle strategy. Patient mostly challenged with head turns as well as eyes closed. Intermittant UE support required throughout balance session. Patient notes that one of her current challenges is sit <> stands.   OBJECTIVE IMPAIRMENTS Abnormal gait, decreased balance, decreased mobility, difficulty walking, decreased strength, and postural dysfunction.   ACTIVITY LIMITATIONS standing, stairs, and locomotion level  PARTICIPATION LIMITATIONS: shopping, community activity, and yard work  PERSONAL FACTORS 3+ comorbidities: chronic BLE pain, HTN, DM, OA, osteopenia, history of cancer, history of CVA  are  also affecting patient's functional outcome.   REHAB POTENTIAL: Excellent  CLINICAL DECISION MAKING: Stable/uncomplicated  EVALUATION COMPLEXITY: Low  PLAN: PT FREQUENCY: 2x/week  PT DURATION: 4 weeks  PLANNED INTERVENTIONS: Therapeutic exercises, Therapeutic activity, Neuromuscular re-education, Balance training, Gait  training, Patient/Family education, Self Care, Stair training, and Re-evaluation  PLAN FOR NEXT SESSION: nustep, lower extremity strengthening and balance interventions    Standley Brooking, PTA 09/12/2022, 3:18 PM

## 2022-09-17 ENCOUNTER — Ambulatory Visit: Payer: PPO | Attending: Adult Health

## 2022-09-17 DIAGNOSIS — R2689 Other abnormalities of gait and mobility: Secondary | ICD-10-CM | POA: Diagnosis not present

## 2022-09-17 DIAGNOSIS — M6281 Muscle weakness (generalized): Secondary | ICD-10-CM | POA: Diagnosis not present

## 2022-09-17 NOTE — Therapy (Signed)
OUTPATIENT PHYSICAL THERAPY NEURO TREATMENT   Patient Name: [name] MRN: [id number] DOB:August 12, 1951, 71 y.o., female Today's Date: 09/17/2022   REFERRING PROVIDER: Frann Rider, NP   PT End of Session - 09/17/22 1337     Visit Number 7    Number of Visits 8    Date for PT Re-Evaluation 10/12/22    PT Start Time 1300    PT Stop Time 9373    PT Time Calculation (min) 43 min    Activity Tolerance Patient tolerated treatment well    Behavior During Therapy [facility] for tasks assessed/performed               Past Medical History:  Diagnosis Date   Acute CVA (cerebrovascular accident) ([place]) 07/20/2022   Anxiety    Arthritis    of the neck   Colon polyps    Diabetes mellitus without complication ([place])    Esophageal stricture    Essential hypertension 01/05/2020   GERD (gastroesophageal reflux disease)    Hiatal Hernia   History of uterine cancer    Hyperlipidemia    Hypertension    Occipital stroke ([place]) 02/13/2019   Osteoporosis    Pacemaker    Symptomatic bradycardia    Tingling in extremities 07/20/2022   Past Surgical History:  Procedure Laterality Date   APPENDECTOMY     CHOLECYSTECTOMY     LAPAROSCOPIC TOTAL HYSTERECTOMY     LYMPH NODE DISSECTION     bilateral pelvic and right-sided periaortic   PACEMAKER GENERATOR CHANGE N/A 05/21/2014   Procedure: PACEMAKER GENERATOR CHANGE;  Surgeon: Evans Lance, MD;  Location: [facility] CATH LAB;  Service: Cardiovascular;  Laterality: N/A;   status post pacemaker     metronic [id number]   TRANSTHORACIC ECHOCARDIOGRAM  08/30/04   TUBAL LIGATION     Patient Active Problem List   Diagnosis Date Noted   History of lacunar cerebrovascular accident (CVA) 07/24/2022   GERD (gastroesophageal reflux disease) 11/25/2019   Anxiety 11/25/2019   Colon polyps 11/25/2019   Thyroid nodule 02/24/2019   Difficult airway for intubation 02/24/2019   BMI 29.0-29.9,adult 10/29/2016   Type 2 diabetes mellitus without complication, without  long-term current use of insulin ([place]) 01/12/2016   Vitamin D deficiency 01/21/2015   Arthritis    History of uterine cancer    Hyperlipidemia associated with type 2 diabetes mellitus ([place]) w/ goal LDL <70    Hypertension associated with diabetes ([place])    Osteopenia    Esophageal stricture    Pacemaker    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    ONSET DATE: July 2023  REFERRING DIAG: Left leg weakness; Gait disturbance, post-stroke  THERAPY DIAG:  Muscle weakness (generalized)  Other abnormalities of gait and mobility  Rationale for Evaluation and Treatment Rehabilitation  SUBJECTIVE:  SUBJECTIVE STATEMENT: Patient reports that her right leg is a little sore and tired today.   PERTINENT HISTORY: chronic BLE pain, HTN, DM, OA, osteopenia, history of cancer, history of CVA, pacemaker   PAIN:  Are you having pain? No  PRECAUTIONS: Fall  WEIGHT BEARING RESTRICTIONS No   OBJECTIVE:   DIAGNOSTIC FINDINGS: CT (Head) 07/19/22 IMPRESSION: 1. 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 2. New foci of encephalomalacia consistent with lacunar infarcts demonstrated in the left thalamus, left caudate lobe, right cerebellum, and right symptoms in the ovale. 3. No acute intracranial hemorrhage or mass effect.  LOWER EXTREMITY MMT:    MMT Right Eval Left Eval  Hip flexion 4/5 4-/5  Hip extension    Hip abduction    Hip adduction    Hip internal rotation    Hip external rotation    Knee flexion 4/5 4/5  Knee extension 5/5 4+/5  Ankle dorsiflexion 4/5 4/5  Ankle plantarflexion    Ankle inversion    Ankle eversion    (Blank rows = not tested)  TODAY'S TREATMENT:                                    10/2 EXERCISE LOG  Exercise Repetitions and Resistance Comments   Nustep  L4 x 15 minutes   Marching on foam pad 2 minutes   EO narrow BOS on foam 2 x 1 minute    EC narrow BOS on foam 2 minutes Need for minA intermittently for safety due to loss of balance  Rocker board 4 minutes   Lunges onto BOSU 5 reps each Limited by bilateral knee pain; modified to step ups  Step up  4" step x 2 minutes   Sit to stand 20 reps  Elevated seat  Lateral step up 4" step x 2 minutes   Standing hip extension 2 minutes    Blank cell = exercise not performed today                                    9/27 EXERCISE LOG  Exercise Repetitions and Resistance Comments  Nustep L4 x 15 minutes   Heel/toe raises X20 reps   Hip flexion Standing x20 reps   Sit <> stands Mid height no UE support x15 reps   EO on airex NBOS X1 min   EC on airex NBOS X3 min with intermittant UE support   Toe taps on airex  X20 reps Intermittant UE support  Semi tandem on airex X3 min Infrequent UE support  Standing hip ABD On foam; 2 minutes BUE support    Blank cell = exercise not performed today   PATIENT EDUCATION: Education details: HEP, healing, progress with therapy Person educated: Patient Education method: Explanation Education comprehension: verbalized understanding   HOME EXERCISE PROGRAM: See today's interventions (patient declined a handout)   GOALS: Goals reviewed with patient? Yes  LONG TERM GOALS: Target date: 09/24/2022  Patient will be independent with her HEP.  Baseline:  Goal status: INITIAL  2.  Patient will improve her TUG time to 12 seconds or less for improved safety.  Baseline:  Goal status: INITIAL  3.  Patient will improve her five time sit to stand time to 12 seconds or less for improved lower extremity power and safety.  Baseline:  Goal  status: INITIAL  4.  Patient will be able to safely ambulate with no significant gait deviations.  Baseline:  Goal status: INITIAL  ASSESSMENT:  CLINICAL IMPRESSION: Patient was progressed with multiple familiar  interventions for improved lower extremity strength and stability. She required minimal cueing with standing hip extension to avoid lumbar flexion to promote posterior chain engagement. She required occasional minimal assistance when standing with her eyes closed for safety to prevent a fall. She reported feeling good upon the conclusion up treatment. Recommend that she continue with skilled physical therapy to address her remaining impairments to return to her prior level of function.    OBJECTIVE IMPAIRMENTS Abnormal gait, decreased balance, decreased mobility, difficulty walking, decreased strength, and postural dysfunction.   ACTIVITY LIMITATIONS standing, stairs, and locomotion level  PARTICIPATION LIMITATIONS: shopping, community activity, and yard work  PERSONAL FACTORS 3+ comorbidities: chronic BLE pain, HTN, DM, OA, osteopenia, history of cancer, history of CVA  are also affecting patient's functional outcome.   REHAB POTENTIAL: Excellent  CLINICAL DECISION MAKING: Stable/uncomplicated  EVALUATION COMPLEXITY: Low  PLAN: PT FREQUENCY: 2x/week  PT DURATION: 4 weeks  PLANNED INTERVENTIONS: Therapeutic exercises, Therapeutic activity, Neuromuscular re-education, Balance training, Gait training, Patient/Family education, Self Care, Stair training, and Re-evaluation  PLAN FOR NEXT SESSION: d/c with HEP   Darlin Coco, PT 09/17/2022, 3:16 PM

## 2022-09-19 ENCOUNTER — Encounter: Payer: Self-pay | Admitting: Physical Therapy

## 2022-09-19 ENCOUNTER — Ambulatory Visit: Payer: PPO | Admitting: Physical Therapy

## 2022-09-19 DIAGNOSIS — M6281 Muscle weakness (generalized): Secondary | ICD-10-CM | POA: Diagnosis not present

## 2022-09-19 DIAGNOSIS — R2689 Other abnormalities of gait and mobility: Secondary | ICD-10-CM

## 2022-09-19 NOTE — Therapy (Addendum)
OUTPATIENT PHYSICAL THERAPY NEURO TREATMENT   Patient Name: [name] MRN: [id number] DOB:1951-07-03, 71 y.o., female Today's Date: 09/19/2022   REFERRING PROVIDER: Frann Rider, NP   PT End of Session - 09/19/22 1303     Visit Number 8    Number of Visits 8    Date for PT Re-Evaluation 10/12/22    PT Start Time 3754    PT Stop Time 3606    PT Time Calculation (min) 41 min    Activity Tolerance Patient tolerated treatment well    Behavior During Therapy [facility] for tasks assessed/performed             Past Medical History:  Diagnosis Date   Acute CVA (cerebrovascular accident) ([place]) 07/20/2022   Anxiety    Arthritis    of the neck   Colon polyps    Diabetes mellitus without complication ([place])    Esophageal stricture    Essential hypertension 01/05/2020   GERD (gastroesophageal reflux disease)    Hiatal Hernia   History of uterine cancer    Hyperlipidemia    Hypertension    Occipital stroke ([place]) 02/13/2019   Osteoporosis    Pacemaker    Symptomatic bradycardia    Tingling in extremities 07/20/2022   Past Surgical History:  Procedure Laterality Date   APPENDECTOMY     CHOLECYSTECTOMY     LAPAROSCOPIC TOTAL HYSTERECTOMY     LYMPH NODE DISSECTION     bilateral pelvic and right-sided periaortic   PACEMAKER GENERATOR CHANGE N/A 05/21/2014   Procedure: PACEMAKER GENERATOR CHANGE;  Surgeon: Evans Lance, MD;  Location: [facility] CATH LAB;  Service: Cardiovascular;  Laterality: N/A;   status post pacemaker     metronic [id number]   TRANSTHORACIC ECHOCARDIOGRAM  08/30/04   TUBAL LIGATION     Patient Active Problem List   Diagnosis Date Noted   History of lacunar cerebrovascular accident (CVA) 07/24/2022   GERD (gastroesophageal reflux disease) 11/25/2019   Anxiety 11/25/2019   Colon polyps 11/25/2019   Thyroid nodule 02/24/2019   Difficult airway for intubation 02/24/2019   BMI 29.0-29.9,adult 10/29/2016   Type 2 diabetes mellitus without complication, without  long-term current use of insulin ([place]) 01/12/2016   Vitamin D deficiency 01/21/2015   Arthritis    History of uterine cancer    Hyperlipidemia associated with type 2 diabetes mellitus ([place]) w/ goal LDL <70    Hypertension associated with diabetes ([place])    Osteopenia    Esophageal stricture    Pacemaker    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    ONSET DATE: July 2023  REFERRING DIAG: Left leg weakness; Gait disturbance, post-stroke  THERAPY DIAG:  Muscle weakness (generalized)  Other abnormalities of gait and mobility  Rationale for Evaluation and Treatment Rehabilitation  SUBJECTIVE:  SUBJECTIVE STATEMENT: Has not been back to walking in her neighborhood but does take recycling and trash out.   PERTINENT HISTORY: chronic BLE pain, HTN, DM, OA, osteopenia, history of cancer, history of CVA, pacemaker   PAIN:  Are you having pain? No  PRECAUTIONS: Fall  WEIGHT BEARING RESTRICTIONS No  OBJECTIVE:   DIAGNOSTIC FINDINGS: CT (Head) 07/19/22 IMPRESSION: 1. 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 2. New foci of encephalomalacia consistent with lacunar infarcts demonstrated in the left thalamus, left caudate lobe, right cerebellum, and right symptoms in the ovale. 3. No acute intracranial hemorrhage or mass effect.  LOWER EXTREMITY MMT:    MMT Right Eval Left Eval  Hip flexion 4/5 4-/5  Hip extension    Hip abduction    Hip adduction    Hip internal rotation    Hip external rotation    Knee flexion 4/5 4/5  Knee extension 5/5 4+/5  Ankle dorsiflexion 4/5 4/5  Ankle plantarflexion    Ankle inversion    Ankle eversion    (Blank rows = not tested)  TODAY'S TREATMENT:                                    10/4 EXERCISE LOG  Exercise Repetitions and Resistance  Comments  Nustep  L4 x 15 minutes   EO narrow BOS on foam 2 x 1 minute    EC narrow BOS on foam 2 minutes Need for minA intermittently for safety due to loss of balance  Sit to stand X5 reps Normal height; 10 sec  Standing hip extension X20 reps BLE   Standing hip abduction X20 reps BLE   Mini squats X20 reps   Heel/toe raises  X20 reps each    Blank cell = exercise not performed today   Sit to stands: 5 reps in 10 sec TUG: 12 seconds Gait: minimal L trunk lean and B short stride length  PATIENT EDUCATION: Education details: HEP, healing, progress with therapy Person educated: Patient Education method: Explanation Education comprehension: verbalized understanding   HOME EXERCISE PROGRAM: [id number]  GOALS: Goals reviewed with patient? Yes  LONG TERM GOALS: Target date: 09/24/2022  Patient will be independent with her HEP.  Baseline:  Goal status: MET  2.  Patient will improve her TUG time to 12 seconds or less for improved safety.  Baseline:  Goal status: MET  3.  Patient will improve her five time sit to stand time to 12 seconds or less for improved lower extremity power and safety.  Baseline:  Goal status: MET  4.  Patient will be able to safely ambulate with no significant gait deviations.  Baseline:  Goal status: MET  ASSESSMENT:  CLINICAL IMPRESSION: Patient presented in clinic with reports of feeling good. Patient able to achieve all LTGs and felt confident with ADLs. Patient has not returned back fully to walking program in her neighborhood but does take out her trash and recycling to the street. Patient guided through HEP via VCs and demo to fully understand HEP instructions. Patient felt confident with new HEP.  PHYSICAL THERAPY DISCHARGE SUMMARY  Visits from Start of Care: 8  Current functional level related to goals / functional outcomes: Patient was able to meet all of her goals for physical therapy.    Remaining deficits: none   Education /  Equipment: HEP    Patient agrees to discharge. Patient goals  were met. Patient is being discharged due to meeting the stated rehab goals.  Jacqulynn Cadet, PT, DPT    OBJECTIVE IMPAIRMENTS Abnormal gait, decreased balance, decreased mobility, difficulty walking, decreased strength, and postural dysfunction.   ACTIVITY LIMITATIONS standing, stairs, and locomotion level  PARTICIPATION LIMITATIONS: shopping, community activity, and yard work  PERSONAL FACTORS 3+ comorbidities: chronic BLE pain, HTN, DM, OA, osteopenia, history of cancer, history of CVA  are also affecting patient's functional outcome.   REHAB POTENTIAL: Excellent  CLINICAL DECISION MAKING: Stable/uncomplicated  EVALUATION COMPLEXITY: Low  PLAN: PT FREQUENCY: 2x/week  PT DURATION: 4 weeks  PLANNED INTERVENTIONS: Therapeutic exercises, Therapeutic activity, Neuromuscular re-education, Balance training, Gait training, Patient/Family education, Self Care, Stair training, and Re-evaluation  PLAN FOR NEXT SESSION: D/C   Standley Brooking, PTA 09/19/2022, 1:57 PM

## 2022-10-04 ENCOUNTER — Other Ambulatory Visit: Payer: Self-pay | Admitting: Family Medicine

## 2022-10-04 DIAGNOSIS — K219 Gastro-esophageal reflux disease without esophagitis: Secondary | ICD-10-CM

## 2022-10-24 ENCOUNTER — Encounter: Payer: Self-pay | Admitting: Family Medicine

## 2022-10-24 ENCOUNTER — Ambulatory Visit (INDEPENDENT_AMBULATORY_CARE_PROVIDER_SITE_OTHER): Payer: PPO | Admitting: Family Medicine

## 2022-10-24 ENCOUNTER — Ambulatory Visit (INDEPENDENT_AMBULATORY_CARE_PROVIDER_SITE_OTHER): Payer: PPO

## 2022-10-24 VITALS — BP 131/76 | HR 60 | Temp 98.3°F | Ht 64.0 in | Wt 149.4 lb

## 2022-10-24 DIAGNOSIS — M8589 Other specified disorders of bone density and structure, multiple sites: Secondary | ICD-10-CM | POA: Diagnosis not present

## 2022-10-24 DIAGNOSIS — K219 Gastro-esophageal reflux disease without esophagitis: Secondary | ICD-10-CM

## 2022-10-24 DIAGNOSIS — E1159 Type 2 diabetes mellitus with other circulatory complications: Secondary | ICD-10-CM

## 2022-10-24 DIAGNOSIS — E1169 Type 2 diabetes mellitus with other specified complication: Secondary | ICD-10-CM | POA: Diagnosis not present

## 2022-10-24 DIAGNOSIS — M8588 Other specified disorders of bone density and structure, other site: Secondary | ICD-10-CM | POA: Diagnosis not present

## 2022-10-24 DIAGNOSIS — I152 Hypertension secondary to endocrine disorders: Secondary | ICD-10-CM

## 2022-10-24 DIAGNOSIS — Z780 Asymptomatic menopausal state: Secondary | ICD-10-CM | POA: Diagnosis not present

## 2022-10-24 DIAGNOSIS — Z23 Encounter for immunization: Secondary | ICD-10-CM

## 2022-10-24 DIAGNOSIS — T466X5A Adverse effect of antihyperlipidemic and antiarteriosclerotic drugs, initial encounter: Secondary | ICD-10-CM | POA: Diagnosis not present

## 2022-10-24 DIAGNOSIS — M609 Myositis, unspecified: Secondary | ICD-10-CM | POA: Diagnosis not present

## 2022-10-24 DIAGNOSIS — E785 Hyperlipidemia, unspecified: Secondary | ICD-10-CM

## 2022-10-24 LAB — BAYER DCA HB A1C WAIVED: HB A1C (BAYER DCA - WAIVED): 6.4 % — ABNORMAL HIGH (ref 4.8–5.6)

## 2022-10-24 MED ORDER — PANTOPRAZOLE SODIUM 40 MG PO TBEC: 40.0000 mg | DELAYED_RELEASE_TABLET | Freq: Every day | ORAL | 3 refills | Status: DC

## 2022-10-24 MED ORDER — METFORMIN HCL 500 MG PO TABS: 500.0000 mg | ORAL_TABLET | Freq: Every day | ORAL | 3 refills | Status: DC

## 2022-10-24 MED ORDER — ONETOUCH ULTRA VI STRP: ORAL_STRIP | 2 refills | Status: DC

## 2022-10-24 MED ORDER — ONETOUCH DELICA LANCETS 33G MISC: 2 refills | Status: DC

## 2022-10-24 NOTE — Progress Notes (Signed)
Subjective: CC:DM PCP: Janora Norlander, DO [id number] J Luber is a 71 y.o. female presenting to clinic today for:  1. Type 2 Diabetes with hypertension, hyperlipidemia:  Patient is off of the Tradjenta as of 2 months ago.  She has been compliant with the metformin, Crestor and Plavix but admits that the Crestor is causing myalgia and she is interested in potentially switching over to [place].  Blood pressures have been running less than 140s over 90s at home.  No chest pain, shortness of breath, dizziness, visual disturbance.  Last eye exam: Release of information form completed Last foot exam: needs Last A1c:  Lab Results  Component Value Date   HGBA1C 7.0 (H) 07/20/2022   Nephropathy screen indicated?: needs Last flu, zoster and/or pneumovax:  Immunization History  Administered Date(s) Administered   Fluad Quad(high Dose 65+) 09/07/2019, 11/06/2021, 10/24/2022   Influenza,inj,Quad PF,6+ Mos 10/29/2016, 01/03/2018, 10/08/2018   Moderna SARS-COV2 Booster Vaccination 01/18/2021   Moderna Sars-Covid-2 Vaccination 02/25/2020, 03/29/2020   Pneumococcal Conjugate-13 08/02/2017   Pneumococcal Polysaccharide-23 08/04/2018   Tdap 10/30/2011   Zoster Recombinat (Shingrix) 02/07/2022, 05/09/2022    ROS: Per HPI  Allergies  Allergen Reactions   Glipizide Itching   Pravachol [Pravastatin Sodium] Other (See Comments)    Stomach pain and constipation    Past Medical History:  Diagnosis Date   Acute CVA (cerebrovascular accident) ([place]) 07/20/2022   Anxiety    Arthritis    of the neck   Colon polyps    Diabetes mellitus without complication ([place])    Esophageal stricture    Essential hypertension 01/05/2020   GERD (gastroesophageal reflux disease)    Hiatal Hernia   History of uterine cancer    Hyperlipidemia    Hypertension    Occipital stroke ([place]) 02/13/2019   Osteoporosis    Pacemaker    Symptomatic bradycardia    Tingling in extremities 07/20/2022    Current  Outpatient Medications:    Blood Glucose Monitoring Suppl (ONE TOUCH ULTRA 2) w/Device KIT, 1 Device by Does not apply route daily., Disp: 1 each, Rfl: 0   BYSTOLIC 5 MG tablet, TAKE (1) TABLET TWICE A DAY., Disp: 180 tablet, Rfl: 3   calcium citrate-vitamin D (CITRACAL+D) 315-200 MG-UNIT per tablet, Take 1 tablet by mouth daily., Disp: , Rfl:    carboxymethylcellulose (REFRESH PLUS) 0.5 % SOLN, Place 1 drop into both eyes daily as needed (dry eyes)., Disp: , Rfl:    clopidogrel (PLAVIX) 75 MG tablet, Take 1 tablet (75 mg total) by mouth daily., Disp: 90 tablet, Rfl: 3   COLLAGEN PO, Take 0.5 Scoops by mouth daily., Disp: , Rfl:    diclofenac Sodium (VOLTAREN) 1 % GEL, Apply 4 g topically 4 (four) times daily. (Patient taking differently: Apply 4 g topically 2 (two) times daily as needed (pain).), Disp: 400 g, Rfl: PRN   metFORMIN (GLUCOPHAGE) 500 MG tablet, Take 1 tablet (500 mg total) by mouth daily with supper., Disp: 90 tablet, Rfl: 0   OneTouch Delica Lancets 43K MISC, CHECK BLOOD SUGAR ONCE DAILY OR AS DIRECTED, Disp: 100 each, Rfl: 2   ONETOUCH ULTRA test strip, CHECK BLOOD SUGAR ONCE DAILY OR AS DIRECTED, Disp: 100 strip, Rfl: 2   pantoprazole (PROTONIX) 40 MG tablet, TAKE 1 TABLET DAILY, Disp: 90 tablet, Rfl: 0   rosuvastatin (CRESTOR) 20 MG tablet, Take 1 tablet (20 mg total) by mouth daily., Disp: 90 tablet, Rfl: 1   sertraline (ZOLOFT) 50 MG tablet, TAKE 1 TABLET DAILY, Disp: 90  tablet, Rfl: 3 Social History   Socioeconomic History   Marital status: Significant Other    Spouse name: Not on file   Number of children: 1   Years of education: GED   Highest education level: GED or equivalent  Occupational History   Occupation: retired    Comment: Engineer, manufacturing systems  Tobacco Use   Smoking status: Never   Smokeless tobacco: Never  Scientific laboratory technician Use: Never used  Substance and Sexual Activity   Alcohol use: No    Comment: may have a social drink rarely   Drug use: No   Sexual  activity: Not Currently  Other Topics Concern   Not on file  Social History Narrative   Lives with sig other   Social Determinants of Health   Financial Resource Strain: Low Risk  (11/13/2021)   Overall Financial Resource Strain (CARDIA)    Difficulty of Paying Living Expenses: Not hard at all  Food Insecurity: No Food Insecurity (11/13/2021)   Hunger Vital Sign    Worried About Running Out of Food in the Last Year: Never true    [place] in the Last Year: Never true  Transportation Needs: No Transportation Needs (11/13/2021)   PRAPARE - Hydrologist (Medical): No    Lack of Transportation (Non-Medical): No  Physical Activity: Sufficiently Active (11/13/2021)   Exercise Vital Sign    Days of Exercise per Week: 5 days    Minutes of Exercise per Session: 30 min  Stress: No Stress Concern Present (11/13/2021)   [place]    Feeling of Stress : Only a little  Social Connections: Socially Integrated (11/13/2021)   Social Connection and Isolation Panel [NHANES]    Frequency of Communication with Friends and Family: More than three times a week    Frequency of Social Gatherings with Friends and Family: More than three times a week    Attends Religious Services: 1 to 4 times per year    Active Member of [employer] or Organizations: Yes    Attends Archivist Meetings: 1 to 4 times per year    Marital Status: Living with partner  Intimate Partner Violence: Not At Risk (11/13/2021)   Humiliation, Afraid, Rape, and Kick questionnaire    Fear of Current or Ex-Partner: No    Emotionally Abused: No    Physically Abused: No    Sexually Abused: No   Family History  Problem Relation Age of Onset   Heart disease Mother    Heart attack Mother    Heart disease Father    Heart attack Father    Cancer Sister        breast   Breast cancer Sister    COPD Sister    Alcohol abuse Brother     Healthy Daughter     Objective: Office vital signs reviewed. BP 131/76   Pulse 60   Temp 98.3 F (36.8 C)   Ht _0  (1.626 m)   Wt 149 lb 6.4 oz (67.8 kg)   SpO2 97%   BMI 25.64 kg/m   Physical Examination:  General: Awake, alert, well nourished, No acute distress HEENT: sclera white, MMM Cardio: regular rate and rhythm, S1S2 heard, no murmurs appreciated Pulm: clear to auscultation bilaterally, no wheezes, rhonchi or rales; normal work of breathing on room air    Assessment/ Plan: 71 y.o. female   Controlled type 2 diabetes mellitus with other  specified complication, without long-term current use of insulin ([place]) - Plan: Microalbumin / creatinine urine ratio, Bayer DCA Hb A1c Waived, metFORMIN (GLUCOPHAGE) 500 MG tablet, OneTouch Delica Lancets 79D MISC, glucose blood (ONETOUCH ULTRA) test strip, AMB Referral to Chronic Care Management Services  Statin-induced myositis - Plan: AMB Referral to Chronic Care Management Services  Hyperlipidemia associated with type 2 diabetes mellitus ([place]) w/ goal LDL <70 - Plan: AMB Referral to Chronic Care Management Services  Hypertension associated with diabetes ([place]) - Plan: AMB Referral to Chronic Care Management Services  Osteopenia of multiple sites - Plan: DG [facility] DEXA  Need for immunization against influenza - Plan: Flu Vaccine QUAD High Dose(Fluad)  Gastroesophageal reflux disease without esophagitis - Plan: pantoprazole (PROTONIX) 40 MG tablet  ROI completed for diabetic eye exam.  Plan for diabetic foot exam at next visit.  Okay to continue monotherapy with metformin as her blood sugar is controlled with this alone.  We discussed alternatives to her Crestor including Repatha and she is very interested in this.  Given her history of lacunar infarct, I think that she would likely be a candidate for this medication.  I will get her in with Almyra Free, our clinical pharmacist, to further discuss, demonstrate how to utilize the pen and  perhaps provide a sample before she proceeds with formal prescription.  Blood pressure is controlled.  No changes.  DEXA scan ordered today given history of osteopenia  Influenza vaccination administered Orders Placed This Encounter  Procedures   DG [facility] DEXA    Standing Status:   Future    Standing Expiration Date:   10/25/2023    Order Specific Question:   Reason for Exam (SYMPTOM  OR DIAGNOSIS REQUIRED)    Answer:   screen osteoporosis   Microalbumin / creatinine urine ratio   Bayer DCA Hb A1c Waived   No orders of the defined types were placed in this encounter.    Janora Norlander, DO [place] [phone]

## 2022-10-26 LAB — MICROALBUMIN / CREATININE URINE RATIO
Creatinine, Urine: 150.4 mg/dL
Microalb/Creat Ratio: 88 mg/g creat — ABNORMAL HIGH (ref 0–29)
Microalbumin, Urine: 133.1 ug/mL

## 2022-10-31 ENCOUNTER — Telehealth: Payer: Self-pay

## 2022-10-31 NOTE — Progress Notes (Signed)
  Chronic Care Management   Note  10/31/2022 Name: TIJAH HANE MRN: [id number] DOB: 02-May-1951  GENELL THEDE is a 71 y.o. year old female who is a primary care patient of Janora Norlander, DO. I reached out to [employer] by phone today in response to a referral sent by Ms. Burnice Logan Twiggs's PCP.  Ms. SAMANI DEAL was not successfully contacted today. A HIPAA compliant voice message was left requesting a return call.   Follow up plan: Additional outreach attempts will be made.  Noreene Larsson, [place], [place] [address] Direct Dial: [phone] [email or web address]

## 2022-10-31 NOTE — Progress Notes (Signed)
  Chronic Care Management   Note  10/31/2022 Name: [name] MRN: [id number] DOB: 09-18-1951  [name] is a 71 y.o. year old female who is a primary care patient of Janora Norlander, DO. I reached out to [employer] by phone today in response to a referral sent by [name]'s PCP.  [name]  agreedto scheduling an appointment with the CCM RN Case Manager   Follow up plan: Patient agreed to scheduled appointment with RN Case Manager on 11/12/2022 and Pharm D 12/12/2022.   [name], [place], [place] [address] Direct Dial: [phone] [email or web address]

## 2022-11-12 ENCOUNTER — Ambulatory Visit (INDEPENDENT_AMBULATORY_CARE_PROVIDER_SITE_OTHER): Payer: PPO | Admitting: *Deleted

## 2022-11-12 DIAGNOSIS — E1159 Type 2 diabetes mellitus with other circulatory complications: Secondary | ICD-10-CM

## 2022-11-12 DIAGNOSIS — E1169 Type 2 diabetes mellitus with other specified complication: Secondary | ICD-10-CM

## 2022-11-12 DIAGNOSIS — I152 Hypertension secondary to endocrine disorders: Secondary | ICD-10-CM

## 2022-11-12 NOTE — Patient Instructions (Signed)
Please call the care guide team at [phone] if you need to cancel or reschedule your appointment.   If you are experiencing a Mental Health or [place] or need someone to talk to, please call the Suicide and Crisis Lifeline: 988 call the [place] National Suicide Prevention Lifeline: 762-636-8639 or TTY: 941-062-9612 TTY 762-843-2647) to talk to a trained counselor call 1-800-273-TALK (toll free, 24 hour hotline) go to [facility] [place] [phone]) call the [place]: [phone] call 911   Following is a copy of your full provider care plan:   Goals Addressed             This Visit's Progress    CCM (DIABETES) EXPECTED OUTCOME:  MONITOR, SELF-MANAGE AND REDUCE SYMPTOMS OF DIABETES       Current Barriers:  Knowledge Deficits related to Diabetes management Chronic Disease Management support and education needs related to Diabetes and diet Pt reports she checks CBG 1-2 x per day, fasting ranges 133-160, random ranges near 200 most days Pt reports she drinks diet soft drinks (maybe one per day) and water Pt does light exercise such as walking to the park near her house and exercises prescribed by PT  Planned Interventions: Provided education to patient about basic DM disease process; Reviewed medications with patient and discussed importance of medication adherence;        Reviewed prescribed diet with patient carbohydrate modified; Counseled on importance of regular laboratory monitoring as prescribed;        Discussed plans with patient for ongoing care management follow up and provided patient with direct contact information for care management team;      Provided patient with written educational materials related to hypo and hyperglycemia and importance of correct treatment;       Reviewed scheduled/upcoming provider appointments including: telephone outreach with pharmacist on  12/12/22;         Advised patient, providing education and rationale, to check cbg twice daily and record        Review of patient status, including review of consultants reports, relevant laboratory and other test results, and medications completed;       Screening for signs and symptoms of depression related to chronic disease state;        Assessed social determinant of health barriers;        Reviewed importance of exercising  Symptom Management: Take medications as prescribed   Attend all scheduled provider appointments Call pharmacy for medication refills 3-7 days in advance of running out of medications Perform all self care activities independently  Perform IADL's (shopping, preparing meals, housekeeping, managing finances) independently Call provider office for new concerns or questions  check blood sugar at prescribed times: twice daily check feet daily for cuts, sores or redness enter blood sugar readings and medication or insulin into daily log take the blood sugar log to all doctor visits take the blood sugar meter to all doctor visits trim toenails straight across drink 6 to 8 glasses of water each day fill half of plate with vegetables limit fast food meals to no more than 1 per week manage portion size read food labels for fat, fiber, carbohydrates and portion size Continue walking to the park and doing exercises given to you by PT Look over education sent via My chart- hypoglycemia  Follow Up Plan: Telephone follow up appointment with care management team member scheduled for:  01/08/23 at 1045 am  CCM (HYPERTENSION) EXPECTED OUTCOME: MONITOR, SELF-MANAGE AND REDUCE SYMPTOMS OF HYPERTENSION       Current Barriers:  Knowledge Deficits related to Hypertension management Chronic Disease Management support and education needs related to Hypertension, diet Pt reports she lives alone but has boyfriend that "comes in and out frequently", pt reports she continues to  drive, is independent in all aspect of her care. Pt reports she checks blood pressure once daily and states " usually good" Pt reports she does exercises prescribed by physical therapist (per pt - attended outpatient PT sessions due to mini stroke this year)   Planned Interventions: Evaluation of current treatment plan related to hypertension self management and patient's adherence to plan as established by provider;   Reviewed prescribed diet low sodium Reviewed medications with patient and discussed importance of compliance;  Counseled on the importance of exercise goals with target of 150 minutes per week Discussed plans with patient for ongoing care management follow up and provided patient with direct contact information for care management team; Advised patient, providing education and rationale, to monitor blood pressure daily and record, calling PCP for findings outside established parameters;  Discussed complications of poorly controlled blood pressure such as heart disease, stroke, circulatory complications, vision complications, kidney impairment, sexual dysfunction;  Screening for signs and symptoms of depression related to chronic disease state;  Assessed social determinant of health barriers;  Education prescribed via My Chart- Low sodium diet  Symptom Management: Take medications as prescribed   Attend all scheduled provider appointments Call pharmacy for medication refills 3-7 days in advance of running out of medications Perform all self care activities independently  Perform IADL's (shopping, preparing meals, housekeeping, managing finances) independently Call provider office for new concerns or questions  check blood pressure daily write blood pressure results in a log or diary learn about high blood pressure keep a blood pressure log take blood pressure log to all doctor appointments keep all doctor appointments take medications for blood pressure exactly as  prescribed begin an exercise program report new symptoms to your doctor eat more whole grains, fruits and vegetables, lean meats and healthy fats Look over education sent via My chart- low sodium diet  Follow Up Plan: Telephone follow up appointment with care management team member scheduled for:  01/08/23 at 1045 am          Patient verbalizes understanding of instructions and care plan provided today and agrees to view in [place]. Active MyChart status and patient understanding of how to access instructions and care plan via MyChart confirmed with patient.     Telephone follow up appointment with care management team member scheduled for:  01/08/23 at 1045 am  Low-Sodium Eating Plan Sodium, which is an element that makes up salt, helps you maintain a healthy balance of fluids in your body. Too much sodium can increase your blood pressure and cause fluid and waste to be held in your body. Your health care provider or dietitian may recommend following this plan if you have high blood pressure (hypertension), kidney disease, liver disease, or heart failure. Eating less sodium can help lower your blood pressure, reduce swelling, and protect your heart, liver, and kidneys. What are tips for following this plan? Reading food labels The Nutrition Facts label lists the amount of sodium in one serving of the food. If you eat more than one serving, you must multiply the listed amount of sodium by the number of servings. Choose foods with less than 140 mg of sodium per  serving. Avoid foods with 300 mg of sodium or more per serving. Shopping  Look for lower-sodium products, often labeled as "low-sodium" or "no salt added." Always check the sodium content, even if foods are labeled as "unsalted" or "no salt added." Buy fresh foods. Avoid canned foods and pre-made or frozen meals. Avoid canned, cured, or processed meats. Buy breads that have less than 80 mg of sodium per slice. Cooking  Eat more  home-cooked food and less restaurant, buffet, and fast food. Avoid adding salt when cooking. Use salt-free seasonings or herbs instead of table salt or sea salt. Check with your health care provider or pharmacist before using salt substitutes. Cook with plant-based oils, such as canola, sunflower, or olive oil. Meal planning When eating at a restaurant, ask that your food be prepared with less salt or no salt, if possible. Avoid dishes labeled as brined, pickled, cured, smoked, or made with soy sauce, miso, or teriyaki sauce. Avoid foods that contain MSG (monosodium glutamate). MSG is sometimes added to [place] food, bouillon, and some canned foods. Make meals that can be grilled, baked, poached, roasted, or steamed. These are generally made with less sodium. General information Most people on this plan should limit their sodium intake to 1,500-2,000 mg (milligrams) of sodium each day. What foods should I eat? Fruits Fresh, frozen, or canned fruit. Fruit juice. Vegetables Fresh or frozen vegetables. "No salt added" canned vegetables. "No salt added" tomato sauce and paste. Low-sodium or reduced-sodium tomato and vegetable juice. Grains Low-sodium cereals, including oats, puffed wheat and rice, and shredded wheat. Low-sodium crackers. Unsalted rice. Unsalted pasta. Low-sodium bread. Whole-grain breads and whole-grain pasta. Meats and other proteins Fresh or frozen (no salt added) meat, poultry, seafood, and fish. Low-sodium canned tuna and salmon. Unsalted nuts. Dried peas, beans, and lentils without added salt. Unsalted canned beans. Eggs. Unsalted nut butters. Dairy Milk. Soy milk. Cheese that is naturally low in sodium, such as ricotta cheese, fresh mozzarella, or Swiss cheese. Low-sodium or reduced-sodium cheese. Cream cheese. Yogurt. Seasonings and condiments Fresh and dried herbs and spices. Salt-free seasonings. Low-sodium mustard and ketchup. Sodium-free salad dressing. Sodium-free light  mayonnaise. Fresh or refrigerated horseradish. Lemon juice. Vinegar. Other foods Homemade, reduced-sodium, or low-sodium soups. Unsalted popcorn and pretzels. Low-salt or salt-free chips. The items listed above may not be a complete list of foods and beverages you can eat. Contact a dietitian for more information. What foods should I avoid? Vegetables Sauerkraut, pickled vegetables, and relishes. Olives. [place] fries. Onion rings. Regular canned vegetables (not low-sodium or reduced-sodium). Regular canned tomato sauce and paste (not low-sodium or reduced-sodium). Regular tomato and vegetable juice (not low-sodium or reduced-sodium). Frozen vegetables in sauces. Grains Instant hot cereals. Bread stuffing, pancake, and biscuit mixes. Croutons. Seasoned rice or pasta mixes. Noodle soup cups. Boxed or frozen macaroni and cheese. Regular salted crackers. Self-rising flour. Meats and other proteins Meat or fish that is salted, canned, smoked, spiced, or pickled. Precooked or cured meat, such as sausages or meat loaves. [place] Salines. Ham. Pepperoni. Hot dogs. Corned beef. Chipped beef. Salt pork. Jerky. Pickled herring. Anchovies and sardines. Regular canned tuna. Salted nuts. Dairy Processed cheese and cheese spreads. Hard cheeses. Cheese curds. Blue cheese. Feta cheese. String cheese. Regular cottage cheese. Buttermilk. Canned milk. Fats and oils Salted butter. Regular margarine. Ghee. Bacon fat. Seasonings and condiments Onion salt, garlic salt, seasoned salt, table salt, and sea salt. Canned and packaged gravies. Worcestershire sauce. Tartar sauce. Barbecue sauce. Teriyaki sauce. Soy sauce, including reduced-sodium. Steak sauce.  Fish sauce. Oyster sauce. Cocktail sauce. Horseradish that you find on the shelf. Regular ketchup and mustard. Meat flavorings and tenderizers. Bouillon cubes. Hot sauce. Pre-made or packaged marinades. Pre-made or packaged taco seasonings. Relishes. Regular salad dressings.  Salsa. Other foods Salted popcorn and pretzels. Corn chips and puffs. Potato and tortilla chips. Canned or dried soups. Pizza. Frozen entrees and pot pies. The items listed above may not be a complete list of foods and beverages you should avoid. Contact a dietitian for more information. Summary Eating less sodium can help lower your blood pressure, reduce swelling, and protect your heart, liver, and kidneys. Most people on this plan should limit their sodium intake to 1,500-2,000 mg (milligrams) of sodium each day. Canned, boxed, and frozen foods are high in sodium. Restaurant foods, fast foods, and pizza are also very high in sodium. You also get sodium by adding salt to food. Try to cook at home, eat more fresh fruits and vegetables, and eat less fast food and canned, processed, or prepared foods. This information is not intended to replace advice given to you by your health care provider. Make sure you discuss any questions you have with your health care provider. Document Revised: 01/08/2020 Document Reviewed: 11/04/2019 Elsevier Patient Education  [place]. Hypoglycemia Hypoglycemia occurs when the level of sugar (glucose) in the blood is too low. Hypoglycemia can happen in people who have or do not have diabetes. It can develop quickly, and it can be a medical emergency. For most people, a blood glucose level below 70 mg/dL (3.9 mmol/L) is considered hypoglycemia. Glucose is a type of sugar that provides the body's main source of energy. Certain hormones (insulin and glucagon) control the level of glucose in the blood. Insulin lowers blood glucose, and glucagon raises blood glucose. Hypoglycemia can result from having too much insulin in the bloodstream, or from not eating enough food that contains glucose. You may also have reactive hypoglycemia, which happens within 4 hours after eating a meal. What are the causes? Hypoglycemia occurs most often in people who have diabetes and may  be caused by: Diabetes medicine. Not eating enough, or not eating often enough. Increased physical activity. Drinking alcohol on an empty stomach. If you do not have diabetes, hypoglycemia may be caused by: A tumor in the pancreas. Not eating enough, or not eating for long periods at a time (fasting). A severe infection or illness. Problems after having bariatric surgery. Organ failure, such as kidney or liver failure. Certain medicines. What increases the risk? Hypoglycemia is more likely to develop in people who: Have diabetes and take medicines to lower blood glucose. Abuse alcohol. Have a severe illness. What are the signs or symptoms? Symptoms vary depending on whether the condition is mild, moderate, or severe. Mild hypoglycemia Hunger. Sweating and feeling clammy. Dizziness or feeling light-headed. Sleepiness or restless sleep. Nausea. Increased heart rate. Headache. Blurry vision. Mood changes, such as irritability or anxiety. Tingling or numbness around the mouth, lips, or tongue. Moderate hypoglycemia Confusion and poor judgment. Behavior changes. Weakness. Irregular heartbeat. A change in coordination. Severe hypoglycemia Severe hypoglycemia is a medical emergency. It can cause: Fainting. Seizures. Loss of consciousness (coma). Death. How is this diagnosed? Hypoglycemia is diagnosed with a blood test to measure your blood glucose level. This blood test is done while you are having symptoms. Your health care provider may also do a physical exam and review your medical history. How is this treated? This condition can be treated by immediately eating  or drinking something that contains sugar with 15 grams of fast-acting carbohydrate, such as: 4 oz (120 mL) of fruit juice. 4 oz (120 mL) of regular soda (not diet soda). Several pieces of hard candy. Check food labels to find out how many pieces to eat for 15 grams. 1 Tbsp (15 mL) of sugar or honey. 4 glucose  tablets. 1 tube of glucose gel. Treating hypoglycemia if you have diabetes If you are alert and able to swallow safely, follow the 15:15 rule: Take 15 grams of a fast-acting carbohydrate. Talk with your health care provider about how much you should take. Options for getting 15 grams of fast-acting carbohydrate include: Glucose tablets (take 4 tablets). Several pieces of hard candy. Check food labels to find out how many pieces to eat for 15 grams. 4 oz (120 mL) of fruit juice. 4 oz (120 mL) of regular soda (not diet soda). 1 Tbsp (15 mL) of sugar or honey. 1 tube of glucose gel. Check your blood glucose 15 minutes after you take the carbohydrate. If the repeat blood glucose level is still at or below 70 mg/dL (3.9 mmol/L), take 15 grams of a carbohydrate again. If your blood glucose level does not increase above 70 mg/dL (3.9 mmol/L) after 3 tries, seek emergency medical care. After your blood glucose level returns to normal, eat a meal or a snack within 1 hour.  Treating severe hypoglycemia Severe hypoglycemia is when your blood glucose level is below 54 mg/dL (3 mmol/L). Severe hypoglycemia is a medical emergency. Get medical help right away. If you have severe hypoglycemia and you cannot eat or drink, you will need to be given glucagon. A family member or close friend should learn how to check your blood glucose and how to give you glucagon. Ask your health care provider if you need to have an emergency glucagon kit available. Severe hypoglycemia may need to be treated in a hospital. The treatment may include getting glucose through an IV. You may also need treatment for the cause of your hypoglycemia. Follow these instructions at home:  General instructions Take over-the-counter and prescription medicines only as told by your health care provider. Monitor your blood glucose as told by your health care provider. If you drink alcohol: Limit how much you have to: 0-1 drink a day for  women who are not pregnant. 0-2 drinks a day for men. Know how much alcohol is in your drink. In the [place] one drink equals one 12 oz bottle of beer (355 mL), one 5 oz glass of wine (148 mL), or one 1 oz glass of hard liquor (44 mL). Be sure to eat food along with drinking alcohol. Be aware that alcohol is absorbed quickly and may have lingering effects that may result in hypoglycemia later. Be sure to do ongoing glucose monitoring. Keep all follow-up visits. This is important. If you have diabetes: Always have a fast-acting carbohydrate (15 grams) option with you to treat low blood glucose. Follow your diabetes management plan as directed by your health care provider. Make sure you: Know the symptoms of hypoglycemia. It is important to treat it right away to prevent it from becoming severe. Check your blood glucose as often as told. Always check before and after exercise. Always check your blood glucose before you drive a motorized vehicle. Take your medicines as told. Follow your meal plan. Eat on time, and do not skip meals. Share your diabetes management plan with people in your workplace, school, and household. Carry a  medical alert card or wear medical alert jewelry. Where to find more information American Diabetes Association: www.diabetes.org Contact a health care provider if: You have problems keeping your blood glucose in your target range. You have frequent episodes of hypoglycemia. Get help right away if: You continue to have hypoglycemia symptoms after eating or drinking something that contains 15 grams of fast-acting carbohydrate, and you cannot get your blood glucose above 70 mg/dL (3.9 mmol/L) while following the 15:15 rule. Your blood glucose is below 54 mg/dL (3 mmol/L). You have a seizure. You faint. These symptoms may represent a serious problem that is an emergency. Do not wait to see if the symptoms will go away. Get medical help right away. Call your local emergency  services (911 in the [place] Do not drive yourself to the hospital. Summary Hypoglycemia occurs when the level of sugar (glucose) in the blood is too low. Hypoglycemia can happen in people who have or do not have diabetes. It can develop quickly, and it can be a medical emergency. Make sure you know the symptoms of hypoglycemia and how to treat it. Always have a fast-acting carbohydrate option with you to treat low blood sugar. This information is not intended to replace advice given to you by your health care provider. Make sure you discuss any questions you have with your health care provider. Document Revised: 11/03/2020 Document Reviewed: 11/03/2020 Elsevier Patient Education  [place].

## 2022-11-12 NOTE — Plan of Care (Signed)
Chronic Care Management Provider Comprehensive Care Plan    11/12/2022 Name: [name] MRN: [id number] DOB: July 11, 1951  Referral to Chronic Care Management (CCM) services was placed by Provider:  Ronnie Doss DO on Date: 10/24/22.  Chronic Condition 1: HYPERTENSION Provider Assessment and Plan Hypertension associated with diabetes ([place]) - Plan: AMB Referral to Chronic Care Management Services    Expected Outcome/Goals Addressed This Visit (Provider CCM goals/Provider Assessment and plan  CCM (HYPERTENSION) EXPECTED OUTCOME: MONITOR, SELF-MANAGE AND REDUCE SYMPTOMS OF HYPERTENSION  Symptom Management Condition 1: Take medications as prescribed   Attend all scheduled provider appointments Call pharmacy for medication refills 3-7 days in advance of running out of medications Perform all self care activities independently  Perform IADL's (shopping, preparing meals, housekeeping, managing finances) independently Call provider office for new concerns or questions  check blood pressure daily write blood pressure results in a log or diary learn about high blood pressure keep a blood pressure log take blood pressure log to all doctor appointments keep all doctor appointments take medications for blood pressure exactly as prescribed begin an exercise program report new symptoms to your doctor eat more whole grains, fruits and vegetables, lean meats and healthy fats Look over education sent via My chart- low sodium diet  Chronic Condition 2: DIABETES Provider Assessment and Plan Controlled type 2 diabetes mellitus with other specified complication, without long-term current use of insulin ([place]) - Plan: Microalbumin / creatinine urine ratio, Bayer DCA Hb A1c Waived, metFORMIN (GLUCOPHAGE) 500 MG tablet, OneTouch Delica Lancets 24Q MISC, glucose blood (ONETOUCH ULTRA) test strip, AMB Referral to Chronic Care Management Services    Expected Outcome/Goals Addressed This Visit (Provider  CCM goals/Provider Assessment and plan  CCM (DIABETES) EXPECTED OUTCOME:  MONITOR, SELF-MANAGE AND REDUCE SYMPTOMS OF DIABETES  Symptom Management Condition 2: Take medications as prescribed   Attend all scheduled provider appointments Call pharmacy for medication refills 3-7 days in advance of running out of medications Perform all self care activities independently  Perform IADL's (shopping, preparing meals, housekeeping, managing finances) independently Call provider office for new concerns or questions  check blood sugar at prescribed times: twice daily check feet daily for cuts, sores or redness enter blood sugar readings and medication or insulin into daily log take the blood sugar log to all doctor visits take the blood sugar meter to all doctor visits trim toenails straight across drink 6 to 8 glasses of water each day fill half of plate with vegetables limit fast food meals to no more than 1 per week manage portion size read food labels for fat, fiber, carbohydrates and portion size Continue walking to the park and doing exercises given to you by PT Look over education sent via My chart- hypoglycemia  Problem List Patient Active Problem List   Diagnosis Date Noted   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   History of lacunar cerebrovascular accident (CVA) 07/24/2022   GERD (gastroesophageal reflux disease) 11/25/2019   Anxiety 11/25/2019   Colon polyps 11/25/2019   Thyroid nodule 02/24/2019   Difficult airway for intubation 02/24/2019   BMI 29.0-29.9,adult 10/29/2016   Type 2 diabetes mellitus without complication, without long-term current use of insulin ([place]) 01/12/2016   Vitamin D deficiency 01/21/2015   Arthritis    History of uterine cancer    Hyperlipidemia associated with type 2 diabetes mellitus ([place]) w/ goal LDL <70    Hypertension associated with diabetes ([place])    Osteopenia    Esophageal stricture    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    Medication  Management  Current Outpatient Medications:    Blood Glucose Monitoring Suppl (ONE TOUCH ULTRA 2) w/Device KIT, 1 Device by Does not apply route daily., Disp: 1 each, Rfl: 0   BYSTOLIC 5 MG tablet, TAKE (1) TABLET TWICE A DAY., Disp: 180 tablet, Rfl: 3   calcium citrate-vitamin D (CITRACAL+D) 315-200 MG-UNIT per tablet, Take 1 tablet by mouth daily., Disp: , Rfl:    carboxymethylcellulose (REFRESH PLUS) 0.5 % SOLN, Place 1 drop into both eyes daily as needed (dry eyes)., Disp: , Rfl:    clopidogrel (PLAVIX) 75 MG tablet, Take 1 tablet (75 mg total) by mouth daily., Disp: 90 tablet, Rfl: 3   COLLAGEN PO, Take 0.5 Scoops by mouth daily., Disp: , Rfl:    diclofenac Sodium (VOLTAREN) 1 % GEL, Apply 4 g topically 4 (four) times daily. (Patient taking differently: Apply 4 g topically 2 (two) times daily as needed (pain).), Disp: 400 g, Rfl: PRN   glucose blood (ONETOUCH ULTRA) test strip, CHECK BLOOD SUGAR up to 4 times DAILY OR AS DIRECTED E11.9, Disp: 100 strip, Rfl: 2   metFORMIN (GLUCOPHAGE) 500 MG tablet, Take 1 tablet (500 mg total) by mouth daily with supper., Disp: 90 tablet, Rfl: 3   OneTouch Delica Lancets 05W MISC, CHECK BLOOD SUGAR up to 4 times DAILY OR AS DIRECTED.E11.9, Disp: 100 each, Rfl: 2   pantoprazole (PROTONIX) 40 MG tablet, Take 1 tablet (40 mg total) by mouth daily., Disp: 90 tablet, Rfl: 3   rosuvastatin (CRESTOR) 20 MG tablet, Take 1 tablet (20 mg total) by mouth daily., Disp: 90 tablet, Rfl: 1   sertraline (ZOLOFT) 50 MG tablet, TAKE 1 TABLET DAILY, Disp: 90 tablet, Rfl: 3  Cognitive Assessment Identity Confirmed: : Name; DOB Cognitive Status: Normal   Functional Assessment Hearing Difficulty or Deaf: yes Hearing Management: bil hearing aides Wear Glasses or Blind: yes Vision Management: can see well w/ glasses Concentrating, Remembering or Making Decisions Difficulty (CP): no Difficulty Communicating: no Difficulty Eating/Swallowing: no Walking or Climbing Stairs  Difficulty: no Dressing/Bathing Difficulty: no Doing Errands Independently Difficulty (such as shopping) (CP): no   Caregiver Assessment  Primary Source of Support/Comfort: significant other Name of Support/Comfort Primary Source: boyfriend "comes in and out" People in Home: alone   Planned Interventions  Evaluation of current treatment plan related to hypertension self management and patient's adherence to plan as established by provider;   Reviewed prescribed diet low sodium Reviewed medications with patient and discussed importance of compliance;  Counseled on the importance of exercise goals with target of 150 minutes per week Discussed plans with patient for ongoing care management follow up and provided patient with direct contact information for care management team; Advised patient, providing education and rationale, to monitor blood pressure daily and record, calling PCP for findings outside established parameters;  Discussed complications of poorly controlled blood pressure such as heart disease, stroke, circulatory complications, vision complications, kidney impairment, sexual dysfunction;  Screening for signs and symptoms of depression related to chronic disease state;  Assessed social determinant of health barriers;  Education prescribed via My Chart- Low sodium diet Provided education to patient about basic DM disease process; Reviewed medications with patient and discussed importance of medication adherence;        Reviewed prescribed diet with patient carbohydrate modified; Counseled on importance of regular laboratory monitoring as prescribed;        Discussed plans with patient for ongoing care management follow up and provided patient  with direct contact information for care management team;      Provided patient with written educational materials related to hypo and hyperglycemia and importance of correct treatment;       Reviewed scheduled/upcoming provider  appointments including: telephone outreach with pharmacist on 12/12/22;         Advised patient, providing education and rationale, to check cbg twice daily and record        Review of patient status, including review of consultants reports, relevant laboratory and other test results, and medications completed;       Screening for signs and symptoms of depression related to chronic disease state;        Assessed social determinant of health barriers;        Reviewed importance of exercising  Interaction and coordination with outside resources, practitioners, and providers See CCM Referral  Care Plan: Available in MyChart

## 2022-11-12 NOTE — Chronic Care Management (AMB) (Signed)
Chronic Care Management   CCM RN Visit Note  11/12/2022 Name: [name] MRN: [id number] DOB: 1951-04-11  Subjective: [name] is a 71 y.o. year old female who is a primary care patient of Janora Norlander, DO. The patient was referred to the Chronic Care Management team for assistance with care management needs subsequent to provider initiation of CCM services and plan of care.    Today's Visit:  Engaged with patient by telephone for initial visit.     SDOH Interventions Today    Flowsheet Row Most Recent Value  SDOH Interventions   Food Insecurity Interventions Intervention Not Indicated  Housing Interventions Intervention Not Indicated  Transportation Interventions Intervention Not Indicated  Utilities Interventions Intervention Not Indicated  Financial Strain Interventions Intervention Not Indicated  Physical Activity Interventions Intervention Not Indicated  Stress Interventions Intervention Not Indicated  Social Connections Interventions Patient Refused         Goals Addressed             This Visit's Progress    CCM (DIABETES) EXPECTED OUTCOME:  MONITOR, SELF-MANAGE AND REDUCE SYMPTOMS OF DIABETES       Current Barriers:  Knowledge Deficits related to Diabetes management Chronic Disease Management support and education needs related to Diabetes and diet Pt reports she checks CBG 1-2 x per day, fasting ranges 133-160, random ranges near 200 most days Pt reports she drinks diet soft drinks (maybe one per day) and water Pt does light exercise such as walking to the park near her house and exercises prescribed by PT  Planned Interventions: Provided education to patient about basic DM disease process; Reviewed medications with patient and discussed importance of medication adherence;        Reviewed prescribed diet with patient carbohydrate modified; Counseled on importance of regular laboratory monitoring as prescribed;        Discussed plans with  patient for ongoing care management follow up and provided patient with direct contact information for care management team;      Provided patient with written educational materials related to hypo and hyperglycemia and importance of correct treatment;       Reviewed scheduled/upcoming provider appointments including: telephone outreach with pharmacist on 12/12/22;         Advised patient, providing education and rationale, to check cbg twice daily and record        Review of patient status, including review of consultants reports, relevant laboratory and other test results, and medications completed;       Screening for signs and symptoms of depression related to chronic disease state;        Assessed social determinant of health barriers;        Reviewed importance of exercising  Symptom Management: Take medications as prescribed   Attend all scheduled provider appointments Call pharmacy for medication refills 3-7 days in advance of running out of medications Perform all self care activities independently  Perform IADL's (shopping, preparing meals, housekeeping, managing finances) independently Call provider office for new concerns or questions  check blood sugar at prescribed times: twice daily check feet daily for cuts, sores or redness enter blood sugar readings and medication or insulin into daily log take the blood sugar log to all doctor visits take the blood sugar meter to all doctor visits trim toenails straight across drink 6 to 8 glasses of water each day fill half of plate with vegetables limit fast food meals to no more than 1 per week manage portion size  read food labels for fat, fiber, carbohydrates and portion size Continue walking to the park and doing exercises given to you by PT Look over education sent via My chart- hypoglycemia  Follow Up Plan: Telephone follow up appointment with care management team member scheduled for:  01/08/23 at 1045 am       CCM  (HYPERTENSION) EXPECTED OUTCOME: MONITOR, SELF-MANAGE AND REDUCE SYMPTOMS OF HYPERTENSION       Current Barriers:  Knowledge Deficits related to Hypertension management Chronic Disease Management support and education needs related to Hypertension, diet Pt reports she lives alone but has boyfriend that "comes in and out frequently", pt reports she continues to drive, is independent in all aspect of her care. Pt reports she checks blood pressure once daily and states " usually good" Pt reports she does exercises prescribed by physical therapist (per pt - attended outpatient PT sessions due to mini stroke this year)   Planned Interventions: Evaluation of current treatment plan related to hypertension self management and patient's adherence to plan as established by provider;   Reviewed prescribed diet low sodium Reviewed medications with patient and discussed importance of compliance;  Counseled on the importance of exercise goals with target of 150 minutes per week Discussed plans with patient for ongoing care management follow up and provided patient with direct contact information for care management team; Advised patient, providing education and rationale, to monitor blood pressure daily and record, calling PCP for findings outside established parameters;  Discussed complications of poorly controlled blood pressure such as heart disease, stroke, circulatory complications, vision complications, kidney impairment, sexual dysfunction;  Screening for signs and symptoms of depression related to chronic disease state;  Assessed social determinant of health barriers;  Education prescribed via My Chart- Low sodium diet  Symptom Management: Take medications as prescribed   Attend all scheduled provider appointments Call pharmacy for medication refills 3-7 days in advance of running out of medications Perform all self care activities independently  Perform IADL's (shopping, preparing meals,  housekeeping, managing finances) independently Call provider office for new concerns or questions  check blood pressure daily write blood pressure results in a log or diary learn about high blood pressure keep a blood pressure log take blood pressure log to all doctor appointments keep all doctor appointments take medications for blood pressure exactly as prescribed begin an exercise program report new symptoms to your doctor eat more whole grains, fruits and vegetables, lean meats and healthy fats Look over education sent via My chart- low sodium diet  Follow Up Plan: Telephone follow up appointment with care management team member scheduled for:  01/08/23 at 1045 am          Plan:Telephone follow up appointment with care management team member scheduled for:  01/08/23 at [place] am  Jacqlyn Larsen [facility], BSN RN Case Manager [place] [phone]

## 2022-11-15 DIAGNOSIS — I10 Essential (primary) hypertension: Secondary | ICD-10-CM | POA: Diagnosis not present

## 2022-11-15 DIAGNOSIS — E1159 Type 2 diabetes mellitus with other circulatory complications: Secondary | ICD-10-CM | POA: Diagnosis not present

## 2022-11-21 ENCOUNTER — Ambulatory Visit (INDEPENDENT_AMBULATORY_CARE_PROVIDER_SITE_OTHER): Payer: PPO

## 2022-11-21 DIAGNOSIS — I495 Sick sinus syndrome: Secondary | ICD-10-CM | POA: Diagnosis not present

## 2022-11-21 LAB — CUP PACEART REMOTE DEVICE CHECK
Battery Impedance: 1049 Ohm
Battery Remaining Longevity: 69 mo
Battery Voltage: 2.77 V
Brady Statistic AP VP Percent: 0 %
Brady Statistic AP VS Percent: 1 %
Brady Statistic AS VP Percent: 0 %
Brady Statistic AS VS Percent: 99 %
Date Time Interrogation Session: 20231206092816
Implantable Lead Connection Status: 753985
Implantable Lead Connection Status: 753985
Implantable Lead Implant Date: 20050914
Implantable Lead Implant Date: 20050914
Implantable Lead Location: 753859
Implantable Lead Location: 753860
Implantable Lead Model: 5076
Implantable Lead Model: 5076
Implantable Pulse Generator Implant Date: 20150605
Lead Channel Impedance Value: 500 Ohm
Lead Channel Impedance Value: 502 Ohm
Lead Channel Pacing Threshold Amplitude: 0.375 V
Lead Channel Pacing Threshold Amplitude: 0.75 V
Lead Channel Pacing Threshold Pulse Width: 0.4 ms
Lead Channel Pacing Threshold Pulse Width: 0.4 ms
Lead Channel Setting Pacing Amplitude: 2 V
Lead Channel Setting Pacing Amplitude: 2.5 V
Lead Channel Setting Pacing Pulse Width: 0.4 ms
Lead Channel Setting Sensing Sensitivity: 4 mV
Zone Setting Status: 755011
Zone Setting Status: 755011

## 2022-12-12 ENCOUNTER — Telehealth: Payer: Self-pay | Admitting: Pharmacist

## 2022-12-12 ENCOUNTER — Ambulatory Visit (INDEPENDENT_AMBULATORY_CARE_PROVIDER_SITE_OTHER): Payer: PPO | Admitting: Pharmacist

## 2022-12-12 DIAGNOSIS — E1169 Type 2 diabetes mellitus with other specified complication: Secondary | ICD-10-CM

## 2022-12-12 DIAGNOSIS — G720 Drug-induced myopathy: Secondary | ICD-10-CM

## 2022-12-12 MED ORDER — REPATHA SURECLICK 140 MG/ML ~~LOC~~ SOAJ: 140.0000 mg | SUBCUTANEOUS | 5 refills | Status: DC

## 2022-12-12 NOTE — Progress Notes (Signed)
Chronic Care Management Pharmacy Note  12/12/2022 Name:  [name] MRN:  [id number] DOB:  Jun 13, 1951  Summary: Hyperlipidemia:  New goal. Uncontrolled: current treatment:NONE--new start repatha in JAN 2024; follows with cardiology/neuro (history of 2 strokes, T2DM) Medications previously tried: rosuvastatin 76m, 219m(has tried alternative dosing, etc & reports rib pain and shortness of breath--2 separate trials) ; zetia not effective Lipid Panel     Component Value Date/Time   CHOL 223 (H) 07/20/2022 0529   CHOL 164 05/09/2022 1306   CHOL 242 (H) 03/02/2013 1606   TRIG 134 07/20/2022 0529   TRIG 229 (H) 04/15/2014 1207   TRIG 343 (H) 03/02/2013 1606   HDL 49 07/20/2022 0529   HDL 52 05/09/2022 1306   HDL 45 04/15/2014 1207   HDL 43 03/02/2013 1606   CHOLHDL 4.6 07/20/2022 0529   VLDL 27 07/20/2022 0529   LDLCALC 147 (H) 07/20/2022 0529   LDLCALC 70 05/09/2022 1306   LDLCALC 108 (H) 04/15/2014 1207   LDLCALC 130 (H) 03/02/2013 1606   LDLDIRECT 67 09/07/2019 1315   LABVLDL 42 (H) 05/09/2022 1306  Current dietary patterns: recommended heart healthy/mediterranean Recommended repatha--covered tier 3; G72 ICD for statin myopathy Assessed [place] finances. [place]   Patient Goals/Self-Care Activities patient will:  - take medications as prescribed as evidenced by patient report and record review collaborate with provider on medication access solutions engage in dietary modifications by  FOLLOWING A HEART HEALTHY DIET/HEALTHY PLATE METHOD   Subjective: Stephanie STRALEYs an [age].o. year old female who is a primary patient of GoJanora NorlanderDO.  The patient was referred to the Chronic Care Management team for assistance with care management needs subsequent to provider initiation of CCM services and plan of care.    Engaged with patient by telephone for follow up visit in response to provider referral for CCM  services.   Objective:  LABS:    Lab Results  Component Value Date   CREATININE 0.83 07/24/2022   CREATININE 0.74 07/20/2022   CREATININE 0.74 07/19/2022     Lab Results  Component Value Date   HGBA1C 6.4 (H) 10/24/2022         Component Value Date/Time   CHOL 223 (H) 07/20/2022 0529   CHOL 164 05/09/2022 1306   CHOL 242 (H) 03/02/2013 1606   TRIG 134 07/20/2022 0529   TRIG 229 (H) 04/15/2014 1207   TRIG 343 (H) 03/02/2013 1606   HDL 49 07/20/2022 0529   HDL 52 05/09/2022 1306   HDL 45 04/15/2014 1207   HDL 43 03/02/2013 1606   CHOLHDL 4.6 07/20/2022 0529   VLDL 27 07/20/2022 0529   LDLCALC 147 (H) 07/20/2022 0529   LDLCALC 70 05/09/2022 1306   LDLCALC 108 (H) 04/15/2014 1207   LDLCALC 130 (H) 03/02/2013 1606   LDLDIRECT 67 09/07/2019 1315     Clinical ASCVD: Yes   The ASCVD Risk score (Arnett DK, et al., 2019) failed to calculate for the following reasons:   The patient has a prior MI or stroke diagnosis    Other: (CHADS2VASc if Afib, PHQ9 if depression, MMRC or CAT for COPD, ACT, DEXA)    BP Readings from Last 3 Encounters:  10/24/22 131/76  09/10/22 139/88  08/16/22 (!) 142/79      SDOH:  (Social Determinants of Health) assessments and interventions performed:    Allergies  Allergen Reactions   Glipizide Itching   Pravachol [Pravastatin  Sodium] Other (See Comments)    Stomach pain and constipation     Medications Reviewed Today     Reviewed by Lavera Guise, [facility] (Pharmacist) on 12/12/22 at [place] List Status: <None>   Medication Order Taking? Sig Documenting Provider Last Dose Status Informant  Blood Glucose Monitoring Suppl (ONE TOUCH ULTRA 2) w/Device KIT [id number] No 1 Device by Does not apply route daily. Eustaquio Maize, MD Taking Active Self  BYSTOLIC 5 MG tablet [id number] No TAKE (1) TABLET TWICE A DAY. Evans Lance, MD Taking Active Self  calcium citrate-vitamin D (CITRACAL+D) 315-200 MG-UNIT per tablet [id number] No Take 1 tablet  by mouth daily. [provider] Taking Active Self  carboxymethylcellulose (REFRESH PLUS) 0.5 % SOLN [id number] No Place 1 drop into both eyes daily as needed (dry eyes). [provider] Taking Active Self  clopidogrel (PLAVIX) 75 MG tablet [id number] No Take 1 tablet (75 mg total) by mouth daily. Janora Norlander, DO Taking Active   COLLAGEN PO [id number] No Take 0.5 Scoops by mouth daily. [provider] Taking Active Self  diclofenac Sodium (VOLTAREN) 1 % GEL [id number] No Apply 4 g topically 4 (four) times daily.  Patient taking differently: Apply 4 g topically 2 (two) times daily as needed (pain).   Janora Norlander, DO Taking Active Self  glucose blood (ONETOUCH ULTRA) test strip [id number] No CHECK BLOOD SUGAR up to 4 times DAILY OR AS DIRECTED E11.9 Ronnie Doss M, DO Taking Active   metFORMIN (GLUCOPHAGE) 500 MG tablet [id number] No Take 1 tablet (500 mg total) by mouth daily with supper. Janora Norlander, DO Taking Active   OneTouch Delica Lancets 63K MISC [id number] No CHECK BLOOD SUGAR up to 4 times DAILY OR AS DIRECTED.E11.9 Ronnie Doss M, DO Taking Active   pantoprazole (PROTONIX) 40 MG tablet [id number] No Take 1 tablet (40 mg total) by mouth daily. Ronnie Doss M, DO Taking Active   rosuvastatin (CRESTOR) 20 MG tablet [id number] No Take 1 tablet (20 mg total) by mouth daily. Ronnie Doss M, DO Taking Active   sertraline (ZOLOFT) 50 MG tablet [id number] No TAKE 1 TABLET DAILY Ronnie Doss M, DO Taking Active               Goals Addressed               This Visit's Progress     Patient Stated     HLD PHARMD GOAL (pt-stated)        Current Barriers:  Unable to independently afford treatment regimen Unable to achieve control of hyperlipidemia  Suboptimal therapeutic regimen for hyperlipidemia  Pharmacist Clinical Goal(s):  patient will verbalize ability to afford treatment regimen achieve control of HLD as evidenced  by LDL<70, <55 through collaboration with PharmD and provider.    Interventions: 1:1 collaboration with Janora Norlander, DO regarding development and update of comprehensive plan of care as evidenced by provider attestation and co-signature Inter-disciplinary care team collaboration (see longitudinal plan of care) Comprehensive medication review performed; medication list updated in electronic medical record  Hyperlipidemia:  New goal. Uncontrolled: current treatment:NONE--new start repatha in JAN 2024; follows with cardiology/neuro (history of 2 strokes, T2DM) Medications previously tried: rosuvastatin 43m, 223m(has tried alternative dosing, etc & reports rib pain and shortness of breath--2 separate trials) ; zetia not effective Lipid Panel     Component Value Date/Time   CHOL 223 (H) 07/20/2022 0529   CHOL 164 05/09/2022 1306   CHOL 242 (H)  03/02/2013 1606   TRIG 134 07/20/2022 0529   TRIG 229 (H) 04/15/2014 1207   TRIG 343 (H) 03/02/2013 1606   HDL 49 07/20/2022 0529   HDL 52 05/09/2022 1306   HDL 45 04/15/2014 1207   HDL 43 03/02/2013 1606   CHOLHDL 4.6 07/20/2022 0529   VLDL 27 07/20/2022 0529   LDLCALC 147 (H) 07/20/2022 0529   LDLCALC 70 05/09/2022 1306   LDLCALC 108 (H) 04/15/2014 1207   LDLCALC 130 (H) 03/02/2013 1606   LDLDIRECT 67 09/07/2019 1315   LABVLDL 42 (H) 05/09/2022 1306  Current dietary patterns: recommended heart healthy/mediterranean Recommended repatha--covered tier 3; G72 ICD for statin myopathy Assessed patient finances. Enrolled patient in [place] for 2024; will completed [place]   Patient Goals/Self-Care Activities patient will:  - take medications as prescribed as evidenced by patient report and record review collaborate with provider on medication access solutions engage in dietary modifications by  FOLLOWING A HEART HEALTHY DIET/HEALTHY PLATE METHOD         Plan: Telephone follow up appointment with care management team member  scheduled for:  12/28/22    Regina Eck, PharmD, BCPS, [place] Clinical Pharmacist, [place]  II  T [phone]

## 2022-12-12 NOTE — Patient Instructions (Signed)
Visit Information  Following are the goals we discussed today:  Current Barriers:  Unable to independently afford treatment regimen Unable to achieve control of hyperlipidemia  Suboptimal therapeutic regimen for hyperlipidemia  Pharmacist Clinical Goal(s):  patient will verbalize ability to afford treatment regimen achieve control of HLD as evidenced by LDL<70, <55 through collaboration with PharmD and provider.    Interventions: 1:1 collaboration with Janora Norlander, DO regarding development and update of comprehensive plan of care as evidenced by provider attestation and co-signature Inter-disciplinary care team collaboration (see longitudinal plan of care) Comprehensive medication review performed; medication list updated in electronic medical record  Hyperlipidemia:  New goal. Uncontrolled: current treatment:NONE--new start repatha in JAN 2024; follows with cardiology/neuro (history of 2 strokes, T2DM) Medications previously tried: rosuvastatin '5mg'$ , '20mg'$  (has tried alternative dosing, etc & reports rib pain and shortness of breath--2 separate trials) ; zetia not effective Lipid Panel     Component Value Date/Time   CHOL 223 (H) 07/20/2022 0529   CHOL 164 05/09/2022 1306   CHOL 242 (H) 03/02/2013 1606   TRIG 134 07/20/2022 0529   TRIG 229 (H) 04/15/2014 1207   TRIG 343 (H) 03/02/2013 1606   HDL 49 07/20/2022 0529   HDL 52 05/09/2022 1306   HDL 45 04/15/2014 1207   HDL 43 03/02/2013 1606   CHOLHDL 4.6 07/20/2022 0529   VLDL 27 07/20/2022 0529   LDLCALC 147 (H) 07/20/2022 0529   LDLCALC 70 05/09/2022 1306   LDLCALC 108 (H) 04/15/2014 1207   [place] 130 (H) 03/02/2013 9191   YOMAYOKHT 97 09/07/2019 1315   LABVLDL 42 (H) 05/09/2022 1306  Current dietary patterns: recommended heart healthy/mediterranean Recommended repatha--covered tier 3; G72 ICD for statin myopathy Assessed patient finances. Enrolled patient in [place] for 2024; will completed PA   Patient  Goals/Self-Care Activities patient will:  - take medications as prescribed as evidenced by patient report and record review collaborate with provider on medication access solutions engage in dietary modifications by  FOLLOWING A HEART HEALTHY DIET/HEALTHY PLATE METHOD   Plan: Telephone follow up appointment with care management team member scheduled for:  12/28/22  Signature Regina Eck, PharmD, BCPS, [place] Clinical Pharmacist, [place]  II  T [phone]   Please call the care guide team at [phone] if you need to cancel or reschedule your appointment.   The patient verbalized understanding of instructions, educational materials, and care plan provided today and DECLINED offer to receive copy of patient instructions, educational materials, and care plan.

## 2022-12-12 NOTE — Telephone Encounter (Signed)
  PA COMPLETED PLEASE FOLLOW DON'T COMPLETE DUPLICATE PA IN COVER MY MEDS

## 2022-12-13 ENCOUNTER — Ambulatory Visit (INDEPENDENT_AMBULATORY_CARE_PROVIDER_SITE_OTHER): Payer: PPO | Admitting: *Deleted

## 2022-12-13 VITALS — Ht 64.0 in | Wt 151.0 lb

## 2022-12-13 DIAGNOSIS — Z0000 Encounter for general adult medical examination without abnormal findings: Secondary | ICD-10-CM

## 2022-12-13 NOTE — Telephone Encounter (Signed)
PA approved.

## 2022-12-13 NOTE — Progress Notes (Signed)
I connected with  Lakea Mittelman Sherburn on 12/13/22 by a audio enabled telemedicine application and verified that I am speaking with the correct person using two identifiers.  Patient Location: Home  Provider Location: Office/Clinic  I discussed the limitations of evaluation and management by telemedicine. The patient expressed understanding and agreed to proceed.  Subjective:   ZAAKIRAH KISTNER is a 71 y.o. female who presents for Medicare Annual (Subsequent) preventive examination.  Review of Systems     Cardiac Risk Factors include: advanced age (>56mn, >>89women);diabetes mellitus;dyslipidemia;hypertension;sedentary lifestyle     Objective:    Today's Vitals   12/13/22 1033  Weight: 151 lb (68.5 kg)  Height: _0  (1.626 m)   Body mass index is 25.92 kg/m.     12/13/2022   10:39 AM 11/12/2022   12:53 PM 08/27/2022    4:15 PM 07/20/2022    6:00 PM 07/20/2022    9:13 AM 11/13/2021   11:36 AM 03/24/2020    2:47 PM  Advanced Directives  Does Patient Have a Medical Advance Directive? Yes Yes Yes No No Yes Yes  Type of Advance Directive Living will;Out of facility DNR (pink MOST or yellow form) [place]    [place] Living will  Does patient want to make changes to medical advance directive?  No - Patient declined     No - Patient declined  Copy of [place] Chart? No - copy requested No - copy requested    No - copy requested   Would patient like information on creating a medical advance directive?    No - Patient declined No - Patient declined      Current Medications (verified) Outpatient Encounter Medications as of 12/13/2022  Medication Sig   Blood Glucose Monitoring Suppl (ONE TOUCH ULTRA 2) w/Device KIT 1 Device by Does not apply route daily.   BYSTOLIC 5 MG tablet TAKE (1) TABLET TWICE A DAY.   calcium citrate-vitamin D (CITRACAL+D) 315-200 MG-UNIT per tablet Take 1 tablet by mouth daily.    clopidogrel (PLAVIX) 75 MG tablet Take 1 tablet (75 mg total) by mouth daily.   COLLAGEN PO Take 0.5 Scoops by mouth daily.   diclofenac Sodium (VOLTAREN) 1 % GEL Apply 4 g topically 4 (four) times daily. (Patient taking differently: Apply 4 g topically 2 (two) times daily as needed (pain).)   glucose blood (ONETOUCH ULTRA) test strip CHECK BLOOD SUGAR up to 4 times DAILY OR AS DIRECTED E11.9   metFORMIN (GLUCOPHAGE) 500 MG tablet Take 1 tablet (500 mg total) by mouth daily with supper.   OneTouch Delica Lancets 374QMISC CHECK BLOOD SUGAR up to 4 times DAILY OR AS DIRECTED.E11.9   pantoprazole (PROTONIX) 40 MG tablet Take 1 tablet (40 mg total) by mouth daily.   sertraline (ZOLOFT) 50 MG tablet TAKE 1 TABLET DAILY   carboxymethylcellulose (REFRESH PLUS) 0.5 % SOLN Place 1 drop into both eyes daily as needed (dry eyes). (Patient not taking: Reported on 12/13/2022)   Evolocumab (REPATHA SURECLICK) 1595MG/ML SOAJ Inject 140 mg into the skin every 14 (fourteen) days. (Patient not taking: Reported on 12/13/2022)   rosuvastatin (CRESTOR) 20 MG tablet Take 1 tablet (20 mg total) by mouth daily. (Patient not taking: Reported on 12/13/2022)   No facility-administered encounter medications on file as of 12/13/2022.    Allergies (verified) Glipizide and Pravachol [pravastatin sodium]   History: Past Medical History:  Diagnosis Date   Acute CVA (  cerebrovascular accident) ([place]) 07/20/2022   Anxiety    Arthritis    of the neck   Colon polyps    Diabetes mellitus without complication ([place])    Esophageal stricture    Essential hypertension 01/05/2020   GERD (gastroesophageal reflux disease)    Hiatal Hernia   History of uterine cancer    Hyperlipidemia    Hypertension    Occipital stroke ([place]) 02/13/2019   Osteoporosis    Pacemaker    Symptomatic bradycardia    Tingling in extremities 07/20/2022   Past Surgical History:  Procedure Laterality Date   APPENDECTOMY     CHOLECYSTECTOMY      LAPAROSCOPIC TOTAL HYSTERECTOMY     LYMPH NODE DISSECTION     bilateral pelvic and right-sided periaortic   PACEMAKER GENERATOR CHANGE N/A 05/21/2014   Procedure: PACEMAKER GENERATOR CHANGE;  Surgeon: Evans Lance, MD;  [facility] C[facility] LAB;  Service: Cardiovascular;  Laterality: N/A;   status post pacemaker     metronic [id number]   TRANSTHORACIC ECHOCARDIOGRAM  08/30/04   TUBAL LIGATION     Family History  Problem Relation Age of Onset   Heart disease Mother    Heart attack Mother    Heart disease Father    Heart attack Father    Cancer Sister        breast   Breast cancer Sister    COPD Sister    Alcohol abuse Brother    Healthy Daughter    Social History   Socioeconomic History   Marital status: Significant Other    Spouse name: Not on file   Number of children: 1   Years of education: GED   Highest education level: GED or equivalent  Occupational History   Occupation: retired    Comment: Engineer, manufacturing systems  Tobacco Use   Smoking status: Never   Smokeless tobacco: Never  Scientific laboratory technician Use: Never used  Substance and Sexual Activity   Alcohol use: No    Comment: may have a social drink rarely   Drug use: No   Sexual activity: Not Currently  Other Topics Concern   Not on file  Social History Narrative   Lives with sig other   Social Determinants of Health   Financial Resource Strain: Low Risk  (12/13/2022)   Overall Financial Resource Strain (CARDIA)    Difficulty of Paying Living Expenses: Not very hard  Food Insecurity: No Food Insecurity (12/13/2022)   Hunger Vital Sign    Worried About Running Out of Food in the Last Year: Never true    Ran Out of Food in the Last Year: Never true  Transportation Needs: No Transportation Needs (11/12/2022)   PRAPARE - Hydrologist (Medical): No    Lack of Transportation (Non-Medical): No  Physical Activity: Insufficiently Active (12/13/2022)   Exercise Vital Sign    Days of Exercise  per Week: 7 days    Minutes of Exercise per Session: 10 min  Stress: No Stress Concern Present (12/13/2022)   [place]    Feeling of Stress : Not at all  Social Connections: Moderately Integrated (12/13/2022)   Social Connection and Isolation Panel [NHANES]    Frequency of Communication with Friends and Family: More than three times a week    Frequency of Social Gatherings with Friends and Family: Once a week    Attends Religious Services: More than 4 times per year  Active Member of Clubs or Organizations: No    Attends Archivist Meetings: Never    Marital Status: Living with partner  Recent Concern: Social Connections - Moderately Isolated (11/12/2022)   Social Connection and Isolation Panel [NHANES]    Frequency of Communication with Friends and Family: More than three times a week    Frequency of Social Gatherings with Friends and Family: More than three times a week    Attends Religious Services: Never    Marine scientist or Organizations: No    Attends Music therapist: Never    Marital Status: Living with partner    Tobacco Counseling Counseling given: Not Answered   Clinical Intake:  Pre-visit preparation completed: Yes  Pain : No/denies pain     Nutritional Status: BMI 25 -29 Overweight Nutritional Risks: None Diabetes: Yes CBG done?: Yes CBG resulted in Enter/ Edit results?: No (149 patient reported) Did pt. bring in CBG monitor from home?: No  How often do you need to have someone help you when you read instructions, pamphlets, or other written materials from your doctor or pharmacy?: 1 - Never What is the last grade level you completed in school?: GED  Diabetic?Nutrition Risk Assessment:  Has the patient had any N/V/D within the last 2 months?  No  Does the patient have any non-healing wounds?  No  Has the patient had any unintentional weight loss or weight  gain?  No   Diabetes:  Is the patient diabetic?  Yes  If diabetic, was a CBG obtained today?  Yes  Did the patient bring in their glucometer from home?  Yes  How often do you monitor your CBG's? daily.   Financial Strains and Diabetes Management:  Are you having any financial strains with the device, your supplies or your medication? No .  Does the patient want to be seen by Chronic Care Management for management of their diabetes?  No  Would the patient like to be referred to a Nutritionist or for Diabetic Management?  No   Diabetic Exams:  Diabetic Eye Exam: Overdue for diabetic eye exam. Pt has been advised about the importance in completing this exam. Patient advised to call and schedule an eye exam. Diabetic Foot Exam: Overdue, Pt has been advised about the importance in completing this exam. Pt is scheduled for diabetic foot exam on n/a.   Interpreter Needed?: No  Information entered by :: Baldomero Lamy, LPN   Activities of Daily Living    12/13/2022   10:41 AM 11/12/2022   12:49 PM  In your present state of health, do you have any difficulty performing the following activities:  Hearing? 1 0  Comment wears hearing aides   Vision? 1 0  Comment wears glasses   Difficulty concentrating or making decisions? 1 0  Comment all three   Walking or climbing stairs? 1 0  Comment still a little off balance due to stroke   Dressing or bathing? 0 0  Doing errands, shopping? 0 0  Preparing Food and eating ? N N  Using the Toilet? N N  In the past six months, have you accidently leaked urine? Y N  Comment urgency   Do you have problems with loss of bowel control? N N  Managing your Medications? N N  Managing your Finances? Y N  Comment things are expensive   Housekeeping or managing your Housekeeping? N N    Patient Care Team: Janora Norlander, DO as PCP -  General (Family Medicine) Evans Lance, MD as Consulting Physician (Cardiology) Harlen Labs, MD as  Referring Physician (Optometry) Kassie Mends, RN as [place] any recent Medical Services you may have received from other than [facility] providers in the past year (date may be approximate).     Assessment:   This is a routine wellness examination for [place].  Hearing/Vision screen No results found.  Dietary issues and exercise activities discussed: Current Exercise Habits: Home exercise routine, Type of exercise: stretching;walking, Time (Minutes): 10, Frequency (Times/Week): 7, Weekly Exercise (Minutes/Week): 70, Intensity: Mild, Exercise limited by: None identified   Goals Addressed             This Visit's Progress    Patient Stated       12/13/2022 AWV Goal: Exercise for General Health  Patient will verbalize understanding of the benefits of increased physical activity: Exercising regularly is important. It will improve your overall fitness, flexibility, and endurance. Regular exercise also will improve your overall health. It can help you control your weight, reduce stress, and improve your bone density. Over the next year, patient will increase physical activity as tolerated with a goal of at least 150 minutes of moderate physical activity per week.  You can tell that you are exercising at a moderate intensity if your heart starts beating faster and you start breathing faster but can still hold a conversation. Moderate-intensity exercise ideas include: Walking 1 mile (1.6 km) in about 15 minutes Biking Hiking Golfing Dancing Water aerobics Patient will verbalize understanding of everyday activities that increase physical activity by providing examples like the following: Yard work, such as: Sales promotion account executive Gardening Washing windows or floors Patient will be able to explain general safety guidelines for exercising:  Before you start a new  exercise program, talk with your health care provider. Do not exercise so much that you hurt yourself, feel dizzy, or get very short of breath. Wear comfortable clothes and wear shoes with good support. Drink plenty of water while you exercise to prevent dehydration or heat stroke. Work out until your breathing and your heartbeat get faster.        Depression Screen    12/13/2022   10:41 AM 11/12/2022   12:49 PM 10/24/2022   11:08 AM 08/06/2022    3:02 PM 07/24/2022   12:45 PM 05/09/2022    1:42 PM 02/07/2022    1:00 PM  PHQ 2/9 Scores  PHQ - 2 Score 0 0 0 0 1 0 0  PHQ- 9 Score    _0 Fall Risk    12/13/2022   10:41 AM 11/12/2022    1:05 PM 10/24/2022   11:08 AM 09/10/2022    9:34 AM 08/06/2022    2:54 PM  Fall Risk   Falls in the past year? _1 Number falls in past yr: 0 0 1  0  Injury with Fall? 0 0 0  1  Risk for fall due to : History of fall(s)  History of fall(s)  History of fall(s)  Follow up Falls evaluation completed  Education provided  Falls evaluation completed    [place]:  Any stairs in or around the home? Yes  If so, are there any without handrails? Yes  Home free of loose throw rugs in walkways, pet  beds, electrical cords, etc? Yes  Adequate lighting in your home to reduce risk of falls? Yes   ASSISTIVE DEVICES UTILIZED TO PREVENT FALLS:  Life alert? No  Use of a cane, walker or w/c? Yes  Grab bars in the bathroom? No  Shower chair or bench in shower? Yes  Elevated toilet seat or a handicapped toilet? No   TIMED UP AND GO:  Was the test performed? No .   Cognitive Function:    08/02/2017    1:46 PM  MMSE - Mini Mental State Exam  Orientation to time 5  Orientation to Place 5  Registration 3  Attention/ Calculation 5  Recall 3  Language- name 2 objects 2  Language- repeat 1  Language- follow 3 step command 3  Language- read & follow direction 1  Write a sentence 1  Copy design 1  Total score  30        12/13/2022   10:45 AM 11/13/2021   11:25 AM 03/24/2020    2:56 PM 03/24/2020    2:42 PM  6CIT Screen  What Year? 0 points 0 points 0 points 0 points  What month? 0 points 0 points 0 points 0 points  What time? 0 points 0 points 0 points 0 points  Count back from 20 0 points 0 points 0 points   Months in reverse 0 points 0 points 0 points   Repeat phrase 0 points 4 points 0 points   Total Score 0 points 4 points 0 points     Immunizations Immunization History  Administered Date(s) Administered   Fluad Quad(high Dose 65+) 09/07/2019, 11/06/2021, 10/24/2022   Influenza,inj,Quad PF,6+ Mos 10/29/2016, 01/03/2018, 10/08/2018   Moderna SARS-COV2 Booster Vaccination 01/18/2021   Moderna Sars-Covid-2 Vaccination 02/25/2020, 03/29/2020   Pneumococcal Conjugate-13 08/02/2017   Pneumococcal Polysaccharide-23 08/04/2018   Tdap 10/30/2011   Zoster Recombinat (Shingrix) 02/07/2022, 05/09/2022    TDAP status: Due, Education has been provided regarding the importance of this vaccine. Advised may receive this vaccine at local pharmacy or Health Dept. Aware to provide a copy of the vaccination record if obtained from local pharmacy or Health Dept. Verbalized acceptance and understanding.  Flu Vaccine status: Up to date  Pneumococcal vaccine status: Up to date  Covid-19 vaccine status: Information provided on how to obtain vaccines.   Qualifies for Shingles Vaccine? Yes   Shingrix Completed?: Yes  Screening Tests Health Maintenance  Topic Date Due   DTaP/Tdap/Td (2 - Td or Tdap) 10/29/2021   FOOT EXAM  12/12/2021   OPHTHALMOLOGY EXAM  08/04/2022   COVID-19 Vaccine (4 - 2023-24 season) 08/17/2022   Medicare Annual Wellness (AWV)  11/13/2022   MAMMOGRAM  02/07/2023   HEMOGLOBIN A1C  04/24/2023   Diabetic kidney evaluation - eGFR measurement  07/25/2023   COLONOSCOPY (Pts 45-44yr Insurance coverage will need to be confirmed)  09/23/2023   Diabetic kidney evaluation - Urine ACR   10/25/2023   DEXA SCAN  10/24/2024   Pneumonia Vaccine 71 Years old  Completed   INFLUENZA VACCINE  Completed   Hepatitis C Screening  Completed   Zoster Vaccines- Shingrix  Completed   HPV VACCINES  Aged Out    Health Maintenance  Health Maintenance Due  Topic Date Due   DTaP/Tdap/Td (2 - Td or Tdap) 10/29/2021   FOOT EXAM  12/12/2021   OPHTHALMOLOGY EXAM  08/04/2022   COVID-19 Vaccine (4 - 2023-24 season) 08/17/2022   Medicare Annual Wellness (AWV)  11/13/2022    Colorectal cancer screening:  Type of screening: Colonoscopy. Completed 09/22/2018. Repeat every 5 years  Mammogram status: Completed 02/07/2022. Repeat every year  Bone Density status: Completed 10/24/22. Results reflect: Bone density results: OSTEOPENIA. Repeat every 2 years.  Lung Cancer Screening: (Low Dose CT Chest recommended if Age 16-80 years, 30 pack-year currently smoking OR have quit w/in 15years.) does not qualify.   Lung Cancer Screening Referral: no   Community Resource Referral / Chronic Care Management: CRR required this visit?  No   CCM required this visit?  No      Plan:     I have personally reviewed and noted the following in the patient's chart:   Medical and social history Use of alcohol, tobacco or illicit drugs  Current medications and supplements including opioid prescriptions. Patient is not currently taking opioid prescriptions. Functional ability and status Nutritional status Physical activity Advanced directives List of other physicians Hospitalizations, surgeries, and ER visits in previous 12 months Vitals Screenings to include cognitive, depression, and falls Referrals and appointments  In addition, I have reviewed and discussed with patient certain preventive protocols, quality metrics, and best practice recommendations. A written personalized care plan for preventive services as well as general preventive health recommendations were provided to patient.     Willette Pa, LPN   [id number]   Nurse Notes: AVS printed and mailed to patient

## 2022-12-13 NOTE — Telephone Encounter (Signed)
Just FYI!!  Patient approved for repatha and grant approved for 2024! She will pick up and bring to me for injection education next week! So exciting for cardiac benefit Appreciate PCP documentation and referral :)

## 2022-12-13 NOTE — Progress Notes (Signed)
[facility] [address] [place]. [place] [address] [phone]       STROKE FOLLOW UP NOTE  [name] Date of Birth:  12-10-1951 Medical Record Number:  [id number]   Reason for visit: Stroke follow up    SUBJECTIVE:   CHIEF COMPLAINT:  Chief Complaint  Patient presents with   Follow-up    RM 2 alone Pt is well and stable, still having some headaches and imbalance. No new concerns.     HPI:   Update 12/18/2022 JM: Patient returns for routine stroke follow-up unaccompanied.  Overall stable without new stroke/TIA symptoms.  Denies residual left foot weakness. Does have chronic imbalance which worsened since her stroke but has been gradually improving. Mentions occasional headaches a few times per month, will resolve after taking a nap, located back of head, at times can be frontal or generalized. Unable to describe characteristic. Denies photophobia or phonophobia or N/V.   Compliant on Plavix, Stopped Crestor due to myalgias, improved after stopping, Plans on starting Repatha today  Blood pressure well controlled  No further concerns at this time    History provided for reference purposes only Initial visit 08/16/2022 JM: Patient is being seen for initial hospital follow-up accompanied by her significant other.  Reports continued left foot weakness but has been gradually improving. At times will experience difficulty keeping a sandal or flip flop on her left foot.  She also notes some unsteadiness while walking.  She has not worked with therapy since discharge, but has been walking and exercising at home doing her own type of therapy.  She has had a couple headaches, resolved after use of topical ointment.  Denies new stroke/TIA symptoms.  Completed 3 weeks DAPT, remains on Plavix alone, denies side effects Remains on Crestor but feels it has been making her hips and upper back hurt which she previously experienced on Crestor.  She plans on further  discussing starting Repatha with PCP Blood pressure today 142/79  No further concerns at this time   Stroke admission 07/19/2022 [name] is a 71 y.o. female with history of GERD, DM2, HLD and HTN who presented on 07/19/2022 with acute onset weakness and heaviness of her left leg which began on Monday 7/31.  Personally reviewed hospitalization pertinent progress notes, lab work and imaging.  Evaluated by Dr. Leonie Man for likely right-sided small stroke not seen on CT (unable to obtain MRI due to pacer) etiology likely secondary to small vessel disease in setting of uncontrolled risk factors although unsure if presenting symptom of left leg pain and weakness more mechanical issue rather than right brain infarct.  CTA head/neck negative LVO or hemodynamically significant stenosis. CT head showed multiple old prior strokes as noted below. EF 60 to 65%.  LDL 147.  A1c 7.0.  On aspirin PTA and recommended DAPT for 3 weeks then Plavix alone as well as added Crestor 20 mg daily.  Therapies recommended home health PT/OT for residual subjective left foot weakness.    PERTINENT IMAGING  Per hospitalization 07/19/2022 CT head No acute abnormality. Old lacunar infarcts in left thalamus, left caudate lobe, right cerebellum and right centrum ovale.  Old infarct in left occipital region. CTA head & neck no LVO or hemodynamically significant stenosis, right thyroid nodule Follow up CT pending 2D Echo 60 to 65% BLE [place] negative LDL 147 HgbA1c 7.0    ROS:   14 system review of systems performed and negative with exception of those listed in HPI  PMH:  Past Medical History:  Diagnosis Date   Acute CVA (cerebrovascular accident) ([place]) 07/20/2022   Anxiety    Arthritis    of the neck   Colon polyps    Diabetes mellitus without complication ([place])    Esophageal stricture    Essential hypertension 01/05/2020   GERD (gastroesophageal reflux disease)    Hiatal Hernia   History of uterine cancer     Hyperlipidemia    Hypertension    Occipital stroke ([place]) 02/13/2019   Osteoporosis    Pacemaker    Symptomatic bradycardia    Tingling in extremities 07/20/2022    PSH:  Past Surgical History:  Procedure Laterality Date   APPENDECTOMY     CHOLECYSTECTOMY     LAPAROSCOPIC TOTAL HYSTERECTOMY     LYMPH NODE DISSECTION     bilateral pelvic and right-sided periaortic   PACEMAKER GENERATOR CHANGE N/A 05/21/2014   Procedure: PACEMAKER GENERATOR CHANGE;  Surgeon: Evans Lance, MD;  [facility]: [facility] CATH LAB;  Service: Cardiovascular;  Laterality: N/A;   status post pacemaker     metronic [id number]   TRANSTHORACIC ECHOCARDIOGRAM  08/30/04   TUBAL LIGATION      Social History:  Social History   Socioeconomic History   Marital status: Significant Other    Spouse name: Not on file   Number of children: 1   Years of education: GED   Highest education level: GED or equivalent  Occupational History   Occupation: retired    Comment: Engineer, manufacturing systems  Tobacco Use   Smoking status: Never   Smokeless tobacco: Never  Scientific laboratory technician Use: Never used  Substance and Sexual Activity   Alcohol use: No    Comment: may have a social drink rarely   Drug use: No   Sexual activity: Not Currently  Other Topics Concern   Not on file  Social History Narrative   Lives with sig other   Social Determinants of Health   Financial Resource Strain: Low Risk  (12/13/2022)   Overall Financial Resource Strain (CARDIA)    Difficulty of Paying Living Expenses: Not very hard  Food Insecurity: No Food Insecurity (12/13/2022)   Hunger Vital Sign    Worried About Running Out of Food in the Last Year: Never true    [place] in the Last Year: Never true  Transportation Needs: No Transportation Needs (11/12/2022)   PRAPARE - Hydrologist (Medical): No    Lack of Transportation (Non-Medical): No  Physical Activity: Insufficiently Active (12/13/2022)   Exercise Vital Sign     Days of Exercise per Week: 7 days    Minutes of Exercise per Session: 10 min  Stress: No Stress Concern Present (12/13/2022)   [place]    Feeling of Stress : Not at all  Social Connections: Moderately Integrated (12/13/2022)   Social Connection and Isolation Panel [NHANES]    Frequency of Communication with Friends and Family: More than three times a week    Frequency of Social Gatherings with Friends and Family: Once a week    Attends Religious Services: More than 4 times per year    Active Member of [employer] or Organizations: No    Attends Archivist Meetings: Never    Marital Status: Living with partner  Recent Concern: Social Connections - Moderately Isolated (11/12/2022)   Social Connection and Isolation Panel [NHANES]    Frequency of Communication with Friends  and Family: More than three times a week    Frequency of Social Gatherings with Friends and Family: More than three times a week    Attends Religious Services: Never    Marine scientist or Organizations: No    Attends Archivist Meetings: Never    Marital Status: Living with partner  Intimate Partner Violence: Not At Risk (12/13/2022)   Humiliation, Afraid, Rape, and Kick questionnaire    Fear of Current or Ex-Partner: No    Emotionally Abused: No    Physically Abused: No    Sexually Abused: No    Family History:  Family History  Problem Relation Age of Onset   Heart disease Mother    Heart attack Mother    Heart disease Father    Heart attack Father    Cancer Sister        breast   Breast cancer Sister    COPD Sister    Alcohol abuse Brother    Healthy Daughter     Medications:   Current Outpatient Medications on File Prior to Visit  Medication Sig Dispense Refill   Blood Glucose Monitoring Suppl (ONE TOUCH ULTRA 2) w/Device KIT 1 Device by Does not apply route daily. 1 each 0   BYSTOLIC 5 MG tablet TAKE (1)  TABLET TWICE A DAY. 180 tablet 3   calcium citrate-vitamin D (CITRACAL+D) 315-200 MG-UNIT per tablet Take 1 tablet by mouth daily.     carboxymethylcellulose (REFRESH PLUS) 0.5 % SOLN Place 1 drop into both eyes daily as needed (dry eyes).     clopidogrel (PLAVIX) 75 MG tablet Take 1 tablet (75 mg total) by mouth daily. 90 tablet 3   COLLAGEN PO Take 0.5 Scoops by mouth daily.     diclofenac Sodium (VOLTAREN) 1 % GEL Apply 4 g topically 4 (four) times daily. (Patient taking differently: Apply 4 g topically 2 (two) times daily as needed (pain).) 400 g PRN   Evolocumab (REPATHA SURECLICK) 696 MG/ML SOAJ Inject 140 mg into the skin every 14 (fourteen) days. 2 mL 5   glucose blood (ONETOUCH ULTRA) test strip CHECK BLOOD SUGAR up to 4 times DAILY OR AS DIRECTED E11.9 100 strip 2   metFORMIN (GLUCOPHAGE) 500 MG tablet Take 1 tablet (500 mg total) by mouth daily with supper. 90 tablet 3   OneTouch Delica Lancets 29B MISC CHECK BLOOD SUGAR up to 4 times DAILY OR AS DIRECTED.E11.9 100 each 2   pantoprazole (PROTONIX) 40 MG tablet Take 1 tablet (40 mg total) by mouth daily. 90 tablet 3   sertraline (ZOLOFT) 50 MG tablet TAKE 1 TABLET DAILY 90 tablet 3   No current facility-administered medications on file prior to visit.    Allergies:   Allergies  Allergen Reactions   Glipizide Itching   Pravachol [Pravastatin Sodium] Other (See Comments)    Stomach pain and constipation       OBJECTIVE:  Physical Exam  Vitals:   12/18/22 1046  BP: 136/80  Pulse: 61  Weight: 146 lb (66.2 kg)  Height: _0  (1.626 m)   Body mass index is 25.06 kg/m. No results found.  General: well developed, well nourished, very pleasant elderly Caucasian female, seated, in no evident distress Head: head normocephalic and atraumatic.   Neck: supple with no carotid or supraclavicular bruits Cardiovascular: regular rate and rhythm, no murmurs Musculoskeletal: no deformity Skin:  no rash/petichiae Vascular:  Normal  pulses all extremities   Neurologic Exam Mental Status: Awake and fully  alert.  Fluent speech and language.  Oriented to place and time. Recent and remote memory intact. Attention span, concentration and fund of knowledge appropriate. Mood and affect appropriate.  Cranial Nerves: Pupils equal, briskly reactive to light. Extraocular movements full without nystagmus. Visual fields full to confrontation. Hearing intact. Facial sensation intact. Face, tongue, palate moves normally and symmetrically.  Motor: Normal bulk and tone. Normal strength in all tested extremity muscles  Sensory.: intact to touch , pinprick , position and vibratory sensation  Coordination: Rapid alternating movements normal in all extremities. Finger-to-nose and heel-to-shin performed accurately bilaterally. Gait and Station: Arises from chair without difficulty. Stance is normal. Gait demonstrates mild unsteadiness/imbalance without use of assistive device.  Reflexes: 1+ and symmetric. Toes downgoing.        ASSESSMENT: [name] is a 71 y.o. year old female with suspected right-sided small stroke not seen on CT (unable to obtain MRI due to pacer) imn 07/2022 likely secondary to small vessel disease in setting of uncontrolled risk factors. Vascular risk factors include HTN, HLD, DM, advanced age and multiple prior strokes.     PLAN:  Suspected right brain stroke:  Residual deficit: mild imbalance, improved since prior visit  Continue Plavix and start Repatha (recently ordered by PCP) for secondary stroke prevention.  Discussed secondary stroke prevention measures and importance of close PCP follow up for aggressive stroke risk factor management including BP goal<130/90, HLD with LDL goal<70 and DM with A1c.<7 .  I have gone over the pathophysiology of stroke, warning signs and symptoms, risk factors and their management in some detail with instructions to go to the closest emergency room for symptoms of  concern. Tension type headaches: occurs a few times per month, has been present since 07/2022. Denies migrainous type features. Can use Tylenol as needed for rescue.  No indication for prophylactic therapy at this time.  Continue to monitor.  Advised to call if worsening.     Doing well from stroke standpoint without further recommendations and risk factors are managed by PCP. She may follow up PRN, as usual for our patients who are strictly being followed for stroke. If any new neurological issues should arise, request PCP place referral for evaluation by one of our neurologists. Thank you.     CC:  PCP: Janora Norlander, DO    I spent 32 minutes of face-to-face and non-face-to-face time with patient.  This included previsit chart review, lab review, study review, order entry, electronic health record documentation, patient education and discussion regarding above diagnoses and treatment plan and answered all the questions to patient's satisfaction   Frann Rider, [facility]  [facility] [address] [place], [place] [address]  Phone [phone] Fax [phone] Note: This document was prepared with digital dictation and possible smart phrase technology. Any transcriptional errors that result from this process are unintentional.

## 2022-12-13 NOTE — Patient Instructions (Addendum)
  [name] , Thank you for taking time to come for your Medicare Wellness Visit. I appreciate your ongoing commitment to your health goals. Please review the following plan we discussed and let me know if I can assist you in the future.   These are the goals we discussed:  Goals   12/13/2022 AWV Goal: Exercise for General Health   Patient will verbalize understanding of the benefits of increased physical activity: Exercising regularly is important. It will improve your overall fitness, flexibility, and endurance. Regular exercise also will improve your overall health. It can help you control your weight, reduce stress, and improve your bone density. Over the next year, patient will increase physical activity as tolerated with a goal of at least 150 minutes of moderate physical activity per week.  You can tell that you are exercising at a moderate intensity if your heart starts beating faster and you start breathing faster but can still hold a conversation. Moderate-intensity exercise ideas include: Walking 1 mile (1.6 km) in about 15 minutes Biking Hiking Golfing Dancing Water aerobics Patient will verbalize understanding of everyday activities that increase physical activity by providing examples like the following: Yard work, such as: Sales promotion account executive Gardening Washing windows or floors Patient will be able to explain general safety guidelines for exercising:  Before you start a new exercise program, talk with your health care provider. Do not exercise so much that you hurt yourself, feel dizzy, or get very short of breath. Wear comfortable clothes and wear shoes with good support. Drink plenty of water while you exercise to prevent dehydration or heat stroke. Work out until your breathing and your heartbeat get faster.        This is a list of the screening recommended for you and due dates:   Health Maintenance  Topic Date Due   DTaP/Tdap/Td vaccine (2 - Td or Tdap) 10/29/2021   Complete foot exam   12/12/2021   Eye exam for diabetics  08/04/2022   COVID-19 Vaccine (4 - 2023-24 season) 08/17/2022   Mammogram  02/07/2023   Hemoglobin A1C  04/24/2023   Yearly kidney function blood test for diabetes  07/25/2023   Colon Cancer Screening  09/23/2023   Yearly kidney health urinalysis for diabetes  10/25/2023   Medicare Annual Wellness Visit  12/14/2023   DEXA scan (bone density measurement)  10/24/2024   Pneumonia Vaccine  Completed   Flu Shot  Completed   Hepatitis C Screening: USPSTF Recommendation to screen - Ages 34-79 yo.  Completed   Zoster (Shingles) Vaccine  Completed   HPV Vaccine  Aged Out

## 2022-12-14 NOTE — Progress Notes (Signed)
Remote pacemaker transmission.   

## 2022-12-16 DIAGNOSIS — E785 Hyperlipidemia, unspecified: Secondary | ICD-10-CM

## 2022-12-16 DIAGNOSIS — E1169 Type 2 diabetes mellitus with other specified complication: Secondary | ICD-10-CM

## 2022-12-18 ENCOUNTER — Encounter: Payer: Self-pay | Admitting: Adult Health

## 2022-12-18 ENCOUNTER — Ambulatory Visit: Payer: PPO | Admitting: Adult Health

## 2022-12-18 VITALS — BP 136/80 | HR 61 | Ht 64.0 in | Wt 146.0 lb

## 2022-12-18 DIAGNOSIS — G44229 Chronic tension-type headache, not intractable: Secondary | ICD-10-CM | POA: Diagnosis not present

## 2022-12-18 DIAGNOSIS — I639 Cerebral infarction, unspecified: Secondary | ICD-10-CM

## 2022-12-18 DIAGNOSIS — Z8673 Personal history of transient ischemic attack (TIA), and cerebral infarction without residual deficits: Secondary | ICD-10-CM | POA: Diagnosis not present

## 2022-12-18 NOTE — Patient Instructions (Signed)
Continue clopidogrel 75 mg daily  and start Repatha  for secondary stroke prevention  Please continue to monitor your headaches, if these should worsen please let us know   Continue to follow up with PCP regarding cholesterol, diabetes and blood pressure management  Maintain strict control of hypertension with blood pressure goal below 130/90, diabetes with hemoglobin A1c goal below 7.0 % and cholesterol with LDL cholesterol (bad cholesterol) goal below 70 mg/dL.   Signs of a Stroke? Follow the BEFAST method:  Balance Watch for a sudden loss of balance, trouble with coordination or vertigo Eyes Is there a sudden loss of vision in one or both eyes? Or double vision?  Face: Ask the person to smile. Does one side of the face droop or is it numb?  Arms: Ask the person to raise both arms. Does one arm drift downward? Is there weakness or numbness of a leg? Speech: Ask the person to repeat a simple phrase. Does the speech sound slurred/strange? Is the person confused ? Time: If you observe any of these signs, call [phone].       Thank you for coming to see [place] at [facility]. I hope we have been able to provide you high quality care today.  You may receive a patient satisfaction survey over the next few weeks. We would appreciate your feedback and comments so that we may continue to improve ourselves and the health of our patients.

## 2022-12-28 ENCOUNTER — Ambulatory Visit (INDEPENDENT_AMBULATORY_CARE_PROVIDER_SITE_OTHER): Payer: PPO | Admitting: Pharmacist

## 2022-12-28 DIAGNOSIS — E1169 Type 2 diabetes mellitus with other specified complication: Secondary | ICD-10-CM

## 2022-12-28 DIAGNOSIS — E119 Type 2 diabetes mellitus without complications: Secondary | ICD-10-CM

## 2023-01-01 NOTE — Progress Notes (Signed)
Chronic Care Management Pharmacy Note  12/28/2022 Name:  [name] MRN:  [id number] DOB:  [birth year]  Summary:  Hyperlipidemia:  New goal. Uncontrolled: current treatment: starting Repatha tomorrow; follows with cardiology/neuro (history of 2 strokes, T2DM) Medications previously tried: rosuvastatin '5mg'$ , '20mg'$  (has tried alternative dosing, etc & reports rib pain and shortness of breath--2 separate trials) ; zetia not effective Lipid Panel     Component Value Date/Time   CHOL 223 (H) 07/20/2022 0529   CHOL 164 05/09/2022 1306   CHOL 242 (H) 03/02/2013 1606   TRIG 134 07/20/2022 0529   TRIG 229 (H) 04/15/2014 1207   TRIG 343 (H) 03/02/2013 1606   HDL 49 07/20/2022 0529   HDL 52 05/09/2022 1306   HDL 45 04/15/2014 1207   HDL 43 03/02/2013 1606   CHOLHDL 4.6 07/20/2022 0529   VLDL 27 07/20/2022 0529   LDLCALC 147 (H) 07/20/2022 0529   LDLCALC 70 05/09/2022 1306   LDLCALC 108 (H) 04/15/2014 1207   LDLCALC 130 (H) 03/02/2013 1606   LDLDIRECT 67 09/07/2019 1315   LABVLDL 42 (H) 05/09/2022 1306  Current dietary patterns: recommended heart healthy/mediterranean Recommended repatha--covered tier 3; G72 ICD for statin myopathy; reviewed autoinjector technique with patient Assessed patient finances. Enrolled patient in [place] for 2024; PA completed; mailed copay grant card to pharmacy   Patient Goals/Self-Care Activities patient will:  - take medications as prescribed as evidenced by patient report and record review collaborate with provider on medication access solutions engage in dietary modifications by  FOLLOWING A HEART HEALTHY DIET/HEALTHY PLATE METHOD   Subjective: [name] is an 72 y.o. year old female who is a primary patient of Janora Norlander, DO.  The patient was referred to the Chronic Care Management team for assistance with care management needs subsequent to provider initiation of CCM services and plan of care.    Engaged with patient by  telephone for follow up visit in response to provider referral for CCM services.   Objective:  LABS:   Lab Results  Component Value Date   CREATININE 0.83 07/24/2022   CREATININE 0.74 07/20/2022   CREATININE 0.74 07/19/2022     Lab Results  Component Value Date   HGBA1C 6.4 (H) 10/24/2022         Component Value Date/Time   CHOL 223 (H) 07/20/2022 0529   CHOL 164 05/09/2022 1306   CHOL 242 (H) 03/02/2013 1606   TRIG 134 07/20/2022 0529   TRIG 229 (H) 04/15/2014 1207   TRIG 343 (H) 03/02/2013 1606   HDL 49 07/20/2022 0529   HDL 52 05/09/2022 1306   HDL 45 04/15/2014 1207   HDL 43 03/02/2013 1606   CHOLHDL 4.6 07/20/2022 0529   VLDL 27 07/20/2022 0529   LDLCALC 147 (H) 07/20/2022 0529   LDLCALC 70 05/09/2022 1306   LDLCALC 108 (H) 04/15/2014 1207   LDLCALC 130 (H) 03/02/2013 1606   LDLDIRECT 67 09/07/2019 1315     Clinical ASCVD: Yes   The ASCVD Risk score (Arnett DK, et al., 2019) failed to calculate for the following reasons:   The patient has a prior MI or stroke diagnosis    Other: (CHADS2VASc if Afib, PHQ9 if depression, MMRC or CAT for COPD, ACT, DEXA)    BP Readings from Last 3 Encounters:  12/18/22 136/80  10/24/22 131/76  09/10/22 139/88      SDOH:  (Social Determinants of Health) assessments and interventions performed:    Allergies  Allergen Reactions  Glipizide Itching   Pravachol [Pravastatin Sodium] Other (See Comments)    Stomach pain and constipation     Medications Reviewed Today     Reviewed by Lavera Guise, [facility] (Pharmacist) on 01/01/23 at 1218  Med List Status: <None>   Medication Order Taking? Sig Documenting Provider Last Dose Status Informant  Blood Glucose Monitoring Suppl (ONE TOUCH ULTRA 2) w/Device KIT [id number] No 1 Device by Does not apply route daily. Eustaquio Maize, MD Taking Active Self  BYSTOLIC 5 MG tablet [id number] No TAKE (1) TABLET TWICE A DAY. Evans Lance, MD Taking Active Self  calcium citrate-vitamin D  (CITRACAL+D) 315-200 MG-UNIT per tablet [id number] No Take 1 tablet by mouth daily. [provider] Taking Active Self  carboxymethylcellulose (REFRESH PLUS) 0.5 % SOLN [id number] No Place 1 drop into both eyes daily as needed (dry eyes). [provider] Taking Active Self  clopidogrel (PLAVIX) 75 MG tablet [id number] No Take 1 tablet (75 mg total) by mouth daily. Janora Norlander, DO Taking Active   COLLAGEN PO [id number] No Take 0.5 Scoops by mouth daily. [provider] Taking Active Self  diclofenac Sodium (VOLTAREN) 1 % GEL [id number] No Apply 4 g topically 4 (four) times daily.  Patient taking differently: Apply 4 g topically 2 (two) times daily as needed (pain).   Janora Norlander, DO Taking Active Self  Evolocumab (REPATHA SURECLICK) 956 MG/ML SOAJ [id number] No Inject 140 mg into the skin every 14 (fourteen) days. Ronnie Doss M, DO Taking Active   glucose blood (ONETOUCH ULTRA) test strip [id number] No CHECK BLOOD SUGAR up to 4 times DAILY OR AS DIRECTED E11.9 Ronnie Doss M, DO Taking Active   metFORMIN (GLUCOPHAGE) 500 MG tablet [id number] No Take 1 tablet (500 mg total) by mouth daily with supper. Janora Norlander, DO Taking Active   OneTouch Delica Lancets 93A MISC [id number] No CHECK BLOOD SUGAR up to 4 times DAILY OR AS DIRECTED.E11.9 Ronnie Doss M, DO Taking Active   pantoprazole (PROTONIX) 40 MG tablet [id number] No Take 1 tablet (40 mg total) by mouth daily. Ronnie Doss M, DO Taking Active   sertraline (ZOLOFT) 50 MG tablet [id number] No TAKE 1 TABLET DAILY Ronnie Doss M, DO Taking Active               Goals Addressed               This Visit's Progress     Patient Stated     HLD PHARMD GOAL (pt-stated)        Current Barriers:  Unable to independently afford treatment regimen Unable to achieve control of hyperlipidemia  Suboptimal therapeutic regimen for hyperlipidemia  Pharmacist Clinical Goal(s):  patient  will verbalize ability to afford treatment regimen achieve control of HLD as evidenced by LDL<70, <55 through collaboration with PharmD and provider.    Interventions: 1:1 collaboration with Janora Norlander, DO regarding development and update of comprehensive plan of care as evidenced by provider attestation and co-signature Inter-disciplinary care team collaboration (see longitudinal plan of care) Comprehensive medication review performed; medication list updated in electronic medical record  Hyperlipidemia:  New goal. Uncontrolled: current treatment: starting Repatha tomorrow; follows with cardiology/neuro (history of 2 strokes, T2DM) Medications previously tried: rosuvastatin '5mg'$ , '20mg'$  (has tried alternative dosing, etc & reports rib pain and shortness of breath--2 separate trials) ; zetia not effective Lipid Panel     Component Value Date/Time   CHOL 223 (H) 07/20/2022 0529   CHOL 164 05/09/2022  1306   CHOL 242 (H) 03/02/2013 1606   TRIG 134 07/20/2022 0529   TRIG 229 (H) 04/15/2014 1207   TRIG 343 (H) 03/02/2013 1606   HDL 49 07/20/2022 0529   HDL 52 05/09/2022 1306   HDL 45 04/15/2014 1207   HDL 43 03/02/2013 1606   CHOLHDL 4.6 07/20/2022 0529   VLDL 27 07/20/2022 0529   LDLCALC 147 (H) 07/20/2022 0529   LDLCALC 70 05/09/2022 1306   LDLCALC 108 (H) 04/15/2014 1207   LDLCALC 130 (H) 03/02/2013 1606   LDLDIRECT 67 09/07/2019 1315   LABVLDL 42 (H) 05/09/2022 1306  Current dietary patterns: recommended heart healthy/mediterranean Recommended repatha--covered tier 3; G72 ICD for statin myopathy; reviewed autoinjector technique with patient Assessed patient finances. Enrolled patient in [place] for 2024; PA completed; mailed copay grant card to pharmacy   Patient Goals/Self-Care Activities patient will:  - take medications as prescribed as evidenced by patient report and record review collaborate with provider on medication access solutions engage in dietary  modifications by  [place] Dattero Denetria Luevanos, PharmD, BCPS, BCACP Clinical Pharmacist, [place]  II  T [phone]

## 2023-01-01 NOTE — Patient Instructions (Signed)
Visit Information  Following are the goals we discussed today:  Current Barriers:  Unable to independently afford treatment regimen Unable to achieve control of hyperlipidemia  Suboptimal therapeutic regimen for hyperlipidemia  Pharmacist Clinical Goal(s):  patient will verbalize ability to afford treatment regimen achieve control of HLD as evidenced by LDL<70, <55 through collaboration with PharmD and provider.    Interventions: 1:1 collaboration with Janora Norlander, DO regarding development and update of comprehensive plan of care as evidenced by provider attestation and co-signature Inter-disciplinary care team collaboration (see longitudinal plan of care) Comprehensive medication review performed; medication list updated in electronic medical record  Hyperlipidemia:  New goal. Uncontrolled: current treatment: starting Repatha tomorrow; follows with cardiology/neuro (history of 2 strokes, T2DM) Medications previously tried: rosuvastatin '5mg'$ , '20mg'$  (has tried alternative dosing, etc & reports rib pain and shortness of breath--2 separate trials) ; zetia not effective Lipid Panel     Component Value Date/Time   CHOL 223 (H) 07/20/2022 0529   CHOL 164 05/09/2022 1306   CHOL 242 (H) 03/02/2013 1606   TRIG 134 07/20/2022 0529   TRIG 229 (H) 04/15/2014 1207   TRIG 343 (H) 03/02/2013 1606   HDL 49 07/20/2022 0529   HDL 52 05/09/2022 1306   HDL 45 04/15/2014 1207   HDL 43 03/02/2013 1606   CHOLHDL 4.6 07/20/2022 0529   VLDL 27 07/20/2022 0529   LDLCALC 147 (H) 07/20/2022 0529   LDLCALC 70 05/09/2022 1306   LDLCALC 108 (H) 04/15/2014 1207   [place] 130 (H) 03/02/2013 9628   ZMOQHUTML 46 09/07/2019 1315   LABVLDL 42 (H) 05/09/2022 1306  Current dietary patterns: recommended heart healthy/mediterranean Recommended repatha--covered tier 3; G72 ICD for statin myopathy; reviewed autoinjector technique with patient Assessed patient finances. Enrolled patient in [place] for  2024; PA completed; mailed copay grant card to pharmacy   Patient Goals/Self-Care Activities patient will:  - take medications as prescribed as evidenced by patient report and record review collaborate with provider on medication access solutions engage in dietary modifications by  [place] Dattero Immanuel Fedak, PharmD, BCPS, BCACP Clinical Pharmacist, [place]  II  T [phone]   Please call the care guide team at [phone] if you need to cancel or reschedule your appointment.   The patient verbalized understanding of instructions, educational materials, and care plan provided today and DECLINED offer to receive copy of patient instructions, educational materials, and care plan.

## 2023-01-08 ENCOUNTER — Ambulatory Visit: Payer: Self-pay | Admitting: *Deleted

## 2023-01-08 ENCOUNTER — Ambulatory Visit: Payer: PPO | Admitting: *Deleted

## 2023-01-08 DIAGNOSIS — E119 Type 2 diabetes mellitus without complications: Secondary | ICD-10-CM

## 2023-01-08 DIAGNOSIS — I152 Hypertension secondary to endocrine disorders: Secondary | ICD-10-CM

## 2023-01-08 NOTE — Patient Instructions (Signed)
Please call the care guide team at [phone] if you need to cancel or reschedule your appointment.   If you are experiencing a Mental Health or [place] or need someone to talk to, please call the Suicide and Crisis Lifeline: 988 call the [place] National Suicide Prevention Lifeline: 3406171934 or TTY: 440-479-1775 TTY [phone]) to talk to a trained counselor call 1-800-273-TALK (toll free, 24 hour hotline) go to [facility] [address] [place] [phone]) call the [facility]: [phone] call 911   Following is a copy of the CCM Program Consent:  CCM service includes personalized support from designated clinical staff supervised by the physician, including individualized plan of care and coordination with other care providers 24/7 contact phone numbers for assistance for urgent and routine care needs. Service will only be billed when office clinical staff spend 20 minutes or more in a month to coordinate care. Only one practitioner may furnish and bill the service in a calendar month. The patient may stop CCM services at amy time (effective at the end of the month) by phone call to the office staff. The patient will be responsible for cost sharing (co-pay) or up to 20% of the service fee (after annual deductible is met)  Following is a copy of your full provider care plan:   Goals Addressed             This Visit's Progress    CCM (DIABETES) EXPECTED OUTCOME:  MONITOR, SELF-MANAGE AND REDUCE SYMPTOMS OF DIABETES       Current Barriers:  Knowledge Deficits related to Diabetes management Chronic Disease Management support and education needs related to Diabetes and diet Pt reports she checks CBG 1-2 x per day, fasting ranges 132-146, does not have any random readings to report Pt reports she drinks diet soft drinks (maybe one per day) and water Pt does light exercise such as walking to the park  near her house and exercises prescribed by PT Patient reports she has chronic yeast infections and feels as though she has another infection, has not used monistat as she has done in the past, states sometimes it is another type of infection requiring antibiotics RN care manager received message from primary care provider, okay for pt to use monistat and if not better, make appointment to be seen.  Planned Interventions: Reviewed medications with patient and discussed importance of medication adherence;        Reviewed prescribed diet with patient carbohydrate modified; Counseled on importance of regular laboratory monitoring as prescribed;        Reviewed scheduled/upcoming provider appointments including: follow up with primary care provider in March;         Advised patient, providing education and rationale, to check cbg twice daily and record        Review of patient status, including review of consultants reports, relevant laboratory and other test results, and medications completed;       Advised patient to discuss any infections, change in health status with provider;      Reinforced importance of exercising Reviewed correlation of elevated blood sugar with infections and not healing RN care manager spoke with pt and informed her to try the monistat, if no better, call and make appointment to be seen by primary care provider, pt verbalizes understanding  Symptom Management: Take medications as prescribed   Attend all scheduled provider appointments Call pharmacy for medication refills 3-7 days in advance of running out of medications  Perform all self care activities independently  Perform IADL's (shopping, preparing meals, housekeeping, managing finances) independently Call provider office for new concerns or questions  check blood sugar at prescribed times: twice daily check feet daily for cuts, sores or redness enter blood sugar readings and medication or insulin into daily  log take the blood sugar log to all doctor visits take the blood sugar meter to all doctor visits trim toenails straight across drink 6 to 8 glasses of water each day fill half of plate with vegetables limit fast food meals to no more than 1 per week manage portion size read food labels for fat, fiber, carbohydrates and portion size wash and dry feet carefully every day Continue walking to the park and doing exercises given to you by PT You may use monistat for symptoms of yeast infection, if this does not help, please call and make appointment to be seen by primary care provider  Follow Up Plan: Telephone follow up appointment with care management team member scheduled for:  02/25/23 at 1045 am          Patient verbalizes understanding of instructions and care plan provided today and agrees to view in [place]. Active MyChart status and patient understanding of how to access instructions and care plan via MyChart confirmed with patient.     Telephone follow up appointment with care management team member scheduled for:  02/25/23 at 1045 am

## 2023-01-08 NOTE — Patient Instructions (Signed)
Please call the care guide team at [phone] if you need to cancel or reschedule your appointment.   If you are experiencing a Mental Health or [place] or need someone to talk to, please call the Suicide and Crisis Lifeline: 988 call the [place] National Suicide Prevention Lifeline: 970-460-8189 or TTY: (463) 258-3488 TTY 364-877-3569) to talk to a trained counselor call 1-800-273-TALK (toll free, 24 hour hotline) go to [facility] [address], [place] [phone]) call the [facility]): [phone] call 911   Following is a copy of the CCM Program Consent:  CCM service includes personalized support from designated clinical staff supervised by the physician, including individualized plan of care and coordination with other care providers 24/7 contact phone numbers for assistance for urgent and routine care needs. Service will only be billed when office clinical staff spend 20 minutes or more in a month to coordinate care. Only one practitioner may furnish and bill the service in a calendar month. The patient may stop CCM services at amy time (effective at the end of the month) by phone call to the office staff. The patient will be responsible for cost sharing (co-pay) or up to 20% of the service fee (after annual deductible is met)  Following is a copy of your full provider care plan:   Goals Addressed             This Visit's Progress    CCM (DIABETES) EXPECTED OUTCOME:  MONITOR, SELF-MANAGE AND REDUCE SYMPTOMS OF DIABETES       Current Barriers:  Knowledge Deficits related to Diabetes management Chronic Disease Management support and education needs related to Diabetes and diet Pt reports she checks CBG 1-2 x per day, fasting ranges 132-146, does not have any random readings to report Pt reports she drinks diet soft drinks (maybe one per day) and water Pt does light exercise such as walking to the park  near her house and exercises prescribed by PT Patient reports she has chronic yeast infections and feels as though she has another infection, has not used monistat as she has done in the past, states sometimes it is another type of infection requiring antibiotics  Planned Interventions: Reviewed medications with patient and discussed importance of medication adherence;        Reviewed prescribed diet with patient carbohydrate modified; Counseled on importance of regular laboratory monitoring as prescribed;        Reviewed scheduled/upcoming provider appointments including: follow up with primary care provider in March;         Advised patient, providing education and rationale, to check cbg twice daily and record        Review of patient status, including review of consultants reports, relevant laboratory and other test results, and medications completed;       Advised patient to discuss any infections, change in health status with provider;      Reinforced importance of exercising Reviewed correlation of elevated blood sugar with infections and not healing In Basket message sent to primary care provider reporting pt feels she has yeast infection, ask to advise does pt need to try monistat first and/ or make appointment to be seen  Symptom Management: Take medications as prescribed   Attend all scheduled provider appointments Call pharmacy for medication refills 3-7 days in advance of running out of medications Perform all self care activities independently  Perform IADL's (shopping, preparing meals, housekeeping, managing finances) independently Call provider office for new concerns  or questions  check blood sugar at prescribed times: twice daily check feet daily for cuts, sores or redness enter blood sugar readings and medication or insulin into daily log take the blood sugar log to all doctor visits take the blood sugar meter to all doctor visits trim toenails straight across drink 6  to 8 glasses of water each day fill half of plate with vegetables limit fast food meals to no more than 1 per week manage portion size read food labels for fat, fiber, carbohydrates and portion size wash and dry feet carefully every day Continue walking to the park and doing exercises given to you by PT Symptoms of yeast infection reported to your primary care doctor, please follow up  Follow Up Plan: Telephone follow up appointment with care management team member scheduled for:  02/25/23 at 1045 am       CCM (HYPERTENSION) EXPECTED OUTCOME: MONITOR, SELF-MANAGE AND REDUCE SYMPTOMS OF HYPERTENSION       Current Barriers:  Knowledge Deficits related to Hypertension management Chronic Disease Management support and education needs related to Hypertension, diet Pt reports she lives alone but has boyfriend that "comes in and out frequently", pt reports she continues to drive, is independent in all aspect of her care. 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)   Planned Interventions: Evaluation of current treatment plan related to hypertension self management and patient's adherence to plan as established by provider;   Provided education to patient re: stroke prevention, s/s of heart attack and stroke; Reviewed prescribed diet low sodium Reviewed medications with patient and discussed importance of compliance;  Counseled on the importance of exercise goals with target of 150 minutes per week Discussed plans with patient for ongoing care management follow up and provided patient with direct contact information for care management team; Advised patient, providing education and rationale, to monitor blood pressure daily and record, calling PCP for findings outside established parameters;   Reviewed importance of adherence to low sodium diet  Symptom Management: Take medications as prescribed   Attend all scheduled provider appointments Call pharmacy for medication refills 3-7 days in advance of running out of medications Perform all self care activities independently  Perform IADL's (shopping, preparing meals, housekeeping, managing finances) independently Call provider office for new concerns or questions  check blood pressure daily write blood pressure results in a log or diary learn about high blood pressure keep a blood pressure log take blood pressure log to all doctor appointments call doctor for signs and symptoms of high blood pressure keep all doctor appointments take medications for blood pressure exactly as prescribed begin an exercise program report new symptoms to your doctor eat more whole grains, fruits and vegetables, lean meats and healthy fats Follow low sodium diet- read food labels, limit fast food  Follow Up Plan: Telephone follow up appointment with care management team member scheduled for:  02/25/23 at 1045 am          Patient verbalizes understanding of instructions and care plan provided today and agrees to view in [place]. Active MyChart status and patient understanding of how to access instructions and care plan via MyChart confirmed with patient.     Telephone follow up appointment with care management team member scheduled for:  02/25/23 at 1045 am  Vaginal Yeast  Infection, Adult  Vaginal yeast infection is a condition that causes vaginal discharge as well as soreness, swelling, and redness (inflammation) of the vagina. This is a common condition. Some women get this infection frequently. What are the causes? This condition is caused by a change in the normal balance of the yeast (Candida) and normal bacteria that live in the vagina. This change causes an overgrowth of yeast, which causes the inflammation. What increases the  risk? The condition is more likely to develop in women who: Take antibiotic medicines. Have diabetes. Take birth control pills. Are pregnant. Douche often. Have a weak body defense system (immune system). Have been taking steroid medicines for a long time. Frequently wear tight clothing. What are the signs or symptoms? Symptoms of this condition include: White, thick, creamy vaginal discharge. Swelling, itching, redness, and irritation of the vagina. The lips of the vagina (labia) may be affected as well. Pain or a burning feeling while urinating. Pain during sex. How is this diagnosed? This condition is diagnosed based on: Your medical history. A physical exam. A pelvic exam. Your health care provider will examine a sample of your vaginal discharge under a microscope. Your health care provider may send this sample for testing to confirm the diagnosis. How is this treated? This condition is treated with medicine. Medicines may be over-the-counter or prescription. You may be told to use one or more of the following: Medicine that is taken by mouth (orally). Medicine that is applied as a cream (topically). Medicine that is inserted directly into the vagina (suppository). Follow these instructions at home: Take or apply over-the-counter and prescription medicines only as told by your health care provider. Do not use tampons until your health care provider approves. Do not have sex until your infection has cleared. Sex can prolong or worsen your symptoms of infection. Ask your health care provider when it is safe to resume sexual activity. Keep all follow-up visits. This is important. How is this prevented?  Do not wear tight clothes, such as pantyhose or tight pants. Wear breathable cotton underwear. Do not use douches, perfumed soap, creams, or powders. Wipe from front to back after using the toilet. If you have diabetes, keep your blood sugar levels under control. Ask your health  care provider for other ways to prevent yeast infections. Contact a health care provider if: You have a fever. Your symptoms go away and then return. Your symptoms do not get better with treatment. Your symptoms get worse. You have new symptoms. You develop blisters in or around your vagina. You have blood coming from your vagina and it is not your menstrual period. You develop pain in your abdomen. Summary Vaginal yeast infection is a condition that causes discharge as well as soreness, swelling, and redness (inflammation) of the vagina. This condition is treated with medicine. Medicines may be over-the-counter or prescription. Take or apply over-the-counter and prescription medicines only as told by your health care provider. Do not douche. Resume sexual activity or use of tampons as instructed by your health care provider. Contact a health care provider if your symptoms do not get better with treatment or your symptoms go away and then return. This information is not intended to replace advice given to you by your health care provider. Make sure you discuss any questions you have with your health care provider. Document Revised: 02/20/2021 Document Reviewed: 02/20/2021 Elsevier Patient Education  [place].

## 2023-01-08 NOTE — Chronic Care Management (AMB) (Signed)
Chronic Care Management   CCM RN Visit Note  01/08/2023 Name: [name] MRN: [id number] DOB: 08/03/1951  Subjective: [name] is a 72 y.o. year old female who is a primary care patient of Janora Norlander, DO. The patient was referred to the Chronic Care Management team for assistance with care management needs subsequent to provider initiation of CCM services and plan of care.    Today's Visit:  Engaged with patient by telephone for follow up visit.        Goals Addressed             This Visit's Progress    CCM (DIABETES) EXPECTED OUTCOME:  MONITOR, SELF-MANAGE AND REDUCE SYMPTOMS OF DIABETES       Current Barriers:  Knowledge Deficits related to Diabetes management Chronic Disease Management support and education needs related to Diabetes and diet Pt reports she checks CBG 1-2 x per day, fasting ranges 132-146, does not have any random readings to report Pt reports she drinks diet soft drinks (maybe one per day) and water Pt does light exercise such as walking to the park near her house and exercises prescribed by PT Patient reports she has chronic yeast infections and feels as though she has another infection, has not used monistat as she has done in the past, states sometimes it is another type of infection requiring antibiotics RN care manager received message from primary care provider, okay for pt to use monistat and if not better, make appointment to be seen.  Planned Interventions: Reviewed medications with patient and discussed importance of medication adherence;        Reviewed prescribed diet with patient carbohydrate modified; Counseled on importance of regular laboratory monitoring as prescribed;        Reviewed scheduled/upcoming provider appointments including: follow up with primary care provider in March;         Advised patient, providing education and rationale, to check cbg twice daily and record        Review of patient status, including review  of consultants reports, relevant laboratory and other test results, and medications completed;       Advised patient to discuss any infections, change in health status with provider;      Reinforced importance of exercising Reviewed correlation of elevated blood sugar with infections and not healing RN care manager spoke with pt and informed her to try the monistat, if no better, call and make appointment to be seen by primary care provider, pt verbalizes understanding  Symptom Management: Take medications as prescribed   Attend all scheduled provider appointments Call pharmacy for medication refills 3-7 days in advance of running out of medications Perform all self care activities independently  Perform IADL's (shopping, preparing meals, housekeeping, managing finances) independently Call provider office for new concerns or questions  check blood sugar at prescribed times: twice daily check feet daily for cuts, sores or redness enter blood sugar readings and medication or insulin into daily log take the blood sugar log to all doctor visits take the blood sugar meter to all doctor visits trim toenails straight across drink 6 to 8 glasses of water each day fill half of plate with vegetables limit fast food meals to no more than 1 per week manage portion size read food labels for fat, fiber, carbohydrates and portion size wash and dry feet carefully every day Continue walking to the park and doing exercises given to you by PT You may use monistat for symptoms of  yeast infection, if this does not help, please call and make appointment to be seen by primary care provider  Follow Up Plan: Telephone follow up appointment with care management team member scheduled for:  02/25/23 at 1045 am          Plan:Telephone follow up appointment with care management team member scheduled for:  02/25/23 at 1045 am  Jacqlyn Larsen [facility], BSN RN Case Manager [place] [phone]

## 2023-01-08 NOTE — Chronic Care Management (AMB) (Signed)
Chronic Care Management   CCM RN Visit Note  01/08/2023 Name: [name] MRN: [id number] DOB: 1951-01-05  Subjective: [name] is a 72 y.o. year old female who is a primary care patient of Janora Norlander, DO. The patient was referred to the Chronic Care Management team for assistance with care management needs subsequent to provider initiation of CCM services and plan of care.    Today's Visit:  Engaged with patient by telephone for follow up visit.        Goals Addressed             This Visit's Progress    CCM (DIABETES) EXPECTED OUTCOME:  MONITOR, SELF-MANAGE AND REDUCE SYMPTOMS OF DIABETES       Current Barriers:  Knowledge Deficits related to Diabetes management Chronic Disease Management support and education needs related to Diabetes and diet Pt reports she checks CBG 1-2 x per day, fasting ranges 132-146, does not have any random readings to report Pt reports she drinks diet soft drinks (maybe one per day) and water Pt does light exercise such as walking to the park near her house and exercises prescribed by PT Patient reports she has chronic yeast infections and feels as though she has another infection, has not used monistat as she has done in the past, states sometimes it is another type of infection requiring antibiotics  Planned Interventions: Reviewed medications with patient and discussed importance of medication adherence;        Reviewed prescribed diet with patient carbohydrate modified; Counseled on importance of regular laboratory monitoring as prescribed;        Reviewed scheduled/upcoming provider appointments including: follow up with primary care provider in March;         Advised patient, providing education and rationale, to check cbg twice daily and record        Review of patient status, including review of consultants reports, relevant laboratory and other test results, and medications completed;       Advised patient to discuss any  infections, change in health status with provider;      Reinforced importance of exercising Reviewed correlation of elevated blood sugar with infections and not healing In Basket message sent to primary care provider reporting pt feels she has yeast infection, ask to advise does pt need to try monistat first and/ or make appointment to be seen  Symptom Management: Take medications as prescribed   Attend all scheduled provider appointments Call pharmacy for medication refills 3-7 days in advance of running out of medications Perform all self care activities independently  Perform IADL's (shopping, preparing meals, housekeeping, managing finances) independently Call provider office for new concerns or questions  check blood sugar at prescribed times: twice daily check feet daily for cuts, sores or redness enter blood sugar readings and medication or insulin into daily log take the blood sugar log to all doctor visits take the blood sugar meter to all doctor visits trim toenails straight across drink 6 to 8 glasses of water each day fill half of plate with vegetables limit fast food meals to no more than 1 per week manage portion size read food labels for fat, fiber, carbohydrates and portion size wash and dry feet carefully every day Continue walking to the park and doing exercises given to you by PT Symptoms of yeast infection reported to your primary care doctor, please follow up  Follow Up Plan: Telephone follow up appointment with care management team member scheduled for:  02/25/23 at 1045 am       CCM (HYPERTENSION) EXPECTED OUTCOME: MONITOR, SELF-MANAGE AND REDUCE SYMPTOMS OF HYPERTENSION       Current Barriers:  Knowledge Deficits related to Hypertension management Chronic Disease Management support and education needs related to Hypertension, diet Pt reports she lives alone but has boyfriend that "comes in and out frequently", pt reports she continues to drive, is independent  in all aspect of her care. 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)   Planned Interventions: Evaluation of current treatment plan related to hypertension self management and patient's adherence to plan as established by provider;   Provided education to patient re: stroke prevention, s/s of heart attack and stroke; Reviewed prescribed diet low sodium Reviewed medications with patient and discussed importance of compliance;  Counseled on the importance of exercise goals with target of 150 minutes per week Discussed plans with patient for ongoing care management follow up and provided patient with direct contact information for care management team; Advised patient, providing education and rationale, to monitor blood pressure daily and record, calling PCP for findings outside established parameters;  Reviewed importance of adherence to low sodium diet  Symptom Management: Take medications as prescribed   Attend all scheduled provider appointments Call pharmacy for medication refills 3-7 days in advance of running out of medications Perform all self care activities independently  Perform IADL's (shopping, preparing meals, housekeeping, managing finances) independently Call provider office for new concerns or questions  check blood pressure daily write blood pressure results in a log or diary learn about high blood pressure keep a blood pressure log take blood pressure log to all doctor appointments call doctor for signs and symptoms of high blood pressure keep all doctor appointments take medications for blood pressure exactly as prescribed begin an exercise program report new symptoms to your doctor eat more whole grains, fruits and vegetables, lean meats and healthy fats Follow  low sodium diet- read food labels, limit fast food  Follow Up Plan: Telephone follow up appointment with care management team member scheduled for:  02/25/23 at 1045 am          Plan:Telephone follow up appointment with care management team member scheduled for:  3.11.24 at [place] am  Jacqlyn Larsen [facility], BSN RN Case Manager [place] [phone]

## 2023-01-16 DIAGNOSIS — I10 Essential (primary) hypertension: Secondary | ICD-10-CM | POA: Diagnosis not present

## 2023-01-16 DIAGNOSIS — E1159 Type 2 diabetes mellitus with other circulatory complications: Secondary | ICD-10-CM | POA: Diagnosis not present

## 2023-02-11 ENCOUNTER — Ambulatory Visit (INDEPENDENT_AMBULATORY_CARE_PROVIDER_SITE_OTHER): Payer: PPO | Admitting: Family Medicine

## 2023-02-11 ENCOUNTER — Encounter: Payer: Self-pay | Admitting: Family Medicine

## 2023-02-11 VITALS — BP 129/66 | HR 69 | Temp 94.8°F | Ht 64.0 in | Wt 147.8 lb

## 2023-02-11 DIAGNOSIS — M79675 Pain in left toe(s): Secondary | ICD-10-CM

## 2023-02-11 NOTE — Patient Instructions (Signed)
Foot Pain Many things can cause foot pain. Common causes include injuries to the foot. The injuries include sprains or broken bones, or injuries that affect the nerves in the feet. Other causes of foot pain include arthritis, blisters, and bunions. To know what causes your foot pain, your health care provider will take a detailed history of your symptoms. They will also do a physical exam as well as imaging tests, such as X-ray or MRI. Follow these instructions at home: Managing pain, stiffness, and swelling  If told, put ice on the painful area. Put ice in a plastic bag. Place a towel between your skin and the bag. Leave the ice on for 20 minutes, 2-3 times a day. If your skin turns bright red, remove the ice right away to prevent skin damage. The risk of damage is higher if you cannot feel pain, heat, or cold. Activity Do not stand or walk for long periods. Do stretches to relieve foot pain and stiffness as told by your provider. Do not lift anything that is heavier than 10 lb (4.5 kg), or the limit that you are told, until your provider says that it is safe. Lifting a lot of weight can put added pressure on your feet. Return to your normal activities as told by your provider. Ask your provider what activities are safe for you. Lifestyle Wear comfortable, supportive shoes that fit you well. Do not wear high heels. Keep your feet clean and dry. General instructions Take over-the-counter and prescription medicines only as told by your provider. Rub your foot gently. Pay attention to any changes in your symptoms. Let your provider know if symptoms become worse. Keep all follow-up visits. Your provider will want to monitor your progress. Contact a health care provider if: Your pain does not get better after a few days of treatment at home. Your pain gets worse. You cannot stand on your foot. Your foot or toes are swollen. Your foot is numb or tingling. Get help right away if: Your foot  or toes turn white or blue. You have warmth and redness along your foot. This information is not intended to replace advice given to you by your health care provider. Make sure you discuss any questions you have with your health care provider. Document Revised: 09/04/2022 Document Reviewed: 09/04/2022 Elsevier Patient Education  [place].

## 2023-02-11 NOTE — Progress Notes (Signed)
   Acute Office Visit  Subjective:     Patient ID: [name], female    DOB: 12/24/1950, 72 y.o.   MRN: [id number]  Chief Complaint  Patient presents with   Toe Pain    Left big toe x 5 days.  Swelling moving across her left foot     Toe Pain  The incident occurred 5 to 7 days ago. The incident occurred at home. The injury mechanism was a twisting injury. The quality of the pain is described as aching. The pain is moderate. The pain has been Improving since onset. Pertinent negatives include no inability to bear weight, loss of motion, loss of sensation, muscle weakness, numbness or tingling. Associated symptoms comments: Erythema- improving. Tenderness- improving. She reports no foreign bodies present. The symptoms are aggravated by palpation, weight bearing and movement. She has tried acetaminophen, NSAIDs, ice and rest for the symptoms. The treatment provided moderate relief.   She is concerned about gout. She denies a history of this.   Review of Systems  Neurological:  Negative for tingling and numbness.        Objective:    BP 129/66   Pulse 69   Temp (!) 94.8 F (34.9 C) (Temporal)   Ht 5' 4"$  (1.626 m)   Wt 147 lb 12.8 oz (67 kg)   SpO2 95%   BMI 25.37 kg/m    Physical Exam Vitals and nursing note reviewed.  Constitutional:      General: She is not in acute distress.    Appearance: She is not ill-appearing, toxic-appearing or diaphoretic.  Pulmonary:     Effort: Pulmonary effort is normal. No respiratory distress.  Musculoskeletal:     Right lower leg: No edema.     Left lower leg: No edema.     Comments: Left great toe: mild swelling and tenderness of joint. No erythema or warmth. Full ROM. Sensation intact. Brisk cap refill.   Skin:    General: Skin is warm and dry.  Neurological:     General: No focal deficit present.     Mental Status: She is alert and oriented to person, place, and time.     Gait: Gait normal.  Psychiatric:        Mood and  Affect: Mood normal.        Behavior: Behavior normal.     No results found for any visits on 02/11/23.      Assessment & Plan:   [name] was seen today for toe pain.  Diagnoses and all orders for this visit:  Great toe pain, left Start after twisting injury, although presentation also aligns with gout. She does not have a known hx of gout. Will check uric acid level today. Symptoms have been improving. [place] for continue ibuprofen for now for inflammation. Last GFR >70.  -     Uric Acid  Return if symptoms worsen or fail to improve.  The patient indicates understanding of these issues and agrees with the plan.  Gwenlyn Perking, FNP

## 2023-02-12 LAB — URIC ACID: Uric Acid: 6.2 mg/dL (ref 3.1–7.9)

## 2023-02-19 ENCOUNTER — Ambulatory Visit (INDEPENDENT_AMBULATORY_CARE_PROVIDER_SITE_OTHER): Payer: PPO | Admitting: Family Medicine

## 2023-02-19 ENCOUNTER — Encounter: Payer: Self-pay | Admitting: Family Medicine

## 2023-02-19 VITALS — BP 129/68 | HR 60 | Temp 98.6°F | Ht 64.0 in | Wt 146.0 lb

## 2023-02-19 DIAGNOSIS — E119 Type 2 diabetes mellitus without complications: Secondary | ICD-10-CM | POA: Diagnosis not present

## 2023-02-19 DIAGNOSIS — R6889 Other general symptoms and signs: Secondary | ICD-10-CM | POA: Diagnosis not present

## 2023-02-19 DIAGNOSIS — I152 Hypertension secondary to endocrine disorders: Secondary | ICD-10-CM

## 2023-02-19 DIAGNOSIS — Z7689 Persons encountering health services in other specified circumstances: Secondary | ICD-10-CM | POA: Diagnosis not present

## 2023-02-19 DIAGNOSIS — E785 Hyperlipidemia, unspecified: Secondary | ICD-10-CM | POA: Diagnosis not present

## 2023-02-19 DIAGNOSIS — G720 Drug-induced myopathy: Secondary | ICD-10-CM | POA: Diagnosis not present

## 2023-02-19 DIAGNOSIS — S93509D Unspecified sprain of unspecified toe(s), subsequent encounter: Secondary | ICD-10-CM

## 2023-02-19 DIAGNOSIS — E1169 Type 2 diabetes mellitus with other specified complication: Secondary | ICD-10-CM | POA: Diagnosis not present

## 2023-02-19 DIAGNOSIS — E1159 Type 2 diabetes mellitus with other circulatory complications: Secondary | ICD-10-CM

## 2023-02-19 LAB — LIPID PANEL

## 2023-02-19 LAB — BAYER DCA HB A1C WAIVED: HB A1C (BAYER DCA - WAIVED): 6.2 % — ABNORMAL HIGH (ref 4.8–5.6)

## 2023-02-19 NOTE — Progress Notes (Signed)
Subjective: CC:DM PCP: Stephanie Norlander, DO [id number] [name] is a 72 y.o. female presenting to clinic today for:  1. 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  She reports occasional increased urinary frequency but otherwise denies any signs of hyper or hypoglycemia.  She does admit to feeling a little bit colder than normal and is not sure if this is related to the Plavix use or something else.  She is fasting today and would like to have labs done.  Last eye exam: needs and she will schedule this Last foot exam: needs Last A1c:  Lab Results  Component Value Date   HGBA1C 6.4 (H) 10/24/2022   Nephropathy screen indicated?: UTD Last flu, zoster and/or pneumovax:  Immunization History  Administered Date(s) Administered   Fluad Quad(high Dose 65+) 09/07/2019, 11/06/2021, 10/24/2022   Influenza,inj,Quad PF,6+ Mos 10/29/2016, 01/03/2018, 10/08/2018   Moderna SARS-COV2 Booster Vaccination 01/18/2021   Moderna Sars-Covid-2 Vaccination 02/25/2020, 03/29/2020   Pneumococcal Conjugate-13 08/02/2017   Pneumococcal Polysaccharide-23 08/04/2018   Tdap 10/30/2011   Zoster Recombinat (Shingrix) 02/07/2022, 05/09/2022    ROS: Per HPI  Allergies  Allergen Reactions   Glipizide Itching   Pravachol [Pravastatin Sodium] Other (See Comments)    Stomach pain and constipation    Past Medical History:  Diagnosis Date   Acute CVA (cerebrovascular accident) ([place]) 07/20/2022   Anxiety    Arthritis    of the neck   Colon polyps    Diabetes mellitus without complication ([place])    Esophageal stricture    Essential hypertension 01/05/2020   GERD (gastroesophageal reflux disease)    Hiatal Hernia   History of uterine cancer    Hyperlipidemia    Hypertension    Occipital stroke ([place]) 02/13/2019   Osteoporosis    Pacemaker    Symptomatic bradycardia     Tingling in extremities 07/20/2022    Current Outpatient Medications:    Blood Glucose Monitoring Suppl (ONE TOUCH ULTRA 2) w/Device KIT, 1 Device by Does not apply route daily., Disp: 1 each, Rfl: 0   BYSTOLIC 5 MG tablet, TAKE (1) TABLET TWICE A DAY., Disp: 180 tablet, Rfl: 3   calcium citrate-vitamin D (CITRACAL+D) 315-200 MG-UNIT per tablet, Take 1 tablet by mouth daily., Disp: , Rfl:    carboxymethylcellulose (REFRESH PLUS) 0.5 % SOLN, Place 1 drop into both eyes daily as needed (dry eyes)., Disp: , Rfl:    clopidogrel (PLAVIX) 75 MG tablet, Take 1 tablet (75 mg total) by mouth daily., Disp: 90 tablet, Rfl: 3   COLLAGEN PO, Take 0.5 Scoops by mouth daily., Disp: , Rfl:    diclofenac Sodium (VOLTAREN) 1 % GEL, Apply 4 g topically 4 (four) times daily., Disp: 400 g, Rfl: PRN   Evolocumab (REPATHA SURECLICK) XX123456 MG/ML SOAJ, Inject 140 mg into the skin every 14 (fourteen) days., Disp: 2 mL, Rfl: 5   glucose blood (ONETOUCH ULTRA) test strip, CHECK BLOOD SUGAR up to 4 times DAILY OR AS DIRECTED E11.9, Disp: 100 strip, Rfl: 2   metFORMIN (GLUCOPHAGE) 500 MG tablet, Take 1 tablet (500 mg total) by mouth daily with supper., Disp: 90 tablet, Rfl: 3   OneTouch Delica Lancets 99991111 MISC, CHECK BLOOD SUGAR up to 4 times DAILY OR AS DIRECTED.E11.9, Disp: 100 each, Rfl: 2   pantoprazole (PROTONIX) 40 MG tablet, Take 1 tablet (40 mg total) by mouth daily., Disp: 90 tablet,  Rfl: 3   sertraline (ZOLOFT) 50 MG tablet, TAKE 1 TABLET DAILY, Disp: 90 tablet, Rfl: 3 Social History   Socioeconomic History   Marital status: Significant Other    Spouse name: Not on file   Number of children: 1   Years of education: GED   Highest education level: GED or equivalent  Occupational History   Occupation: retired    Comment: Engineer, manufacturing systems  Tobacco Use   Smoking status: Never   Smokeless tobacco: Never  Scientific laboratory technician Use: Never used  Substance and Sexual Activity   Alcohol use: No    Comment: may have a  social drink rarely   Drug use: No   Sexual activity: Not Currently  Other Topics Concern   Not on file  Social History Narrative   Lives with sig other   Social Determinants of Health   Financial Resource Strain: Low Risk  (12/13/2022)   Overall Financial Resource Strain (CARDIA)    Difficulty of Paying Living Expenses: Not very hard  Food Insecurity: No Food Insecurity (12/13/2022)   Hunger Vital Sign    Worried About Running Out of Food in the Last Year: Never true    [place] in the Last Year: Never true  Transportation Needs: No Transportation Needs (11/12/2022)   PRAPARE - Hydrologist (Medical): No    Lack of Transportation (Non-Medical): No  Physical Activity: Insufficiently Active (12/13/2022)   Exercise Vital Sign    Days of Exercise per Week: 7 days    Minutes of Exercise per Session: 10 min  Stress: No Stress Concern Present (12/13/2022)   [place]    Feeling of Stress : Not at all  Social Connections: Moderately Integrated (12/13/2022)   Social Connection and Isolation Panel [NHANES]    Frequency of Communication with Friends and Family: More than three times a week    Frequency of Social Gatherings with Friends and Family: Once a week    Attends Religious Services: More than 4 times per year    Active Member of [employer] or Organizations: No    Attends Archivist Meetings: Never    Marital Status: Living with partner  Recent Concern: Social Connections - Moderately Isolated (11/12/2022)   Social Connection and Isolation Panel [NHANES]    Frequency of Communication with Friends and Family: More than three times a week    Frequency of Social Gatherings with Friends and Family: More than three times a week    Attends Religious Services: Never    Marine scientist or Organizations: No    Attends Archivist Meetings: Never    Marital Status:  Living with partner  Intimate Partner Violence: Not At Risk (12/13/2022)   Humiliation, Afraid, Rape, and Kick questionnaire    Fear of Current or Ex-Partner: No    Emotionally Abused: No    Physically Abused: No    Sexually Abused: No   Family History  Problem Relation Age of Onset   Heart disease Mother    Heart attack Mother    Heart disease Father    Heart attack Father    Cancer Sister        breast   Breast cancer Sister    COPD Sister    Alcohol abuse Brother    Healthy Daughter     Objective: Office vital signs reviewed. BP 129/68   Pulse 60   Temp  98.6 F (37 C)   Ht '5\' 4"'$  (1.626 m)   Wt 146 lb (66.2 kg)   SpO2 96%   BMI 25.06 kg/m   Physical Examination:  General: Awake, alert, well nourished, No acute distress HEENT:sclera white, MMM Cardio: regular rate and rhythm, S1S2 heard, no murmurs appreciated Pulm: clear to auscultation bilaterally, no wheezes, rhonchi or rales; normal work of breathing on room air Extremities: cool, well perfused, No edema, cyanosis or clubbing; +2 pulses bilaterally MSK: normal gait and station Skin: dry; intact; no rashes or lesions Neuro:  Diabetic Foot Exam - Simple   Simple Foot Form Diabetic Foot exam was performed with the following findings: Yes 02/19/2023 10:32 AM  Visual Inspection See comments: Yes Sensation Testing Intact to touch and monofilament testing bilaterally: Yes Pulse Check Posterior Tibialis and Dorsalis pulse intact bilaterally: Yes Comments Mild ecchymosis and soft tissue swelling at the 1st MTP on left.      Assessment/ Plan: 72 y.o. female   Type 2 diabetes mellitus without complication, without long-term current use of insulin ([place]) - Plan: Bayer DCA Hb A1c Waived  Hypertension associated with diabetes ([place])  Hyperlipidemia associated with type 2 diabetes mellitus ([place]) w/ goal LDL <70 - Plan: Lipid Panel  Drug-induced myopathy  Sprain of toe, subsequent encounter  Cold  intolerance  Sugar remains under good control.  No changes.  Blood pressure well-controlled.  Continue current regimen  Check fasting lipid panel.  History of statin induced myopathy so she will have to continue on the [place].  Sprain of toe seems to be healing    Will add TSH given reports of cold intolerance to lab draw today.  She may follow-up in 4 to 6 months, sooner if concerns arise  No orders of the defined types were placed in this encounter.  No orders of the defined types were placed in this encounter.    Stephanie Norlander, DO [place] [phone]

## 2023-02-20 ENCOUNTER — Ambulatory Visit: Payer: PPO

## 2023-02-20 DIAGNOSIS — I495 Sick sinus syndrome: Secondary | ICD-10-CM

## 2023-02-20 LAB — CUP PACEART REMOTE DEVICE CHECK
Battery Impedance: 1152 Ohm
Battery Remaining Longevity: 65 mo
Battery Voltage: 2.76 V
Brady Statistic AP VP Percent: 0 %
Brady Statistic AP VS Percent: 2 %
Brady Statistic AS VP Percent: 0 %
Brady Statistic AS VS Percent: 98 %
Date Time Interrogation Session: 20240306102431
Implantable Lead Connection Status: 753985
Implantable Lead Connection Status: 753985
Implantable Lead Implant Date: 20050914
Implantable Lead Implant Date: 20050914
Implantable Lead Location: 753859
Implantable Lead Location: 753860
Implantable Lead Model: 5076
Implantable Lead Model: 5076
Implantable Pulse Generator Implant Date: 20150605
Lead Channel Impedance Value: 517 Ohm
Lead Channel Impedance Value: 544 Ohm
Lead Channel Pacing Threshold Amplitude: 0.5 V
Lead Channel Pacing Threshold Amplitude: 0.875 V
Lead Channel Pacing Threshold Pulse Width: 0.4 ms
Lead Channel Pacing Threshold Pulse Width: 0.4 ms
Lead Channel Setting Pacing Amplitude: 2 V
Lead Channel Setting Pacing Amplitude: 2.5 V
Lead Channel Setting Pacing Pulse Width: 0.4 ms
Lead Channel Setting Sensing Sensitivity: 4 mV
Zone Setting Status: 755011
Zone Setting Status: 755011

## 2023-02-20 LAB — LIPID PANEL
Chol/HDL Ratio: 2.8 ratio (ref 0.0–4.4)
Cholesterol, Total: 151 mg/dL (ref 100–199)
HDL: 54 mg/dL (ref >39)
LDL Chol Calc (NIH): 71 mg/dL (ref 0–99)
Triglycerides: 149 mg/dL (ref 0–149)
VLDL Cholesterol Cal: 26 mg/dL (ref 5–40)

## 2023-02-21 LAB — SPECIMEN STATUS REPORT

## 2023-02-21 LAB — TSH: TSH: 1.53 u[IU]/mL (ref 0.450–4.500)

## 2023-02-25 ENCOUNTER — Telehealth: Payer: PPO

## 2023-03-04 ENCOUNTER — Other Ambulatory Visit: Payer: Self-pay | Admitting: Family Medicine

## 2023-03-04 DIAGNOSIS — Z1231 Encounter for screening mammogram for malignant neoplasm of breast: Secondary | ICD-10-CM

## 2023-03-06 ENCOUNTER — Ambulatory Visit
Admission: RE | Admit: 2023-03-06 | Discharge: 2023-03-06 | Disposition: A | Discharge status: Home / Self Care | Payer: PPO | Source: Ambulatory Visit | Attending: Family Medicine | Admitting: Family Medicine

## 2023-03-06 DIAGNOSIS — Z1231 Encounter for screening mammogram for malignant neoplasm of breast: Secondary | ICD-10-CM | POA: Diagnosis not present

## 2023-03-27 NOTE — Progress Notes (Signed)
Remote pacemaker transmission.   

## 2023-03-28 ENCOUNTER — Encounter: Payer: Self-pay | Admitting: Internal Medicine

## 2023-03-28 ENCOUNTER — Ambulatory Visit: Payer: PPO | Attending: Internal Medicine | Admitting: Internal Medicine

## 2023-03-28 VITALS — BP 126/74 | HR 66 | Ht 64.0 in | Wt 150.4 lb

## 2023-03-28 DIAGNOSIS — I495 Sick sinus syndrome: Secondary | ICD-10-CM

## 2023-03-28 DIAGNOSIS — I152 Hypertension secondary to endocrine disorders: Secondary | ICD-10-CM

## 2023-03-28 DIAGNOSIS — E1159 Type 2 diabetes mellitus with other circulatory complications: Secondary | ICD-10-CM | POA: Diagnosis not present

## 2023-03-28 DIAGNOSIS — Z950 Presence of cardiac pacemaker: Secondary | ICD-10-CM | POA: Diagnosis not present

## 2023-03-28 NOTE — Progress Notes (Signed)
HPI [name] returns today for followup. She is a pleasant 72 yo woman with HTN, sinus node dysfunction s/p PPM insertion who has had a stroke over a year ago. She has recovered and was noted not to have atrial fib on her device interrogation. She denies chest pain or sob. No edema. She has recovered from Bell's palsy.  Allergies  Allergen Reactions   Glipizide Itching   Pravachol [Pravastatin Sodium] Other (See Comments)    Stomach pain and constipation      Current Outpatient Medications  Medication Sig Dispense Refill   Blood Glucose Monitoring Suppl (ONE TOUCH ULTRA 2) w/Device KIT 1 Device by Does not apply route daily. 1 each 0   BYSTOLIC 5 MG tablet TAKE (1) TABLET TWICE A DAY. 180 tablet 3   calcium citrate-vitamin D (CITRACAL+D) 315-200 MG-UNIT per tablet Take 1 tablet by mouth daily.     carboxymethylcellulose (REFRESH PLUS) 0.5 % SOLN Place 1 drop into both eyes daily as needed (dry eyes).     clopidogrel (PLAVIX) 75 MG tablet Take 1 tablet (75 mg total) by mouth daily. 90 tablet 3   COLLAGEN PO Take 0.5 Scoops by mouth daily.     diclofenac Sodium (VOLTAREN) 1 % GEL Apply 4 g topically 4 (four) times daily. 400 g PRN   Evolocumab (REPATHA SURECLICK) 140 MG/ML SOAJ Inject 140 mg into the skin every 14 (fourteen) days. 2 mL 5   glucose blood (ONETOUCH ULTRA) test strip CHECK BLOOD SUGAR up to 4 times DAILY OR AS DIRECTED E11.9 100 strip 2   metFORMIN (GLUCOPHAGE) 500 MG tablet Take 1 tablet (500 mg total) by mouth daily with supper. 90 tablet 3   OneTouch Delica Lancets 33G MISC CHECK BLOOD SUGAR up to 4 times DAILY OR AS DIRECTED.E11.9 100 each 2   pantoprazole (PROTONIX) 40 MG tablet Take 1 tablet (40 mg total) by mouth daily. 90 tablet 3   sertraline (ZOLOFT) 50 MG tablet TAKE 1 TABLET DAILY 90 tablet 3   No current facility-administered medications for this visit.     Past Medical History:  Diagnosis Date   Acute CVA (cerebrovascular accident) 07/20/2022    Anxiety    Arthritis    of the neck   Colon polyps    Diabetes mellitus without complication    Esophageal stricture    Essential hypertension 01/05/2020   GERD (gastroesophageal reflux disease)    Hiatal Hernia   History of uterine cancer    Hyperlipidemia    Hypertension    Occipital stroke 02/13/2019   Osteoporosis    Pacemaker    Symptomatic bradycardia    Tingling in extremities 07/20/2022    ROS:   All systems reviewed and negative except as noted in the HPI.   Past Surgical History:  Procedure Laterality Date   APPENDECTOMY     CHOLECYSTECTOMY     LAPAROSCOPIC TOTAL HYSTERECTOMY     LYMPH NODE DISSECTION     bilateral pelvic and right-sided periaortic   PACEMAKER GENERATOR CHANGE N/A 05/21/2014   Procedure: PACEMAKER GENERATOR CHANGE;  Surgeon: Marinus Maw, MD;  Location: [facility] CATH LAB;  Service: Cardiovascular;  Laterality: N/A;   status post pacemaker     metronic [id number]   TRANSTHORACIC ECHOCARDIOGRAM  08/30/04   TUBAL LIGATION       Family History  Problem Relation Age of Onset   Heart disease Mother    Heart attack Mother    Heart disease Father  Heart attack Father    Cancer Sister        breast   Breast cancer Sister    COPD Sister    Alcohol abuse Brother    Healthy Daughter      Social History   Socioeconomic History   Marital status: Significant Other    Spouse name: Not on file   Number of children: 1   Years of education: GED   Highest education level: GED or equivalent  Occupational History   Occupation: retired    Comment: Scientist, product/process development  Tobacco Use   Smoking status: Never   Smokeless tobacco: Never  Building services engineer Use: Never used  Substance and Sexual Activity   Alcohol use: No    Comment: may have a social drink rarely   Drug use: No   Sexual activity: Not Currently  Other Topics Concern   Not on file  Social History Narrative   Lives with sig other   Social Determinants of Health   Financial Resource  Strain: Low Risk  (12/13/2022)   Overall Financial Resource Strain (CARDIA)    Difficulty of Paying Living Expenses: Not very hard  Food Insecurity: No Food Insecurity (12/13/2022)   Hunger Vital Sign    Worried About Running Out of Food in the Last Year: Never true    Ran Out of Food in the Last Year: Never true  Transportation Needs: No Transportation Needs (11/12/2022)   PRAPARE - Administrator, Civil Service (Medical): No    Lack of Transportation (Non-Medical): No  Physical Activity: Insufficiently Active (12/13/2022)   Exercise Vital Sign    Days of Exercise per Week: 7 days    Minutes of Exercise per Session: 10 min  Stress: No Stress Concern Present (12/13/2022)   [employer] of Occupational Health - Occupational Stress Questionnaire    Feeling of Stress : Not at all  Social Connections: Moderately Integrated (12/13/2022)   Social Connection and Isolation Panel [NHANES]    Frequency of Communication with Friends and Family: More than three times a week    Frequency of Social Gatherings with Friends and Family: Once a week    Attends Religious Services: More than 4 times per year    Active Member of [employer] or Organizations: No    Attends Banker Meetings: Never    Marital Status: Living with partner  Recent Concern: Social Connections - Moderately Isolated (11/12/2022)   Social Connection and Isolation Panel [NHANES]    Frequency of Communication with Friends and Family: More than three times a week    Frequency of Social Gatherings with Friends and Family: More than three times a week    Attends Religious Services: Never    Database administrator or Organizations: No    Attends Banker Meetings: Never    Marital Status: Living with partner  Intimate Partner Violence: Not At Risk (12/13/2022)   Humiliation, Afraid, Rape, and Kick questionnaire    Fear of Current or Ex-Partner: No    Emotionally Abused: No    Physically Abused: No     Sexually Abused: No     BP 126/74   Pulse 66   Ht 5\' 4"  (1.626 m)   Wt 150 lb 6.4 oz (68.2 kg)   SpO2 95%   BMI 25.82 kg/m   Physical Exam:  Well appearing 73 yo woman, NAD HEENT: Unremarkable Neck:  No JVD, no thyromegally Lymphatics:  No adenopathy Back:  No  CVA tenderness Lungs:  Clear with no wheezes HEART:  Regular rate rhythm, no murmurs, no rubs, no clicks Abd:  soft, positive bowel sounds, no organomegally, no rebound, no guarding Ext:  2 plus pulses, no edema, no cyanosis, no clubbing Skin:  No rashes no nodules Neuro:  CN II through XII intact, motor grossly intact  EKG - nsr  DEVICE  Normal device function.  See PaceArt for details.   Assess/Plan: 1. Sinus node dysfunction - she is asymptomatic s/p PPM insertion.  2. 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 We will recheck in several months. 3. HTN - her SBP is elevated and I have encouraged her to reduce her sodium intake. She notes that her bp has been better at home. 4. PPM -her medtronic DDD PM is working normally. She has not had atrial fib.   Sharlot GowdaGregg Chrissy Ealey,MD

## 2023-03-28 NOTE — Patient Instructions (Addendum)
Medication Instructions:  Your physician recommends that you continue on your current medications as directed. Please refer to the Current Medication list given to you today.  *If you need a refill on your cardiac medications before your next appointment, please call your pharmacy*  Lab Work: None ordered.  If you have labs (blood work) drawn today and your tests are completely normal, you will receive your results only by: MyChart Message (if you have MyChart) OR A paper copy in the mail If you have any lab test that is abnormal or we need to change your treatment, we will call you to review the results.  Testing/Procedures: None ordered.  Follow-Up: At [facility], you and your health needs are our priority.  As part of our continuing mission to provide you with exceptional heart care, we have created designated Provider Care Teams.  These Care Teams include your primary Cardiologist (physician) and Advanced Practice Providers (APPs -  Physician Assistants and Nurse Practitioners) who all work together to provide you with the care you need, when you need it.   Your next appointment:   1 year(s)  The format for your next appointment:   In Person  Provider:   Lewayne Bunting, MD{or one of the following Advanced Practice Providers on your designated Care Team:   Francis Dowse, [place] Casimiro Needle "Mardelle Matte" Lanna Poche, [place]  Remote monitoring is used to monitor your Pacemaker from home. This monitoring reduces the number of office visits required to check your device to one time per year. It allows [place] to keep an eye on the functioning of your device to ensure it is working properly. You are scheduled for a device check from home on 05/22/23. You may send your transmission at any time that day. If you have a wireless device, the transmission will be sent automatically. After your physician reviews your transmission, you will receive a postcard with your next transmission date.

## 2023-04-04 ENCOUNTER — Encounter: Payer: Self-pay | Admitting: Internal Medicine

## 2023-04-24 ENCOUNTER — Ambulatory Visit (INDEPENDENT_AMBULATORY_CARE_PROVIDER_SITE_OTHER): Payer: PPO | Admitting: *Deleted

## 2023-04-24 DIAGNOSIS — E119 Type 2 diabetes mellitus without complications: Secondary | ICD-10-CM

## 2023-04-24 DIAGNOSIS — I152 Hypertension secondary to endocrine disorders: Secondary | ICD-10-CM

## 2023-04-24 NOTE — Chronic Care Management (AMB) (Signed)
Chronic Care Management   CCM RN Visit Note  04/24/2023 Name: [name] MRN: [id number] DOB: 09/04/1951  Subjective: [name] is a 72 y.o. year old female who is a primary care patient of Stephanie Ip, DO. The patient was referred to the Chronic Care Management team for assistance with care management needs subsequent to provider initiation of CCM services and plan of care.    Today's Visit:  Engaged with patient by telephone for follow up visit.        Goals Addressed             This Visit's Progress    CCM (DIABETES) EXPECTED OUTCOME:  MONITOR, SELF-MANAGE AND REDUCE SYMPTOMS OF DIABETES       Current Barriers:  Knowledge Deficits related to Diabetes management Chronic Disease Management support and education needs related to Diabetes and diet Pt reports she checks CBG 1-2 x per day, fasting ranges 109-128 does not have any random readings to report Pt reports she drinks diet soft drinks (maybe one per day) and water Pt does light exercise such as walking to the park near her house and exercises prescribed by PT Patient reports she has chronic yeast infections but feels at present is cleared up  Planned Interventions: Reviewed medications with patient and discussed importance of medication adherence;        Counseled on importance of regular laboratory monitoring as prescribed;        Reviewed scheduled/upcoming provider appointments including: follow up with primary care provider in March;         Advised patient, providing education and rationale, to check cbg twice daily and record        Review of patient status, including review of consultants reports, relevant laboratory and other test results, and medications completed;       Advised patient to discuss any infections, change in health status with provider;      Reviewed importance of exercising Reinforced correlation of elevated blood sugar with infections and not healing Reinforced carbohydrate  modified diet  Symptom Management: Take medications as prescribed   Attend all scheduled provider appointments Call pharmacy for medication refills 3-7 days in advance of running out of medications Perform all self care activities independently  Perform IADL's (shopping, preparing meals, housekeeping, managing finances) independently Call provider office for new concerns or questions  check blood sugar at prescribed times: twice daily check feet daily for cuts, sores or redness enter blood sugar readings and medication or insulin into daily log take the blood sugar log to all doctor visits take the blood sugar meter to all doctor visits trim toenails straight across drink 6 to 8 glasses of water each day eat fish at least once per week fill half of plate with vegetables limit fast food meals to no more than 1 per week manage portion size read food labels for fat, fiber, carbohydrates and portion size set a realistic goal wash and dry feet carefully every day Continue walking to the park and doing exercises given to you by PT- keep up the good work!  Follow Up Plan: Telephone follow up appointment with care management team member scheduled for:   07/25/23 at 130 pm       CCM (HYPERTENSION) EXPECTED OUTCOME: MONITOR, SELF-MANAGE AND REDUCE SYMPTOMS OF HYPERTENSION       Current Barriers:  Knowledge Deficits related to Hypertension management Chronic Disease Management support and education needs related to Hypertension, diet Pt reports she lives alone but  has boyfriend that "comes in and out frequently", pt reports she continues to drive, is independent in all aspect of her care. 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)  Pt reports she has all medications and taking as  prescribed  Planned Interventions: Evaluation of current treatment plan related to hypertension self management and patient's adherence to plan as established by provider;   Provided education to patient re: stroke prevention, s/s of heart attack and stroke; Reviewed medications with patient and discussed importance of compliance;  Counseled on the importance of exercise goals with target of 150 minutes per week Discussed plans with patient for ongoing care management follow up and provided patient with direct contact information for care management team; Advised patient, providing education and rationale, to monitor blood pressure daily and record, calling PCP for findings outside established parameters;  Reinforced importance of adherence to low sodium diet and reading food labels  Symptom Management: Take medications as prescribed   Attend all scheduled provider appointments Call pharmacy for medication refills 3-7 days in advance of running out of medications Perform all self care activities independently  Perform IADL's (shopping, preparing meals, housekeeping, managing finances) independently Call provider office for new concerns or questions  check blood pressure daily write blood pressure results in a log or diary learn about high blood pressure keep a blood pressure log take blood pressure log to all doctor appointments call doctor for signs and symptoms of high blood pressure develop an action plan for high blood pressure keep all doctor appointments take medications for blood pressure exactly as prescribed begin an exercise program report new symptoms to your doctor eat more whole grains, fruits and vegetables, lean meats and healthy fats Follow low sodium diet- read food labels, limit fast food Continue to exercise  Follow Up Plan: Telephone follow up appointment with care management team member scheduled for:  07/25/23 at 130 pm          Plan:Telephone follow up  appointment with care management team member scheduled for:  07/25/23 at 130 pm  Irving Shows [facility], BSN RN Case Manager [facility] [phone]

## 2023-04-24 NOTE — Patient Instructions (Signed)
Please call the care guide team at [phone] if you need to cancel or reschedule your appointment.   If you are experiencing a Mental Health or Behavioral Health Crisis or need someone to talk to, please call the Suicide and Crisis Lifeline: 988 call the [place] National Suicide Prevention Lifeline: 307-016-3771 or TTY: (478)431-5006 TTY (720) 020-6285) to talk to a trained counselor call 1-800-273-TALK (toll free, 24 hour hotline) go to [facility] [address], [place] [phone]) call the [facility]: [phone] call 911   Following is a copy of the CCM Program Consent:  CCM service includes personalized support from designated clinical staff supervised by the physician, including individualized plan of care and coordination with other care providers 24/7 contact phone numbers for assistance for urgent and routine care needs. Service will only be billed when office clinical staff spend 20 minutes or more in a month to coordinate care. Only one practitioner may furnish and bill the service in a calendar month. The patient may stop CCM services at amy time (effective at the end of the month) by phone call to the office staff. The patient will be responsible for cost sharing (co-pay) or up to 20% of the service fee (after annual deductible is met)  Following is a copy of your full provider care plan:   Goals Addressed             This Visit's Progress    CCM (DIABETES) EXPECTED OUTCOME:  MONITOR, SELF-MANAGE AND REDUCE SYMPTOMS OF DIABETES       Current Barriers:  Knowledge Deficits related to Diabetes management Chronic Disease Management support and education needs related to Diabetes and diet Pt reports she checks CBG 1-2 x per day, fasting ranges 109-128 does not have any random readings to report Pt reports she drinks diet soft drinks (maybe one per day) and water Pt does light exercise such as walking to the park  near her house and exercises prescribed by PT Patient reports she has chronic yeast infections but feels at present is cleared up  Planned Interventions: Reviewed medications with patient and discussed importance of medication adherence;        Counseled on importance of regular laboratory monitoring as prescribed;        Reviewed scheduled/upcoming provider appointments including: follow up with primary care provider in March;         Advised patient, providing education and rationale, to check cbg twice daily and record        Review of patient status, including review of consultants reports, relevant laboratory and other test results, and medications completed;       Advised patient to discuss any infections, change in health status with provider;      Reviewed importance of exercising Reinforced correlation of elevated blood sugar with infections and not healing Reinforced carbohydrate modified diet  Symptom Management: Take medications as prescribed   Attend all scheduled provider appointments Call pharmacy for medication refills 3-7 days in advance of running out of medications Perform all self care activities independently  Perform IADL's (shopping, preparing meals, housekeeping, managing finances) independently Call provider office for new concerns or questions  check blood sugar at prescribed times: twice daily check feet daily for cuts, sores or redness enter blood sugar readings and medication or insulin into daily log take the blood sugar log to all doctor visits take the blood sugar meter to all doctor visits trim toenails straight across drink 6 to 8 glasses  of water each day eat fish at least once per week fill half of plate with vegetables limit fast food meals to no more than 1 per week manage portion size read food labels for fat, fiber, carbohydrates and portion size set a realistic goal wash and dry feet carefully every day Continue walking to the park and doing  exercises given to you by PT- keep up the good work!  Follow Up Plan: Telephone follow up appointment with care management team member scheduled for:   07/25/23 at 130 pm       CCM (HYPERTENSION) EXPECTED OUTCOME: MONITOR, SELF-MANAGE AND REDUCE SYMPTOMS OF HYPERTENSION       Current Barriers:  Knowledge Deficits related to Hypertension management Chronic Disease Management support and education needs related to Hypertension, diet Pt reports she lives alone but has boyfriend that "comes in and out frequently", pt reports she continues to drive, is independent in all aspect of her care. 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)  Pt reports she has all medications and taking as prescribed  Planned Interventions: Evaluation of current treatment plan related to hypertension self management and patient's adherence to plan as established by provider;   Provided education to patient re: stroke prevention, s/s of heart attack and stroke; Reviewed medications with patient and discussed importance of compliance;  Counseled on the importance of exercise goals with target of 150 minutes per week Discussed plans with patient for ongoing care management follow up and provided patient with direct contact information for care management team; Advised patient, providing education and rationale, to monitor blood pressure daily and record, calling PCP for findings outside established parameters;  Reinforced importance of adherence to low sodium diet and reading food labels  Symptom Management: Take medications as prescribed   Attend all scheduled provider appointments Call pharmacy for medication refills 3-7 days in advance of running out of medications Perform all self care activities independently   Perform IADL's (shopping, preparing meals, housekeeping, managing finances) independently Call provider office for new concerns or questions  check blood pressure daily write blood pressure results in a log or diary learn about high blood pressure keep a blood pressure log take blood pressure log to all doctor appointments call doctor for signs and symptoms of high blood pressure develop an action plan for high blood pressure keep all doctor appointments take medications for blood pressure exactly as prescribed begin an exercise program report new symptoms to your doctor eat more whole grains, fruits and vegetables, lean meats and healthy fats Follow low sodium diet- read food labels, limit fast food Continue to exercise  Follow Up Plan: Telephone follow up appointment with care management team member scheduled for:  07/25/23 at 130 pm          Patient verbalizes understanding of instructions and care plan provided today and agrees to view in MyChart. Active MyChart status and patient understanding of how to access instructions and care plan via MyChart confirmed with patient.  Telephone follow up appointment with care management team member scheduled for:  07/25/23 at 130 pm

## 2023-05-17 DIAGNOSIS — E1159 Type 2 diabetes mellitus with other circulatory complications: Secondary | ICD-10-CM

## 2023-05-17 DIAGNOSIS — I10 Essential (primary) hypertension: Secondary | ICD-10-CM | POA: Diagnosis not present

## 2023-05-17 DIAGNOSIS — Z7984 Long term (current) use of oral hypoglycemic drugs: Secondary | ICD-10-CM | POA: Diagnosis not present

## 2023-05-22 ENCOUNTER — Ambulatory Visit (INDEPENDENT_AMBULATORY_CARE_PROVIDER_SITE_OTHER): Payer: PPO

## 2023-05-22 DIAGNOSIS — I495 Sick sinus syndrome: Secondary | ICD-10-CM | POA: Diagnosis not present

## 2023-05-22 LAB — CUP PACEART REMOTE DEVICE CHECK
Battery Impedance: 1287 Ohm
Battery Remaining Longevity: 60 mo
Battery Voltage: 2.76 V
Brady Statistic AP VP Percent: 1 %
Brady Statistic AP VS Percent: 3 %
Brady Statistic AS VP Percent: 0 %
Brady Statistic AS VS Percent: 96 %
Date Time Interrogation Session: 20240605095555
Implantable Lead Connection Status: 753985
Implantable Lead Connection Status: 753985
Implantable Lead Implant Date: 20050914
Implantable Lead Implant Date: 20050914
Implantable Lead Location: 753859
Implantable Lead Location: 753860
Implantable Lead Model: 5076
Implantable Lead Model: 5076
Implantable Pulse Generator Implant Date: 20150605
Lead Channel Impedance Value: 518 Ohm
Lead Channel Impedance Value: 535 Ohm
Lead Channel Pacing Threshold Amplitude: 0.375 V
Lead Channel Pacing Threshold Amplitude: 0.625 V
Lead Channel Pacing Threshold Pulse Width: 0.4 ms
Lead Channel Pacing Threshold Pulse Width: 0.4 ms
Lead Channel Setting Pacing Amplitude: 2 V
Lead Channel Setting Pacing Amplitude: 2.5 V
Lead Channel Setting Pacing Pulse Width: 0.4 ms
Lead Channel Setting Sensing Sensitivity: 5.6 mV
Zone Setting Status: 755011
Zone Setting Status: 755011

## 2023-06-03 ENCOUNTER — Encounter: Payer: Self-pay | Admitting: Family Medicine

## 2023-06-03 ENCOUNTER — Ambulatory Visit (INDEPENDENT_AMBULATORY_CARE_PROVIDER_SITE_OTHER): Payer: PPO | Admitting: Family Medicine

## 2023-06-03 ENCOUNTER — Other Ambulatory Visit (HOSPITAL_COMMUNITY)
Admission: RE | Admit: 2023-06-03 | Discharge: 2023-06-03 | Disposition: A | Discharge status: Home / Self Care | Payer: PPO | Source: Ambulatory Visit | Attending: Family Medicine | Admitting: Family Medicine

## 2023-06-03 VITALS — BP 135/73 | HR 66 | Temp 97.3°F | Ht 64.0 in | Wt 149.0 lb

## 2023-06-03 DIAGNOSIS — R399 Unspecified symptoms and signs involving the genitourinary system: Secondary | ICD-10-CM

## 2023-06-03 DIAGNOSIS — N898 Other specified noninflammatory disorders of vagina: Secondary | ICD-10-CM

## 2023-06-03 LAB — URINALYSIS
Bilirubin, UA: NEGATIVE
Glucose, UA: NEGATIVE
Ketones, UA: NEGATIVE
Leukocytes,UA: NEGATIVE
Nitrite, UA: NEGATIVE
RBC, UA: NEGATIVE
Specific Gravity, UA: >1.03 — ABNORMAL HIGH (ref 1.005–1.030)
Urobilinogen, Ur: 0.2 mg/dL (ref 0.2–1.0)
pH, UA: 5.5 (ref 5.0–7.5)

## 2023-06-03 NOTE — Progress Notes (Signed)
BP 135/73   Pulse 66   Temp (!) 97.3 F (36.3 C)   Ht 5\' 4"  (1.626 m)   Wt 149 lb (67.6 kg)   SpO2 97%   BMI 25.58 kg/m    Subjective:   Patient ID: [name], female    DOB: 01/15/1951, 72 y.o.   MRN: [id number]  HPI: [name] is a 72 y.o. female presenting on 06/03/2023 for Urinary Tract Infection   HPI Patient comes in complaining of itching and burning left lower back pain.  She says this all started over a week ago and has been going on and off.  She has struggled with some vaginal discharge issues at times as well and she thinks that is what started it.  She has been having frequency and urgency as well but it did start with the vaginal symptoms.  She has struggled with some different bacterial and yeast infections before.  She says she is having itching burning and discharge now as well is the urinary symptoms.  She denies any fevers or chills.  She denies any lower abdominal pain  Relevant past medical, surgical, family and social history reviewed and updated as indicated. Interim medical history since our last visit reviewed. Allergies and medications reviewed and updated.  Review of Systems  Constitutional:  Negative for chills and fever.  Eyes:  Negative for visual disturbance.  Respiratory:  Negative for chest tightness and shortness of breath.   Cardiovascular:  Negative for chest pain and leg swelling.  Gastrointestinal:  Positive for abdominal pain.  Genitourinary:  Positive for dysuria, frequency, urgency and vaginal discharge. Negative for difficulty urinating, hematuria, vaginal bleeding and vaginal pain.  Musculoskeletal:  Negative for back pain and gait problem.  Skin:  Negative for rash.  Neurological:  Negative for light-headedness and headaches.  Psychiatric/Behavioral:  Negative for agitation and behavioral problems.   All other systems reviewed and are negative.   Per HPI unless specifically indicated above   Allergies as of 06/03/2023        Reactions   Glipizide Itching   Pravachol [pravastatin Sodium] Other (See Comments)   Stomach pain and constipation        Medication List        Accurate as of June 03, 2023 11:53 AM. If you have any questions, ask your nurse or doctor.          Bystolic 5 MG tablet Generic drug: nebivolol TAKE (1) TABLET TWICE A DAY.   calcium citrate-vitamin D 315-200 MG-UNIT tablet Commonly known as: CITRACAL+D Take 1 tablet by mouth daily.   carboxymethylcellulose 0.5 % Soln Commonly known as: REFRESH PLUS Place 1 drop into both eyes daily as needed (dry eyes).   clopidogrel 75 MG tablet Commonly known as: PLAVIX Take 1 tablet (75 mg total) by mouth daily.   COLLAGEN PO Take 0.5 Scoops by mouth daily.   diclofenac Sodium 1 % Gel Commonly known as: Voltaren Apply 4 g topically 4 (four) times daily.   metFORMIN 500 MG tablet Commonly known as: GLUCOPHAGE Take 1 tablet (500 mg total) by mouth daily with supper.   ONE TOUCH ULTRA 2 w/Device Kit 1 Device by Does not apply route daily.   OneTouch Delica Lancets 33G Misc CHECK BLOOD SUGAR up to 4 times DAILY OR AS DIRECTED.E11.9   OneTouch Ultra test strip Generic drug: glucose blood CHECK BLOOD SUGAR up to 4 times DAILY OR AS DIRECTED E11.9   pantoprazole 40 MG tablet Commonly known  as: PROTONIX Take 1 tablet (40 mg total) by mouth daily.   Repatha SureClick 140 MG/ML Soaj Generic drug: Evolocumab Inject 140 mg into the skin every 14 (fourteen) days.   sertraline 50 MG tablet Commonly known as: ZOLOFT TAKE 1 TABLET DAILY         Objective:   BP 135/73   Pulse 66   Temp (!) 97.3 F (36.3 C)   Ht 5\' 4"  (1.626 m)   Wt 149 lb (67.6 kg)   SpO2 97%   BMI 25.58 kg/m   Wt Readings from Last 3 Encounters:  06/03/23 149 lb (67.6 kg)  03/28/23 150 lb 6.4 oz (68.2 kg)  02/19/23 146 lb (66.2 kg)    Physical Exam Vitals and nursing note reviewed.  Constitutional:      General: She is not in acute  distress.    Appearance: She is well-developed. She is not diaphoretic.  Eyes:     Conjunctiva/sclera: Conjunctivae normal.  Abdominal:     General: Abdomen is flat.     Palpations: Abdomen is soft.     Tenderness: There is no abdominal tenderness.  Genitourinary:    Comments: Declines exam Skin:    General: Skin is warm and dry.     Findings: No rash.  Neurological:     Mental Status: She is alert and oriented to person, place, and time.     Coordination: Coordination normal.  Psychiatric:        Behavior: Behavior normal.     Urinalysis, 2+ protein, otherwise clear  Assessment & Plan:   Problem List Items Addressed This Visit   None Visit Diagnoses     UTI symptoms    -  Primary   Relevant Orders   Urine Culture   Urinalysis   Vaginal discharge       Relevant Orders   Cervicovaginal ancillary only       Patient is going to self swab. Sounds more like vaginal issues urinary issues because the urine was pretty clean.  Will await culture as well. Follow up plan: Return if symptoms worsen or fail to improve.  Counseling provided for all of the vaccine components Orders Placed This Encounter  Procedures   Urine Culture   Urinalysis    Arville Care, MD [facility] 06/03/2023, 11:53 AM

## 2023-06-04 LAB — CERVICOVAGINAL ANCILLARY ONLY
Bacterial Vaginitis (gardnerella): NEGATIVE
Candida Glabrata: NEGATIVE
Candida Vaginitis: POSITIVE — AB
Comment: NEGATIVE
Comment: NEGATIVE
Comment: NEGATIVE

## 2023-06-04 LAB — URINE CULTURE: Organism ID, Bacteria: NO GROWTH

## 2023-06-05 ENCOUNTER — Other Ambulatory Visit: Payer: Self-pay | Admitting: Family Medicine

## 2023-06-05 ENCOUNTER — Other Ambulatory Visit: Payer: Self-pay

## 2023-06-05 DIAGNOSIS — E1169 Type 2 diabetes mellitus with other specified complication: Secondary | ICD-10-CM

## 2023-06-05 MED ORDER — FLUCONAZOLE 150 MG PO TABS: 150.0000 mg | ORAL_TABLET | Freq: Once | ORAL | 0 refills | Status: AC

## 2023-06-12 ENCOUNTER — Telehealth: Payer: Self-pay | Admitting: Internal Medicine

## 2023-06-12 ENCOUNTER — Other Ambulatory Visit: Payer: Self-pay | Admitting: Internal Medicine

## 2023-06-12 DIAGNOSIS — I10 Essential (primary) hypertension: Secondary | ICD-10-CM

## 2023-06-12 MED ORDER — BYSTOLIC 5 MG PO TABS
ORAL_TABLET | ORAL | 2 refills | Status: DC
Start: 2023-06-12 — End: 2023-06-12

## 2023-06-12 MED ORDER — NEBIVOLOL HCL 5 MG PO TABS: 5.0000 mg | ORAL_TABLET | Freq: Two times a day (BID) | ORAL | 3 refills | Status: DC

## 2023-06-12 NOTE — Telephone Encounter (Signed)
Spoke with pt who does not require name brand and is ok to take generic.  Advised I will send in the appropriate refill.  She was appreciative of the call back and assistance.    RX sent electronically to pharmacy.

## 2023-06-12 NOTE — Telephone Encounter (Signed)
Pt c/o medication issue:  1. Name of Medication: BYSTOLIC 5 MG tablet   2. How are you currently taking this medication (dosage and times per day)?   3. Are you having a reaction (difficulty breathing--STAT)?   4. What is your medication issue? Patient's pharmacy is requesting call back to discuss if this medication is okay to switch to generic, rather than brand that is noted on her prescription. If requesting to stay with name brand, a PA would need to be submitted.

## 2023-06-17 NOTE — Progress Notes (Signed)
Remote pacemaker transmission.   

## 2023-07-25 ENCOUNTER — Telehealth: Payer: PPO

## 2023-07-25 ENCOUNTER — Encounter: Payer: Self-pay | Admitting: *Deleted

## 2023-07-25 ENCOUNTER — Other Ambulatory Visit: Payer: PPO | Admitting: *Deleted

## 2023-07-25 NOTE — Patient Outreach (Signed)
Care Management   Visit Note  07/25/2023 Name: [name] MRN: [id number] DOB: 12-12-1951  Subjective: [name] is a 72 y.o. year old female who is a primary care patient of Raliegh Ip, DO. The Care Management team was consulted for assistance.      Engaged with patient spoke with patient by telephone for follow up.    Goals Addressed             This Visit's Progress    COMPLETED: CCM (DIABETES) EXPECTED OUTCOME:  MONITOR, SELF-MANAGE AND REDUCE SYMPTOMS OF DIABETES       Current Barriers:  Knowledge Deficits related to Diabetes management Chronic Disease Management support and education needs related to Diabetes and diet Pt reports she checks CBG 1-2 x per day, fasting ranges continuing in low 100's,  does not have any random readings to report Pt reports she drinks diet soft drinks (maybe one per day) and water Pt does light exercise such as walking to the park near her house and exercises prescribed by PT Patient reports she has chronic yeast infections but feels at present is cleared up No new concerns reported,  recent AIC 6.2 on 02/19/23  Planned Interventions: Reviewed medications with patient and discussed importance of medication adherence;        Counseled on importance of regular laboratory monitoring as prescribed;        Reviewed scheduled/upcoming provider appointments including: follow up with primary care provider in March;         Advised patient, providing education and rationale, to check cbg twice daily and record        Review of patient status, including review of consultants reports, relevant laboratory and other test results, and medications completed;       Advised patient to discuss any infections, change in health status with provider;      Reinforced importance of exercising Reviewed correlation of elevated blood sugar with infections and not healing Reviewed carbohydrate modified diet Reviewed plan of care with pt including case  closure  Symptom Management: Take medications as prescribed   Attend all scheduled provider appointments Call pharmacy for medication refills 3-7 days in advance of running out of medications Perform all self care activities independently  Perform IADL's (shopping, preparing meals, housekeeping, managing finances) independently Call provider office for new concerns or questions  check blood sugar at prescribed times: twice daily check feet daily for cuts, sores or redness enter blood sugar readings and medication or insulin into daily log take the blood sugar log to all doctor visits take the blood sugar meter to all doctor visits trim toenails straight across drink 6 to 8 glasses of water each day eat fish at least once per week fill half of plate with vegetables limit fast food meals to no more than 1 per week manage portion size read food labels for fat, fiber, carbohydrates and portion size set a realistic goal wash and dry feet carefully every day Continue walking to the park and doing exercises given to you by PT- keep up the good work! Case closure  Follow Up Plan: No further follow up required: case closure           COMPLETED: CCM (HYPERTENSION) EXPECTED OUTCOME: MONITOR, SELF-MANAGE AND REDUCE SYMPTOMS OF HYPERTENSION       Current Barriers:  Knowledge Deficits related to Hypertension management Chronic Disease Management support and education needs related to Hypertension, diet Pt reports she lives alone but has boyfriend that "comes  in and out frequently", pt reports she continues to drive, is independent in all aspect of her care. Pt reports she usually checks blood pressure once daily and states " usually good" Pt reports she does exercises prescribed by physical therapist (per pt - attended outpatient PT sessions due to mini stroke this year)  Pt reports she has all medications and taking as prescribed, no new concerns reported  Planned Interventions: Evaluation  of current treatment plan related to hypertension self management and patient's adherence to plan as established by provider;   Provided education to patient re: stroke prevention, s/s of heart attack and stroke; Reviewed medications with patient and discussed importance of compliance;  Counseled on the importance of exercise goals with target of 150 minutes per week Discussed plans with patient for ongoing care management follow up and provided patient with direct contact information for care management team; Advised patient, providing education and rationale, to monitor blood pressure daily and record, calling PCP for findings outside established parameters;  Reviewed importance of adherence to low sodium diet and reading food labels Reviewed all upcoming scheduled appointments  Symptom Management: Take medications as prescribed   Attend all scheduled provider appointments Call pharmacy for medication refills 3-7 days in advance of running out of medications Perform all self care activities independently  Perform IADL's (shopping, preparing meals, housekeeping, managing finances) independently Call provider office for new concerns or questions  check blood pressure daily write blood pressure results in a log or diary learn about high blood pressure keep a blood pressure log take blood pressure log to all doctor appointments call doctor for signs and symptoms of high blood pressure develop an action plan for high blood pressure keep all doctor appointments take medications for blood pressure exactly as prescribed begin an exercise program report new symptoms to your doctor eat more whole grains, fruits and vegetables, lean meats and healthy fats Follow low sodium diet- read food labels, limit fast food Continue to exercise  Follow Up Plan: No further follow up required: Case closure              Plan: No further follow up required: case closure  Irving Shows [facility], BSN [facility]/ Ambulatory Care Management [phone]

## 2023-07-25 NOTE — Patient Instructions (Signed)
Visit Information  Thank you for taking time to visit with me today. Please don't hesitate to contact me if I can be of assistance to you before our next scheduled telephone appointment.  Following are the goals we discussed today:   Goals Addressed             This Visit's Progress    COMPLETED: CCM (DIABETES) EXPECTED OUTCOME:  MONITOR, SELF-MANAGE AND REDUCE SYMPTOMS OF DIABETES       Current Barriers:  Knowledge Deficits related to Diabetes management Chronic Disease Management support and education needs related to Diabetes and diet Pt reports she checks CBG 1-2 x per day, fasting ranges continuing in low 100's,  does not have any random readings to report Pt reports she drinks diet soft drinks (maybe one per day) and water Pt does light exercise such as walking to the park near her house and exercises prescribed by PT Patient reports she has chronic yeast infections but feels at present is cleared up No new concerns reported,  recent AIC 6.2 on 02/19/23  Planned Interventions: Reviewed medications with patient and discussed importance of medication adherence;        Counseled on importance of regular laboratory monitoring as prescribed;        Reviewed scheduled/upcoming provider appointments including: follow up with primary care provider in March;         Advised patient, providing education and rationale, to check cbg twice daily and record        Review of patient status, including review of consultants reports, relevant laboratory and other test results, and medications completed;       Advised patient to discuss any infections, change in health status with provider;      Reinforced importance of exercising Reviewed correlation of elevated blood sugar with infections and not healing Reviewed carbohydrate modified diet Reviewed plan of care with pt including case closure  Symptom Management: Take medications as prescribed   Attend all scheduled provider appointments Call  pharmacy for medication refills 3-7 days in advance of running out of medications Perform all self care activities independently  Perform IADL's (shopping, preparing meals, housekeeping, managing finances) independently Call provider office for new concerns or questions  check blood sugar at prescribed times: twice daily check feet daily for cuts, sores or redness enter blood sugar readings and medication or insulin into daily log take the blood sugar log to all doctor visits take the blood sugar meter to all doctor visits trim toenails straight across drink 6 to 8 glasses of water each day eat fish at least once per week fill half of plate with vegetables limit fast food meals to no more than 1 per week manage portion size read food labels for fat, fiber, carbohydrates and portion size set a realistic goal wash and dry feet carefully every day Continue walking to the park and doing exercises given to you by PT- keep up the good work! Case closure  Follow Up Plan: No further follow up required: case closure           COMPLETED: CCM (HYPERTENSION) EXPECTED OUTCOME: MONITOR, SELF-MANAGE AND REDUCE SYMPTOMS OF HYPERTENSION       Current Barriers:  Knowledge Deficits related to Hypertension management Chronic Disease Management support and education needs related to Hypertension, diet Pt reports she lives alone but has boyfriend that "comes in and out frequently", pt reports she continues to drive, is independent in all aspect of her care. Pt reports she usually checks  blood pressure once daily and states " usually good" Pt reports she does exercises prescribed by physical therapist (per pt - attended outpatient PT sessions due to mini stroke this year)  Pt reports she has all medications and taking as prescribed, no new concerns reported  Planned Interventions: Evaluation of current treatment plan related to hypertension self management and patient's adherence to plan as established by  provider;   Provided education to patient re: stroke prevention, s/s of heart attack and stroke; Reviewed medications with patient and discussed importance of compliance;  Counseled on the importance of exercise goals with target of 150 minutes per week Discussed plans with patient for ongoing care management follow up and provided patient with direct contact information for care management team; Advised patient, providing education and rationale, to monitor blood pressure daily and record, calling PCP for findings outside established parameters;  Reviewed importance of adherence to low sodium diet and reading food labels Reviewed all upcoming scheduled appointments  Symptom Management: Take medications as prescribed   Attend all scheduled provider appointments Call pharmacy for medication refills 3-7 days in advance of running out of medications Perform all self care activities independently  Perform IADL's (shopping, preparing meals, housekeeping, managing finances) independently Call provider office for new concerns or questions  check blood pressure daily write blood pressure results in a log or diary learn about high blood pressure keep a blood pressure log take blood pressure log to all doctor appointments call doctor for signs and symptoms of high blood pressure develop an action plan for high blood pressure keep all doctor appointments take medications for blood pressure exactly as prescribed begin an exercise program report new symptoms to your doctor eat more whole grains, fruits and vegetables, lean meats and healthy fats Follow low sodium diet- read food labels, limit fast food Continue to exercise  Follow Up Plan: No further follow up required: Case closure              Please call the care guide team at [phone] if you need to cancel or reschedule your appointment.   If you are experiencing a Mental Health or Behavioral Health Crisis or need someone to talk  to, please call the Suicide and Crisis Lifeline: 988 call the [place] National Suicide Prevention Lifeline: 567-510-7837 or TTY: 567-134-0885 TTY 860-666-8377) to talk to a trained counselor call 1-800-273-TALK (toll free, 24 hour hotline) go to [facility] [address], [place] [phone]) call the [facility] Crisis Line: [phone] call 911   Patient verbalizes understanding of instructions and care plan provided today and agrees to view in MyChart. Active MyChart status and patient understanding of how to access instructions and care plan via MyChart confirmed with patient.     No further follow up required: case closure  Irving Shows [facility], BSN [place]/ Ambulatory Care Management [phone]

## 2023-08-08 ENCOUNTER — Other Ambulatory Visit: Payer: Self-pay | Admitting: Family Medicine

## 2023-08-21 ENCOUNTER — Ambulatory Visit (INDEPENDENT_AMBULATORY_CARE_PROVIDER_SITE_OTHER): Payer: PPO

## 2023-08-21 DIAGNOSIS — I495 Sick sinus syndrome: Secondary | ICD-10-CM | POA: Diagnosis not present

## 2023-08-22 LAB — CUP PACEART REMOTE DEVICE CHECK
Battery Impedance: 1342 Ohm
Battery Remaining Longevity: 58 mo
Battery Voltage: 2.77 V
Brady Statistic AP VP Percent: 0 %
Brady Statistic AP VS Percent: 2 %
Brady Statistic AS VP Percent: 0 %
Brady Statistic AS VS Percent: 98 %
Date Time Interrogation Session: 20240904095131
Implantable Lead Connection Status: 753985
Implantable Lead Connection Status: 753985
Implantable Lead Implant Date: 20050914
Implantable Lead Implant Date: 20050914
Implantable Lead Location: 753859
Implantable Lead Location: 753860
Implantable Lead Model: 5076
Implantable Lead Model: 5076
Implantable Pulse Generator Implant Date: 20150605
Lead Channel Impedance Value: 511 Ohm
Lead Channel Impedance Value: 513 Ohm
Lead Channel Pacing Threshold Amplitude: 0.5 V
Lead Channel Pacing Threshold Amplitude: 0.75 V
Lead Channel Pacing Threshold Pulse Width: 0.4 ms
Lead Channel Pacing Threshold Pulse Width: 0.4 ms
Lead Channel Setting Pacing Amplitude: 2 V
Lead Channel Setting Pacing Amplitude: 2.5 V
Lead Channel Setting Pacing Pulse Width: 0.4 ms
Lead Channel Setting Sensing Sensitivity: 4 mV
Zone Setting Status: 755011
Zone Setting Status: 755011

## 2023-08-23 ENCOUNTER — Encounter: Payer: Self-pay | Admitting: Family Medicine

## 2023-08-23 ENCOUNTER — Ambulatory Visit (INDEPENDENT_AMBULATORY_CARE_PROVIDER_SITE_OTHER): Payer: PPO

## 2023-08-23 ENCOUNTER — Ambulatory Visit (INDEPENDENT_AMBULATORY_CARE_PROVIDER_SITE_OTHER): Payer: PPO | Admitting: Family Medicine

## 2023-08-23 VITALS — BP 130/72 | HR 60 | Temp 98.2°F | Ht 64.0 in | Wt 152.0 lb

## 2023-08-23 DIAGNOSIS — E1169 Type 2 diabetes mellitus with other specified complication: Secondary | ICD-10-CM | POA: Diagnosis not present

## 2023-08-23 DIAGNOSIS — E785 Hyperlipidemia, unspecified: Secondary | ICD-10-CM

## 2023-08-23 DIAGNOSIS — G720 Drug-induced myopathy: Secondary | ICD-10-CM | POA: Diagnosis not present

## 2023-08-23 DIAGNOSIS — Z7984 Long term (current) use of oral hypoglycemic drugs: Secondary | ICD-10-CM | POA: Diagnosis not present

## 2023-08-23 DIAGNOSIS — E119 Type 2 diabetes mellitus without complications: Secondary | ICD-10-CM

## 2023-08-23 DIAGNOSIS — E1159 Type 2 diabetes mellitus with other circulatory complications: Secondary | ICD-10-CM | POA: Diagnosis not present

## 2023-08-23 DIAGNOSIS — I152 Hypertension secondary to endocrine disorders: Secondary | ICD-10-CM | POA: Diagnosis not present

## 2023-08-23 LAB — CMP14+EGFR
ALT: 9 IU/L (ref 0–32)
AST: 11 IU/L (ref 0–40)
Albumin: 4.3 g/dL (ref 3.8–4.8)
Alkaline Phosphatase: 81 IU/L (ref 44–121)
BUN/Creatinine Ratio: 22 (ref 12–28)
BUN: 19 mg/dL (ref 8–27)
Bilirubin Total: 0.5 mg/dL (ref 0.0–1.2)
CO2: 25 mmol/L (ref 20–29)
Calcium: 9.4 mg/dL (ref 8.7–10.3)
Chloride: 101 mmol/L (ref 96–106)
Creatinine, Ser: 0.85 mg/dL (ref 0.57–1.00)
Globulin, Total: 2.3 g/dL (ref 1.5–4.5)
Glucose: 107 mg/dL — ABNORMAL HIGH (ref 70–99)
Potassium: 4.4 mmol/L (ref 3.5–5.2)
Sodium: 140 mmol/L (ref 134–144)
Total Protein: 6.6 g/dL (ref 6.0–8.5)
eGFR: 73 mL/min/{1.73_m2} (ref >59)

## 2023-08-23 LAB — BAYER DCA HB A1C WAIVED: HB A1C (BAYER DCA - WAIVED): 6 % — ABNORMAL HIGH (ref 4.8–5.6)

## 2023-08-23 LAB — HM DIABETES EYE EXAM

## 2023-08-23 NOTE — Progress Notes (Signed)
[name] arrived 08/23/2023 and has given verbal consent to obtain images and complete their overdue diabetic retinal screening.  The images have been sent to an ophthalmologist or optometrist for review and interpretation.  Results will be sent back to Raliegh Ip, DO for review.  Patient has been informed they will be contacted when we receive the results via telephone or MyChart

## 2023-08-23 NOTE — Progress Notes (Signed)
Subjective: CC:DM PCP: Raliegh Ip, DO [name] is a 72 y.o. female presenting to clinic today for:  1. Type 2 Diabetes with hypertension, hyperlipidemia:  She reports compliance with her metformin, Repatha, Bystolic.  Hide blood sugar was 159, no blood sugars below 100.  Reports some overactive bladder at nighttime where she has up to 3 episodes of nocturia.  Does not desire to be on any medications for bladder  Diabetes Health Maintenance Due  Topic Date Due   OPHTHALMOLOGY EXAM  01/25/2023   HEMOGLOBIN A1C  08/22/2023   FOOT EXAM  02/19/2024    Last A1c:  Lab Results  Component Value Date   HGBA1C 6.2 (H) 02/19/2023    ROS: No chest pain, shortness of breath but does have chronic left foot swelling and denies any pain surrounding this.  Just wants to know if this is okay to have  ROS: Per HPI  Allergies  Allergen Reactions   Glipizide Itching   Pravachol [Pravastatin Sodium] Other (See Comments)    Stomach pain and constipation    Past Medical History:  Diagnosis Date   Acute CVA (cerebrovascular accident) (HCC) 07/20/2022   Anxiety    Arthritis    of the neck   Colon polyps    Diabetes mellitus without complication (HCC)    Esophageal stricture    Essential hypertension 01/05/2020   GERD (gastroesophageal reflux disease)    Hiatal Hernia   History of uterine cancer    Hyperlipidemia    Hypertension    Occipital stroke (HCC) 02/13/2019   Osteoporosis    Pacemaker    Symptomatic bradycardia    Tingling in extremities 07/20/2022    Current Outpatient Medications:    Blood Glucose Monitoring Suppl (ONE TOUCH ULTRA 2) w/Device KIT, 1 Device by Does not apply route daily., Disp: 1 each, Rfl: 0   calcium citrate-vitamin D (CITRACAL+D) 315-200 MG-UNIT per tablet, Take 1 tablet by mouth daily., Disp: , Rfl:    carboxymethylcellulose (REFRESH PLUS) 0.5 % SOLN, Place 1 drop into both eyes daily as needed (dry eyes)., Disp: , Rfl:    clopidogrel  (PLAVIX) 75 MG tablet, TAKE ONE TABLET BY MOUTH DAILY, Disp: 90 tablet, Rfl: 0   COLLAGEN PO, Take 0.5 Scoops by mouth daily., Disp: , Rfl:    diclofenac Sodium (VOLTAREN) 1 % GEL, Apply 4 g topically 4 (four) times daily., Disp: 400 g, Rfl: PRN   Evolocumab (REPATHA SURECLICK) 140 MG/ML SOAJ, INJECT 140MG  EVERY 14 DAYS, Disp: 2 mL, Rfl: 2   glucose blood (ONETOUCH ULTRA) test strip, CHECK BLOOD SUGAR up to 4 times DAILY OR AS DIRECTED E11.9, Disp: 100 strip, Rfl: 2   metFORMIN (GLUCOPHAGE) 500 MG tablet, Take 1 tablet (500 mg total) by mouth daily with supper., Disp: 90 tablet, Rfl: 3   nebivolol (BYSTOLIC) 5 MG tablet, Take 1 tablet (5 mg total) by mouth 2 (two) times daily., Disp: 180 tablet, Rfl: 3   OneTouch Delica Lancets 33G MISC, CHECK BLOOD SUGAR up to 4 times DAILY OR AS DIRECTED.E11.9, Disp: 100 each, Rfl: 2   pantoprazole (PROTONIX) 40 MG tablet, Take 1 tablet (40 mg total) by mouth daily., Disp: 90 tablet, Rfl: 3   sertraline (ZOLOFT) 50 MG tablet, TAKE 1 TABLET DAILY, Disp: 90 tablet, Rfl: 3 Social History   Socioeconomic History   Marital status: Significant Other    Spouse name: Not on file   Number of children: 1   Years of education: GED  Highest education level: GED or equivalent  Occupational History   Occupation: retired    Comment: Scientist, product/process development  Tobacco Use   Smoking status: Never   Smokeless tobacco: Never  Vaping Use   Vaping status: Never Used  Substance and Sexual Activity   Alcohol use: No    Comment: may have a social drink rarely   Drug use: No   Sexual activity: Not Currently  Other Topics Concern   Not on file  Social History Narrative   Lives with sig other   Social Determinants of Health   Financial Resource Strain: Low Risk  (12/13/2022)   Overall Financial Resource Strain (CARDIA)    Difficulty of Paying Living Expenses: Not very hard  Food Insecurity: No Food Insecurity (12/13/2022)   Hunger Vital Sign    Worried About Running Out of  Food in the Last Year: Never true    Ran Out of Food in the Last Year: Never true  Transportation Needs: No Transportation Needs (11/12/2022)   PRAPARE - Administrator, Civil Service (Medical): No    Lack of Transportation (Non-Medical): No  Physical Activity: Insufficiently Active (12/13/2022)   Exercise Vital Sign    Days of Exercise per Week: 7 days    Minutes of Exercise per Session: 10 min  Stress: No Stress Concern Present (12/13/2022)   [employer] of Occupational Health - Occupational Stress Questionnaire    Feeling of Stress : Not at all  Social Connections: Moderately Integrated (12/13/2022)   Social Connection and Isolation Panel [NHANES]    Frequency of Communication with Friends and Family: More than three times a week    Frequency of Social Gatherings with Friends and Family: Once a week    Attends Religious Services: More than 4 times per year    Active Member of [employer] or Organizations: No    Attends Banker Meetings: Never    Marital Status: Living with partner  Recent Concern: Social Connections - Moderately Isolated (11/12/2022)   Social Connection and Isolation Panel [NHANES]    Frequency of Communication with Friends and Family: More than three times a week    Frequency of Social Gatherings with Friends and Family: More than three times a week    Attends Religious Services: Never    Database administrator or Organizations: No    Attends Banker Meetings: Never    Marital Status: Living with partner  Intimate Partner Violence: Not At Risk (12/13/2022)   Humiliation, Afraid, Rape, and Kick questionnaire    Fear of Current or Ex-Partner: No    Emotionally Abused: No    Physically Abused: No    Sexually Abused: No   Family History  Problem Relation Age of Onset   Heart disease Mother    Heart attack Mother    Heart disease Father    Heart attack Father    Cancer Sister        breast   Breast cancer Sister    COPD  Sister    Alcohol abuse Brother    Healthy Daughter     Objective: Office vital signs reviewed. BP 130/72   Pulse 60   Temp 98.2 F (36.8 C)   Ht 5\' 4"  (1.626 m)   Wt 152 lb (68.9 kg)   SpO2 95%   BMI 26.09 kg/m   Physical Examination:  General: Awake, alert, well nourished, No acute distress HEENT: Sclera white.  Moist mucous membranes Cardio: regular rate and rhythm,  S1S2 heard, soft systolic murmurs appreciated Pulm: clear to auscultation bilaterally, no wheezes, rhonchi or rales; normal work of breathing on room air MSK: Normal gait and station.  Mild swelling of the left foot.  I feel this to be more arthritic in nature.  No induration.  No tenderness.  No erythema.    Assessment/ Plan: 72 y.o. female   Diabetes mellitus treated with oral medication (HCC) - Plan: Bayer DCA Hb A1c Waived, CMP14+EGFR  Hyperlipidemia associated with type 2 diabetes mellitus (HCC) w/ goal LDL <70 - Plan: CMP14+EGFR  Drug-induced myopathy  Hypertension associated with diabetes (HCC) - Plan: CMP14+EGFR   A1c demonstrates controlled type 2 diabetes.  No changes needed.  Retinal exam will be performed later this afternoon  Not yet due for fasting lipid.  Continue Repatha  Blood pressure was controlled upon recheck.  No changes  Raliegh Ip, DO [facility] [phone]

## 2023-09-03 ENCOUNTER — Other Ambulatory Visit: Payer: Self-pay | Admitting: Family Medicine

## 2023-09-03 DIAGNOSIS — E1169 Type 2 diabetes mellitus with other specified complication: Secondary | ICD-10-CM

## 2023-09-04 NOTE — Progress Notes (Signed)
Remote pacemaker transmission.   

## 2023-09-26 ENCOUNTER — Telehealth: Payer: Self-pay | Admitting: Family Medicine

## 2023-09-26 DIAGNOSIS — E119 Type 2 diabetes mellitus without complications: Secondary | ICD-10-CM

## 2023-09-26 DIAGNOSIS — I152 Hypertension secondary to endocrine disorders: Secondary | ICD-10-CM

## 2023-09-26 DIAGNOSIS — E1169 Type 2 diabetes mellitus with other specified complication: Secondary | ICD-10-CM

## 2023-09-26 NOTE — Telephone Encounter (Signed)
Orders Placed This Encounter  Procedures   CMP14+EGFR   Lipid Panel   Bayer DCA Hb A1c Waived   TSH   Microalbumin / creatinine urine ratio

## 2023-11-06 ENCOUNTER — Other Ambulatory Visit: Payer: Self-pay | Admitting: Family Medicine

## 2023-11-06 DIAGNOSIS — E1169 Type 2 diabetes mellitus with other specified complication: Secondary | ICD-10-CM

## 2023-11-20 ENCOUNTER — Ambulatory Visit (INDEPENDENT_AMBULATORY_CARE_PROVIDER_SITE_OTHER): Payer: PPO

## 2023-11-20 DIAGNOSIS — I495 Sick sinus syndrome: Secondary | ICD-10-CM

## 2023-11-24 LAB — CUP PACEART REMOTE DEVICE CHECK
Battery Impedance: 1392 Ohm
Battery Remaining Longevity: 57 mo
Battery Voltage: 2.76 V
Brady Statistic AP VP Percent: 0 %
Brady Statistic AP VS Percent: 2 %
Brady Statistic AS VP Percent: 0 %
Brady Statistic AS VS Percent: 98 %
Date Time Interrogation Session: 20241206130242
Implantable Lead Connection Status: 753985
Implantable Lead Connection Status: 753985
Implantable Lead Implant Date: 20050914
Implantable Lead Implant Date: 20050914
Implantable Lead Location: 753859
Implantable Lead Location: 753860
Implantable Lead Model: 5076
Implantable Lead Model: 5076
Implantable Pulse Generator Implant Date: 20150605
Lead Channel Impedance Value: 517 Ohm
Lead Channel Impedance Value: 538 Ohm
Lead Channel Pacing Threshold Amplitude: 0.5 V
Lead Channel Pacing Threshold Amplitude: 0.75 V
Lead Channel Pacing Threshold Pulse Width: 0.4 ms
Lead Channel Pacing Threshold Pulse Width: 0.4 ms
Lead Channel Setting Pacing Amplitude: 2 V
Lead Channel Setting Pacing Amplitude: 2.5 V
Lead Channel Setting Pacing Pulse Width: 0.4 ms
Lead Channel Setting Sensing Sensitivity: 4 mV
Zone Setting Status: 755011
Zone Setting Status: 755011

## 2023-12-04 ENCOUNTER — Other Ambulatory Visit: Payer: Self-pay | Admitting: Family Medicine

## 2023-12-04 DIAGNOSIS — F419 Anxiety disorder, unspecified: Secondary | ICD-10-CM

## 2023-12-19 ENCOUNTER — Other Ambulatory Visit (HOSPITAL_COMMUNITY): Payer: Self-pay

## 2023-12-23 ENCOUNTER — Ambulatory Visit: Payer: Self-pay | Admitting: Family Medicine

## 2023-12-23 NOTE — Telephone Encounter (Signed)
 Copied from CRM [id number]. Topic: Clinical - Red Word Triage >> Dec 23, 2023 10:53 AM Curlee DEL wrote: [facility] that prompted transfer to Nurse Triage: Patient reports reflux and gas - she has been experiencing some chest and back pain and the sensation of her food coming back up when she bends over. She called to discuss changing her Protonix  medication.  Chief Complaint: Reflux Symptoms: Reflux, heartburn, and back pain Frequency: Ongoing Pertinent Negatives: Patient denies NV and SOB Disposition: [] ED /[] Urgent Care (no appt availability in office) / [x] Appointment(In office/virtual)/ []  [facility]/ [] Home Care/ [] Refused Recommended Disposition /[] [facility]/ []  Follow-up with PCP Additional Notes: Patient called in complaining that her reflux symptoms have returned. Patient was prescribed and has been taking Protonix  for years. Patient states that she feels like the Protonix  is no longer working, as her reflux symptoms have returned. Patient reports heartburn and back pain. Patient denies NV and SOB. Patient states that she still has an 8 day supply of Protonix  left. Scheduled an office appointment on 1/8 for evaluation. Advised patient to call back for worsening symptoms. Patient complied.   Reason for Disposition  Prescription request for new medicine (not a refill)  Answer Assessment - Initial Assessment Questions 1. NAME of MEDICINE: What medicine(s) are you calling about?     Protonix - patient states  she started years ago  2. QUESTION: What is your question? (e.g., double dose of medicine, side effect)     Patient feels that the medication is no longer working and would like to discuss a new medication  3. PRESCRIBER: Who prescribed the medicine? Reason: if prescribed by specialist, call should be referred to that group.     Norene Fielding, MD  4. SYMPTOMS: Do you have any symptoms? If Yes, ask: What symptoms are you having?  How bad are the  symptoms (e.g., mild, moderate, severe)     Reflux, heartburn, back pain, denies NV and SOB  Protocols used: Medication Question Call-A-AH

## 2023-12-24 NOTE — Progress Notes (Signed)
 Acute Office Visit  Subjective:     Patient ID: [name], female    DOB: 10/03/1951, 73 y.o.   MRN: [id number]  Chief Complaint  Patient presents with   Gastroesophageal Reflux    Feels like protonix  is no longer working   HPI [name] is a 73 year old female present today December 25, 2023 for an acute visit concern for PPI not working GERD: Paitent complains of heartburn. I feel like my reflux medication is not working, I have been having coughing spell for last 2-3 weeks.  She reports increase consumption for citrus food spaghetti with red tomatoes sauce, spicy food.  She reports dealing with spell of cough mostly at night and early in the morning This has been associated with belching, heartburn, and nocturnal burning.  She denies chest pain and difficulty swallowing. Symptoms have been present for 3 weeks. She denies dysphagia.  She has not lost weight. She denies melena, hematochezia, hematemesis, and coffee ground emesis. Medical therapy in the past has included proton pump inhibitors. She had esophagus stretch twice , advise her to take small bite   Relevant past medical, surgical, family, and social history reviewed and updated as indicated.  Allergies and medications reviewed and updated.  Active Ambulatory Problems    Diagnosis Date Noted   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   GERD (gastroesophageal reflux disease) 11/25/2019   Arthritis    History of uterine cancer    Hyperlipidemia associated with type 2 diabetes mellitus (HCC) w/ goal LDL <70    Hypertension associated with diabetes (HCC)    Osteopenia    Anxiety 11/25/2019   Colon polyps 11/25/2019   Esophageal stricture    Pacemaker    Vitamin D  deficiency 01/21/2015   Type 2 diabetes mellitus without complication, without long-term current use of insulin  (HCC) 01/12/2016   BMI 29.0-29.9,adult 10/29/2016   Thyroid  nodule 02/24/2019   Difficult airway for intubation 02/24/2019   History of lacunar  cerebrovascular accident (CVA) 07/24/2022   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  HENT:  Negative for ear pain, sinus pain and sore throat.   Respiratory:  Negative for cough, shortness of breath and wheezing.   Cardiovascular:  Negative for chest pain and leg swelling.  Gastrointestinal:  Positive for heartburn. Negative for constipation, diarrhea, melena, nausea and vomiting.       Cough mostly at night , and in the morning  Genitourinary:  Negative for hematuria and urgency.  Skin:  Negative for itching and rash.  Neurological:  Negative for dizziness and headaches.   Negative unless indicated in HPI    Objective:    BP (!) 140/76   Pulse 60   Temp (!) 97.3 F (36.3 C) (Temporal)   Ht 5' 4 (1.626 m)   Wt 153 lb 3.2 oz (69.5 kg)   SpO2 98%   BMI 26.30 kg/m  BP Readings from Last 3 Encounters:  12/25/23 (!) 140/76  08/23/23  130/72  06/03/23 135/73   Wt Readings from Last 3 Encounters:  12/25/23 153 lb 3.2 oz (69.5 kg)  08/23/23 152 lb (68.9 kg)  06/03/23 149 lb (67.6 kg)      Physical Exam Vitals and nursing note reviewed.  Constitutional:      Appearance: Normal appearance. She is overweight.  HENT:     Head: Normocephalic and atraumatic.     Nose: Nose normal.     Mouth/Throat:     Mouth: Mucous membranes are moist.  Eyes:     General: No scleral icterus.    Extraocular Movements:  Extraocular movements intact.     Conjunctiva/sclera: Conjunctivae normal.     Pupils: Pupils are equal, round, and reactive to light.  Neck:     Vascular: No carotid bruit.  Cardiovascular:     Rate and Rhythm: Normal rate and regular rhythm.  Pulmonary:     Effort: Pulmonary effort is normal.     Breath sounds: Normal breath sounds.  Abdominal:     General: Bowel sounds are normal.     Palpations: Abdomen is soft.  Musculoskeletal:        General: Normal range of motion.     Cervical back: Normal range of motion and neck supple. No rigidity or tenderness.     Right lower leg: No edema.     Left lower leg: No edema.  Lymphadenopathy:     Cervical: No cervical adenopathy.  Skin:    General: Skin is warm and dry.     Findings: No rash.  Neurological:     Mental Status: She is alert and oriented to person, place, and time.     Gait: Gait is intact.     Comments: Slow and steady  Psychiatric:        Mood and Affect: Mood normal.        Behavior: Behavior normal.        Thought Content: Thought content normal.        Judgment: Judgment normal.     No results found for any visits on 12/25/23.      Assessment & Plan:  Gastroesophageal reflux disease without esophagitis -     Pantoprazole  Sodium; Take 1 tablet (40 mg total) by mouth daily.  Dispense: 90 tablet; Refill: 0   [name] is a 73 year old Caucasian female seen today for GERD, no acute distress GERD: No medication changes today will work on Life style modification, better food choices, small bites, and taking PPI 1hr before or 1 hr after eating. No food 1-2 hrs before bed.  Keep head elevated at night.  Pantoprazole  40 mg refilled today If no improvement may consider referral to GI for scope  Encourage healthy lifestyle choices, including diet (rich in fruits, vegetables, and lean proteins, and low in salt and simple carbohydrates) and exercise (at least 30 minutes of moderate physical activity daily).     The above  assessment and management plan was discussed with the patient. The patient verbalized understanding of and has agreed to the management plan. Patient is aware to call the clinic if they develop any new symptoms or if symptoms persist or worsen. Patient is aware when to return to the clinic for a follow-up visit. Patient educated on when it is appropriate to go to the emergency department.  Return if symptoms worsen or fail to improve.  Cuthbert Turton [place] Thompson, DNP [facility] [address] [place], [place] [address] [phone]  Note: This  document was prepared by [employer] and any errors that results from this process are unintentional.

## 2023-12-25 ENCOUNTER — Encounter: Payer: Self-pay | Admitting: Nurse Practitioner

## 2023-12-25 ENCOUNTER — Ambulatory Visit: Payer: PPO | Admitting: Nurse Practitioner

## 2023-12-25 VITALS — BP 140/76 | HR 60 | Temp 97.3°F | Ht 64.0 in | Wt 153.2 lb

## 2023-12-25 DIAGNOSIS — K219 Gastro-esophageal reflux disease without esophagitis: Secondary | ICD-10-CM

## 2023-12-25 MED ORDER — PANTOPRAZOLE SODIUM 40 MG PO TBEC: 40.0000 mg | DELAYED_RELEASE_TABLET | Freq: Every day | ORAL | 0 refills | Status: DC

## 2023-12-27 ENCOUNTER — Ambulatory Visit (INDEPENDENT_AMBULATORY_CARE_PROVIDER_SITE_OTHER): Payer: PPO

## 2023-12-27 VITALS — Ht 64.0 in | Wt 153.0 lb

## 2023-12-27 DIAGNOSIS — Z1231 Encounter for screening mammogram for malignant neoplasm of breast: Secondary | ICD-10-CM

## 2023-12-27 DIAGNOSIS — Z0000 Encounter for general adult medical examination without abnormal findings: Secondary | ICD-10-CM

## 2023-12-27 NOTE — Progress Notes (Signed)
 Subjective:   [name] is a 73 y.o. female who presents for Medicare Annual (Subsequent) preventive examination.  Visit Complete: Virtual I connected with  [name] on 12/27/23 by a audio enabled telemedicine application and verified that I am speaking with the correct person using two identifiers.  Patient Location: Home  Provider Location: Home Office  This patient declined Interactive audio and video telecommunications. Therefore the visit was completed with audio only.  I discussed the limitations of evaluation and management by telemedicine. The patient expressed understanding and agreed to proceed.  Vital Signs: Because this visit was a virtual/telehealth visit, some criteria may be missing or patient reported. Any vitals not documented were not able to be obtained and vitals that have been documented are patient reported.  Cardiac Risk Factors include: advanced age (>73men, >4 women);diabetes mellitus;dyslipidemia;hypertension;sedentary lifestyle     Objective:    Today's Vitals   12/27/23 1305  Weight: 153 lb (69.4 kg)  Height: 5' 4 (1.626 m)   Body mass index is 26.26 kg/m.     12/27/2023    1:58 PM 12/13/2022   10:39 AM 11/12/2022   12:53 PM 08/27/2022    4:15 PM 07/20/2022    6:00 PM 07/20/2022    9:13 AM 11/13/2021   11:36 AM  Advanced Directives  Does Patient Have a Medical Advance Directive? No Yes Yes Yes No No Yes  Type of Advance Directive  Living will;Out of facility DNR (pink MOST or yellow form) Healthcare Power of [place];Living will    Healthcare Power of [place];Living will  Does patient want to make changes to medical advance directive?   No - Patient declined      Copy of Healthcare Power of Attorney in Chart?  No - copy requested No - copy requested    No - copy requested  Would patient like information on creating a medical advance directive? Yes (MAU/Ambulatory/Procedural Areas - Information given)    No - Patient declined No - Patient  declined     Current Medications (verified) Outpatient Encounter Medications as of 12/27/2023  Medication Sig   Blood Glucose Monitoring Suppl (ONE TOUCH ULTRA 2) w/Device KIT 1 Device by Does not apply route daily.   calcium  citrate-vitamin D  (CITRACAL+D) 315-200 MG-UNIT per tablet Take 1 tablet by mouth daily.   carboxymethylcellulose (REFRESH PLUS) 0.5 % SOLN Place 1 drop into both eyes daily as needed (dry eyes).   clopidogrel  (PLAVIX ) 75 MG tablet TAKE ONE TABLET BY MOUTH DAILY   COLLAGEN PO Take 0.5 Scoops by mouth daily.   diclofenac  Sodium (VOLTAREN ) 1 % GEL Apply 4 g topically 4 (four) times daily.   Evolocumab  (REPATHA  SURECLICK) 140 MG/ML SOAJ INJECT 140MG  EVERY 14 DAYS   glucose blood (ONETOUCH ULTRA) test strip CHECK BLOOD SUGAR up to 4 times DAILY OR AS DIRECTED E11.9   metFORMIN  (GLUCOPHAGE ) 500 MG tablet TAKE ONE TABLET DAILY WITH SUPPER   nebivolol  (BYSTOLIC ) 5 MG tablet Take 1 tablet (5 mg total) by mouth 2 (two) times daily.   OneTouch Delica Lancets 33G MISC CHECK BLOOD SUGAR up to 4 times DAILY OR AS DIRECTED.E11.9   pantoprazole  (PROTONIX ) 40 MG tablet Take 1 tablet (40 mg total) by mouth daily.   sertraline  (ZOLOFT ) 50 MG tablet TAKE ONE TABLET DAILY   No facility-administered encounter medications on file as of 12/27/2023.    Allergies (verified) Glipizide  and Pravachol  [pravastatin  sodium]   History: Past Medical History:  Diagnosis Date   Acute CVA (cerebrovascular  accident) (HCC) 07/20/2022   Anxiety    Arthritis    of the neck   Colon polyps    Diabetes mellitus without complication (HCC)    Esophageal stricture    Essential hypertension 01/05/2020   GERD (gastroesophageal reflux disease)    Hiatal Hernia   History of uterine cancer    Hyperlipidemia    Hypertension    Occipital stroke (HCC) 02/13/2019   Osteoporosis    Pacemaker    Symptomatic bradycardia    Tingling in extremities 07/20/2022   Past Surgical History:  Procedure Laterality Date    APPENDECTOMY     CHOLECYSTECTOMY     LAPAROSCOPIC TOTAL HYSTERECTOMY     LYMPH NODE DISSECTION     bilateral pelvic and right-sided periaortic   PACEMAKER GENERATOR CHANGE N/A 05/21/2014   Procedure: PACEMAKER GENERATOR CHANGE;  Surgeon: Danelle LELON [place], MD;  Location[facility] CATH LAB;  Service: Cardiovascular;  Laterality: N/A;   status post pacemaker     metronic [id number]   TRANSTHORACIC ECHOCARDIOGRAM  08/30/04   TUBAL LIGATION     Family History  Problem Relation Age of Onset   Heart disease Mother    Heart attack Mother    Heart disease Father    Heart attack Father    Cancer Sister        breast   Breast cancer Sister    COPD Sister    Alcohol abuse Brother    Healthy Daughter    Social History   Socioeconomic History   Marital status: Significant Other    Spouse name: Not on file   Number of children: 1   Years of education: GED   Highest education level: GED or equivalent  Occupational History   Occupation: retired    Comment: scientist, product/process development  Tobacco Use   Smoking status: Never   Smokeless tobacco: Never  Vaping Use   Vaping status: Never Used  Substance and Sexual Activity   Alcohol use: No    Comment: may have a social drink rarely   Drug use: No   Sexual activity: Not Currently  Other Topics Concern   Not on file  Social History Narrative   Lives with sig other   Social Drivers of Health   Financial Resource Strain: Low Risk  (12/27/2023)   Overall Financial Resource Strain (CARDIA)    Difficulty of Paying Living Expenses: Not hard at all  Food Insecurity: No Food Insecurity (12/27/2023)   Hunger Vital Sign    Worried About Running Out of Food in the Last Year: Never true    Ran Out of Food in the Last Year: Never true  Transportation Needs: No Transportation Needs (12/27/2023)   PRAPARE - Administrator, Civil Service (Medical): No    Lack of Transportation (Non-Medical): No  Physical Activity: Insufficiently Active (12/27/2023)    Exercise Vital Sign    Days of Exercise per Week: 3 days    Minutes of Exercise per Session: 30 min  Stress: No Stress Concern Present (12/27/2023)   [employer] of Occupational Health - Occupational Stress Questionnaire    Feeling of Stress : Not at all  Social Connections: Moderately Integrated (12/27/2023)   Social Connection and Isolation Panel [NHANES]    Frequency of Communication with Friends and Family: More than three times a week    Frequency of Social Gatherings with Friends and Family: Three times a week    Attends Religious Services: 1 to 4 times per year  Active Member of Clubs or Organizations: No    Attends Banker Meetings: Never    Marital Status: Living with partner    Tobacco Counseling Counseling given: Not Answered   Clinical Intake:  Pre-visit preparation completed: Yes  Pain : No/denies pain     Diabetes: No  How often do you need to have someone help you when you read instructions, pamphlets, or other written materials from your doctor or pharmacy?: 1 - Never  Interpreter Needed?: No  Information entered by :: Charmaine Bloodgood LPN   Activities of Daily Living    12/27/2023    1:57 PM  In your present state of health, do you have any difficulty performing the following activities:  Hearing? 0  Vision? 0  Difficulty concentrating or making decisions? 0  Walking or climbing stairs? 0  Dressing or bathing? 0  Doing errands, shopping? 0  Preparing Food and eating ? N  Using the Toilet? N  In the past six months, have you accidently leaked urine? N  Do you have problems with loss of bowel control? N  Managing your Medications? N  Managing your Finances? N  Housekeeping or managing your Housekeeping? N    Patient Care Team: Jolinda Norene HERO, DO as PCP - General (Family Medicine) Waddell Danelle ORN, MD as Consulting Physician (Cardiology) Ladora Ross Lacy Phebe, MD as Referring Physician (Optometry)  Indicate any recent Medical  Services you may have received from other than [facility] providers in the past year (date may be approximate).     Assessment:   This is a routine wellness examination for [name].  Hearing/Vision screen Hearing Screening - Comments:: Denies hearing difficulties   Vision Screening - Comments:: No vision problems; will schedule routine eye exam soon     Goals Addressed             This Visit's Progress    COMPLETED: Exercise 150 minutes per week (moderate activity)       COMPLETED: Patient Stated       12/13/2022 AWV Goal: Exercise for General Health  Patient will verbalize understanding of the benefits of increased physical activity: Exercising regularly is important. It will improve your overall fitness, flexibility, and endurance. Regular exercise also will improve your overall health. It can help you control your weight, reduce stress, and improve your bone density. Over the next year, patient will increase physical activity as tolerated with a goal of at least 150 minutes of moderate physical activity per week.  You can tell that you are exercising at a moderate intensity if your heart starts beating faster and you start breathing faster but can still hold a conversation. Moderate-intensity exercise ideas include: Walking 1 mile (1.6 km) in about 15 minutes Biking Hiking Golfing Dancing Water aerobics Patient will verbalize understanding of everyday activities that increase physical activity by providing examples like the following: Yard work, such as: Insurance Underwriter Gardening Washing windows or floors Patient will be able to explain general safety guidelines for exercising:  Before you start a new exercise program, talk with your health care provider. Do not exercise so much that you hurt yourself, feel dizzy, or get very short of breath. Wear comfortable clothes and wear shoes with good  support. Drink plenty of water while you exercise to prevent dehydration or heat stroke. Work out until your breathing and your heartbeat get faster.       Depression  Screen    12/27/2023    1:56 PM 06/03/2023   11:17 AM 02/19/2023   10:10 AM 12/13/2022   10:41 AM 11/12/2022   12:49 PM 10/24/2022   11:08 AM 08/06/2022    3:02 PM  PHQ 2/9 Scores  PHQ - 2 Score 0 0 0 0 0 0 0  PHQ- 9 Score  0 0    4    Fall Risk    12/27/2023    1:57 PM 08/23/2023    9:33 AM 06/03/2023   11:17 AM 12/13/2022   10:41 AM 11/12/2022    1:05 PM  Fall Risk   Falls in the past year? 0 0 0 1 1  Number falls in past yr: 0 0  0 0  Injury with Fall? 0 0  0 0  Risk for fall due to : No Fall Risks No Fall Risks  History of fall(s)   Follow up Falls prevention discussed;Education provided;Falls evaluation completed Education provided  Falls evaluation completed     MEDICARE RISK AT HOME: Medicare Risk at Home Any stairs in or around the home?: No If so, are there any without handrails?: No Home free of loose throw rugs in walkways, pet beds, electrical cords, etc?: Yes Adequate lighting in your home to reduce risk of falls?: Yes Life alert?: No Use of a cane, walker or w/c?: No Grab bars in the bathroom?: Yes Shower chair or bench in shower?: No Elevated toilet seat or a handicapped toilet?: Yes  TIMED UP AND GO:  Was the test performed?  No    Cognitive Function:    08/02/2017    1:46 PM  MMSE - Mini Mental State Exam  Orientation to time 5  Orientation to Place 5  Registration 3  Attention/ Calculation 5  Recall 3  Language- name 2 objects 2  Language- repeat 1  Language- follow 3 step command 3  Language- read & follow direction 1  Write a sentence 1  Copy design 1  Total score 30        12/27/2023    1:57 PM 12/13/2022   10:45 AM 11/13/2021   11:25 AM 03/24/2020    2:56 PM 03/24/2020    2:42 PM  6CIT Screen  What Year? 0 points 0 points 0 points 0 points 0 points  What month? 0  points 0 points 0 points 0 points 0 points  What time? 0 points 0 points 0 points 0 points 0 points  Count back from 20 0 points 0 points 0 points 0 points   Months in reverse 0 points 0 points 0 points 0 points   Repeat phrase 0 points 0 points 4 points 0 points   Total Score 0 points 0 points 4 points 0 points     Immunizations Immunization History  Administered Date(s) Administered   Fluad Quad(high Dose 65+) 09/07/2019, 11/06/2021, 10/24/2022   Influenza,inj,Quad PF,6+ Mos 10/29/2016, 01/03/2018, 10/08/2018   Moderna SARS-COV2 Booster Vaccination 01/18/2021   Moderna Sars-Covid-2 Vaccination 02/25/2020, 03/29/2020   Pneumococcal Conjugate-13 08/02/2017   Pneumococcal Polysaccharide-23 08/04/2018   Tdap 10/30/2011   Zoster Recombinant(Shingrix ) 02/07/2022, 05/09/2022    TDAP status: Due, Education has been provided regarding the importance of this vaccine. Advised may receive this vaccine at local pharmacy or Health Dept. Aware to provide a copy of the vaccination record if obtained from local pharmacy or Health Dept. Verbalized acceptance and understanding.  Flu Vaccine status: Declined, Education has been provided regarding the importance of this vaccine  but patient still declined. Advised may receive this vaccine at local pharmacy or Health Dept. Aware to provide a copy of the vaccination record if obtained from local pharmacy or Health Dept. Verbalized acceptance and understanding.  Pneumococcal vaccine status: Up to date  Covid-19 vaccine status: Information provided on how to obtain vaccines.   Qualifies for Shingles Vaccine? Yes   Zostavax completed No   Shingrix  Completed?: Yes  Screening Tests Health Maintenance  Topic Date Due   COVID-19 Vaccine (4 - 2024-25 season) 08/18/2023   Colonoscopy  09/23/2023   Diabetic kidney evaluation - Urine ACR  10/25/2023   INFLUENZA VACCINE  03/16/2024 (Originally 07/18/2023)   FOOT EXAM  02/19/2024   HEMOGLOBIN A1C  02/20/2024    MAMMOGRAM  03/05/2024   Diabetic kidney evaluation - eGFR measurement  08/22/2024   OPHTHALMOLOGY EXAM  08/22/2024   DEXA SCAN  10/24/2024   Medicare Annual Wellness (AWV)  12/26/2024   Pneumonia Vaccine 75+ Years old  Completed   Hepatitis C Screening  Completed   Zoster Vaccines- Shingrix   Completed   HPV VACCINES  Aged Out   DTaP/Tdap/Td  Discontinued    Health Maintenance  Health Maintenance Due  Topic Date Due   COVID-19 Vaccine (4 - 2024-25 season) 08/18/2023   Colonoscopy  09/23/2023   Diabetic kidney evaluation - Urine ACR  10/25/2023    Colorectal cancer screening: Referral to GI placed and patient scheduled for February. Pt aware the office will call re: appt.  Mammogram status: Completed 03/06/23. Repeat every year (ordered and scheduled for 2025)  Bone Density status: Completed 10/24/22. Results reflect: Bone density results: OSTEOPENIA. Repeat every 2 years.  Lung Cancer Screening: (Low Dose CT Chest recommended if Age 38-80 years, 20 pack-year currently smoking OR have quit w/in 15years.) does not qualify.   Lung Cancer Screening Referral: n/a  Additional Screening:  Hepatitis C Screening: does qualify; Completed 11/17/15  Vision Screening: Recommended annual ophthalmology exams for early detection of glaucoma and other disorders of the eye. Is the patient up to date with their annual eye exam?  Yes  Who is the provider or what is the name of the office in which the patient attends annual eye exams? In office exam  If pt is not established with a provider, would they like to be referred to a provider to establish care? No .   Dental Screening: Recommended annual dental exams for proper oral hygiene  Diabetic Foot Exam: Diabetic Foot Exam: Completed 02/19/23  Community Resource Referral / Chronic Care Management: CRR required this visit?  No   CCM required this visit?  No     Plan:     I have personally reviewed and noted the following in the patient's  chart:   Medical and social history Use of alcohol, tobacco or illicit drugs  Current medications and supplements including opioid prescriptions. Patient is not currently taking opioid prescriptions. Functional ability and status Nutritional status Physical activity Advanced directives List of other physicians Hospitalizations, surgeries, and ER visits in previous 12 months Vitals Screenings to include cognitive, depression, and falls Referrals and appointments  In addition, I have reviewed and discussed with patient certain preventive protocols, quality metrics, and best practice recommendations. A written personalized care plan for preventive services as well as general preventive health recommendations were provided to patient.     Lavelle Pfeiffer [place], [place]   [id number]/7974   After Visit Summary: (MyChart) Due to this being a telephonic visit, the after visit summary with patients  personalized plan was offered to patient via MyChart   Nurse Notes: Msg sent to pharmacist for assistance with Repatha 

## 2023-12-27 NOTE — Patient Instructions (Signed)
 [name] , Thank you for taking time to come for your Medicare Wellness Visit. I appreciate your ongoing commitment to your health goals. Please review the following plan we discussed and let me know if I can assist you in the future.   Referrals/Orders/Follow-Ups/Clinician Recommendations: Aim for 30 minutes of exercise or brisk walking, 6-8 glasses of water, and 5 servings of fruits and vegetables each day.  This is a list of the screening recommended for you and due dates:  Health Maintenance  Topic Date Due   COVID-19 Vaccine (4 - 2024-25 season) 08/18/2023   Colon Cancer Screening  09/23/2023   Yearly kidney health urinalysis for diabetes  10/25/2023   Flu Shot  03/16/2024*   Complete foot exam   02/19/2024   Hemoglobin A1C  02/20/2024   Mammogram  03/05/2024   Yearly kidney function blood test for diabetes  08/22/2024   Eye exam for diabetics  08/22/2024   DEXA scan (bone density measurement)  10/24/2024   Medicare Annual Wellness Visit  12/26/2024   Pneumonia Vaccine  Completed   Hepatitis C Screening  Completed   Zoster (Shingles) Vaccine  Completed   HPV Vaccine  Aged Out   DTaP/Tdap/Td vaccine  Discontinued  *Topic was postponed. The date shown is not the original due date.    Advanced directives: (ACP Link)Information on Advanced Care Planning can be found at Joy  Secretary of [facility] Advance Health Care Directives Advance Health Care Directives ([email or web address]   Next Medicare Annual Wellness Visit scheduled for next year: Yes

## 2024-01-06 ENCOUNTER — Other Ambulatory Visit (HOSPITAL_COMMUNITY): Payer: Self-pay

## 2024-01-06 ENCOUNTER — Telehealth: Payer: Self-pay

## 2024-01-06 NOTE — Telephone Encounter (Signed)
*  Primary  Pharmacy Patient Advocate Encounter   Received notification from Fax that prior authorization for Repatha SureClick 140MG /ML auto-injectors  is required/requested.   Insurance verification completed.   The patient is insured through [facility] ADVANTAGE/RX ADVANCE .   Per test claim: PA required; PA submitted to above mentioned insurance via CoverMyMeds Key/confirmation #/EOC [id number] Status is pending

## 2024-01-08 ENCOUNTER — Other Ambulatory Visit (HOSPITAL_COMMUNITY): Payer: Self-pay

## 2024-01-08 NOTE — Telephone Encounter (Signed)
Pharmacy Patient Advocate Encounter  Received notification from [facility] [employer]ANTAGE/RX ADVANCE that Prior Authorization for Repatha has been APPROVED from 01/06/2024 to 01/05/2025. Unable to obtain price due to refill too soon rejection, last fill date 01/08/2024 next available fill date02/11/2024

## 2024-01-08 NOTE — Telephone Encounter (Signed)
 A user error has taken place: encounter opened in error, closed for administrative reasons.

## 2024-01-28 DIAGNOSIS — Z09 Encounter for follow-up examination after completed treatment for conditions other than malignant neoplasm: Secondary | ICD-10-CM | POA: Diagnosis not present

## 2024-01-28 DIAGNOSIS — D122 Benign neoplasm of ascending colon: Secondary | ICD-10-CM | POA: Diagnosis not present

## 2024-01-28 DIAGNOSIS — D128 Benign neoplasm of rectum: Secondary | ICD-10-CM | POA: Diagnosis not present

## 2024-01-28 DIAGNOSIS — Z860100 Personal history of colon polyps, unspecified: Secondary | ICD-10-CM | POA: Diagnosis not present

## 2024-01-28 LAB — HM COLONOSCOPY

## 2024-01-30 DIAGNOSIS — D128 Benign neoplasm of rectum: Secondary | ICD-10-CM | POA: Diagnosis not present

## 2024-01-30 DIAGNOSIS — D122 Benign neoplasm of ascending colon: Secondary | ICD-10-CM | POA: Diagnosis not present

## 2024-02-03 ENCOUNTER — Emergency Department (HOSPITAL_COMMUNITY): Payer: PPO

## 2024-02-03 ENCOUNTER — Ambulatory Visit: Payer: Self-pay | Admitting: Family Medicine

## 2024-02-03 ENCOUNTER — Emergency Department (HOSPITAL_COMMUNITY)
Admission: EM | Admit: 2024-02-03 | Discharge: 2024-02-03 | Disposition: A | Discharge status: Home / Self Care | Payer: PPO | Attending: Emergency Medicine | Admitting: Emergency Medicine

## 2024-02-03 ENCOUNTER — Encounter (HOSPITAL_COMMUNITY): Payer: Self-pay | Admitting: Emergency Medicine

## 2024-02-03 ENCOUNTER — Other Ambulatory Visit: Payer: Self-pay

## 2024-02-03 DIAGNOSIS — S199XXA Unspecified injury of neck, initial encounter: Secondary | ICD-10-CM | POA: Diagnosis not present

## 2024-02-03 DIAGNOSIS — S0990XA Unspecified injury of head, initial encounter: Secondary | ICD-10-CM | POA: Diagnosis not present

## 2024-02-03 DIAGNOSIS — M50223 Other cervical disc displacement at C6-C7 level: Secondary | ICD-10-CM | POA: Diagnosis not present

## 2024-02-03 DIAGNOSIS — Z7984 Long term (current) use of oral hypoglycemic drugs: Secondary | ICD-10-CM | POA: Insufficient documentation

## 2024-02-03 DIAGNOSIS — I69398 Other sequelae of cerebral infarction: Secondary | ICD-10-CM | POA: Diagnosis not present

## 2024-02-03 DIAGNOSIS — I10 Essential (primary) hypertension: Secondary | ICD-10-CM | POA: Insufficient documentation

## 2024-02-03 DIAGNOSIS — R531 Weakness: Secondary | ICD-10-CM | POA: Insufficient documentation

## 2024-02-03 DIAGNOSIS — Z7902 Long term (current) use of antithrombotics/antiplatelets: Secondary | ICD-10-CM | POA: Insufficient documentation

## 2024-02-03 DIAGNOSIS — E119 Type 2 diabetes mellitus without complications: Secondary | ICD-10-CM | POA: Insufficient documentation

## 2024-02-03 DIAGNOSIS — Z950 Presence of cardiac pacemaker: Secondary | ICD-10-CM | POA: Insufficient documentation

## 2024-02-03 DIAGNOSIS — M47812 Spondylosis without myelopathy or radiculopathy, cervical region: Secondary | ICD-10-CM | POA: Diagnosis not present

## 2024-02-03 LAB — CBG MONITORING, ED: Glucose-Capillary: 153 mg/dL — ABNORMAL HIGH (ref 70–99)

## 2024-02-03 LAB — BASIC METABOLIC PANEL
Anion gap: 9 (ref 5–15)
BUN: 17 mg/dL (ref 8–23)
CO2: 25 mmol/L (ref 22–32)
Calcium: 9 mg/dL (ref 8.9–10.3)
Chloride: 105 mmol/L (ref 98–111)
Creatinine, Ser: 0.88 mg/dL (ref 0.44–1.00)
GFR, Estimated: >60 mL/min (ref >60)
Glucose, Bld: 152 mg/dL — ABNORMAL HIGH (ref 70–99)
Potassium: 3.5 mmol/L (ref 3.5–5.1)
Sodium: 139 mmol/L (ref 135–145)

## 2024-02-03 LAB — CBC
HCT: 40.7 % (ref 36.0–46.0)
Hemoglobin: 13.4 g/dL (ref 12.0–15.0)
MCH: 30.7 pg (ref 26.0–34.0)
MCHC: 32.9 g/dL (ref 30.0–36.0)
MCV: 93.3 fL (ref 80.0–100.0)
Platelets: 181 10*3/uL (ref 150–400)
RBC: 4.36 MIL/uL (ref 3.87–5.11)
RDW: 12.3 % (ref 11.5–15.5)
WBC: 8.4 10*3/uL (ref 4.0–10.5)
nRBC: 0 % (ref 0.0–0.2)

## 2024-02-03 NOTE — ED Triage Notes (Signed)
 PT complains of weakness, chills, Right eye pain and frequent falls since last Tuesday after having colonoscopy. Pt states last Tuesday night fell and hit back of head and neck. Pt is on blood thinners. Denies any LOC.

## 2024-02-03 NOTE — Telephone Encounter (Signed)
 Chief Complaint: Weakness/tired Symptoms: Weakness, confusion, right eye pain,  Frequency: Constant Pertinent Negatives: Patient denies Numbness, vision changes, SOB, chest pain, hemispheric weakness, facial droop, slurred speech. Disposition: [] ED /[x] Urgent Care (no appt availability in office) / [] Appointment(In office/virtual)/ []  [facility]/ [] Home Care/ [] Refused Recommended Disposition /[]  Mobile Bus/ []  Follow-up with PCP Additional Notes: Patient called with complaints of weakness since Tuesday post colonoscopy and right eye pain since Thursday. Patient's partner took over the call as patient was slightly confused to answer triage questions. Patient's partner states that patient has a history of mini stroke and has some of the same symptoms she has in 2023 during her last mini stroke. Patient's partner states that symptoms gradually came on, and that both sides are affected. Patient's partner states that right eye has been off and on painful (moderate) and relieved with motrin, and patient also is experiencing weakness, and increased tiredness with increased sleep. Patient denies new medications, headache, head injury, vision changes, fever, numbness, facial droop, slurred speech, limping, dragging of one leg/foot, chest pain, SOB. Patient eating and drinking normally and able to walk without assistance per partner. Patient's partner advised that patient should be seen within the next four hours at UC due to no availability in office. Patient agreeable and advised by this RN note will be routed to office for PCP review. Patient verbalized understanding   Copied from CRM [id number]. Topic: Clinical - Red Word Triage >> Feb 03, 2024  9:09 AM [name] wrote: Red Word that prompted transfer to Nurse Triage: patient had colonoscopy done and has been has been weak and having chills, right eye has been hurting no fever, fell in bathroom Tuesday night, no emergency assistance or  injuries . Reason for Disposition  [1] Weakness of the face, arm / hand, or leg / foot on one side of the body AND [2] gradual onset (e.g., days to weeks) AND [3] present now  Answer Assessment - Initial Assessment Questions 1. DESCRIPTION: "Describe how you are feeling."     "Every day I'm getting a little better every day 2. SEVERITY: "How bad is it?"  "Can you stand and walk?"   - MILD (0-3): Feels weak or tired, but does not interfere with work, school or normal activities.   - MODERATE (4-7): Able to stand and walk; weakness interferes with work, school, or normal activities.   - SEVERE (8-10): Unable to stand or walk; unable to do usual activities.     Moderate 3. ONSET: "When did these symptoms begin?" (e.g., hours, days, weeks, months)     Tuesday 4. CAUSE: "What do you think is causing the weakness or fatigue?" (e.g., not drinking enough fluids, medical problem, trouble sleeping)     "She was fine Monday until she had her colonoscopy on Tuesday then this started" 5. NEW MEDICINES:  "Have you started on any new medicines recently?" (e.g., opioid pain medicines, benzodiazepines, muscle relaxants, antidepressants, antihistamines, neuroleptics, beta blockers) Denies  Answer Assessment - Initial Assessment Questions 1. SYMPTOM: "What is the main symptom you are concerned about?" (e.g., weakness, numbness)     "Little left sided weakness, I noticed she is holding her left arm a little." 2. ONSET: "When did this start?" (minutes, hours, days; while sleeping)     Thursday "but she had a colonoscopy on Tuesday when her other symptoms started shortly after that night." 3. LAST NORMAL: "When was the last time you (the patient) were normal (no symptoms)?"     "  Before her colonoscopy" (Tuesday) 4. PATTERN "Does this come and go, or has it been constant since it started?"  "Is it present now?"     "Saturday she felt bad all day. She has the weakness and the right eye pain now." 5. CARDIAC  SYMPTOMS: "Have you had any of the following symptoms: chest pain, difficulty breathing, palpitations?"     Denies 6. NEUROLOGIC SYMPTOMS: "Have you had any of the following symptoms: headache, dizziness, vision loss, double vision, changes in speech, unsteady on your feet?"      Denies 7. OTHER SYMPTOMS: "Do you have any other symptoms?"     Weakness, tired, sleeping, right eye pain, confusion "she's been a little confused every since she had a coloscopy. She know's where she is but she just a little confused. I'm with her."  Protocols used: Weakness (Generalized) and Fatigue-A-AH, Neurologic Deficit-A-AH

## 2024-02-03 NOTE — ED Provider Triage Note (Signed)
 Emergency Medicine Provider Triage Evaluation Note  [name] , a 73 y.o. female  was evaluated in triage.  Pt complains of weakness and falls.  On Plavix.  Symptoms started this past Tuesday after colonoscopy.  States she felt generally weak after the procedure.  States she fell multiple times.  Did hit the back of her head and neck one occasion.  Denies chest pain.  States she was feeling generally weak which she believes precipitated he falls. Denies fever or urinary symptoms.  States she is on Plavix for history of prior stroke.  Review of Systems  Positive: See above Negative: See above  Physical Exam  BP (!) 166/74   Pulse 66   Temp 97.9 F (36.6 C)   Resp 14   Ht 5\' 4"  (1.626 m)   Wt 69.4 kg   SpO2 99%   BMI 26.26 kg/m  Gen:   Awake, no distress   Resp:  Normal effort  MSK:   Moves extremities without difficulty  Other:    Medical Decision Making  Medically screening exam initiated at 2:46 PM.  Appropriate orders placed.  [name] was informed that the remainder of the evaluation will be completed by another provider, this initial triage assessment does not replace that evaluation, and the importance of remaining in the ED until their evaluation is complete.  Work up started   Gareth Eagle, PA-C 02/03/24 1448

## 2024-02-03 NOTE — Telephone Encounter (Signed)
 02/03/2024 - present [facility] Emergency Department at [facility]

## 2024-02-03 NOTE — ED Notes (Signed)
CBG [id number] 

## 2024-02-03 NOTE — Discharge Instructions (Addendum)
 We evaluated you for your weakness following your colonoscopy.  Your physical examination and testing in the emergency department is reassuring.  We do not think that you have had a new stroke or a problem related to your recent colonoscopy.  Your weakness could be related to anesthesia or potentially due to the bowel prep for the colonoscopy.  Since your symptoms are improving, we do not think there is any dangerous cause to your symptoms.  Please follow-up closely with your primary care physician.  Please return to the emergency department if you develop any new or worsening symptoms such as weakness on one side of your body, facial droop, visual disturbances, chest pain, difficulty breathing, numbness on one side of your body or the other, trouble swallowing, or any other concerning symptoms.

## 2024-02-03 NOTE — Telephone Encounter (Addendum)
 See previous triage note: pt called in stating she went to UC and the power was out. This RN checked available appt times and no appropriate times available. Provided pt with other UC locations. This RN educated pt on when to call back/seek emergent care. Pt verbalized understanding and agrees to plan and will proceed to UC.   Copied from CRM [id number]. Topic: Appointments - Appointment Scheduling >> Feb 03, 2024 12:51 PM Kristie Cowman wrote: Patient had a colonoscopy last Tuesday and she has been feeling weak, right eye quivering, headache, two falls, chills, but no fever.

## 2024-02-03 NOTE — ED Provider Notes (Signed)
 [place] EMERGENCY DEPARTMENT AT [facility] Provider Note  CSN: [id number] Arrival date & time: 02/03/24 1340  Chief Complaint(s) Weakness  HPI [name] is a 73 y.o. female history of prior stroke presenting to the emergency department with weakness.  Patient reports he had a colonoscopy about 1 week ago.  Since then she was feeling weak, had a few episodes of chills, did have a couple of falls due to feeling weakness.  Denies any focal weakness such as weakness in her arms or legs or one side of her body or the other.  No headaches.  No fevers.  No nausea or vomiting.  No abdominal pain, diarrhea, constipation.  She reports 1 brief episode of right eye pain without visual disturbance or loss of vision, this occurred a couple days ago,, this resolved on its own.  She reports overall her symptoms are significantly improved.  She was reading online and was worried that the eye symptoms she had could be stroke related so she went to an urgent care.  Urgent care sent her here.   Past Medical History Past Medical History:  Diagnosis Date   Acute CVA (cerebrovascular accident) (HCC) 07/20/2022   Anxiety    Arthritis    of the neck   Colon polyps    Diabetes mellitus without complication (HCC)    Esophageal stricture    Essential hypertension 01/05/2020   GERD (gastroesophageal reflux disease)    Hiatal Hernia   History of uterine cancer    Hyperlipidemia    Hypertension    Occipital stroke (HCC) 02/13/2019   Osteoporosis    Pacemaker    Symptomatic bradycardia    Tingling in extremities 07/20/2022   Patient Active Problem List   Diagnosis Date Noted   Sinus node dysfunction (HCC) 03/28/2023   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   History of lacunar cerebrovascular accident (CVA) 07/24/2022   GERD (gastroesophageal reflux disease) 11/25/2019   Anxiety 11/25/2019   Colon polyps 11/25/2019   Thyroid nodule 02/24/2019   Difficult airway for intubation 02/24/2019   BMI  29.0-29.9,adult 10/29/2016   Type 2 diabetes mellitus without complication, without long-term current use of insulin (HCC) 01/12/2016   Vitamin D deficiency 01/21/2015   Arthritis    History of uterine cancer    Hyperlipidemia associated with type 2 diabetes mellitus (HCC) w/ goal LDL <70    Hypertension associated with diabetes (HCC)    Osteopenia    Esophageal stricture    Pacemaker    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   Home Medication(s) Prior to Admission medications   Medication Sig Start Date End Date Taking? Authorizing Provider  Blood Glucose Monitoring Suppl (ONE TOUCH ULTRA 2) w/Device KIT 1 Device by Does not apply route daily. 09/17/17   Johna Sheriff, MD  calcium citrate-vitamin D (CITRACAL+D) 315-200 MG-UNIT per tablet Take 1 tablet by mouth daily.    [provider]  carboxymethylcellulose (REFRESH PLUS) 0.5 % SOLN Place 1 drop into both eyes daily as needed (dry eyes).    [provider]  clopidogrel (PLAVIX) 75 MG tablet TAKE ONE TABLET BY MOUTH DAILY 11/06/23   Delynn Flavin M, DO  COLLAGEN PO Take 0.5 Scoops by mouth daily.    [provider]  diclofenac Sodium (VOLTAREN) 1 % GEL Apply 4 g topically 4 (four) times daily. 08/04/21   Delynn Flavin M, DO  Evolocumab (REPATHA SURECLICK) 140 MG/ML SOAJ INJECT 140MG  EVERY 14 DAYS 09/03/23   Delynn Flavin M, DO  glucose blood (ONETOUCH ULTRA)  test strip CHECK BLOOD SUGAR up to 4 times DAILY OR AS DIRECTED E11.9 10/24/22   Delynn Flavin M, DO  metFORMIN (GLUCOPHAGE) 500 MG tablet TAKE ONE TABLET DAILY WITH SUPPER 11/06/23   Delynn Flavin M, DO  nebivolol (BYSTOLIC) 5 MG tablet Take 1 tablet (5 mg total) by mouth 2 (two) times daily. 06/12/23   Marinus Maw, MD  OneTouch Delica Lancets 33G MISC CHECK BLOOD SUGAR up to 4 times DAILY OR AS DIRECTED.E11.9 10/24/22   Delynn Flavin M, DO  pantoprazole (PROTONIX) 40 MG tablet Take 1 tablet (40 mg total) by mouth daily. 12/25/23   St Vena Austria, NP  sertraline (ZOLOFT) 50 MG tablet TAKE ONE TABLET DAILY 12/04/23   Raliegh Ip, DO                                                                                                                                    Past Surgical History Past Surgical History:  Procedure Laterality Date   APPENDECTOMY     CHOLECYSTECTOMY     LAPAROSCOPIC TOTAL HYSTERECTOMY     LYMPH NODE DISSECTION     bilateral pelvic and right-sided periaortic   PACEMAKER GENERATOR CHANGE N/A 05/21/2014   Procedure: PACEMAKER GENERATOR CHANGE;  Surgeon: Marinus Maw, MD;  Location: [facility] CATH LAB;  Service: Cardiovascular;  Laterality: N/A;   status post pacemaker     metronic [id number]   TRANSTHORACIC ECHOCARDIOGRAM  08/30/04   TUBAL LIGATION     Family History Family History  Problem Relation Age of Onset   Heart disease Mother    Heart attack Mother    Heart disease Father    Heart attack Father    Cancer Sister        breast   Breast cancer Sister    COPD Sister    Alcohol abuse Brother    Healthy Daughter     Social History Social History   Tobacco Use   Smoking status: Never   Smokeless tobacco: Never  Vaping Use   Vaping status: Never Used  Substance Use Topics   Alcohol use: No    Comment: may have a social drink rarely   Drug use: No   Allergies Glipizide and Pravachol [pravastatin sodium]  Review of Systems Review of Systems  All other systems reviewed and are negative.   Physical Exam Vital Signs  I have reviewed the triage vital signs BP (!) 160/66   Pulse 81   Temp 98 F (36.7 C) (Oral)   Resp 18   Ht 5\' 4"  (1.626 m)   Wt 69.4 kg   SpO2 99%   BMI 26.26 kg/m  Physical Exam Vitals and nursing note reviewed.  Constitutional:      General: She is not in acute distress.    Appearance: She is well-developed.  HENT:     Head: Normocephalic and atraumatic.     Mouth/Throat:  Mouth: Mucous membranes are moist.  Eyes:     General:         Right eye: No discharge.        Left eye: No discharge.     Extraocular Movements: Extraocular movements intact.     Conjunctiva/sclera: Conjunctivae normal.     Pupils: Pupils are equal, round, and reactive to light.  Cardiovascular:     Rate and Rhythm: Normal rate and regular rhythm.     Heart sounds: No murmur heard. Pulmonary:     Effort: Pulmonary effort is normal. No respiratory distress.     Breath sounds: Normal breath sounds.  Abdominal:     General: Abdomen is flat.     Palpations: Abdomen is soft.     Tenderness: There is no abdominal tenderness.  Musculoskeletal:        General: No tenderness.     Right lower leg: No edema.     Left lower leg: No edema.     Comments: No midline C, T, L-spine tenderness.  No chest wall tenderness or crepitus.  Full painless range of motion at the bilateral upper extremities including the shoulders, elbows, wrists, hand and fingers, and in the bilateral lower extremities including the hips, knees, ankle, toes.  No focal bony tenderness, injury or deformity.  Skin:    General: Skin is warm and dry.  Neurological:     Mental Status: She is alert.     Comments: Cranial nerves II through XII intact,, visual fields intact, strength 5 out of 5 in the bilateral upper and lower extremities, no sensory deficit to light touch, no dysmetria on finger-nose-finger testing, ambulatory with steady gait.   Psychiatric:        Mood and Affect: Mood normal.        Behavior: Behavior normal.     ED Results and Treatments Labs (all labs ordered are listed, but only abnormal results are displayed) Labs Reviewed  BASIC METABOLIC PANEL - Abnormal; Notable for the following components:      Result Value   Glucose, Bld 152 (*)    All other components within normal limits  CBG MONITORING, ED - Abnormal; Notable for the following components:   Glucose-Capillary 153 (*)    All other components within normal limits  CBC                                                                                                                           Radiology CT Head Wo Contrast Result Date: 02/03/2024 CLINICAL DATA:  Provided history: Polytrauma, blunt. Additional history provided: Frequent falls since last Tuesday, right eye pain, weakness, chills. Patient reports trauma to head and neck resulting from a recent fall. EXAM: CT HEAD WITHOUT CONTRAST CT CERVICAL SPINE WITHOUT CONTRAST TECHNIQUE: Multidetector CT imaging of the head and cervical spine was performed following the standard protocol without intravenous contrast. Multiplanar CT image reconstructions of the cervical spine were also generated. RADIATION DOSE REDUCTION: This  exam was performed according to the departmental dose-optimization program which includes automated exposure control, adjustment of the mA and/or kV according to patient size and/or use of iterative reconstruction technique. COMPARISON:  Head CT 07/21/2022. Cervical spine radiographs 02/18/2020. Thyroid ultrasound 08/09/2022. FINDINGS: CT HEAD FINDINGS Brain: No age-advanced or lobar predominant cerebral atrophy. Unchanged chronic infarcts within the bilateral frontal lobe white matter, cortical/subcortical left occipital lobe, left thalamus and bilateral cerebellar hemispheres. Background patchy and ill-defined hypoattenuation within the cerebral white matter, nonspecific but compatible with mild chronic small vessel ischemic disease. There is no acute intracranial hemorrhage. No acute demarcated cortical infarct. No extra-axial fluid collection. No midline shift. Vascular: No hyperdense vessel.  Atherosclerotic calcifications Skull: No calvarial fracture or aggressive osseous lesion. Sinuses/Orbits: No mass or acute finding within the imaged orbits. No significant paranasal sinus disease at the imaged levels. CT CERVICAL SPINE FINDINGS Alignment: Nonspecific reversal expected cervical lordosis. Slight C2-C3 grade 1 anterolisthesis. 2 mm C3-C4  grade 1 anterolisthesis. 3 mm C4-C5 grade 1 anterolisthesis. 2 mm C5-C6 grade 1 anterolisthesis. Skull base and vertebrae: The basion-dental and atlanto-dental intervals are maintained.No evidence of acute fracture to the cervical spine. Small nonspecific sclerotic focus within the C2 spinous process, unchanged from the prior CTA neck of 07/20/2022 Soft tissues and spinal canal: No prevertebral fluid or swelling. No visible canal hematoma. Disc levels: Cervical spondylosis with multilevel disc space narrowing, disc bulges/central disc protrusion and uncovertebral hypertrophy. Multilevel spinal canal narrowing most notably as follows. At C4-C5, a central disc protrusion effaces the ventral thecal sac and mildly flattens the ventral aspect of the spinal cord. However, the dorsal CSF space appears maintained within the spinal canal. At C6-C7, a right center disc protrusion effaces the thecal sac and mildly flattens the right ventral aspect of the spinal cord. However, the dorsal CSF space appears maintained within the spinal canal. Multilevel neural foraminal narrowing. Ventral osteophytes, most prominent to the right at C5-C6. Upper chest: No consolidation within the imaged lung apices. No visible pneumothorax. Other: Known 3.1 cm right thyroid lobe nodule. This nodule was previously assessed with thyroid ultrasound, most recently on 08/09/2022. Additionally, by report, this nodule was previously biopsied. Please correlate with the prior biopsy results. IMPRESSION: CT head: 1.  No evidence of an acute intracranial abnormality. 2. Chronic infarcts as described and unchanged from the prior head CT of 07/21/2022. 3. Background mild cerebral white matter chronic small vessel ischemic disease. CT cervical spine: 1. No evidence of an acute cervical spine fracture. 2. Nonspecific reversal of the expected cervical lordosis. 3. Grade 1 anterolisthesis at C2-C3, C3-C4, C4-C5 and C5-C6. 4. Cervical spondylosis as described  Electronically Signed   By: Jackey Loge D.O.   On: 02/03/2024 16:47   CT Cervical Spine Wo Contrast Result Date: 02/03/2024 CLINICAL DATA:  Provided history: Polytrauma, blunt. Additional history provided: Frequent falls since last Tuesday, right eye pain, weakness, chills. Patient reports trauma to head and neck resulting from a recent fall. EXAM: CT HEAD WITHOUT CONTRAST CT CERVICAL SPINE WITHOUT CONTRAST TECHNIQUE: Multidetector CT imaging of the head and cervical spine was performed following the standard protocol without intravenous contrast. Multiplanar CT image reconstructions of the cervical spine were also generated. RADIATION DOSE REDUCTION: This exam was performed according to the departmental dose-optimization program which includes automated exposure control, adjustment of the mA and/or kV according to patient size and/or use of iterative reconstruction technique. COMPARISON:  Head CT 07/21/2022. Cervical spine radiographs 02/18/2020. Thyroid ultrasound 08/09/2022. FINDINGS: CT HEAD FINDINGS Brain: No  age-advanced or lobar predominant cerebral atrophy. Unchanged chronic infarcts within the bilateral frontal lobe white matter, cortical/subcortical left occipital lobe, left thalamus and bilateral cerebellar hemispheres. Background patchy and ill-defined hypoattenuation within the cerebral white matter, nonspecific but compatible with mild chronic small vessel ischemic disease. There is no acute intracranial hemorrhage. No acute demarcated cortical infarct. No extra-axial fluid collection. No midline shift. Vascular: No hyperdense vessel.  Atherosclerotic calcifications Skull: No calvarial fracture or aggressive osseous lesion. Sinuses/Orbits: No mass or acute finding within the imaged orbits. No significant paranasal sinus disease at the imaged levels. CT CERVICAL SPINE FINDINGS Alignment: Nonspecific reversal expected cervical lordosis. Slight C2-C3 grade 1 anterolisthesis. 2 mm C3-C4 grade 1  anterolisthesis. 3 mm C4-C5 grade 1 anterolisthesis. 2 mm C5-C6 grade 1 anterolisthesis. Skull base and vertebrae: The basion-dental and atlanto-dental intervals are maintained.No evidence of acute fracture to the cervical spine. Small nonspecific sclerotic focus within the C2 spinous process, unchanged from the prior CTA neck of 07/20/2022 Soft tissues and spinal canal: No prevertebral fluid or swelling. No visible canal hematoma. Disc levels: Cervical spondylosis with multilevel disc space narrowing, disc bulges/central disc protrusion and uncovertebral hypertrophy. Multilevel spinal canal narrowing most notably as follows. At C4-C5, a central disc protrusion effaces the ventral thecal sac and mildly flattens the ventral aspect of the spinal cord. However, the dorsal CSF space appears maintained within the spinal canal. At C6-C7, a right center disc protrusion effaces the thecal sac and mildly flattens the right ventral aspect of the spinal cord. However, the dorsal CSF space appears maintained within the spinal canal. Multilevel neural foraminal narrowing. Ventral osteophytes, most prominent to the right at C5-C6. Upper chest: No consolidation within the imaged lung apices. No visible pneumothorax. Other: Known 3.1 cm right thyroid lobe nodule. This nodule was previously assessed with thyroid ultrasound, most recently on 08/09/2022. Additionally, by report, this nodule was previously biopsied. Please correlate with the prior biopsy results. IMPRESSION: CT head: 1.  No evidence of an acute intracranial abnormality. 2. Chronic infarcts as described and unchanged from the prior head CT of 07/21/2022. 3. Background mild cerebral white matter chronic small vessel ischemic disease. CT cervical spine: 1. No evidence of an acute cervical spine fracture. 2. Nonspecific reversal of the expected cervical lordosis. 3. Grade 1 anterolisthesis at C2-C3, C3-C4, C4-C5 and C5-C6. 4. Cervical spondylosis as described  Electronically Signed   By: Jackey Loge D.O.   On: 02/03/2024 16:47    Pertinent labs & imaging results that were available during my care of the patient were reviewed by me and considered in my medical decision making (see MDM for details).  Medications Ordered in ED Medications - No data to display                                                                                                                                   Procedures Procedures  (including critical care time)  Medical  Decision Making / ED Course   MDM:  73 year old presenting to the emergency department with generalized weakness following colonoscopy.  Patient overall well-appearing, physical examination overall very reassuring.  No evidence of any traumatic injuries on exam.  Neurologic exam is normal.  She reports her symptoms are overall significantly improved since her recent colonoscopy, and she mainly came because she concerned that her symptoms could be related to a stroke.  She denies any focal neurologic symptoms.  She did have a brief episode of right eye pain but this is resolved, she has had no visual disturbances, visual fields are intact.  She did report chills, but has no fevers, abdominal pain, nausea or vomiting, diarrhea, abdominal chest to suggest any intra-abdominal infection, no symptoms of any other potential infection either.  Very low concern for any acute process such as acute stroke, CT negative for any intracranial bleeding, low concern for occult infectious process, laboratory testing without evidence of any toxic or metabolic abnormality such as severe dehydration, anemia or other dangerous process.  Given reassuring physical examination, patient symptoms on the whole much improved from onset, will discharge patient to home. All questions answered. Patient comfortable with plan of discharge. Return precautions discussed with patient and specified on the after visit summary.        Additional history obtained: -Additional history obtained from spouse -External records from outside source obtained and reviewed including: Chart review including previous notes, labs, imaging, consultation notes including prior notes    Lab Tests: -I ordered, reviewed, and interpreted labs.   The pertinent results include:   Labs Reviewed  BASIC METABOLIC PANEL - Abnormal; Notable for the following components:      Result Value   Glucose, Bld 152 (*)    All other components within normal limits  CBG MONITORING, ED - Abnormal; Notable for the following components:   Glucose-Capillary 153 (*)    All other components within normal limits  CBC    Notable for borderline hyperglycemia   EKG   EKG Interpretation Date/Time:  Monday February 03 2024 14:37:29 EST Ventricular Rate:  63 PR Interval:  162 QRS Duration:  74 QT Interval:  448 QTC Calculation: 458 R Axis:   -22  Text Interpretation: Normal sinus rhythm Possible Left atrial enlargement Anterior infarct , age undetermined Abnormal ECG When compared with ECG of 20-Jul-2022 00:19, No significant change since last tracing Confirmed by Alvino Blood ([id number]) on 02/03/2024 5:57:33 PM         Imaging Studies ordered: I ordered imaging studies including CT head and neck On my interpretation imaging demonstrates no traumatic injury, old stroke w/o acute change I independently visualized and interpreted imaging. I agree with the radiologist interpretation   Medicines ordered and prescription drug management: No orders of the defined types were placed in this encounter.   -I have reviewed the patients home medicines and have made adjustments as needed   Co morbidities that complicate the patient evaluation  Past Medical History:  Diagnosis Date   Acute CVA (cerebrovascular accident) (HCC) 07/20/2022   Anxiety    Arthritis    of the neck   Colon polyps    Diabetes mellitus without complication (HCC)     Esophageal stricture    Essential hypertension 01/05/2020   GERD (gastroesophageal reflux disease)    Hiatal Hernia   History of uterine cancer    Hyperlipidemia    Hypertension    Occipital stroke (HCC) 02/13/2019   Osteoporosis    Pacemaker  Symptomatic bradycardia    Tingling in extremities 07/20/2022      Dispostion: Disposition decision including need for hospitalization was considered, and patient discharged from emergency department.    Final Clinical Impression(s) / ED Diagnoses Final diagnoses:  Weakness     This chart was dictated using voice recognition software.  Despite best efforts to proofread,  errors can occur which can change the documentation meaning.    Lonell Grandchild, MD 02/03/24 [phone]

## 2024-02-04 ENCOUNTER — Telehealth: Payer: Self-pay

## 2024-02-04 NOTE — Telephone Encounter (Signed)
 Attempted to call pt no answer left vm - pt needs to be r/s due to office closing early

## 2024-02-05 ENCOUNTER — Ambulatory Visit: Payer: PPO | Admitting: Family Medicine

## 2024-02-10 ENCOUNTER — Ambulatory Visit: Payer: Self-pay | Admitting: Family Medicine

## 2024-02-10 NOTE — Telephone Encounter (Signed)
 Copied from CRM [id number]. Topic: Clinical - Red Word Triage >> Feb 10, 2024  3:44 PM Alessandra Bevels wrote: Red Word that prompted transfer to Nurse Triage: Pt is calling to report that she is anxious that she is worried about hersefl.   Chief Complaint: Anxiety Symptoms: anxiety Frequency: Since the 11th Pertinent Negatives: Patient denies feeling depressed, trouble concentrating, trouble sleeping, trouble breathing, palpitations or fast heartbeat, chest pain, sweating, nausea, or diarrhea Disposition: [] ED /[] Urgent Care (no appt availability in office) / [x] Appointment(In office/virtual)/ []  [facility]/ [] Home Care/ [] Refused Recommended Disposition /[] [facility]/ []  Follow-up with PCP Additional Notes: Patient called and advised that she is having some anxiety.  She states that right now her hands are cold. She states that she had a colonoscopy on the 11th, got weak that night, experienced some more symptoms and she put off going to the Emergency Room until the 17th. Patient states that her PCP was supposed to call her but it was cancelled due to weather last week. Patient wanted to go over her results from her recent ER visit because she became anxious after looking through her MyChart from that visit and seeing Abnormal on a few results. Patient was calm and cooperative and denies any thoughts about hurting herself.  She took a hot shower earlier and felt better after that.  She was given Care Advice as per protocol. Patient states that she has been having some confusion the past 2-3 days as well.  She states that she does her banking online and she left it open. She also had to log into MyChart three different times. She feels like this is related to her anxiety.  Patient denies feeling depressed, trouble concentrating, trouble sleeping, trouble breathing, palpitations or fast heartbeat, chest pain, sweating, nausea, or diarrhea. Patient states that she feels better after  speaking with this RN and is happy to have an appointment set up for this Thursday 02/13/2024 at 9:45 am with Martina Sinner to go over her recent hospital visit and results from that visit.  Patient is also advised that she can call back with any questions she has and also if things get worse to go back to the emergency room or call 911.  Patient verbalized understanding.  Reason for Disposition  MODERATE anxiety (e.g., persistent or frequent anxiety symptoms; interferes with sleep, school, or work)  Answer Assessment - Initial Assessment Questions 1. CONCERN: "Did anything happen that prompted you to call today?"      Pt worried about her health 2. ANXIETY SYMPTOMS: "Can you describe how you (your loved one; patient) have been feeling?" (e.g., tense, restless, panicky, anxious, keyed up, overwhelmed, sense of impending doom).      Anxious about recent health 3. ONSET: "How long have you been feeling this way?" (e.g., hours, days, weeks)     Since the 11th when she fell in the floor--she was checked out for that 4. SEVERITY: "How would you rate the level of anxiety?" (e.g., 0 - 10; or mild, moderate, severe).     Moderate 5. FUNCTIONAL IMPAIRMENT: "How have these feelings affected your ability to do daily activities?" "Have you had more difficulty than usual doing your normal daily activities?" (e.g., getting better, same, worse; self-care, school, work, interactions)     "Interfering somewhat" 6. HISTORY: "Have you felt this way before?" "Have you ever been diagnosed with an anxiety problem in the past?" (e.g., generalized anxiety disorder, panic attacks, PTSD). If Yes, ask: "How  was this problem treated?" (e.g., medicines, counseling, etc.)     "No. I do take Zoloft but I only take 25mg " 7. RISK OF HARM - SUICIDAL IDEATION: "Do you ever have thoughts of hurting or killing yourself?" If Yes, ask:  "Do you have these feelings now?" "Do you have a plan on how you would do this?"      No 8. TREATMENT:  "What has been done so far to treat this anxiety?" (e.g., medicines, relaxation strategies). "What has helped?"     "Today I took a hot shower and that helped until I looked at MyChart" 9. TREATMENT - THERAPIST: "Do you have a counselor or therapist? Name?"     No 10. POTENTIAL TRIGGERS: "Do you drink caffeinated beverages (e.g., coffee, colas, teas), and how much daily?" "Do you drink alcohol or use any drugs?" "Have you started any new medicines recently?"       "Ive been drinking Diet Dr Reino Kent but since all this happened I havent had maybe a couple in the last couple of weeks" 11. PATIENT SUPPORT: "Who is with you now?" "Who do you live with?" "Do you have family or friends who you can talk to?"        Companion with her and she has friends/family she can talk to 26. OTHER SYMPTOMS: "Do you have any other symptoms?" (e.g., feeling depressed, trouble concentrating, trouble sleeping, trouble breathing, palpitations or fast heartbeat, chest pain, sweating, nausea, or diarrhea)       No  Protocols used: Anxiety and Panic Attack-A-AH

## 2024-02-12 NOTE — Progress Notes (Unsigned)
 Established Patient Office Visit  Subjective  Patient ID: [name], female    DOB: 1951-03-13  Age: 73 y.o. MRN: [id number]  No chief complaint on file.   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   History of lacunar cerebrovascular accident (CVA) 07/24/2022   GERD (gastroesophageal reflux disease) 11/25/2019   Anxiety 11/25/2019   Colon polyps 11/25/2019   Thyroid nodule 02/24/2019   Difficult airway for intubation 02/24/2019   BMI 29.0-29.9,adult 10/29/2016   Type 2 diabetes mellitus without complication, without long-term current use of insulin (HCC) 01/12/2016   Vitamin D deficiency 01/21/2015   Arthritis    History of uterine cancer    Hyperlipidemia associated with type 2 diabetes mellitus (HCC) w/ goal LDL <70    Hypertension associated with diabetes (HCC)    Osteopenia    Esophageal stricture    Pacemaker    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   Past Medical History:  Diagnosis Date   Acute CVA (cerebrovascular accident) (HCC) 07/20/2022   Anxiety    Arthritis    of the neck   Colon polyps    Diabetes mellitus without complication (HCC)    Esophageal stricture    Essential hypertension 01/05/2020   GERD (gastroesophageal reflux disease)    Hiatal Hernia   History of uterine cancer    Hyperlipidemia    Hypertension    Occipital stroke (HCC) 02/13/2019   Osteoporosis    Pacemaker    Symptomatic bradycardia    Tingling in extremities 07/20/2022   Past Surgical History:  Procedure Laterality Date   APPENDECTOMY     CHOLECYSTECTOMY     LAPAROSCOPIC TOTAL HYSTERECTOMY     LYMPH NODE DISSECTION     bilateral pelvic and right-sided periaortic   PACEMAKER GENERATOR CHANGE N/A 05/21/2014   Procedure: PACEMAKER GENERATOR CHANGE;  Surgeon: Marinus Maw, MD;  Location: [facility] CATH LAB;  Service: Cardiovascular;  Laterality: N/A;   status post pacemaker     metronic [id number]    TRANSTHORACIC ECHOCARDIOGRAM  08/30/04   TUBAL LIGATION     Social History   Tobacco Use   Smoking status: Never   Smokeless tobacco: Never  Vaping Use   Vaping status: Never Used  Substance Use Topics   Alcohol use: No    Comment: may have a social drink rarely   Drug use: No   Social History   Socioeconomic History   Marital status: Significant Other    Spouse name: Not on file   Number of children: 1   Years of education: GED   Highest education level: GED or equivalent  Occupational History   Occupation: retired    Comment: Scientist, product/process development  Tobacco Use   Smoking status: Never   Smokeless tobacco: Never  Vaping Use   Vaping status: Never Used  Substance and Sexual Activity   Alcohol use: No    Comment: may have a social drink rarely   Drug use: No   Sexual activity: Not Currently  Other Topics Concern   Not on file  Social History Narrative   Lives with sig other   Social Drivers of Health   Financial Resource Strain: Low Risk  (12/27/2023)   Overall Financial Resource Strain (CARDIA)    Difficulty of Paying Living Expenses: Not hard at all  Food Insecurity: No Food Insecurity (12/27/2023)   Hunger Vital Sign    Worried About Running Out of  Food in the Last Year: Never true    Ran Out of Food in the Last Year: Never true  Transportation Needs: No Transportation Needs (12/27/2023)   PRAPARE - Administrator, Civil Service (Medical): No    Lack of Transportation (Non-Medical): No  Physical Activity: Insufficiently Active (12/27/2023)   Exercise Vital Sign    Days of Exercise per Week: 3 days    Minutes of Exercise per Session: 30 min  Stress: No Stress Concern Present (12/27/2023)   [employer] of Occupational Health - Occupational Stress Questionnaire    Feeling of Stress : Not at all  Social Connections: Moderately Integrated (12/27/2023)   Social Connection and Isolation Panel [NHANES]    Frequency of Communication with Friends and  Family: More than three times a week    Frequency of Social Gatherings with Friends and Family: Three times a week    Attends Religious Services: 1 to 4 times per year    Active Member of Clubs or Organizations: No    Attends Banker Meetings: Never    Marital Status: Living with partner  Intimate Partner Violence: Not At Risk (12/27/2023)   Humiliation, Afraid, Rape, and Kick questionnaire    Fear of Current or Ex-Partner: No    Emotionally Abused: No    Physically Abused: No    Sexually Abused: No   Family Status  Relation Name Status   Mother  Deceased at age 40   Father  Deceased at age 42   Sister  Deceased at age 79   Sister  Deceased at age 51   Brother  Deceased   Brother  Deceased   MGM  Deceased   MGF  Deceased   PGM  Deceased   PGF  Deceased   Daughter  Alive  No partnership data on file   Family History  Problem Relation Age of Onset   Heart disease Mother    Heart attack Mother    Heart disease Father    Heart attack Father    Cancer Sister        breast   Breast cancer Sister    COPD Sister    Alcohol abuse Brother    Healthy Daughter    Allergies  Allergen Reactions   Glipizide Itching   Pravachol [Pravastatin Sodium] Other (See Comments)    Stomach pain and constipation       ROS Negative unless indicated in HPI   Objective:     There were no vitals taken for this visit. BP Readings from Last 3 Encounters:  02/03/24 (!) 160/66  12/25/23 (!) 140/76  08/23/23 130/72   Wt Readings from Last 3 Encounters:  02/03/24 153 lb (69.4 kg)  12/27/23 153 lb (69.4 kg)  12/25/23 153 lb 3.2 oz (69.5 kg)      Physical Exam   No results found for any visits on 02/13/24.  Last CBC Lab Results  Component Value Date   WBC 8.4 02/03/2024   HGB 13.4 02/03/2024   HCT 40.7 02/03/2024   MCV 93.3 02/03/2024   MCH 30.7 02/03/2024   RDW 12.3 02/03/2024   PLT 181 02/03/2024   Last metabolic panel Lab Results  Component Value Date    GLUCOSE 152 (H) 02/03/2024   NA 139 02/03/2024   K 3.5 02/03/2024   CL 105 02/03/2024   CO2 25 02/03/2024   BUN 17 02/03/2024   CREATININE 0.88 02/03/2024   GFRNONAA >60 02/03/2024   CALCIUM 9.0 02/03/2024  PROT 6.6 08/23/2023   ALBUMIN 4.3 08/23/2023   LABGLOB 2.3 08/23/2023   AGRATIO 1.8 07/24/2022   BILITOT 0.5 08/23/2023   ALKPHOS 81 08/23/2023   AST 11 08/23/2023   ALT 9 08/23/2023   ANIONGAP 9 02/03/2024   Last lipids Lab Results  Component Value Date   CHOL 151 02/19/2023   HDL 54 02/19/2023   LDLCALC 71 02/19/2023   LDLDIRECT 67 09/07/2019   TRIG 149 02/19/2023   CHOLHDL 2.8 02/19/2023   Last hemoglobin A1c Lab Results  Component Value Date   HGBA1C 6.0 (H) 08/23/2023   Last thyroid functions Lab Results  Component Value Date   TSH 1.530 02/19/2023        Assessment & Plan:  There are no diagnoses linked to this encounter.  No follow-ups on file.    Arrie Aran Santa Lighter, [facility] [address] [place], [place] [address] [phone]    Note: This document was prepared by [employer] voice dictation technology and any errors that results from this process are unintentional.

## 2024-02-13 ENCOUNTER — Other Ambulatory Visit: Payer: Self-pay | Admitting: Family Medicine

## 2024-02-13 ENCOUNTER — Encounter: Payer: Self-pay | Admitting: Nurse Practitioner

## 2024-02-13 ENCOUNTER — Ambulatory Visit (INDEPENDENT_AMBULATORY_CARE_PROVIDER_SITE_OTHER): Payer: PPO | Admitting: Nurse Practitioner

## 2024-02-13 VITALS — BP 139/77 | HR 71 | Temp 97.9°F | Ht 64.0 in | Wt 151.0 lb

## 2024-02-13 DIAGNOSIS — E1169 Type 2 diabetes mellitus with other specified complication: Secondary | ICD-10-CM

## 2024-02-13 DIAGNOSIS — R531 Weakness: Secondary | ICD-10-CM | POA: Diagnosis not present

## 2024-02-13 DIAGNOSIS — Z09 Encounter for follow-up examination after completed treatment for conditions other than malignant neoplasm: Secondary | ICD-10-CM | POA: Insufficient documentation

## 2024-02-19 ENCOUNTER — Ambulatory Visit (INDEPENDENT_AMBULATORY_CARE_PROVIDER_SITE_OTHER): Payer: PPO

## 2024-02-19 DIAGNOSIS — I495 Sick sinus syndrome: Secondary | ICD-10-CM

## 2024-02-21 ENCOUNTER — Ambulatory Visit: Payer: PPO | Admitting: Family Medicine

## 2024-02-22 ENCOUNTER — Encounter: Payer: Self-pay | Admitting: Internal Medicine

## 2024-02-22 LAB — CUP PACEART REMOTE DEVICE CHECK
Battery Impedance: 1477 Ohm
Battery Remaining Longevity: 54 mo
Battery Voltage: 2.76 V
Brady Statistic AP VP Percent: 0 %
Brady Statistic AP VS Percent: 2 %
Brady Statistic AS VP Percent: 0 %
Brady Statistic AS VS Percent: 98 %
Date Time Interrogation Session: 20250305094758
Implantable Lead Connection Status: 753985
Implantable Lead Connection Status: 753985
Implantable Lead Implant Date: 20050914
Implantable Lead Implant Date: 20050914
Implantable Lead Location: 753859
Implantable Lead Location: 753860
Implantable Lead Model: 5076
Implantable Lead Model: 5076
Implantable Pulse Generator Implant Date: 20150605
Lead Channel Impedance Value: 496 Ohm
Lead Channel Impedance Value: 523 Ohm
Lead Channel Pacing Threshold Amplitude: 0.375 V
Lead Channel Pacing Threshold Amplitude: 0.75 V
Lead Channel Pacing Threshold Pulse Width: 0.4 ms
Lead Channel Pacing Threshold Pulse Width: 0.4 ms
Lead Channel Setting Pacing Amplitude: 2 V
Lead Channel Setting Pacing Amplitude: 2.5 V
Lead Channel Setting Pacing Pulse Width: 0.4 ms
Lead Channel Setting Sensing Sensitivity: 4 mV
Zone Setting Status: 755011
Zone Setting Status: 755011

## 2024-03-04 LAB — OPHTHALMOLOGY REPORT-SCANNED

## 2024-03-11 ENCOUNTER — Ambulatory Visit
Admission: RE | Admit: 2024-03-11 | Discharge: 2024-03-11 | Disposition: A | Discharge status: Home / Self Care | Payer: PPO | Source: Ambulatory Visit | Attending: Family Medicine | Admitting: Family Medicine

## 2024-03-11 DIAGNOSIS — Z1231 Encounter for screening mammogram for malignant neoplasm of breast: Secondary | ICD-10-CM

## 2024-03-16 ENCOUNTER — Other Ambulatory Visit: Payer: PPO

## 2024-03-16 DIAGNOSIS — E1159 Type 2 diabetes mellitus with other circulatory complications: Secondary | ICD-10-CM

## 2024-03-16 DIAGNOSIS — E1169 Type 2 diabetes mellitus with other specified complication: Secondary | ICD-10-CM | POA: Diagnosis not present

## 2024-03-16 DIAGNOSIS — Z7984 Long term (current) use of oral hypoglycemic drugs: Secondary | ICD-10-CM | POA: Diagnosis not present

## 2024-03-16 DIAGNOSIS — E119 Type 2 diabetes mellitus without complications: Secondary | ICD-10-CM | POA: Diagnosis not present

## 2024-03-16 DIAGNOSIS — E785 Hyperlipidemia, unspecified: Secondary | ICD-10-CM | POA: Diagnosis not present

## 2024-03-16 DIAGNOSIS — I152 Hypertension secondary to endocrine disorders: Secondary | ICD-10-CM | POA: Diagnosis not present

## 2024-03-16 LAB — BAYER DCA HB A1C WAIVED: HB A1C (BAYER DCA - WAIVED): 5.9 % — ABNORMAL HIGH (ref 4.8–5.6)

## 2024-03-17 ENCOUNTER — Encounter: Payer: Self-pay | Admitting: Family Medicine

## 2024-03-17 ENCOUNTER — Ambulatory Visit (INDEPENDENT_AMBULATORY_CARE_PROVIDER_SITE_OTHER): Payer: PPO | Admitting: Family Medicine

## 2024-03-17 VITALS — BP 124/65 | HR 63 | Temp 98.5°F | Ht 64.0 in | Wt 147.6 lb

## 2024-03-17 DIAGNOSIS — Z0001 Encounter for general adult medical examination with abnormal findings: Secondary | ICD-10-CM | POA: Diagnosis not present

## 2024-03-17 DIAGNOSIS — I152 Hypertension secondary to endocrine disorders: Secondary | ICD-10-CM

## 2024-03-17 DIAGNOSIS — Z7984 Long term (current) use of oral hypoglycemic drugs: Secondary | ICD-10-CM

## 2024-03-17 DIAGNOSIS — E785 Hyperlipidemia, unspecified: Secondary | ICD-10-CM | POA: Diagnosis not present

## 2024-03-17 DIAGNOSIS — K219 Gastro-esophageal reflux disease without esophagitis: Secondary | ICD-10-CM

## 2024-03-17 DIAGNOSIS — Z23 Encounter for immunization: Secondary | ICD-10-CM

## 2024-03-17 DIAGNOSIS — E1159 Type 2 diabetes mellitus with other circulatory complications: Secondary | ICD-10-CM

## 2024-03-17 DIAGNOSIS — Z8673 Personal history of transient ischemic attack (TIA), and cerebral infarction without residual deficits: Secondary | ICD-10-CM | POA: Diagnosis not present

## 2024-03-17 DIAGNOSIS — E119 Type 2 diabetes mellitus without complications: Secondary | ICD-10-CM

## 2024-03-17 DIAGNOSIS — E1169 Type 2 diabetes mellitus with other specified complication: Secondary | ICD-10-CM | POA: Diagnosis not present

## 2024-03-17 DIAGNOSIS — Z0000 Encounter for general adult medical examination without abnormal findings: Secondary | ICD-10-CM

## 2024-03-17 DIAGNOSIS — F419 Anxiety disorder, unspecified: Secondary | ICD-10-CM | POA: Diagnosis not present

## 2024-03-17 LAB — CMP14+EGFR
ALT: 9 IU/L (ref 0–32)
AST: 11 IU/L (ref 0–40)
Albumin: 4.5 g/dL (ref 3.8–4.8)
Alkaline Phosphatase: 82 IU/L (ref 44–121)
BUN/Creatinine Ratio: 19 (ref 12–28)
BUN: 16 mg/dL (ref 8–27)
Bilirubin Total: 0.6 mg/dL (ref 0.0–1.2)
CO2: 26 mmol/L (ref 20–29)
Calcium: 10.1 mg/dL (ref 8.7–10.3)
Chloride: 101 mmol/L (ref 96–106)
Creatinine, Ser: 0.84 mg/dL (ref 0.57–1.00)
Globulin, Total: 2.2 g/dL (ref 1.5–4.5)
Glucose: 121 mg/dL — ABNORMAL HIGH (ref 70–99)
Potassium: 4.2 mmol/L (ref 3.5–5.2)
Sodium: 141 mmol/L (ref 134–144)
Total Protein: 6.7 g/dL (ref 6.0–8.5)
eGFR: 74 mL/min/{1.73_m2} (ref >59)

## 2024-03-17 LAB — MICROALBUMIN / CREATININE URINE RATIO
Creatinine, Urine: 298.8 mg/dL
Microalb/Creat Ratio: 81 mg/g{creat} — ABNORMAL HIGH (ref 0–29)
Microalbumin, Urine: 242.3 ug/mL

## 2024-03-17 LAB — LIPID PANEL
Chol/HDL Ratio: 2.7 ratio (ref 0.0–4.4)
Cholesterol, Total: 155 mg/dL (ref 100–199)
HDL: 58 mg/dL (ref >39)
LDL Chol Calc (NIH): 67 mg/dL (ref 0–99)
Triglycerides: 184 mg/dL — ABNORMAL HIGH (ref 0–149)
VLDL Cholesterol Cal: 30 mg/dL (ref 5–40)

## 2024-03-17 LAB — TSH: TSH: 1.42 u[IU]/mL (ref 0.450–4.500)

## 2024-03-17 MED ORDER — SERTRALINE HCL 50 MG PO TABS: 50.0000 mg | ORAL_TABLET | Freq: Every day | ORAL | 3 refills | Status: AC

## 2024-03-17 MED ORDER — REPATHA SURECLICK 140 MG/ML ~~LOC~~ SOAJ: 140.0000 mg | SUBCUTANEOUS | 4 refills | Status: AC

## 2024-03-17 MED ORDER — PANTOPRAZOLE SODIUM 40 MG PO TBEC: 40.0000 mg | DELAYED_RELEASE_TABLET | Freq: Every day | ORAL | 3 refills | Status: AC

## 2024-03-17 MED ORDER — NEBIVOLOL HCL 5 MG PO TABS: 5.0000 mg | ORAL_TABLET | Freq: Two times a day (BID) | ORAL | 3 refills | Status: AC

## 2024-03-17 MED ORDER — CLOPIDOGREL BISULFATE 75 MG PO TABS: 75.0000 mg | ORAL_TABLET | Freq: Every day | ORAL | 3 refills | Status: AC

## 2024-03-17 MED ORDER — METFORMIN HCL 500 MG PO TABS: 500.0000 mg | ORAL_TABLET | Freq: Every day | ORAL | 3 refills | Status: DC

## 2024-03-17 NOTE — Progress Notes (Signed)
 [name] is a 73 y.o. female presents to office today for annual physical exam examination.    Concerns today include: 1. Type 2 Diabetes with hypertension, hyperlipidemia:  Patient reports with medications.  Reports no hypoglycemic episodes.  Lowest blood sugar that she has seen was 101.  She has not been exercising regularly but she does try to watch her diet.  Last eye exam: UTD Last foot exam: needs Last A1c:  Lab Results  Component Value Date   HGBA1C 5.9 (H) 03/16/2024   Nephropathy screen indicated?: UTD, pending result Last flu, zoster and/or pneumovax:  Immunization History  Administered Date(s) Administered   Fluad Quad(high Dose 65+) 09/07/2019, 11/06/2021, 10/24/2022   Influenza,inj,Quad PF,6+ Mos 10/29/2016, 01/03/2018, 10/08/2018   Moderna SARS-COV2 Booster Vaccination 01/18/2021   Moderna Sars-Covid-2 Vaccination 02/25/2020, 03/29/2020   Pneumococcal Conjugate-13 08/02/2017   Pneumococcal Polysaccharide-23 08/04/2018   Tdap 10/30/2011, 03/17/2024   Zoster Recombinant(Shingrix) 02/07/2022, 05/09/2022    ROS: Feels low energy and off-balance sometimes.  Admits that she is not active regularly because she "is just sometimes lazy".  She does stay active within the home and frequently cleans and listens to audiobooks.  She is in the middle of try to reorganize the home  Occupation: Retired  Health Maintenance Due  Topic Date Due   Diabetic kidney evaluation - Urine ACR  10/25/2023   FOOT EXAM  02/19/2024   Refills needed today: Possibly all  Immunization History  Administered Date(s) Administered   Fluad Quad(high Dose 65+) 09/07/2019, 11/06/2021, 10/24/2022   Influenza,inj,Quad PF,6+ Mos 10/29/2016, 01/03/2018, 10/08/2018   Moderna SARS-COV2 Booster Vaccination 01/18/2021   Moderna Sars-Covid-2 Vaccination 02/25/2020, 03/29/2020   Pneumococcal Conjugate-13 08/02/2017   Pneumococcal Polysaccharide-23 08/04/2018   Tdap 10/30/2011, 03/17/2024   Zoster  Recombinant(Shingrix) 02/07/2022, 05/09/2022   Past Medical History:  Diagnosis Date   Acute CVA (cerebrovascular accident) (HCC) 07/20/2022   Anxiety    Arthritis    of the neck   Colon polyps    Diabetes mellitus without complication (HCC)    Esophageal stricture    Essential hypertension 01/05/2020   GERD (gastroesophageal reflux disease)    Hiatal Hernia   History of uterine cancer    Hyperlipidemia    Hypertension    Occipital stroke (HCC) 02/13/2019   Osteoporosis    Pacemaker    Symptomatic bradycardia    Tingling in extremities 07/20/2022   Social History   Socioeconomic History   Marital status: Significant Other    Spouse name: Not on file   Number of children: 1   Years of education: GED   Highest education level: GED or equivalent  Occupational History   Occupation: retired    Comment: Scientist, product/process development  Tobacco Use   Smoking status: Never   Smokeless tobacco: Never  Vaping Use   Vaping status: Never Used  Substance and Sexual Activity   Alcohol use: No    Comment: may have a social drink rarely   Drug use: No   Sexual activity: Not Currently  Other Topics Concern   Not on file  Social History Narrative   Lives with sig other   Social Drivers of Health   Financial Resource Strain: Low Risk  (12/27/2023)   Overall Financial Resource Strain (CARDIA)    Difficulty of Paying Living Expenses: Not hard at all  Food Insecurity: No Food Insecurity (12/27/2023)   Hunger Vital Sign    Worried About Running Out of Food in the Last Year: Never true  Ran Out of Food in the Last Year: Never true  Transportation Needs: No Transportation Needs (12/27/2023)   PRAPARE - Administrator, Civil Service (Medical): No    Lack of Transportation (Non-Medical): No  Physical Activity: Insufficiently Active (12/27/2023)   Exercise Vital Sign    Days of Exercise per Week: 3 days    Minutes of Exercise per Session: 30 min  Stress: No Stress Concern Present (12/27/2023)    [employer] of Occupational Health - Occupational Stress Questionnaire    Feeling of Stress : Not at all  Social Connections: Moderately Integrated (12/27/2023)   Social Connection and Isolation Panel [NHANES]    Frequency of Communication with Friends and Family: More than three times a week    Frequency of Social Gatherings with Friends and Family: Three times a week    Attends Religious Services: 1 to 4 times per year    Active Member of Clubs or Organizations: No    Attends Banker Meetings: Never    Marital Status: Living with partner  Intimate Partner Violence: Not At Risk (12/27/2023)   Humiliation, Afraid, Rape, and Kick questionnaire    Fear of Current or Ex-Partner: No    Emotionally Abused: No    Physically Abused: No    Sexually Abused: No   Past Surgical History:  Procedure Laterality Date   APPENDECTOMY     CHOLECYSTECTOMY     LAPAROSCOPIC TOTAL HYSTERECTOMY     LYMPH NODE DISSECTION     bilateral pelvic and right-sided periaortic   PACEMAKER GENERATOR CHANGE N/A 05/21/2014   Procedure: PACEMAKER GENERATOR CHANGE;  Surgeon: Marinus Maw, MD;  Location: [facility] CATH LAB;  Service: Cardiovascular;  Laterality: N/A;   status post pacemaker     metronic [id number]   TRANSTHORACIC ECHOCARDIOGRAM  08/30/04   TUBAL LIGATION     Family History  Problem Relation Age of Onset   Heart disease Mother    Heart attack Mother    Heart disease Father    Heart attack Father    Cancer Sister        breast   Breast cancer Sister    COPD Sister    Alcohol abuse Brother    Healthy Daughter     Current Outpatient Medications:    Blood Glucose Monitoring Suppl (ONE TOUCH ULTRA 2) w/Device KIT, 1 Device by Does not apply route daily., Disp: 1 each, Rfl: 0   calcium citrate-vitamin D (CITRACAL+D) 315-200 MG-UNIT per tablet, Take 1 tablet by mouth daily., Disp: , Rfl:    carboxymethylcellulose (REFRESH PLUS) 0.5 % SOLN, Place 1 drop into both eyes daily as needed  (dry eyes)., Disp: , Rfl:    COLLAGEN PO, Take 0.5 Scoops by mouth daily., Disp: , Rfl:    diclofenac Sodium (VOLTAREN) 1 % GEL, Apply 4 g topically 4 (four) times daily., Disp: 400 g, Rfl: PRN   glucose blood (ONETOUCH ULTRA) test strip, CHECK BLOOD SUGAR up to 4 times DAILY OR AS DIRECTED E11.9, Disp: 400 strip, Rfl: 3   Lancets (ONETOUCH DELICA PLUS LANCET33G) MISC, CHECK BLOOD SUGAR up to 4 times DAILY OR AS DIRECTED E11.9, Disp: 400 each, Rfl: 3   clopidogrel (PLAVIX) 75 MG tablet, Take 1 tablet (75 mg total) by mouth daily., Disp: 100 tablet, Rfl: 3   Evolocumab (REPATHA SURECLICK) 140 MG/ML SOAJ, Inject 140 mg into the skin every 14 (fourteen) days., Disp: 6 mL, Rfl: 4   metFORMIN (GLUCOPHAGE) 500 MG tablet, Take 1  tablet (500 mg total) by mouth daily., Disp: 100 tablet, Rfl: 3   nebivolol (BYSTOLIC) 5 MG tablet, Take 1 tablet (5 mg total) by mouth 2 (two) times daily., Disp: 200 tablet, Rfl: 3   pantoprazole (PROTONIX) 40 MG tablet, Take 1 tablet (40 mg total) by mouth daily., Disp: 100 tablet, Rfl: 3   sertraline (ZOLOFT) 50 MG tablet, Take 1 tablet (50 mg total) by mouth daily., Disp: 100 tablet, Rfl: 3  Allergies  Allergen Reactions   Glipizide Itching   Pravachol [Pravastatin Sodium] Other (See Comments)    Stomach pain and constipation      ROS: Review of Systems Pertinent items noted in HPI and remainder of comprehensive ROS otherwise negative.    Physical exam BP 124/65   Pulse 63   Temp 98.5 F (36.9 C)   Ht 5\' 4"  (1.626 m)   Wt 147 lb 9.6 oz (67 kg)   SpO2 96%   BMI 25.34 kg/m  General appearance: alert, cooperative, appears stated age, and no distress Head: Normocephalic, without obvious abnormality, atraumatic Eyes: negative findings: lids and lashes normal, conjunctivae and sclerae normal, corneas clear, and pupils equal, round, reactive to light and accomodation Ears:  TMs normal with normal light reflexes Nose: Nares normal. Septum midline. Mucosa normal.  No drainage or sinus tenderness. Throat: lips, mucosa, and tongue normal; teeth and gums normal Neck: no adenopathy, no carotid bruit, supple, symmetrical, trachea midline, and thyroid not enlarged, symmetric, no tenderness/mass/nodules Back: symmetric, no curvature. ROM normal. No CVA tenderness. Lungs: clear to auscultation bilaterally Heart:  Regular rate and rhythm.  S1, S2 heard. 2/6 systolic ejection murmur appreciated at bilateral sternal borders and did not radiate to carotids Abdomen: soft, non-tender; bowel sounds normal; no masses,  no organomegaly Extremities: extremities normal, atraumatic, no cyanosis or edema Pulses: 2+ and symmetric Skin: Skin color, texture, turgor normal. No rashes or lesions Lymph nodes: Cervical, supraclavicular, and axillary nodes normal. Neurologic: Grossly normal   Diabetic Foot Exam - Simple   Simple Foot Form Diabetic Foot exam was performed with the following findings: Yes 03/17/2024 12:44 PM  Visual Inspection No deformities, no ulcerations, no other skin breakdown bilaterally: Yes Sensation Testing Intact to touch and monofilament testing bilaterally: Yes Pulse Check Posterior Tibialis and Dorsalis pulse intact bilaterally: Yes Comments       03/17/2024   12:44 PM 02/13/2024    9:40 AM 12/27/2023    1:56 PM  Depression screen PHQ 2/9  Decreased Interest 0 0 0  Down, Depressed, Hopeless 1 2 0  PHQ - 2 Score 1 2 0  Altered sleeping 1 2   Tired, decreased energy 0 1   Change in appetite 0 0   Feeling bad or failure about yourself  1 1   Trouble concentrating 1 1   Moving slowly or fidgety/restless 0 1   Suicidal thoughts 0 0   PHQ-9 Score 4 8   Difficult doing work/chores Somewhat difficult Somewhat difficult       03/17/2024   12:44 PM 02/13/2024    9:40 AM 06/03/2023   11:17 AM 02/19/2023   10:10 AM  GAD 7 : Generalized Anxiety Score  Nervous, Anxious, on Edge 1 2 0 0  Control/stop worrying 2 2 0 0  Worry too much - different things  1 2 0 0  Trouble relaxing 1 1 0 0  Restless 1 0 0 0  Easily annoyed or irritable 1 2 0 0  Afraid - awful might happen 2  2 0 0  Total GAD 7 Score 9 11 0 0  Anxiety Difficulty Somewhat difficult Somewhat difficult Not difficult at all Not difficult at all   Results for orders placed or performed in visit on 03/16/24 (from the past 72 hours)  Bayer DCA Hb A1c Waived     Status: Abnormal   Collection Time: 03/16/24  9:15 AM  Result Value Ref Range   HB A1C (BAYER DCA - WAIVED) 5.9 (H) 4.8 - 5.6 %    Comment:          Prediabetes: 5.7 - 6.4          Diabetes: >6.4          Glycemic control for adults with diabetes: <7.0   Microalbumin / creatinine urine ratio     Status: None (Preliminary result)   Collection Time: 03/16/24  9:18 AM  Result Value Ref Range   Creatinine, Urine WILL FOLLOW    Microalbumin, Urine WILL FOLLOW    Microalb/Creat Ratio WILL FOLLOW   TSH     Status: None   Collection Time: 03/16/24  9:18 AM  Result Value Ref Range   TSH 1.420 0.450 - 4.500 uIU/mL  Lipid Panel     Status: Abnormal   Collection Time: 03/16/24  9:18 AM  Result Value Ref Range   Cholesterol, Total 155 100 - 199 mg/dL   Triglycerides 295 (H) 0 - 149 mg/dL   HDL 58 >28 mg/dL   VLDL Cholesterol Cal 30 5 - 40 mg/dL   LDL Chol Calc (NIH) 67 0 - 99 mg/dL   Chol/HDL Ratio 2.7 0.0 - 4.4 ratio    Comment:                                   T. Chol/HDL Ratio                                             Men  Women                               1/2 Avg.Risk  3.4    3.3                                   Avg.Risk  5.0    4.4                                2X Avg.Risk  9.6    7.1                                3X Avg.Risk 23.4   11.0   CMP14+EGFR     Status: Abnormal   Collection Time: 03/16/24  9:18 AM  Result Value Ref Range   Glucose 121 (H) 70 - 99 mg/dL   BUN 16 8 - 27 mg/dL   Creatinine, Ser 4.13 0.57 - 1.00 mg/dL   eGFR 74 >24 MW/NUU/7.25   BUN/Creatinine Ratio 19 12 - 28   Sodium 141 134 -  144 mmol/L   Potassium 4.2 3.5 -  5.2 mmol/L   Chloride 101 96 - 106 mmol/L   CO2 26 20 - 29 mmol/L   Calcium 10.1 8.7 - 10.3 mg/dL   Total Protein 6.7 6.0 - 8.5 g/dL   Albumin 4.5 3.8 - 4.8 g/dL   Globulin, Total 2.2 1.5 - 4.5 g/dL   Bilirubin Total 0.6 0.0 - 1.2 mg/dL   Alkaline Phosphatase 82 44 - 121 IU/L   AST 11 0 - 40 IU/L   ALT 9 0 - 32 IU/L     Assessment/ Plan: Bernie Covey here for annual physical exam.   Annual physical exam  Diabetes mellitus treated with oral medication (HCC) - Plan: metFORMIN (GLUCOPHAGE) 500 MG tablet  Hyperlipidemia associated with type 2 diabetes mellitus (HCC) - Plan: Evolocumab (REPATHA SURECLICK) 140 MG/ML SOAJ  History of lacunar cerebrovascular accident (CVA) - Plan: Evolocumab (REPATHA SURECLICK) 140 MG/ML SOAJ  Hypertension associated with diabetes (HCC) - Plan: nebivolol (BYSTOLIC) 5 MG tablet, Evolocumab (REPATHA SURECLICK) 140 MG/ML SOAJ  Gastroesophageal reflux disease without esophagitis - Plan: pantoprazole (PROTONIX) 40 MG tablet  Anxiety - Plan: sertraline (ZOLOFT) 50 MG tablet  We reviewed that her sugars under excellent control with A1c at 5.9.  Diabetic foot exam updated today.  Diabetic urine microalbumin was still pending at time of visit.  We reviewed her labs at length today and a copy was provided to her today.  She is going to try to sign up for MyChart soon with her daughter  All meds were renewed today.  She should continue Repatha given history of statin intolerance for secondary prevention given history of lacunar infarct  Her blood pressure was well-controlled with current dose  GERD and anxiety were chronic and stable.  Encouraged physical exercise  Counseled on healthy lifestyle choices, including diet (rich in fruits, vegetables and lean meats and low in salt and simple carbohydrates) and exercise (at least 30 minutes of moderate physical activity daily).  Patient to follow up 73m  Jenner Rosier M. Nadine Counts,  DO

## 2024-03-18 ENCOUNTER — Telehealth: Payer: Self-pay

## 2024-03-18 NOTE — Telephone Encounter (Signed)
 NOTED

## 2024-03-18 NOTE — Telephone Encounter (Signed)
 Copied from CRM [id number]. Topic: Clinical - Lab/Test Results >> Mar 18, 2024 12:33 PM Fuller Mandril wrote: Reason for CRM: Patient returned missed call for lab results. Read both notes as written by provider. Patient understood. States she will think about the [place] or [place] and let the provider know what she decides. Thank You

## 2024-03-30 ENCOUNTER — Encounter: Payer: PPO | Admitting: Student

## 2024-03-31 NOTE — Progress Notes (Unsigned)
  Electrophysiology Office Note:   ID:  Stephanie, [name] September 29, 1951, MRN [id number]  Primary Cardiologist: None Electrophysiologist: Manya Sells, MD  {Click to update primary MD,subspecialty MD or APP then REFRESH:1}    History of Present Illness:   [name] is a 73 y.o. female with h/o SND s/p PPN and HTN seen today for routine electrophysiology followup.   Since last being seen in our clinic the patient reports doing ***.  she denies chest pain, palpitations, dyspnea, PND, orthopnea, nausea, vomiting, dizziness, syncope, edema, weight gain, or early satiety.   Review of systems complete and found to be negative unless listed in HPI.   EP Information / Studies Reviewed:    EKG is not ordered today. EKG from 02/03/2024 reviewed which showed NSR at 63 bpm       PPM Interrogation-  reviewed in detail today,  See PACEART report.  Arrhythmia/Device History Medtronic Dual Chamber PPM 2005, gen change 2015 for SND   Physical Exam:   VS:  There were no vitals taken for this visit.   Wt Readings from Last 3 Encounters:  03/17/24 147 lb 9.6 oz (67 kg)  02/13/24 151 lb (68.5 kg)  02/03/24 153 lb (69.4 kg)     GEN: No acute distress  NECK: No JVD; No carotid bruits CARDIAC: {EPRHYTHM:28826}, no murmurs, rubs, gallops RESPIRATORY:  Clear to auscultation without rales, wheezing or rhonchi  ABDOMEN: Soft, non-tender, non-distended EXTREMITIES:  {EDEMA LEVEL:28147::"No"} edema; No deformity   ASSESSMENT AND PLAN:    SND s/p Medtronic PPM  Normal PPM function See Pace Art report No changes today  HTN Stable on current regimen   {Click here to Review PMH, Prob List, Meds, Allergies, SHx, FHx  :1}   Disposition:   Follow up with {EPPROVIDERS:28135} {EPFOLLOW UP:28173}  Signed, Tylene Galla, PA-C

## 2024-04-01 ENCOUNTER — Encounter: Payer: Self-pay | Admitting: Student

## 2024-04-01 ENCOUNTER — Ambulatory Visit: Attending: Student | Admitting: Student

## 2024-04-01 VITALS — BP 118/68 | HR 61 | Ht 64.0 in | Wt 149.0 lb

## 2024-04-01 DIAGNOSIS — I495 Sick sinus syndrome: Secondary | ICD-10-CM | POA: Diagnosis not present

## 2024-04-01 DIAGNOSIS — I10 Essential (primary) hypertension: Secondary | ICD-10-CM | POA: Diagnosis not present

## 2024-04-01 LAB — CUP PACEART INCLINIC DEVICE CHECK
Battery Impedance: 1529 Ohm
Battery Remaining Longevity: 53 mo
Battery Voltage: 2.76 V
Brady Statistic AP VP Percent: 0 %
Brady Statistic AP VS Percent: 2 %
Brady Statistic AS VP Percent: 0 %
Brady Statistic AS VS Percent: 98 %
Date Time Interrogation Session: 20250416124715
Implantable Lead Connection Status: 753985
Implantable Lead Connection Status: 753985
Implantable Lead Implant Date: 20050914
Implantable Lead Implant Date: 20050914
Implantable Lead Location: 753859
Implantable Lead Location: 753860
Implantable Lead Model: 5076
Implantable Lead Model: 5076
Implantable Pulse Generator Implant Date: 20150605
Lead Channel Impedance Value: 456 Ohm
Lead Channel Impedance Value: 553 Ohm
Lead Channel Pacing Threshold Amplitude: 0.375 V
Lead Channel Pacing Threshold Amplitude: 0.5 V
Lead Channel Pacing Threshold Amplitude: 0.75 V
Lead Channel Pacing Threshold Amplitude: 1.25 V
Lead Channel Pacing Threshold Pulse Width: 0.4 ms
Lead Channel Pacing Threshold Pulse Width: 0.4 ms
Lead Channel Pacing Threshold Pulse Width: 0.4 ms
Lead Channel Pacing Threshold Pulse Width: 0.4 ms
Lead Channel Sensing Intrinsic Amplitude: 11.2 mV
Lead Channel Sensing Intrinsic Amplitude: 2.8 mV
Lead Channel Setting Pacing Amplitude: 2 V
Lead Channel Setting Pacing Amplitude: 2.5 V
Lead Channel Setting Pacing Pulse Width: 0.52 ms
Lead Channel Setting Sensing Sensitivity: 4 mV
Zone Setting Status: 755011
Zone Setting Status: 755011

## 2024-04-01 NOTE — Patient Instructions (Signed)
 Medication Instructions:  Your physician recommends that you continue on your current medications as directed. Please refer to the Current Medication list given to you today.  *If you need a refill on your cardiac medications before your next appointment, please call your pharmacy*  Lab Work: None ordered If you have labs (blood work) drawn today and your tests are completely normal, you will receive your results only by: MyChart Message (if you have MyChart) OR A paper copy in the mail If you have any lab test that is abnormal or we need to change your treatment, we will call you to review the results.  Follow-Up: At [facility], you and your health needs are our priority.  As part of our continuing mission to provide you with exceptional heart care, our providers are all part of one team.  This team includes your primary Cardiologist (physician) and Advanced Practice Providers or APPs (Physician Assistants and Nurse Practitioners) who all work together to provide you with the care you need, when you need it.  Your next appointment:   1 year(s)  Provider:   You will see one of the following Advanced Practice Providers on your designated Care Team:   Francis Dowse, [place] Casimiro Needle "Mardelle Matte" Tillery, PA-C Canary Brim, NP      1st Floor: - Lobby - Registration  - Pharmacy  - Lab - Cafe  2nd Floor: - PV Lab - Diagnostic Testing (echo, CT, nuclear med)  3rd Floor: - Vacant  4th Floor: - TCTS (cardiothoracic surgery) - AFib Clinic - Structural Heart Clinic - Vascular Surgery  - Vascular Ultrasound  5th Floor: - [facility]Care Cardiology (general and EP) - Clinical Pharmacy for coumadin, hypertension, lipid, weight-loss medications, and med management appointments    Valet parking services will be available as well.

## 2024-04-07 DIAGNOSIS — L570 Actinic keratosis: Secondary | ICD-10-CM | POA: Diagnosis not present

## 2024-04-07 DIAGNOSIS — L719 Rosacea, unspecified: Secondary | ICD-10-CM | POA: Diagnosis not present

## 2024-04-07 NOTE — Addendum Note (Signed)
 Addended by: Lott Rouleau A on: 04/07/2024 01:35 PM   Modules accepted: Orders

## 2024-04-07 NOTE — Progress Notes (Signed)
 Remote pacemaker transmission.

## 2024-05-20 ENCOUNTER — Ambulatory Visit (INDEPENDENT_AMBULATORY_CARE_PROVIDER_SITE_OTHER): Payer: PPO

## 2024-05-20 DIAGNOSIS — I495 Sick sinus syndrome: Secondary | ICD-10-CM

## 2024-05-21 LAB — CUP PACEART REMOTE DEVICE CHECK
Battery Impedance: 1618 Ohm
Battery Remaining Longevity: 50 mo
Battery Voltage: 2.75 V
Brady Statistic AP VP Percent: 1 %
Brady Statistic AP VS Percent: 5 %
Brady Statistic AS VP Percent: 0 %
Brady Statistic AS VS Percent: 94 %
Date Time Interrogation Session: 20250605125746
Implantable Lead Connection Status: 753985
Implantable Lead Connection Status: 753985
Implantable Lead Implant Date: 20050914
Implantable Lead Implant Date: 20050914
Implantable Lead Location: 753859
Implantable Lead Location: 753860
Implantable Lead Model: 5076
Implantable Lead Model: 5076
Implantable Pulse Generator Implant Date: 20150605
Lead Channel Impedance Value: 489 Ohm
Lead Channel Impedance Value: 543 Ohm
Lead Channel Pacing Threshold Amplitude: 0.5 V
Lead Channel Pacing Threshold Amplitude: 0.625 V
Lead Channel Pacing Threshold Pulse Width: 0.4 ms
Lead Channel Pacing Threshold Pulse Width: 0.4 ms
Lead Channel Setting Pacing Amplitude: 2 V
Lead Channel Setting Pacing Amplitude: 2.5 V
Lead Channel Setting Pacing Pulse Width: 0.4 ms
Lead Channel Setting Sensing Sensitivity: 4 mV
Zone Setting Status: 755011
Zone Setting Status: 755011

## 2024-05-24 ENCOUNTER — Ambulatory Visit: Payer: Self-pay | Admitting: Internal Medicine

## 2024-06-09 ENCOUNTER — Encounter: Payer: Self-pay | Admitting: Family Medicine

## 2024-06-09 ENCOUNTER — Ambulatory Visit: Admitting: Family Medicine

## 2024-06-09 ENCOUNTER — Ambulatory Visit: Payer: Self-pay

## 2024-06-09 VITALS — BP 144/73 | HR 60 | Temp 98.3°F | Ht 64.0 in | Wt 149.0 lb

## 2024-06-09 DIAGNOSIS — N760 Acute vaginitis: Secondary | ICD-10-CM

## 2024-06-09 DIAGNOSIS — I152 Hypertension secondary to endocrine disorders: Secondary | ICD-10-CM | POA: Diagnosis not present

## 2024-06-09 DIAGNOSIS — E1159 Type 2 diabetes mellitus with other circulatory complications: Secondary | ICD-10-CM | POA: Diagnosis not present

## 2024-06-09 DIAGNOSIS — N8110 Cystocele, unspecified: Secondary | ICD-10-CM

## 2024-06-09 DIAGNOSIS — R300 Dysuria: Secondary | ICD-10-CM

## 2024-06-09 LAB — URINALYSIS, ROUTINE W REFLEX MICROSCOPIC
Bilirubin, UA: NEGATIVE
Glucose, UA: NEGATIVE
Ketones, UA: NEGATIVE
Nitrite, UA: NEGATIVE
Protein,UA: NEGATIVE
RBC, UA: NEGATIVE
Specific Gravity, UA: 1.02 (ref 1.005–1.030)
Urobilinogen, Ur: 0.2 mg/dL (ref 0.2–1.0)
pH, UA: 5.5 (ref 5.0–7.5)

## 2024-06-09 LAB — MICROSCOPIC EXAMINATION
Bacteria, UA: NONE SEEN
RBC, Urine: NONE SEEN /HPF (ref 0–2)
Renal Epithel, UA: NONE SEEN /HPF
Yeast, UA: NONE SEEN

## 2024-06-09 LAB — WET PREP FOR TRICH, YEAST, CLUE
Clue Cell Exam: NEGATIVE
Trichomonas Exam: NEGATIVE
Yeast Exam: NEGATIVE

## 2024-06-09 NOTE — Telephone Encounter (Signed)
  FYI Only or Action Required?: FYI only for provider.  Patient was last seen in primary care on 03/17/2024 by Stephanie Norene HERO, DO. Called Nurse Triage reporting Dysuria. Symptoms began several weeks ago. Interventions attempted: Nothing. Symptoms are: gradually worsening.  Triage Disposition: See Physician Within 24 Hours, See PCP When Office is Open (Within 3 Days)  Patient/caregiver understands and will follow disposition?: Yes   Copied from CRM [id number]. Topic: Clinical - Red Word Triage >> Jun 09, 2024  8:59 AM Kevelyn M wrote: Red Word that prompted transfer to Nurse Triage: UTI possible- burn when urinating and itching Reason for Disposition  Bad smelling vaginal discharge  Bad or foul-smelling urine  Answer Assessment - Initial Assessment Questions 1. SYMPTOM: What's the main symptom you're concerned about? (e.g., frequency, incontinence)     Burning with urination and incontinence  2. ONSET: When did the  Burning with urination  start?     Has been going on for weeks, seems to clear up but then gets worse  3. PAIN: Is there any pain? If Yes, ask: How bad is it? (Scale: 1-10; mild, moderate, severe)     6/10 right now  4. CAUSE: What do you think is causing the symptoms?     UTI and /or yeast infection  5. OTHER SYMPTOMS: Do you have any other symptoms? (e.g., blood in urine, fever, flank pain, pain with urination)     Lower abd pain  6. PREGNANCY: Is there any chance you are pregnant? When was your last menstrual period?     No  Answer Assessment - Initial Assessment Questions 1. DISCHARGE: Describe the discharge. (e.g., white, yellow, green, gray, foamy, cottage cheese-like)     Unsure of type of discharge, just feel it running down leg  2. ODOR: Is there a bad odor?     Yes  3. ONSET: When did the discharge begin?     A few weeks  4. RASH: Is there a rash in the genital area? If Yes, ask: Describe it. (e.g., redness, blisters, sores,  bumps)     No  5. ABDOMEN PAIN: Are you having any abdomen pain? If Yes, ask: What does it feel like?  (e.g., crampy, dull, intermittent, constant)      Yes, lower abd pain, dull and intermittent  6. ABDOMEN PAIN SEVERITY: If present, ask: How bad is it? (e.g., Scale 1-10; mild, moderate, or severe)   - MILD (1-3): Doesn't interfere with normal activities, abdomen soft and not tender to touch.    - MODERATE (4-7): Interferes with normal activities or awakens from sleep, abdomen tender to touch.    - SEVERE (8-10): Excruciating pain, doubled over, unable to do any normal activities. (R/O peritonitis)      Moderate  7. CAUSE: What do you think is causing the discharge? Have you had the same problem before? What happened then?     Unsure  8. OTHER SYMPTOMS: Do you have any other symptoms? (e.g., fever, itching, vaginal bleeding, pain with urination, injury to genital area, vaginal foreign body)     [place] with urination  9. PREGNANCY: Is there any chance you are pregnant? When was your last menstrual period?     No  Protocols used: Urinary Symptoms-A-AH, Vaginal Discharge-A-AH

## 2024-06-09 NOTE — Telephone Encounter (Signed)
 E2C2 scheduled appointment.

## 2024-06-09 NOTE — Progress Notes (Signed)
 Subjective:  Patient ID: [name], female    DOB: Dec 29, 1950, 73 y.o.   MRN: [id number]  Patient Care Team: Jolinda Norene HERO, DO as PCP - General (Family Medicine) Waddell Danelle ORN, MD as PCP - Electrophysiology (Cardiology) Ladora Ross Lacy Phebe, MD as Referring Physician (Optometry)   Chief Complaint:  vaginal symptoms  HPI: [name] is a 73 y.o. female presenting on 06/09/2024 for vaginal symptoms  HPI 1. Dysuria States that symptoms started years ago and most recent flare has lasted several months. Reports that something dropped or shifted. States that it comes and goes. Reports that she feels gauded. She reports burning and itching. Reports discharge and odor. Denies low back pain, N/V, fever. Reports that she typically has BM daily. States that she is constipated on occasion and will have straining. She has history of hysterectomy.    Relevant past medical, surgical, family, and social history reviewed and updated as indicated.  Allergies and medications reviewed and updated. Data reviewed: Chart in Epic.   Past Medical History:  Diagnosis Date   Acute CVA (cerebrovascular accident) (HCC) 07/20/2022   Anxiety    Arthritis    of the neck   Colon polyps    Diabetes mellitus without complication (HCC)    Esophageal stricture    Essential hypertension 01/05/2020   GERD (gastroesophageal reflux disease)    Hiatal Hernia   History of uterine cancer    Hyperlipidemia    Hypertension    Occipital stroke (HCC) 02/13/2019   Osteoporosis    Pacemaker    Symptomatic bradycardia    Tingling in extremities 07/20/2022    Past Surgical History:  Procedure Laterality Date   APPENDECTOMY     CHOLECYSTECTOMY     LAPAROSCOPIC TOTAL HYSTERECTOMY     LYMPH NODE DISSECTION     bilateral pelvic and right-sided periaortic   PACEMAKER GENERATOR CHANGE N/A 05/21/2014   Procedure: PACEMAKER GENERATOR CHANGE;  Surgeon: Danelle ORN Waddell, MD;  Location: [facility];  Service:  Cardiovascular;  Laterality: N/A;   status post pacemaker     metronic [id number]   TRANSTHORACIC ECHOCARDIOGRAM  08/30/04   TUBAL LIGATION      Social History   Socioeconomic History   Marital status: Significant Other    Spouse name: Not on file   Number of children: 1   Years of education: GED   Highest education level: GED or equivalent  Occupational History   Occupation: retired    Comment: Scientist, product/process development  Tobacco Use   Smoking status: Never   Smokeless tobacco: Never  Vaping Use   Vaping status: Never Used  Substance and Sexual Activity   Alcohol use: No    Comment: may have a social drink rarely   Drug use: No   Sexual activity: Not Currently  Other Topics Concern   Not on file  Social History Narrative   Lives with sig other   Social Drivers of Health   Financial Resource Strain: Low Risk  (12/27/2023)   Overall Financial Resource Strain (CARDIA)    Difficulty of Paying Living Expenses: Not hard at all  Food Insecurity: No Food Insecurity (12/27/2023)   Hunger Vital Sign    Worried About Running Out of Food in the Last Year: Never true    Ran Out of Food in the Last Year: Never true  Transportation Needs: No Transportation Needs (12/27/2023)   PRAPARE - Administrator, Civil Service (Medical): No  Lack of Transportation (Non-Medical): No  Physical Activity: Insufficiently Active (12/27/2023)   Exercise Vital Sign    Days of Exercise per Week: 3 days    Minutes of Exercise per Session: 30 min  Stress: No Stress Concern Present (12/27/2023)   [employer] of Occupational Health - Occupational Stress Questionnaire    Feeling of Stress : Not at all  Social Connections: Moderately Integrated (12/27/2023)   Social Connection and Isolation Panel    Frequency of Communication with Friends and Family: More than three times a week    Frequency of Social Gatherings with Friends and Family: Three times a week    Attends Religious Services: 1 to 4  times per year    Active Member of Clubs or Organizations: No    Attends Banker Meetings: Never    Marital Status: Living with partner  Intimate Partner Violence: Not At Risk (12/27/2023)   Humiliation, Afraid, Rape, and Kick questionnaire    Fear of Current or Ex-Partner: No    Emotionally Abused: No    Physically Abused: No    Sexually Abused: No    Outpatient Encounter Medications as of 06/09/2024  Medication Sig   Blood Glucose Monitoring Suppl (ONE TOUCH ULTRA 2) w/Device KIT 1 Device by Does not apply route daily.   calcium  citrate-vitamin D  (CITRACAL+D) 315-200 MG-UNIT per tablet Take 1 tablet by mouth daily.   carboxymethylcellulose (REFRESH PLUS) 0.5 % SOLN Place 1 drop into both eyes daily as needed (dry eyes).   clopidogrel  (PLAVIX ) 75 MG tablet Take 1 tablet (75 mg total) by mouth daily.   COLLAGEN PO Take 0.5 Scoops by mouth daily.   diclofenac  Sodium (VOLTAREN ) 1 % GEL Apply 4 g topically 4 (four) times daily.   doxycycline  (VIBRAMYCIN ) 50 MG capsule Take 50 mg by mouth daily.   Evolocumab  (REPATHA  SURECLICK) 140 MG/ML SOAJ Inject 140 mg into the skin every 14 (fourteen) days.   glucose blood (ONETOUCH ULTRA) test strip CHECK BLOOD SUGAR up to 4 times DAILY OR AS DIRECTED E11.9   Lancets (ONETOUCH DELICA PLUS LANCET33G) MISC CHECK BLOOD SUGAR up to 4 times DAILY OR AS DIRECTED E11.9   nebivolol  (BYSTOLIC ) 5 MG tablet Take 1 tablet (5 mg total) by mouth 2 (two) times daily.   pantoprazole  (PROTONIX ) 40 MG tablet Take 1 tablet (40 mg total) by mouth daily.   sertraline  (ZOLOFT ) 50 MG tablet Take 1 tablet (50 mg total) by mouth daily.   No facility-administered encounter medications on file as of 06/09/2024.    Allergies  Allergen Reactions   Glipizide  Itching   Pravachol  [Pravastatin  Sodium] Other (See Comments)    Stomach pain and constipation     Review of Systems As per HPI  Objective:  BP (!) 144/73   Pulse 60   Temp 98.3 F (36.8 C)   Ht 5' 4  (1.626 m)   Wt 149 lb (67.6 kg)   SpO2 98%   BMI 25.58 kg/m    Wt Readings from Last 3 Encounters:  06/09/24 149 lb (67.6 kg)  04/01/24 149 lb (67.6 kg)  03/17/24 147 lb 9.6 oz (67 kg)   Physical Exam Chaperone present: declined.  Constitutional:      General: She is awake. She is not in acute distress.    Appearance: Normal appearance. She is well-developed and well-groomed. She is not ill-appearing, toxic-appearing or diaphoretic.   Cardiovascular:     Rate and Rhythm: Normal rate and regular rhythm.  Heart sounds: Normal heart sounds. No murmur heard.    No gallop.  Pulmonary:     Effort: Pulmonary effort is normal. No respiratory distress.     Breath sounds: Normal breath sounds. No stridor. No wheezing, rhonchi or rales.  Genitourinary:    Exam position: Lithotomy position.     Pubic Area: No rash or pubic lice.      Tanner stage (genital): 5.     Vagina: No signs of injury and foreign body. Tenderness and prolapsed vaginal walls present. No vaginal discharge, erythema, bleeding or lesions.   Musculoskeletal:     Cervical back: Full passive range of motion without pain and neck supple.   Skin:    General: Skin is warm.     Capillary Refill: Capillary refill takes less than 2 seconds.   Neurological:     General: No focal deficit present.     Mental Status: She is alert, oriented to person, place, and time and easily aroused. Mental status is at baseline.     GCS: GCS eye subscore is 4. GCS verbal subscore is 5. GCS motor subscore is 6.     Motor: No weakness.   Psychiatric:        Attention and Perception: Attention and perception normal.        Mood and Affect: Mood and affect normal.        Speech: Speech normal.        Behavior: Behavior normal. Behavior is cooperative.        Thought Content: Thought content normal. Thought content does not include homicidal or suicidal ideation. Thought content does not include homicidal or suicidal plan.        Cognition  and Memory: Cognition and memory normal.        Judgment: Judgment normal.     Results for orders placed or performed in visit on 05/20/24  CUP PACEART REMOTE DEVICE CHECK   Collection Time: 05/21/24 12:57 PM  Result Value Ref Range   Date Time Interrogation Session [id number]    Pulse Generator Manufacturer MERM    Pulse Gen Model [id number] Adapta    Pulse Gen Serial Number [id number]    Clinic Name [facility]    Implantable Pulse Generator Type Implantable Pulse Generator    Implantable Pulse Generator Implant Date [id number]    Implantable Lead Manufacturer MERM    Implantable Lead Model 5076 CapSureFix Novus    Implantable Lead Serial Number [id number]    Implantable Lead Implant Date [id number]    Implantable Lead Location [id number]    Implantable Lead Connection Status [id number]    Implantable Lead Manufacturer MERM    Implantable Lead Model 5076 CapSureFix Novus    Implantable Lead Serial Number [id number]    Implantable Lead Implant Date 79949085    Implantable Lead Location [id number]    Implantable Lead Connection Status [id number]    Lead Channel Setting Sensing Sensitivity 4.00 mV   Lead Channel Setting Pacing Amplitude 2.000 V   Lead Channel Setting Pacing Pulse Width 0.40 ms   Lead Channel Setting Pacing Amplitude 2.500 V   Zone Setting Status 755011    Zone Setting Status 755011    Lead Channel Impedance Value 489 ohm   Lead Channel Pacing Threshold Amplitude 0.500 V   Lead Channel Pacing Threshold Pulse Width 0.40 ms   Lead Channel Impedance Value 543 ohm   Lead Channel Pacing Threshold Amplitude 0.625 V   Lead Channel Pacing Threshold Pulse Width 0.40 ms  Battery Status OK    Battery Remaining Longevity 50 mo   Battery Voltage 2.75 V   Battery Impedance 1,618 ohm   Brady Statistic AP VP Percent 1 %   Brady Statistic AS VP Percent 0 %   Brady Statistic AP VS Percent 5 %   Brady Statistic AS VS Percent 94 %       06/09/2024    2:30 PM 03/17/2024   12:44 PM  02/13/2024    9:40 AM 12/27/2023    1:56 PM 06/03/2023   11:17 AM  Depression screen PHQ 2/9  Decreased Interest 0 0 0 0 0  Down, Depressed, Hopeless 0 1 2 0 0  PHQ - 2 Score 0 1 2 0 0  Altered sleeping 2 1 2   0  Tired, decreased energy 2 0 1  0  Change in appetite 0 0 0  0  Feeling bad or failure about yourself  0 1 1  0  Trouble concentrating 2 1 1   0  Moving slowly or fidgety/restless 0 0 1  0  Suicidal thoughts 0 0 0  0  PHQ-9 Score 6 4 8   0  Difficult doing work/chores Somewhat difficult Somewhat difficult Somewhat difficult  Not difficult at all       06/09/2024    2:31 PM 03/17/2024   12:44 PM 02/13/2024    9:40 AM 06/03/2023   11:17 AM  GAD 7 : Generalized Anxiety Score  Nervous, Anxious, on Edge 1 1 2  0  Control/stop worrying 0 2 2 0  Worry too much - different things 0 1 2 0  Trouble relaxing 0 1 1 0  Restless 0 1 0 0  Easily annoyed or irritable 0 1 2 0  Afraid - awful might happen 0 2 2 0  Total GAD 7 Score 1 9 11  0  Anxiety Difficulty  Somewhat difficult Somewhat difficult Not difficult at all   Pertinent labs & imaging results that were available during my care of the patient were reviewed by me and considered in my medical decision making.  Assessment & Plan:  [name] was seen today for vaginal symptoms.  Diagnoses and all orders for this visit:  1. Acute vaginitis (Primary) Based on labs, will await culture to treat. Negative trich, yeast, clue. Results discussed with patient. Suspect prolapse. Will send to gynecology for further evaluation.  - WET PREP FOR TRICH, YEAST, CLUE - Ambulatory referral to Gynecology  2. Vaginal prolapse As above.   3. Dysuria As above.  - Urinalysis, Routine w reflex microscopic - Urine Culture  4. Hypertension associated with diabetes (HCC) Elevated BP in office. Patient to monitor at home and follow up with PCP     Continue all other maintenance medications.  Follow up plan: Return if symptoms worsen or fail to  improve.   Continue healthy lifestyle choices, including diet (rich in fruits, vegetables, and lean proteins, and low in salt and simple carbohydrates) and exercise (at least 30 minutes of moderate physical activity daily).  Written and verbal instructions provided   The above assessment and management plan was discussed with the patient. The patient verbalized understanding of and has agreed to the management plan. Patient is aware to call the clinic if they develop any new symptoms or if symptoms persist or worsen. Patient is aware when to return to the clinic for a follow-up visit. Patient educated on when it is appropriate to go to the emergency department.   Stephanie Kins, DNP-FNP [facility] [address] [place], [place] [address] [phone]

## 2024-06-11 ENCOUNTER — Ambulatory Visit: Payer: Self-pay | Admitting: Family Medicine

## 2024-06-11 LAB — URINE CULTURE

## 2024-06-11 NOTE — Progress Notes (Signed)
 Negative for all tests. Recommend follow up with ob.gyn.

## 2024-07-02 DIAGNOSIS — L719 Rosacea, unspecified: Secondary | ICD-10-CM | POA: Diagnosis not present

## 2024-07-02 DIAGNOSIS — L570 Actinic keratosis: Secondary | ICD-10-CM | POA: Diagnosis not present

## 2024-07-13 NOTE — Addendum Note (Signed)
 Addended by: VICCI SELLER A on: 07/13/2024 03:13 PM   Modules accepted: Orders

## 2024-07-13 NOTE — Progress Notes (Signed)
 Remote pacemaker transmission.

## 2024-07-23 ENCOUNTER — Ambulatory Visit: Payer: Self-pay

## 2024-07-23 NOTE — Telephone Encounter (Signed)
 FYI Only or Action Required?: FYI only for provider.  Patient was last seen in primary care on 06/09/2024 by Cathlene Marry Lenis, FNP.  Called Nurse Triage reporting Rectal Bleeding.  Symptoms began several days ago.  Interventions attempted: Nothing.  Symptoms are: rectal bleeding once daily with bowel movement gradually improving.  Triage Disposition: See PCP When Office is Open (Within 3 Days)  Patient/caregiver understands and will follow disposition?: Yes             Copied from CRM #[id number]. Topic: Clinical - Red Word Triage >> Jul 23, 2024  3:38 PM DeAngela L wrote: Red Word that prompted transfer to Nurse Triage: patient is having bleeding in her stool for the last 3 days but today she has not noticed any bleeding stool but calling to report to Dr Jolinda  (She was taking meds from the dermatologist and she thought this might be a side effect and office staff this this is not a side effect but patient believes it related) Pt num [phone] (H) Reason for Disposition  MILD rectal bleeding (e.g., more than just a few drops or streaks)  Answer Assessment - Initial Assessment Questions Patient states her dermatologist has placed her on doxycyline since April, taking daily.  1. APPEARANCE of BLOOD: What color is it? Is it passed separately, on the surface of the stool, or mixed in with the stool?      Blood in stool and when she wiped on toilet tissue. She states the first day there were large drops of blood on the stool. She states the new couple of days it wasn't as bad. She is unsure if it was a blood clot or just blood and normal color red.  2. AMOUNT: How much blood was passed?      She states the first day was maybe a table spoon and the next 2 days was less.  3. FREQUENCY: How many times has blood been passed with the stools?      No blood in stool today. She states baseline she has a bowel movement in the morning. The past 3 days she had once  daily blood in bowel movement.  4. ONSET: When was the blood first seen in the stools? (Days or weeks)      3 days ago.  5. DIARRHEA: Is there also some diarrhea? If Yes, ask: How many diarrhea stools in the past 24 hours?      Yes. She states after the hard stool there is liquid stool that comes out. No diarrhea stool in the past 24 hours.  6. CONSTIPATION: Do you have constipation? If Yes, ask: How bad is it?     Yes. She states when she first tries to have a BM it is hard.  7. RECURRENT SYMPTOMS: Have you had blood in your stools before? If Yes, ask: When was the last time? and What happened that time?      No.  8. BLOOD THINNERS: Do you take any blood thinners? (e.g., aspirin , clopidogrel  / Plavix , coumadin, heparin). Notes: Other strong blood thinners include: Arixtra (fondaparinux), Eliquis (apixaban), Pradaxa (dabigatran), and Xarelto (rivaroxaban).     Plavix .  9. OTHER SYMPTOMS: Do you have any other symptoms?  (e.g., abdomen pain, vomiting, dizziness, fever)     Patient states she feels lightheaded/dizzy but states that's a normal thing, it's been a while (she states she had a stroke in 2016 and she never fully recovered from it) and denies any worsening of the symptom. Patient  denies rectal pain or abdominal pain, fever, unilateral numbness or weakness. She states she has a history of hemorrhoids. She states she had a colonoscopy at the beginning of the year and she thinks they cut 1-2 off.  10. PREGNANCY: Is there any chance you are pregnant? When was your last menstrual period?       N/A.  Protocols used: Rectal Bleeding-A-AH

## 2024-07-23 NOTE — Telephone Encounter (Signed)
Appt scheduled for 8/8

## 2024-07-24 ENCOUNTER — Ambulatory Visit (INDEPENDENT_AMBULATORY_CARE_PROVIDER_SITE_OTHER): Admitting: Family Medicine

## 2024-07-24 ENCOUNTER — Encounter: Payer: Self-pay | Admitting: Family Medicine

## 2024-07-24 VITALS — BP 141/71 | HR 61 | Temp 97.8°F | Ht 64.0 in | Wt 148.0 lb

## 2024-07-24 DIAGNOSIS — L719 Rosacea, unspecified: Secondary | ICD-10-CM | POA: Diagnosis not present

## 2024-07-24 DIAGNOSIS — K649 Unspecified hemorrhoids: Secondary | ICD-10-CM

## 2024-07-24 DIAGNOSIS — K921 Melena: Secondary | ICD-10-CM | POA: Diagnosis not present

## 2024-07-24 MED ORDER — HYDROCORTISONE ACETATE 25 MG RE SUPP: 25.0000 mg | Freq: Two times a day (BID) | RECTAL | 0 refills | Status: AC | PRN

## 2024-07-24 MED ORDER — METRONIDAZOLE 1 % EX GEL: Freq: Every day | CUTANEOUS | 99 refills | Status: AC

## 2024-07-24 NOTE — Patient Instructions (Signed)
 Hemorrhoids Hemorrhoids are swollen veins that may form: In the butt (rectum). These are called internal hemorrhoids. Around the opening of the butt (anus). These are called external hemorrhoids. Most hemorrhoids do not cause very bad problems. They often get better with changes to your lifestyle and what you eat. What are the causes? Having trouble pooping (constipation) or watery poop (diarrhea). Pushing too hard when you poop. Pregnancy. Being very overweight (obese). Sitting for too long. Riding a bike for a long time. Heavy lifting or other things that take a lot of effort. Anal sex. What are the signs or symptoms? Pain. Itching or soreness in the butt. Bleeding from the butt. Leaking poop. Swelling. One or more lumps around the opening of your butt. How is this treated? In most cases, hemorrhoids can be treated at home. You may be told to: Change what you eat. Make changes to your lifestyle. If these treatments do not help, you may need to have a procedure done. Your doctor may need to: Place rubber bands at the bottom of the hemorrhoids to make them fall off. Put medicine into the hemorrhoids to shrink them. Shine a type of light on the hemorrhoids to cause them to fall off. Do surgery to get rid of the hemorrhoids. Follow these instructions at home: Medicines Take over-the-counter and prescription medicines only as told by your doctor. Use creams with medicine in them or medicines that you put in your butt as told by your doctor. Eating and drinking  Eat foods that have a lot of fiber in them. These include whole grains, beans, nuts, fruits, and vegetables. Ask your doctor about taking products that have fiber added to them (fibersupplements). Take in less fat. You can do this by: Eating low-fat dairy products. Eating less red meat. Staying away from processed foods. Drink enough fluid to keep your pee (urine) pale yellow. Managing pain and swelling  Take a  warm-water bath (sitz bath) for 20 minutes to ease pain. Do this 3-4 times a day. You may do this in a bathtub. You may also use a portable sitz bath that fits over the toilet. If told, put ice on the painful area. It may help to use ice between your warm baths. Put ice in a plastic bag. Place a towel between your skin and the bag. Leave the ice on for 20 minutes, 2-3 times a day. If your skin turns bright red, take off the ice right away to prevent skin damage. The risk of damage is higher if you cannot feel pain, heat, or cold. General instructions Exercise. Ask your doctor how much and what kind of exercise is best for you. Go to the bathroom when you need to poop. Do not wait. Try not to push too hard when you poop. Keep your butt dry and clean. Use wet toilet paper or moist towelettes after you poop. Do not sit on the toilet for a long time. Contact a doctor if: You have pain and swelling that do not get better with treatment. You have trouble pooping. You cannot poop. You have pain or swelling outside the area of the hemorrhoids. Get help right away if: You have bleeding from the butt that will not stop. This information is not intended to replace advice given to you by your health care provider. Make sure you discuss any questions you have with your health care provider. Document Revised: 08/15/2022 Document Reviewed: 08/15/2022 Elsevier Patient Education  2024 [employer].

## 2024-07-24 NOTE — Progress Notes (Signed)
 Subjective: CC: Rectal bleeding PCP: Jolinda Norene HERO, DO [name] is a 73 y.o. female presenting to clinic today for:  1.  Rectal bleeding Patient had her last colonoscopy with [facility] back in February of this year.  Showed some polyps and hemorrhoids.  She reports that she started having some rectal bleeding and this occurred over a couple of days.  She describes the bleeding as bright red.  She denies any associated rectal pain or palpable bulges in the rectum.  She does occasionally have rectal itching.  She notes that she had some loose stools when the bleeding was happening but not prior to.  She questions if perhaps her doxycycline  may be contributing.  She takes that for her rosacea.  Not currently being treated with topical.  She is treated with Plavix  but no other antiplatelets or blood thinning type medications including NSAIDs or over-the-counter products like fish oils.  She reports no associated abdominal pain, nausea, vomiting.  No dizziness, shortness of breath or lightheadedness.   ROS: Per HPI  Allergies  Allergen Reactions   Glipizide  Itching   Pravachol  [Pravastatin  Sodium] Other (See Comments)    Stomach pain and constipation    Past Medical History:  Diagnosis Date   Acute CVA (cerebrovascular accident) (HCC) 07/20/2022   Anxiety    Arthritis    of the neck   Colon polyps    Diabetes mellitus without complication (HCC)    Esophageal stricture    Essential hypertension 01/05/2020   GERD (gastroesophageal reflux disease)    Hiatal Hernia   History of uterine cancer    Hyperlipidemia    Hypertension    Occipital stroke (HCC) 02/13/2019   Osteoporosis    Pacemaker    Symptomatic bradycardia    Tingling in extremities 07/20/2022    Current Outpatient Medications:    Blood Glucose Monitoring Suppl (ONE TOUCH ULTRA 2) w/Device KIT, 1 Device by Does not apply route daily., Disp: 1 each, Rfl: 0   calcium  citrate-vitamin D  (CITRACAL+D) 315-200 MG-UNIT  per tablet, Take 1 tablet by mouth daily., Disp: , Rfl:    carboxymethylcellulose (REFRESH PLUS) 0.5 % SOLN, Place 1 drop into both eyes daily as needed (dry eyes)., Disp: , Rfl:    clopidogrel  (PLAVIX ) 75 MG tablet, Take 1 tablet (75 mg total) by mouth daily., Disp: 100 tablet, Rfl: 3   COLLAGEN PO, Take 0.5 Scoops by mouth daily., Disp: , Rfl:    diclofenac  Sodium (VOLTAREN ) 1 % GEL, Apply 4 g topically 4 (four) times daily., Disp: 400 g, Rfl: PRN   doxycycline  (VIBRAMYCIN ) 50 MG capsule, Take 50 mg by mouth daily., Disp: , Rfl:    Evolocumab  (REPATHA  SURECLICK) 140 MG/ML SOAJ, Inject 140 mg into the skin every 14 (fourteen) days., Disp: 6 mL, Rfl: 4   glucose blood (ONETOUCH ULTRA) test strip, CHECK BLOOD SUGAR up to 4 times DAILY OR AS DIRECTED E11.9, Disp: 400 strip, Rfl: 3   Lancets (ONETOUCH DELICA PLUS LANCET33G) MISC, CHECK BLOOD SUGAR up to 4 times DAILY OR AS DIRECTED E11.9, Disp: 400 each, Rfl: 3   nebivolol  (BYSTOLIC ) 5 MG tablet, Take 1 tablet (5 mg total) by mouth 2 (two) times daily., Disp: 200 tablet, Rfl: 3   pantoprazole  (PROTONIX ) 40 MG tablet, Take 1 tablet (40 mg total) by mouth daily., Disp: 100 tablet, Rfl: 3   sertraline  (ZOLOFT ) 50 MG tablet, Take 1 tablet (50 mg total) by mouth daily., Disp: 100 tablet, Rfl: 3 Social History  Socioeconomic History   Marital status: Significant Other    Spouse name: Not on file   Number of children: 1   Years of education: GED   Highest education level: GED or equivalent  Occupational History   Occupation: retired    Comment: Scientist, product/process development  Tobacco Use   Smoking status: Never   Smokeless tobacco: Never  Vaping Use   Vaping status: Never Used  Substance and Sexual Activity   Alcohol use: No    Comment: may have a social drink rarely   Drug use: No   Sexual activity: Not Currently  Other Topics Concern   Not on file  Social History Narrative   Lives with sig other   Social Drivers of Health   Financial Resource  Strain: Low Risk  (12/27/2023)   Overall Financial Resource Strain (CARDIA)    Difficulty of Paying Living Expenses: Not hard at all  Food Insecurity: No Food Insecurity (12/27/2023)   Hunger Vital Sign    Worried About Running Out of Food in the Last Year: Never true    Ran Out of Food in the Last Year: Never true  Transportation Needs: No Transportation Needs (12/27/2023)   PRAPARE - Administrator, Civil Service (Medical): No    Lack of Transportation (Non-Medical): No  Physical Activity: Insufficiently Active (12/27/2023)   Exercise Vital Sign    Days of Exercise per Week: 3 days    Minutes of Exercise per Session: 30 min  Stress: No Stress Concern Present (12/27/2023)   [employer] of Occupational Health - Occupational Stress Questionnaire    Feeling of Stress : Not at all  Social Connections: Moderately Integrated (12/27/2023)   Social Connection and Isolation Panel    Frequency of Communication with Friends and Family: More than three times a week    Frequency of Social Gatherings with Friends and Family: Three times a week    Attends Religious Services: 1 to 4 times per year    Active Member of Clubs or Organizations: No    Attends Banker Meetings: Never    Marital Status: Living with partner  Intimate Partner Violence: Not At Risk (12/27/2023)   Humiliation, Afraid, Rape, and Kick questionnaire    Fear of Current or Ex-Partner: No    Emotionally Abused: No    Physically Abused: No    Sexually Abused: No   Family History  Problem Relation Age of Onset   Heart disease Mother    Heart attack Mother    Heart disease Father    Heart attack Father    Cancer Sister        breast   Breast cancer Sister    COPD Sister    Alcohol abuse Brother    Healthy Daughter     Objective: Office vital signs reviewed. BP (!) 141/71   Pulse 61   Temp 97.8 F (36.6 C)   Ht 5' 4 (1.626 m)   Wt 148 lb (67.1 kg)   SpO2 95%   BMI 25.40 kg/m   Physical  Examination:  General: Awake, alert, well nourished, No acute distress HEENT: No conjunctival pallor.  No rashes appreciated on the face today  Assessment/ Plan: 73 y.o. female   Hematochezia - Plan: Hemoglobin, fingerstick, hydrocortisone  (ANUSOL -HC) 25 MG suppository  Bleeding hemorrhoids - Plan: Hemoglobin, fingerstick, hydrocortisone  (ANUSOL -HC) 25 MG suppository  Rosacea - Plan: metroNIDAZOLE  (METROGEL ) 1 % gel  She declined rectal examination as she is not currently symptomatic.  I  offered fingerstick hemoglobin but she did not feel that this was necessary at this time  I did go ahead and send over some suppositories to use for as needed.  We discussed that if she has recurrence of symptoms or frequent symptoms low threshold to have her see GI for anoscopy plus or minus hemorrhoid banding.  We discussed use of witch hazel pads rectally as needed itching  I switched her to topical MetroGel  for her rosacea.  Okay to hold off on doxycycline  if it is giving her loose stools   Norene CHRISTELLA Fielding, DO [facility] [phone]

## 2024-08-19 ENCOUNTER — Ambulatory Visit (INDEPENDENT_AMBULATORY_CARE_PROVIDER_SITE_OTHER): Payer: PPO

## 2024-08-19 DIAGNOSIS — I495 Sick sinus syndrome: Secondary | ICD-10-CM | POA: Diagnosis not present

## 2024-08-20 DIAGNOSIS — H00021 Hordeolum internum right upper eyelid: Secondary | ICD-10-CM | POA: Diagnosis not present

## 2024-08-20 LAB — CUP PACEART REMOTE DEVICE CHECK
Battery Impedance: 1731 Ohm
Battery Remaining Longevity: 47 mo
Battery Voltage: 2.76 V
Brady Statistic AP VP Percent: 1 %
Brady Statistic AP VS Percent: 5 %
Brady Statistic AS VP Percent: 0 %
Brady Statistic AS VS Percent: 94 %
Date Time Interrogation Session: 20250903101459
Implantable Lead Connection Status: 753985
Implantable Lead Connection Status: 753985
Implantable Lead Implant Date: 20050914
Implantable Lead Implant Date: 20050914
Implantable Lead Location: 753859
Implantable Lead Location: 753860
Implantable Lead Model: 5076
Implantable Lead Model: 5076
Implantable Pulse Generator Implant Date: 20150605
Lead Channel Impedance Value: 512 Ohm
Lead Channel Impedance Value: 524 Ohm
Lead Channel Pacing Threshold Amplitude: 0.625 V
Lead Channel Pacing Threshold Amplitude: 0.75 V
Lead Channel Pacing Threshold Pulse Width: 0.4 ms
Lead Channel Pacing Threshold Pulse Width: 0.4 ms
Lead Channel Setting Pacing Amplitude: 2 V
Lead Channel Setting Pacing Amplitude: 2.5 V
Lead Channel Setting Pacing Pulse Width: 0.4 ms
Lead Channel Setting Sensing Sensitivity: 4 mV
Zone Setting Status: 755011
Zone Setting Status: 755011

## 2024-08-23 ENCOUNTER — Ambulatory Visit: Payer: Self-pay | Admitting: Internal Medicine

## 2024-08-25 ENCOUNTER — Other Ambulatory Visit (HOSPITAL_COMMUNITY)
Admission: RE | Admit: 2024-08-25 | Discharge: 2024-08-25 | Disposition: A | Discharge status: Home / Self Care | Source: Ambulatory Visit | Attending: Obstetrics & Gynecology | Admitting: Obstetrics & Gynecology

## 2024-08-25 ENCOUNTER — Encounter: Payer: Self-pay | Admitting: Obstetrics & Gynecology

## 2024-08-25 ENCOUNTER — Ambulatory Visit: Admitting: Obstetrics & Gynecology

## 2024-08-25 VITALS — BP 161/75 | HR 59 | Ht 64.0 in | Wt 149.8 lb

## 2024-08-25 DIAGNOSIS — N9489 Other specified conditions associated with female genital organs and menstrual cycle: Secondary | ICD-10-CM

## 2024-08-25 DIAGNOSIS — R102 Pelvic and perineal pain: Secondary | ICD-10-CM | POA: Insufficient documentation

## 2024-08-25 DIAGNOSIS — R300 Dysuria: Secondary | ICD-10-CM

## 2024-08-25 DIAGNOSIS — N952 Postmenopausal atrophic vaginitis: Secondary | ICD-10-CM | POA: Diagnosis not present

## 2024-08-25 NOTE — Progress Notes (Signed)
 GYN VISIT Patient name: [name] MRN [id number]  Date of birth: 07-08-1951 Chief Complaint:   Vaginal Itching (Vaginal burning, unsure if it is chaffing or a yeast infection)  History of Present Illness:   [name] is a 73 y.o. G1P1000 PM, PH female being seen today for the following concerns:  -Vaginal irritation: Everytime she voids, she notes burning and itching.  This has been ongoing issue for many years.  Sometimes vaginal discharge- mostly clear.  Sometimes vaginal odor and itching.  Typically up 2-3x per night at void.  She does note occasional urge incontinence, which has just started over the past few weeks.  Denies dysuria or hematuria.  She is concerned that her bladder is falling down as notes difficulty with intercourse  Notes h/o TIA  No LMP recorded. Patient has had a hysterectomy.    Review of Systems:   Pertinent items are noted in HPI Denies fever/chills, dizziness, headaches, visual disturbances, fatigue, shortness of breath, chest pain, abdominal pain, vomiting Pertinent History Reviewed:   Past Surgical History:  Procedure Laterality Date   APPENDECTOMY     CHOLECYSTECTOMY     LAPAROSCOPIC TOTAL HYSTERECTOMY     LYMPH NODE DISSECTION     bilateral pelvic and right-sided periaortic   PACEMAKER GENERATOR CHANGE N/A 05/21/2014   Procedure: PACEMAKER GENERATOR CHANGE;  Surgeon: Danelle LELON Birmingham, MD;  Location: [facility] CATH LAB;  Service: Cardiovascular;  Laterality: N/A;   status post pacemaker     metronic [id number]   TRANSTHORACIC ECHOCARDIOGRAM  08/30/04   TUBAL LIGATION      Past Medical History:  Diagnosis Date   Acute CVA (cerebrovascular accident) (HCC) 07/20/2022   Anxiety    Arthritis    of the neck   Colon polyps    Diabetes mellitus without complication (HCC)    Esophageal stricture    Essential hypertension 01/05/2020   GERD (gastroesophageal reflux disease)    Hiatal Hernia   History of uterine cancer    Hyperlipidemia     Hypertension    Occipital stroke (HCC) 02/13/2019   Osteoporosis    Pacemaker    Symptomatic bradycardia    Tingling in extremities 07/20/2022   Reviewed problem list, medications and allergies. Physical Assessment:   Vitals:   08/25/24 1118 08/25/24 1128  BP: (!) 156/78 (!) 161/75  Pulse: (!) 59   Weight: 149 lb 12.8 oz (67.9 kg)   Height: 5' 4 (1.626 m)   Body mass index is 25.71 kg/m.       Physical Examination:   General appearance: alert, well appearing, and in no distress  Psych: mood appropriate, normal affect  Skin: warm & dry   Cardiovascular: normal heart rate noted  Respiratory: normal respiratory effort, no distress  Abdomen: soft, non-tender   Pelvic: VULVA: normal appearing vulva with no masses, tenderness or lesions, VAGINA: Flat mucosa with narrowed introitus.  No lesions or discharge noted.  No evidence of prolapse appreciated.  Uterus and cervix surgically absent.  No adnexal masses or tenderness appreciated.  Extremities: no edema   Chaperone: Aleck Blase    Assessment & Plan:  1) Vulvar discomfort, vaginal atrophy -Plan to rule out underlying infection including UTI - Based on exam suspect symptoms are due to atrophic changes - Due to history of TIA, concern for vaginal estrogen - Will plan to start with personal moisturizers including Replens - Also recommended water-based lubricants - Plan to follow-up in 6 months []  May consider lower dose course of  estrogen should symptoms persist     Orders Placed This Encounter  Procedures   Urine Culture    Return in about 6 months (around 02/22/2025) for follow up with Dr. Valon Glasscock.   Shashank Kwasnik, DO Attending Obstetrician & Gynecologist, [facility] for [employer], [facility]

## 2024-08-26 ENCOUNTER — Ambulatory Visit: Payer: Self-pay | Admitting: Obstetrics & Gynecology

## 2024-08-26 LAB — CERVICOVAGINAL ANCILLARY ONLY
Bacterial Vaginitis (gardnerella): NEGATIVE
Candida Glabrata: NEGATIVE
Candida Vaginitis: NEGATIVE
Comment: NEGATIVE
Comment: NEGATIVE
Comment: NEGATIVE

## 2024-08-27 LAB — URINE CULTURE

## 2024-08-29 NOTE — Progress Notes (Signed)
 Remote PPM Transmission

## 2024-09-16 NOTE — Progress Notes (Unsigned)
 Subjective: CC:DM PCP: Jolinda Norene HERO, DO YEP:Zczobw [name] is a 73 y.o. female presenting to clinic today for:  Type 2 Diabetes with hypertension, hyperlipidemia w/ microalbuminuria:  Glucometer:***.   ROS: ***dizziness, LOC, polyuria, polydipsia, unintended weight loss/gain, foot ulcerations, numbness or tingling in extremities, shortness of breath or chest pain.   Diabetes Health Maintenance Due  Topic Date Due   OPHTHALMOLOGY EXAM  08/22/2024   HEMOGLOBIN A1C  09/15/2024   FOOT EXAM  03/17/2025    ROS: Per HPI  Allergies  Allergen Reactions   Glipizide  Itching   Pravachol  [Pravastatin  Sodium] Other (See Comments)    Stomach pain and constipation    Past Medical History:  Diagnosis Date   Acute CVA (cerebrovascular accident) (HCC) 07/20/2022   Anxiety    Arthritis    of the neck   Colon polyps    Diabetes mellitus without complication (HCC)    Esophageal stricture    Essential hypertension 01/05/2020   GERD (gastroesophageal reflux disease)    Hiatal Hernia   History of uterine cancer    Hyperlipidemia    Hypertension    Occipital stroke (HCC) 02/13/2019   Osteoporosis    Pacemaker    Symptomatic bradycardia    Tingling in extremities 07/20/2022    Current Outpatient Medications:    Blood Glucose Monitoring Suppl (ONE TOUCH ULTRA 2) w/Device KIT, 1 Device by Does not apply route daily., Disp: 1 each, Rfl: 0   calcium  citrate-vitamin D  (CITRACAL+D) 315-200 MG-UNIT per tablet, Take 1 tablet by mouth daily., Disp: , Rfl:    carboxymethylcellulose (REFRESH PLUS) 0.5 % SOLN, Place 1 drop into both eyes daily as needed (dry eyes)., Disp: , Rfl:    clopidogrel  (PLAVIX ) 75 MG tablet, Take 1 tablet (75 mg total) by mouth daily., Disp: 100 tablet, Rfl: 3   COLLAGEN PO, Take 0.5 Scoops by mouth daily. (Patient not taking: Reported on 08/25/2024), Disp: , Rfl:    diclofenac  Sodium (VOLTAREN ) 1 % GEL, Apply 4 g topically 4 (four) times daily., Disp: 400 g, Rfl: PRN    doxycycline  (VIBRAMYCIN ) 50 MG capsule, Take 50 mg by mouth daily. (Patient not taking: Reported on 08/25/2024), Disp: , Rfl:    Evolocumab  (REPATHA  SURECLICK) 140 MG/ML SOAJ, Inject 140 mg into the skin every 14 (fourteen) days., Disp: 6 mL, Rfl: 4   glucose blood (ONETOUCH ULTRA) test strip, CHECK BLOOD SUGAR up to 4 times DAILY OR AS DIRECTED E11.9, Disp: 400 strip, Rfl: 3   hydrocortisone  (ANUSOL -HC) 25 MG suppository, Place 1 suppository (25 mg total) rectally 2 (two) times daily as needed for hemorrhoids or anal itching., Disp: 12 suppository, Rfl: 0   Lancets (ONETOUCH DELICA PLUS LANCET33G) MISC, CHECK BLOOD SUGAR up to 4 times DAILY OR AS DIRECTED E11.9, Disp: 400 each, Rfl: 3   metroNIDAZOLE  (METROGEL ) 1 % gel, Apply topically daily. For rosacea, Disp: 45 g, Rfl: PRN   nebivolol  (BYSTOLIC ) 5 MG tablet, Take 1 tablet (5 mg total) by mouth 2 (two) times daily., Disp: 200 tablet, Rfl: 3   pantoprazole  (PROTONIX ) 40 MG tablet, Take 1 tablet (40 mg total) by mouth daily., Disp: 100 tablet, Rfl: 3   sertraline  (ZOLOFT ) 50 MG tablet, Take 1 tablet (50 mg total) by mouth daily., Disp: 100 tablet, Rfl: 3 Social History   Socioeconomic History   Marital status: Significant Other    Spouse name: Not on file   Number of children: 1   Years of education: GED   Highest education  level: GED or equivalent  Occupational History   Occupation: retired    Comment: Scientist, product/process development  Tobacco Use   Smoking status: Never   Smokeless tobacco: Never  Vaping Use   Vaping status: Never Used  Substance and Sexual Activity   Alcohol use: No    Comment: may have a social drink rarely   Drug use: No   Sexual activity: Not Currently    Birth control/protection: None  Other Topics Concern   Not on file  Social History Narrative   Lives with sig other   Social Drivers of Health   Financial Resource Strain: Medium Risk (08/25/2024)   Overall Financial Resource Strain (CARDIA)    Difficulty of Paying Living  Expenses: Somewhat hard  Food Insecurity: No Food Insecurity (08/25/2024)   Hunger Vital Sign    Worried About Running Out of Food in the Last Year: Never true    Ran Out of Food in the Last Year: Never true  Transportation Needs: No Transportation Needs (08/25/2024)   PRAPARE - Administrator, Civil Service (Medical): No    Lack of Transportation (Non-Medical): No  Physical Activity: Insufficiently Active (08/25/2024)   Exercise Vital Sign    Days of Exercise per Week: 1 day    Minutes of Exercise per Session: 20 min  Stress: No Stress Concern Present (08/25/2024)   [employer] of Occupational Health - Occupational Stress Questionnaire    Feeling of Stress: Only a little  Social Connections: Moderately Integrated (08/25/2024)   Social Connection and Isolation Panel    Frequency of Communication with Friends and Family: More than three times a week    Frequency of Social Gatherings with Friends and Family: Twice a week    Attends Religious Services: 1 to 4 times per year    Active Member of [employer] or Organizations: No    Attends Banker Meetings: Never    Marital Status: Living with partner  Intimate Partner Violence: Not At Risk (08/25/2024)   Humiliation, Afraid, Rape, and Kick questionnaire    Fear of Current or Ex-Partner: No    Emotionally Abused: No    Physically Abused: No    Sexually Abused: No   Family History  Problem Relation Age of Onset   Heart disease Mother    Heart attack Mother    Heart disease Father    Heart attack Father    Cancer Sister        breast   Breast cancer Sister    COPD Sister    Alcohol abuse Brother    Healthy Daughter     Objective: Office vital signs reviewed. There were no vitals taken for this visit.  Physical Examination:  General: Awake, alert, *** nourished, No acute distress HEENT: Normal    Neck: No masses palpated. No lymphadenopathy    Ears: Tympanic membranes intact, normal light reflex, no erythema,  no bulging    Eyes: PERRLA, extraocular membranes intact, sclera ***    Nose: nasal turbinates moist, *** nasal discharge    Throat: moist mucus membranes, no erythema, *** tonsillar exudate.  Airway is patent Cardio: regular rate and rhythm, S1S2 heard, no murmurs appreciated Pulm: clear to auscultation bilaterally, no wheezes, rhonchi or rales; normal work of breathing on room air GI: soft, non-tender, non-distended, bowel sounds present x4, no hepatomegaly, no splenomegaly, no masses GU: external vaginal tissue ***, cervix ***, *** punctate lesions on cervix appreciated, *** discharge from cervical os, *** bleeding, *** cervical motion  tenderness, *** abdominal/ adnexal masses Extremities: warm, well perfused, No edema, cyanosis or clubbing; +*** pulses bilaterally MSK: *** gait and *** station Skin: dry; intact; no rashes or lesions Neuro: *** Strength and light touch sensation grossly intact, *** DTRs ***/4  Diabetic Foot Exam - Simple   No data filed     Lab Results  Component Value Date   HGBA1C 5.9 (H) 03/16/2024    Assessment/ Plan: 73 y.o. female   Diabetes mellitus treated with oral medication (HCC)  Hyperlipidemia associated with type 2 diabetes mellitus (HCC)  Hypertension associated with diabetes (HCC)  Microalbuminuric diabetic nephropathy (HCC)  ***   [name] Fielding, DO [facility] [phone]

## 2024-09-18 ENCOUNTER — Ambulatory Visit (INDEPENDENT_AMBULATORY_CARE_PROVIDER_SITE_OTHER): Admitting: Family Medicine

## 2024-09-18 ENCOUNTER — Encounter: Payer: Self-pay | Admitting: Family Medicine

## 2024-09-18 VITALS — BP 130/70 | HR 60 | Temp 97.9°F | Ht 64.0 in | Wt 149.1 lb

## 2024-09-18 DIAGNOSIS — E1169 Type 2 diabetes mellitus with other specified complication: Secondary | ICD-10-CM

## 2024-09-18 DIAGNOSIS — Z7984 Long term (current) use of oral hypoglycemic drugs: Secondary | ICD-10-CM | POA: Diagnosis not present

## 2024-09-18 DIAGNOSIS — E1121 Type 2 diabetes mellitus with diabetic nephropathy: Secondary | ICD-10-CM | POA: Diagnosis not present

## 2024-09-18 DIAGNOSIS — E119 Type 2 diabetes mellitus without complications: Secondary | ICD-10-CM | POA: Diagnosis not present

## 2024-09-18 DIAGNOSIS — L570 Actinic keratosis: Secondary | ICD-10-CM

## 2024-09-18 DIAGNOSIS — E1159 Type 2 diabetes mellitus with other circulatory complications: Secondary | ICD-10-CM | POA: Diagnosis not present

## 2024-09-18 DIAGNOSIS — Z23 Encounter for immunization: Secondary | ICD-10-CM

## 2024-09-18 DIAGNOSIS — I152 Hypertension secondary to endocrine disorders: Secondary | ICD-10-CM

## 2024-09-18 DIAGNOSIS — E785 Hyperlipidemia, unspecified: Secondary | ICD-10-CM

## 2024-09-18 LAB — BAYER DCA HB A1C WAIVED: HB A1C (BAYER DCA - WAIVED): 4.9 % (ref 4.8–5.6)

## 2024-09-18 MED ORDER — METFORMIN HCL 500 MG PO TABS: 500.0000 mg | ORAL_TABLET | Freq: Every day | ORAL | 3 refills | Status: AC

## 2024-09-18 NOTE — Patient Instructions (Signed)
 Cryoablation, Care After The following information offers guidance on how to care for yourself after your procedure. Your health care provider may also give you more specific instructions. If you have problems or questions, contact your health care provider. What can I expect after the procedure? After the procedure, it is common to have: Redness or blisters near the area treated. Mild pain and swelling. Follow these instructions at home: Treatment area care  If you have an incision, follow instructions from your health care provider about how to take care of it. Make sure you: Wash your hands with soap and water  for at least 20 seconds before and after you change your bandage (dressing). If soap and water  are not available, use hand sanitizer. Change your dressing as told by your health care provider. Leave stitches (sutures), skin glue, or adhesive strips in place. These skin closures may need to stay in place for 2 weeks or longer. If adhesive strip edges start to loosen and curl up, you may trim the loose edges. Do not remove adhesive strips completely unless your health care provider tells you to do that. Check your treatment area every day for signs of infection. Check for: More redness, swelling, or pain. Fluid or blood. Warmth. Pus or a bad smell. Keep the treated area clean and dry. Keep it covered with a dressing until it has healed. Clean the area with soap and water  as told by your health care provider. If your dressing gets wet, change it right away. Activity  Follow instructions from your health care provider about what activities are safe for you. You may have to avoid lifting. Ask your health care provider how much you can safely lift. If you were given a sedative during the procedure, it can affect you for several hours. Do not drive or operate machinery until your health care provider says that it is safe. General instructions Take over-the-counter and prescription  medicines only as told by your health care provider. Do not use any products that contain nicotine or tobacco. These products include cigarettes, chewing tobacco, and vaping devices, such as e-cigarettes. These can delay incision healing. If you need help quitting, ask your health care provider. Do not take baths, swim, or use a hot tub until your health care provider approves. Ask your health care provider if you may take showers. You may only be allowed to take sponge baths. Keep all follow-up visits. Your health care provider may need to check that treatment worked and that there were no problems caused by the procedure. Contact a health care provider if: You have more pain. You have a fever. You have nausea or vomiting. You have any signs of infection. You do not have a bowel movement for 2 days. You cannot urinate, or you cannot control when you urinate or have a bowel movement (have incontinence). You develop impotence. Get help right away if: You have severe pain. You have trouble swallowing or breathing. You are very weak or dizzy. You have chest pain or shortness of breath. These symptoms may be an emergency. Get help right away. Call 911. Do not wait to see if the symptoms will go away. Do not drive yourself to the hospital. This information is not intended to replace advice given to you by your health care provider. Make sure you discuss any questions you have with your health care provider. Document Revised: 05/18/2022 Document Reviewed: 05/18/2022 Elsevier Patient Education  2024 [employer].

## 2024-09-19 LAB — MICROALBUMIN / CREATININE URINE RATIO
Creatinine, Urine: 181.4 mg/dL
Microalb/Creat Ratio: 51 mg/g{creat} — ABNORMAL HIGH (ref 0–29)
Microalbumin, Urine: 93.3 ug/mL

## 2024-10-19 DIAGNOSIS — I789 Disease of capillaries, unspecified: Secondary | ICD-10-CM | POA: Diagnosis not present

## 2024-10-19 DIAGNOSIS — L719 Rosacea, unspecified: Secondary | ICD-10-CM | POA: Diagnosis not present

## 2024-10-19 DIAGNOSIS — L814 Other melanin hyperpigmentation: Secondary | ICD-10-CM | POA: Diagnosis not present

## 2024-10-19 DIAGNOSIS — L570 Actinic keratosis: Secondary | ICD-10-CM | POA: Diagnosis not present

## 2024-10-23 ENCOUNTER — Encounter: Payer: Self-pay | Admitting: *Deleted

## 2024-11-18 ENCOUNTER — Ambulatory Visit: Payer: PPO

## 2024-11-18 DIAGNOSIS — I495 Sick sinus syndrome: Secondary | ICD-10-CM | POA: Diagnosis not present

## 2024-11-19 LAB — CUP PACEART REMOTE DEVICE CHECK
Battery Impedance: 1728 Ohm
Battery Remaining Longevity: 47 mo
Battery Voltage: 2.75 V
Brady Statistic AP VP Percent: 1 %
Brady Statistic AP VS Percent: 5 %
Brady Statistic AS VP Percent: 0 %
Brady Statistic AS VS Percent: 94 %
Date Time Interrogation Session: 20251203113025
Implantable Lead Connection Status: 753985
Implantable Lead Connection Status: 753985
Implantable Lead Implant Date: 20050914
Implantable Lead Implant Date: 20050914
Implantable Lead Location: 753859
Implantable Lead Location: 753860
Implantable Lead Model: 5076
Implantable Lead Model: 5076
Implantable Pulse Generator Implant Date: 20150605
Lead Channel Impedance Value: 505 Ohm
Lead Channel Impedance Value: 506 Ohm
Lead Channel Pacing Threshold Amplitude: 0.5 V
Lead Channel Pacing Threshold Amplitude: 0.75 V
Lead Channel Pacing Threshold Pulse Width: 0.4 ms
Lead Channel Pacing Threshold Pulse Width: 0.4 ms
Lead Channel Setting Pacing Amplitude: 2 V
Lead Channel Setting Pacing Amplitude: 2.5 V
Lead Channel Setting Pacing Pulse Width: 0.4 ms
Lead Channel Setting Sensing Sensitivity: 4 mV
Zone Setting Status: 755011
Zone Setting Status: 755011

## 2024-11-22 ENCOUNTER — Ambulatory Visit: Payer: Self-pay | Admitting: Internal Medicine

## 2024-11-24 NOTE — Progress Notes (Signed)
 Remote PPM Transmission

## 2024-12-28 ENCOUNTER — Ambulatory Visit: Payer: PPO

## 2024-12-28 VITALS — BP 130/70 | HR 69 | Ht 64.0 in | Wt 158.0 lb

## 2024-12-28 DIAGNOSIS — Z0000 Encounter for general adult medical examination without abnormal findings: Secondary | ICD-10-CM

## 2024-12-28 DIAGNOSIS — Z13820 Encounter for screening for osteoporosis: Secondary | ICD-10-CM

## 2024-12-28 NOTE — Progress Notes (Signed)
 "  Chief Complaint  Patient presents with   Medicare Wellness     Subjective:   KENT BRAUNSCHWEIG is a 74 y.o. female who presents for a Medicare Annual Wellness Visit.  Visit info / Clinical Intake: Medicare Wellness Visit Type:: Subsequent Annual Wellness Visit Persons participating in visit and providing information:: patient Medicare Wellness Visit Mode:: Telephone If telephone:: video declined Since this visit was completed virtually, some vitals may be partially provided or unavailable. Missing vitals are due to the limitations of the virtual format.: Unable to obtain vitals - no equipment If Telephone or Video please confirm:: I connected with patient using audio/video enable telemedicine. I verified patient identity with two identifiers, discussed telehealth limitations, and patient agreed to proceed. Patient Location:: home Provider Location:: office Interpreter Needed?: No Pre-visit prep was completed: yes AWV questionnaire completed by patient prior to visit?: no Living arrangements:: other (companion) Patient's Overall Health Status Rating: very good Typical amount of pain: none Does pain affect daily life?: no Are you currently prescribed opioids?: no  Dietary Habits and Nutritional Risks How many meals a day?: 3 Eats fruit and vegetables daily?: yes Most meals are obtained by: preparing own meals In the last 2 weeks, have you had any of the following?: none Diabetic:: (!) yes Any non-healing wounds?: no How often do you check your BS?: 0 Would you like to be referred to a Nutritionist or for Diabetic Management? : no  Functional Status Activities of Daily Living (to include ambulation/medication): Independent Ambulation: Independent Medication Administration: Independent Home Management (perform basic housework or laundry): Independent Manage your own finances?: yes Primary transportation is: driving Concerns about vision?: no *vision screening is required for  WTM* (last ov 2025 w/Dr. Jama) Concerns about hearing?: (!) yes Uses hearing aids?: (!) yes  Fall Screening Falls in the past year?: 0 Number of falls in past year: 0 Was there an injury with Fall?: 0 Fall Risk Category Calculator: 0 Patient Fall Risk Level: Low Fall Risk  Fall Risk Patient at Risk for Falls Due to: No Fall Risks Fall risk Follow up: Falls evaluation completed; Education provided  Home and Transportation Safety: All rugs have non-skid backing?: yes All stairs or steps have railings?: yes Grab bars in the bathtub or shower?: yes Have non-skid surface in bathtub or shower?: yes Good home lighting?: yes Regular seat belt use?: yes Hospital stays in the last year:: no  Cognitive Assessment Difficulty concentrating, remembering, or making decisions? : yes Will 6CIT or Mini Cog be Completed: yes What year is it?: 0 points What month is it?: 0 points Give patient an address phrase to remember (5 components): [address] [place] About what time is it?: 0 points Count backwards from 20 to 1: 0 points Say the months of the year in reverse: 0 points Repeat the address phrase from earlier: 0 points 6 CIT Score: 0 points  Advance Directives (For Healthcare) Does Patient Have a Medical Advance Directive?: No Type of Advance Directive: Healthcare Power of [place]; Living will Would patient like information on creating a medical advance directive?: No - Patient declined  Reviewed/Updated  Reviewed/Updated: Reviewed All (Medical, Surgical, Family, Medications, Allergies, Care Teams, Patient Goals); Medical History; Surgical History; Family History; Medications; Allergies; Care Teams; Patient Goals    Allergies (verified) Glipizide  and Pravachol  [pravastatin  sodium]   Current Medications (verified) Outpatient Encounter Medications as of 12/28/2024  Medication Sig   Blood Glucose Monitoring Suppl (ONE TOUCH ULTRA 2) w/Device KIT 1 Device by Does  not apply  route daily.   calcium  citrate-vitamin D  (CITRACAL+D) 315-200 MG-UNIT per tablet Take 1 tablet by mouth daily.   carboxymethylcellulose (REFRESH PLUS) 0.5 % SOLN Place 1 drop into both eyes daily as needed (dry eyes).   clopidogrel  (PLAVIX ) 75 MG tablet Take 1 tablet (75 mg total) by mouth daily.   diclofenac  Sodium (VOLTAREN ) 1 % GEL Apply 4 g topically 4 (four) times daily.   doxycycline  (VIBRAMYCIN ) 50 MG capsule Take 50 mg by mouth daily.   Evolocumab  (REPATHA  SURECLICK) 140 MG/ML SOAJ Inject 140 mg into the skin every 14 (fourteen) days.   glucose blood (ONETOUCH ULTRA) test strip CHECK BLOOD SUGAR up to 4 times DAILY OR AS DIRECTED E11.9   hydrocortisone  (ANUSOL -HC) 25 MG suppository Place 1 suppository (25 mg total) rectally 2 (two) times daily as needed for hemorrhoids or anal itching.   Lancets (ONETOUCH DELICA PLUS LANCET33G) MISC CHECK BLOOD SUGAR up to 4 times DAILY OR AS DIRECTED E11.9   metroNIDAZOLE  (METROGEL ) 1 % gel Apply topically daily. For rosacea   nebivolol  (BYSTOLIC ) 5 MG tablet Take 1 tablet (5 mg total) by mouth 2 (two) times daily.   pantoprazole  (PROTONIX ) 40 MG tablet Take 1 tablet (40 mg total) by mouth daily.   sertraline  (ZOLOFT ) 50 MG tablet Take 1 tablet (50 mg total) by mouth daily.   COLLAGEN PO Take 0.5 Scoops by mouth daily. (Patient not taking: Reported on 12/28/2024)   metFORMIN  (GLUCOPHAGE ) 500 MG tablet Take 1 tablet (500 mg total) by mouth daily with breakfast. (Patient not taking: Reported on 12/28/2024)   No facility-administered encounter medications on file as of 12/28/2024.    History: Past Medical History:  Diagnosis Date   Acute CVA (cerebrovascular accident) (HCC) 07/20/2022   Anxiety    Arthritis    of the neck   Colon polyps    Diabetes mellitus without complication (HCC)    Esophageal stricture    Essential hypertension 01/05/2020   GERD (gastroesophageal reflux disease)    Hiatal Hernia   History of uterine cancer    Hyperlipidemia     Hypertension    Occipital stroke (HCC) 02/13/2019   Osteoporosis    Pacemaker    Rosacea    Symptomatic bradycardia    Tingling in extremities 07/20/2022   Past Surgical History:  Procedure Laterality Date   APPENDECTOMY     CHOLECYSTECTOMY     LAPAROSCOPIC TOTAL HYSTERECTOMY     LYMPH NODE DISSECTION     bilateral pelvic and right-sided periaortic   PACEMAKER GENERATOR CHANGE N/A 05/21/2014   Procedure: PACEMAKER GENERATOR CHANGE;  Surgeon: Danelle LELON [place], MD;  Location: [facility] CATH LAB;  Service: Cardiovascular;  Laterality: N/A;   status post pacemaker     metronic [id number]   TRANSTHORACIC ECHOCARDIOGRAM  08/30/04   TUBAL LIGATION     Family History  Problem Relation Age of Onset   Heart disease Mother    Heart attack Mother    Heart disease Father    Heart attack Father    Cancer Sister        breast   Breast cancer Sister    COPD Sister    Alcohol abuse Brother    Healthy Daughter    Social History   Occupational History   Occupation: retired    Comment: scientist, product/process development  Tobacco Use   Smoking status: Never   Smokeless tobacco: Never  Vaping Use   Vaping status: Never Used  Substance and Sexual Activity  Alcohol use: No    Comment: may have a social drink rarely   Drug use: No   Sexual activity: Not Currently    Birth control/protection: None   Tobacco Counseling Counseling given: Not Answered  SDOH Screenings   Food Insecurity: No Food Insecurity (08/25/2024)  Housing: Low Risk (12/28/2024)  Transportation Needs: No Transportation Needs (12/28/2024)  Utilities: Not At Risk (12/28/2024)  Alcohol Screen: Low Risk (08/25/2024)  Depression (PHQ2-9): Low Risk (12/28/2024)  Financial Resource Strain: Medium Risk (08/25/2024)  Physical Activity: Insufficiently Active (12/28/2024)  Social Connections: Moderately Integrated (12/28/2024)  Stress: No Stress Concern Present (12/28/2024)  Tobacco Use: Low Risk (12/28/2024)  Health Literacy: Adequate Health Literacy  (12/28/2024)   See flowsheets for full screening details  Depression Screen PHQ 2 & 9 Depression Scale- Over the past 2 weeks, how often have you been bothered by any of the following problems? Little interest or pleasure in doing things: 0 Feeling down, depressed, or hopeless (PHQ Adolescent also includes...irritable): 1 PHQ-2 Total Score: 1 Trouble falling or staying asleep, or sleeping too much: 3 Feeling tired or having little energy: 3 Poor appetite or overeating (PHQ Adolescent also includes...weight loss): 0 Feeling bad about yourself - or that you are a failure or have let yourself or your family down: 0 Trouble concentrating on things, such as reading the newspaper or watching television (PHQ Adolescent also includes...like school work): 0 Moving or speaking so slowly that other people could have noticed. Or the opposite - being so fidgety or restless that you have been moving around a lot more than usual: 0 Thoughts that you would be better off dead, or of hurting yourself in some way: 0 PHQ-9 Total Score: 6 If you checked off any problems, how difficult have these problems made it for you to do your work, take care of things at home, or get along with other people?: Somewhat difficult  Depression Treatment Depression Interventions/Treatment : Counseling     Goals Addressed   None          Objective:    There were no vitals filed for this visit. There is no height or weight on file to calculate BMI.  Hearing/Vision screen No results found. Immunizations and Health Maintenance Health Maintenance  Topic Date Due   COVID-19 Vaccine (3 - Moderna risk series) 02/15/2021   OPHTHALMOLOGY EXAM  08/22/2024   Bone Density Scan  10/24/2024   Mammogram  03/11/2025   Diabetic kidney evaluation - eGFR measurement  03/16/2025   FOOT EXAM  03/17/2025   HEMOGLOBIN A1C  03/19/2025   Diabetic kidney evaluation - Urine ACR  09/18/2025   Medicare Annual Wellness (AWV)  12/28/2025    Colonoscopy  01/27/2029   DTaP/Tdap/Td (3 - Td or Tdap) 03/17/2034   Pneumococcal Vaccine: 50+ Years  Completed   Influenza Vaccine  Completed   Hepatitis C Screening  Completed   Zoster Vaccines- Shingrix   Completed   Meningococcal B Vaccine  Aged Out        Assessment/Plan:  This is a routine wellness examination for Denelda.  Patient Care Team: Jolinda Norene HERO, DO as PCP - General (Family Medicine) Waddell Danelle ORN, MD as PCP - Electrophysiology (Cardiology) Ladora Ross Lacy Phebe, MD as Referring Physician (Optometry) Rosalie Kitchens, MD as Consulting Physician (Gastroenterology)  I have personally reviewed and noted the following in the patients chart:   Medical and social history Use of alcohol, tobacco or illicit drugs  Current medications and supplements including opioid prescriptions. Functional  ability and status Nutritional status Physical activity Advanced directives List of other physicians Hospitalizations, surgeries, and ER visits in previous 12 months Vitals Screenings to include cognitive, depression, and falls Referrals and appointments  No orders of the defined types were placed in this encounter.  In addition, I have reviewed and discussed with patient certain preventive protocols, quality metrics, and best practice recommendations. A written personalized care plan for preventive services as well as general preventive health recommendations were provided to patient.   Ozie Ned, CMA   12/28/2024   Return in 1 year (on 12/28/2025).  After Visit Summary: (MyChart) Due to this being a telephonic visit, the after visit summary with patients personalized plan was offered to patient via MyChart   Nurse Notes: pt had eye exam w/Dr. Jama, will request for report, Dexa scan ordered place, aware and due Covid vaccines. Pt concerns were msg to pcp  "

## 2024-12-28 NOTE — Patient Instructions (Signed)
 [name],  Thank you for taking the time for your Medicare Wellness Visit. I appreciate your continued commitment to your health goals. Please review the care plan we discussed, and feel free to reach out if I can assist you further.  Please note that Annual Wellness Visits do not include a physical exam. Some assessments may be limited, especially if the visit was conducted virtually. If needed, we may recommend an in-person follow-up with your provider.  Ongoing Care Seeing your primary care provider every 3 to 6 months helps us  monitor your health and provide consistent, personalized care.   Referrals If a referral was made during today's visit and you haven't received any updates within two weeks, please contact the referred provider directly to check on the status.  Recommended Screenings:  Health Maintenance  Topic Date Due   COVID-19 Vaccine (3 - Moderna risk series) 02/15/2021   Eye exam for diabetics  08/22/2024   Osteoporosis screening with Bone Density Scan  10/24/2024   Medicare Annual Wellness Visit  12/26/2024   Breast Cancer Screening  03/11/2025   Yearly kidney function blood test for diabetes  03/16/2025   Complete foot exam   03/17/2025   Hemoglobin A1C  03/19/2025   Yearly kidney health urinalysis for diabetes  09/18/2025   Colon Cancer Screening  01/27/2029   DTaP/Tdap/Td vaccine (3 - Td or Tdap) 03/17/2034   Pneumococcal Vaccine for age over 94  Completed   Flu Shot  Completed   Hepatitis C Screening  Completed   Zoster (Shingles) Vaccine  Completed   Meningitis B Vaccine  Aged Out       12/28/2024   12:00 PM  Advanced Directives  Does Patient Have a Medical Advance Directive? No  Would patient like information on creating a medical advance directive? No - Patient declined    Vision: Annual vision screenings are recommended for early detection of glaucoma, cataracts, and diabetic retinopathy. These exams can also reveal signs of chronic conditions such as  diabetes and high blood pressure.  Dental: Annual dental screenings help detect early signs of oral cancer, gum disease, and other conditions linked to overall health, including heart disease and diabetes.  Please see the attached documents for additional preventive care recommendations.

## 2025-01-18 ENCOUNTER — Other Ambulatory Visit (HOSPITAL_COMMUNITY): Payer: Self-pay

## 2025-01-18 ENCOUNTER — Telehealth: Payer: Self-pay | Admitting: Pharmacy Technician

## 2025-01-18 NOTE — Telephone Encounter (Signed)
 Pharmacy Patient Advocate Encounter  Received notification from [facility] [employer] that Prior Authorization for Repatha  SureClick 140MG /ML auto-injectors has been APPROVED from 01/18/2025 to 01/18/2026. Ran test claim, Copay is $112.07. This test claim was processed through [facility]- copay amounts may vary at other pharmacies due to pharmacy/plan contracts, or as the patient moves through the different stages of their insurance plan.   PA #/Case ID/Reference #: [id number]

## 2025-01-18 NOTE — Telephone Encounter (Signed)
 Pharmacy Patient Advocate Encounter   Received notification from [employer] that prior authorization for Repatha  SureClick 140MG /ML auto-injectors is required/requested.   Insurance verification completed.   The patient is insured through [facility] ADVANTAGE/RX ADVANCE.   Per test claim: PA required; PA submitted to above mentioned insurance via Latent Key/confirmation #/EOC [id number] Status is pending

## 2025-02-17 ENCOUNTER — Ambulatory Visit: Payer: PPO

## 2025-03-17 ENCOUNTER — Other Ambulatory Visit: Payer: Self-pay

## 2025-03-19 ENCOUNTER — Encounter: Payer: Self-pay | Admitting: Family Medicine

## 2025-03-22 ENCOUNTER — Encounter: Admitting: Family Medicine

## 2025-03-29 ENCOUNTER — Ambulatory Visit: Admitting: Student

## 2025-04-02 ENCOUNTER — Encounter: Admitting: Family Medicine

## 2025-04-02 ENCOUNTER — Other Ambulatory Visit

## 2025-05-19 ENCOUNTER — Ambulatory Visit: Payer: PPO

## 2025-12-29 ENCOUNTER — Ambulatory Visit
